# Patient Record
Sex: Female | Born: 1940 | Race: White | Hispanic: No | Marital: Married | Smoking: Former smoker
Health system: Southern US, Community
[De-identification: ages and names within clinical notes are randomized; demographics above are authoritative.]

## PROBLEM LIST (undated history)

## (undated) DIAGNOSIS — Z85828 Personal history of other malignant neoplasm of skin: Secondary | ICD-10-CM

## (undated) DIAGNOSIS — R51 Headache: Principal | ICD-10-CM

## (undated) DIAGNOSIS — K589 Irritable bowel syndrome without diarrhea: Secondary | ICD-10-CM

## (undated) DIAGNOSIS — Z8673 Personal history of transient ischemic attack (TIA), and cerebral infarction without residual deficits: Secondary | ICD-10-CM

## (undated) DIAGNOSIS — M1990 Unspecified osteoarthritis, unspecified site: Secondary | ICD-10-CM

## (undated) DIAGNOSIS — J310 Chronic rhinitis: Secondary | ICD-10-CM

## (undated) DIAGNOSIS — K5730 Diverticulosis of large intestine without perforation or abscess without bleeding: Secondary | ICD-10-CM

## (undated) DIAGNOSIS — H9190 Unspecified hearing loss, unspecified ear: Secondary | ICD-10-CM

## (undated) DIAGNOSIS — M479 Spondylosis, unspecified: Secondary | ICD-10-CM

## (undated) DIAGNOSIS — J45909 Unspecified asthma, uncomplicated: Secondary | ICD-10-CM

## (undated) DIAGNOSIS — J3089 Other allergic rhinitis: Secondary | ICD-10-CM

## (undated) DIAGNOSIS — IMO0002 Reserved for concepts with insufficient information to code with codable children: Secondary | ICD-10-CM

## (undated) DIAGNOSIS — J329 Chronic sinusitis, unspecified: Secondary | ICD-10-CM

## (undated) DIAGNOSIS — I10 Essential (primary) hypertension: Secondary | ICD-10-CM

## (undated) DIAGNOSIS — Z87442 Personal history of urinary calculi: Secondary | ICD-10-CM

## (undated) DIAGNOSIS — J302 Other seasonal allergic rhinitis: Secondary | ICD-10-CM

## (undated) DIAGNOSIS — I680 Cerebral amyloid angiopathy: Secondary | ICD-10-CM

## (undated) DIAGNOSIS — I6529 Occlusion and stenosis of unspecified carotid artery: Secondary | ICD-10-CM

## (undated) DIAGNOSIS — N201 Calculus of ureter: Secondary | ICD-10-CM

## (undated) DIAGNOSIS — R350 Frequency of micturition: Secondary | ICD-10-CM

## (undated) DIAGNOSIS — F329 Major depressive disorder, single episode, unspecified: Secondary | ICD-10-CM

## (undated) DIAGNOSIS — B029 Zoster without complications: Secondary | ICD-10-CM

## (undated) DIAGNOSIS — F32A Depression, unspecified: Secondary | ICD-10-CM

## (undated) DIAGNOSIS — H409 Unspecified glaucoma: Secondary | ICD-10-CM

## (undated) DIAGNOSIS — R413 Other amnesia: Secondary | ICD-10-CM

## (undated) HISTORY — DX: Unspecified osteoarthritis, unspecified site: M19.90

## (undated) HISTORY — DX: Essential (primary) hypertension: I10

## (undated) HISTORY — DX: Other amnesia: R41.3

## (undated) HISTORY — DX: Depression, unspecified: F32.A

## (undated) HISTORY — DX: Chronic rhinitis: J31.0

## (undated) HISTORY — DX: Chronic sinusitis, unspecified: J32.9

## (undated) HISTORY — DX: Unspecified hearing loss, unspecified ear: H91.90

## (undated) HISTORY — DX: Cerebral amyloid angiopathy: I68.0

## (undated) HISTORY — PX: BUNIONECTOMY: SHX129

## (undated) HISTORY — PX: TONSILECTOMY, ADENOIDECTOMY, BILATERAL MYRINGOTOMY AND TUBES: SHX2538

## (undated) HISTORY — PX: EXTRACORPOREAL SHOCK WAVE LITHOTRIPSY: SHX1557

## (undated) HISTORY — PX: CALDWELL LUC: SHX1284

## (undated) HISTORY — DX: Irritable bowel syndrome, unspecified: K58.9

## (undated) HISTORY — DX: Major depressive disorder, single episode, unspecified: F32.9

## (undated) HISTORY — DX: Headache: R51

## (undated) HISTORY — PX: OTHER SURGICAL HISTORY: SHX169

## (undated) HISTORY — PX: HERNIA REPAIR: SHX51

## (undated) HISTORY — PX: EYE SURGERY: SHX253

---

## 1973-09-16 HISTORY — PX: PERCUTANEOUS NEPHROSTOLITHOTOMY: SHX2207

## 1989-09-16 HISTORY — PX: VAGINAL HYSTERECTOMY: SUR661

## 1998-01-23 ENCOUNTER — Ambulatory Visit (HOSPITAL_COMMUNITY): Admission: RE | Admit: 1998-01-23 | Discharge: 1998-01-23 | Payer: Self-pay | Admitting: Urology

## 1998-02-16 ENCOUNTER — Ambulatory Visit (HOSPITAL_COMMUNITY): Admission: RE | Admit: 1998-02-16 | Discharge: 1998-02-16 | Payer: Self-pay | Admitting: Urology

## 2000-12-24 ENCOUNTER — Encounter: Payer: Self-pay | Admitting: *Deleted

## 2000-12-24 ENCOUNTER — Encounter (INDEPENDENT_AMBULATORY_CARE_PROVIDER_SITE_OTHER): Payer: Self-pay | Admitting: Specialist

## 2000-12-24 ENCOUNTER — Ambulatory Visit (HOSPITAL_BASED_OUTPATIENT_CLINIC_OR_DEPARTMENT_OTHER): Admission: RE | Admit: 2000-12-24 | Discharge: 2000-12-24 | Payer: Self-pay | Admitting: Urology

## 2001-06-09 ENCOUNTER — Ambulatory Visit (HOSPITAL_BASED_OUTPATIENT_CLINIC_OR_DEPARTMENT_OTHER): Admission: RE | Admit: 2001-06-09 | Discharge: 2001-06-09 | Payer: Self-pay | Admitting: Urology

## 2001-06-09 ENCOUNTER — Encounter: Payer: Self-pay | Admitting: Urology

## 2001-09-23 ENCOUNTER — Encounter: Admission: RE | Admit: 2001-09-23 | Discharge: 2001-09-23 | Payer: Self-pay | Admitting: Family Medicine

## 2001-09-23 ENCOUNTER — Encounter: Payer: Self-pay | Admitting: Family Medicine

## 2002-01-20 ENCOUNTER — Ambulatory Visit (HOSPITAL_COMMUNITY): Admission: RE | Admit: 2002-01-20 | Discharge: 2002-01-20 | Payer: Self-pay | Admitting: Gastroenterology

## 2002-01-20 ENCOUNTER — Encounter (INDEPENDENT_AMBULATORY_CARE_PROVIDER_SITE_OTHER): Payer: Self-pay | Admitting: Specialist

## 2002-09-30 ENCOUNTER — Encounter: Payer: Self-pay | Admitting: Neurological Surgery

## 2002-09-30 ENCOUNTER — Inpatient Hospital Stay (HOSPITAL_COMMUNITY): Admission: RE | Admit: 2002-09-30 | Discharge: 2002-10-02 | Payer: Self-pay | Admitting: Neurological Surgery

## 2002-09-30 HISTORY — PX: LUMBAR LAMINECTOMY: SHX95

## 2002-10-02 ENCOUNTER — Emergency Department (HOSPITAL_COMMUNITY): Admission: EM | Admit: 2002-10-02 | Discharge: 2002-10-03 | Payer: Self-pay | Admitting: Emergency Medicine

## 2002-10-03 ENCOUNTER — Encounter: Payer: Self-pay | Admitting: Neurosurgery

## 2003-02-09 ENCOUNTER — Observation Stay (HOSPITAL_COMMUNITY): Admission: AD | Admit: 2003-02-09 | Discharge: 2003-02-10 | Payer: Self-pay | Admitting: Urology

## 2003-02-09 HISTORY — PX: OTHER SURGICAL HISTORY: SHX169

## 2003-06-03 ENCOUNTER — Other Ambulatory Visit: Admission: RE | Admit: 2003-06-03 | Discharge: 2003-06-03 | Payer: Self-pay | Admitting: Family Medicine

## 2004-05-29 ENCOUNTER — Ambulatory Visit (HOSPITAL_BASED_OUTPATIENT_CLINIC_OR_DEPARTMENT_OTHER): Admission: RE | Admit: 2004-05-29 | Discharge: 2004-05-29 | Payer: Self-pay | Admitting: Urology

## 2004-09-12 ENCOUNTER — Ambulatory Visit (HOSPITAL_COMMUNITY): Admission: RE | Admit: 2004-09-12 | Discharge: 2004-09-12 | Payer: Self-pay | Admitting: Urology

## 2005-01-10 ENCOUNTER — Encounter: Admission: RE | Admit: 2005-01-10 | Discharge: 2005-01-10 | Payer: Self-pay | Admitting: Internal Medicine

## 2005-01-12 ENCOUNTER — Observation Stay (HOSPITAL_COMMUNITY): Admission: RE | Admit: 2005-01-12 | Discharge: 2005-01-12 | Payer: Self-pay | Admitting: General Surgery

## 2005-01-12 ENCOUNTER — Encounter (INDEPENDENT_AMBULATORY_CARE_PROVIDER_SITE_OTHER): Payer: Self-pay | Admitting: *Deleted

## 2005-01-12 HISTORY — PX: LAPAROSCOPIC CHOLECYSTECTOMY: SUR755

## 2005-02-06 ENCOUNTER — Encounter: Admission: RE | Admit: 2005-02-06 | Discharge: 2005-02-06 | Payer: Self-pay | Admitting: Internal Medicine

## 2005-02-15 ENCOUNTER — Encounter: Admission: RE | Admit: 2005-02-15 | Discharge: 2005-02-15 | Payer: Self-pay | Admitting: Internal Medicine

## 2005-06-25 ENCOUNTER — Ambulatory Visit (HOSPITAL_BASED_OUTPATIENT_CLINIC_OR_DEPARTMENT_OTHER): Admission: RE | Admit: 2005-06-25 | Discharge: 2005-06-25 | Payer: Self-pay | Admitting: Urology

## 2005-06-25 ENCOUNTER — Ambulatory Visit (HOSPITAL_COMMUNITY): Admission: RE | Admit: 2005-06-25 | Discharge: 2005-06-25 | Payer: Self-pay | Admitting: Urology

## 2005-08-30 ENCOUNTER — Ambulatory Visit: Payer: Self-pay | Admitting: Internal Medicine

## 2005-10-21 ENCOUNTER — Ambulatory Visit: Payer: Self-pay | Admitting: Internal Medicine

## 2006-01-11 ENCOUNTER — Emergency Department (HOSPITAL_COMMUNITY): Admission: EM | Admit: 2006-01-11 | Discharge: 2006-01-12 | Payer: Self-pay | Admitting: Emergency Medicine

## 2006-03-04 ENCOUNTER — Encounter: Admission: RE | Admit: 2006-03-04 | Discharge: 2006-03-04 | Payer: Self-pay | Admitting: Family Medicine

## 2006-05-13 ENCOUNTER — Ambulatory Visit (HOSPITAL_BASED_OUTPATIENT_CLINIC_OR_DEPARTMENT_OTHER): Admission: RE | Admit: 2006-05-13 | Discharge: 2006-05-13 | Payer: Self-pay | Admitting: Urology

## 2006-06-17 ENCOUNTER — Ambulatory Visit (HOSPITAL_COMMUNITY): Admission: RE | Admit: 2006-06-17 | Discharge: 2006-06-17 | Payer: Self-pay | Admitting: Family Medicine

## 2006-09-17 ENCOUNTER — Ambulatory Visit: Payer: Self-pay | Admitting: Internal Medicine

## 2006-09-19 ENCOUNTER — Ambulatory Visit: Payer: Self-pay | Admitting: Internal Medicine

## 2007-03-12 ENCOUNTER — Ambulatory Visit (HOSPITAL_BASED_OUTPATIENT_CLINIC_OR_DEPARTMENT_OTHER): Admission: RE | Admit: 2007-03-12 | Discharge: 2007-03-12 | Payer: Self-pay | Admitting: Urology

## 2007-04-27 ENCOUNTER — Encounter: Admission: RE | Admit: 2007-04-27 | Discharge: 2007-04-27 | Payer: Self-pay | Admitting: Family Medicine

## 2007-06-02 ENCOUNTER — Ambulatory Visit (HOSPITAL_BASED_OUTPATIENT_CLINIC_OR_DEPARTMENT_OTHER): Admission: RE | Admit: 2007-06-02 | Discharge: 2007-06-02 | Payer: Self-pay | Admitting: Urology

## 2007-06-02 HISTORY — PX: OTHER SURGICAL HISTORY: SHX169

## 2007-08-04 ENCOUNTER — Ambulatory Visit: Payer: Self-pay | Admitting: Internal Medicine

## 2007-08-12 ENCOUNTER — Telehealth: Payer: Self-pay | Admitting: Internal Medicine

## 2007-09-03 DIAGNOSIS — J209 Acute bronchitis, unspecified: Secondary | ICD-10-CM | POA: Insufficient documentation

## 2007-09-03 DIAGNOSIS — J3089 Other allergic rhinitis: Secondary | ICD-10-CM

## 2007-09-03 DIAGNOSIS — J302 Other seasonal allergic rhinitis: Secondary | ICD-10-CM

## 2007-09-04 ENCOUNTER — Ambulatory Visit: Payer: Self-pay | Admitting: Internal Medicine

## 2007-09-04 DIAGNOSIS — J018 Other acute sinusitis: Secondary | ICD-10-CM | POA: Insufficient documentation

## 2007-10-23 ENCOUNTER — Ambulatory Visit: Payer: Self-pay | Admitting: Internal Medicine

## 2008-05-04 ENCOUNTER — Encounter: Admission: RE | Admit: 2008-05-04 | Discharge: 2008-05-04 | Payer: Self-pay | Admitting: Family Medicine

## 2008-06-29 ENCOUNTER — Ambulatory Visit: Payer: Self-pay | Admitting: Urology

## 2008-07-01 ENCOUNTER — Ambulatory Visit: Payer: Self-pay | Admitting: Urology

## 2008-07-13 ENCOUNTER — Ambulatory Visit: Payer: Self-pay | Admitting: Urology

## 2008-07-19 ENCOUNTER — Ambulatory Visit: Payer: Self-pay | Admitting: Urology

## 2008-08-16 ENCOUNTER — Telehealth: Payer: Self-pay | Admitting: Internal Medicine

## 2008-08-31 ENCOUNTER — Ambulatory Visit: Payer: Self-pay | Admitting: Urology

## 2008-10-07 ENCOUNTER — Ambulatory Visit: Payer: Self-pay

## 2008-10-10 ENCOUNTER — Ambulatory Visit: Payer: Self-pay | Admitting: Urology

## 2008-10-12 ENCOUNTER — Ambulatory Visit: Payer: Self-pay | Admitting: Urology

## 2008-10-14 ENCOUNTER — Ambulatory Visit: Payer: Self-pay | Admitting: Urology

## 2008-11-11 ENCOUNTER — Ambulatory Visit: Payer: Self-pay | Admitting: Internal Medicine

## 2008-12-28 ENCOUNTER — Encounter: Admission: RE | Admit: 2008-12-28 | Discharge: 2008-12-28 | Payer: Self-pay | Admitting: Gastroenterology

## 2009-03-08 ENCOUNTER — Ambulatory Visit: Payer: Self-pay | Admitting: Urology

## 2009-03-15 ENCOUNTER — Ambulatory Visit: Payer: Self-pay | Admitting: Urology

## 2009-03-29 ENCOUNTER — Ambulatory Visit (HOSPITAL_COMMUNITY): Admission: RE | Admit: 2009-03-29 | Discharge: 2009-03-29 | Payer: Self-pay | Admitting: Urology

## 2009-04-28 ENCOUNTER — Ambulatory Visit (HOSPITAL_COMMUNITY): Admission: RE | Admit: 2009-04-28 | Discharge: 2009-04-28 | Payer: Self-pay | Admitting: Urology

## 2009-07-10 ENCOUNTER — Ambulatory Visit: Payer: Self-pay | Admitting: Internal Medicine

## 2009-07-13 ENCOUNTER — Telehealth (INDEPENDENT_AMBULATORY_CARE_PROVIDER_SITE_OTHER): Payer: Self-pay | Admitting: *Deleted

## 2009-07-21 ENCOUNTER — Encounter: Admission: RE | Admit: 2009-07-21 | Discharge: 2009-07-21 | Payer: Self-pay | Admitting: Family Medicine

## 2009-10-23 ENCOUNTER — Telehealth: Payer: Self-pay | Admitting: Internal Medicine

## 2009-10-23 ENCOUNTER — Ambulatory Visit: Payer: Self-pay | Admitting: Internal Medicine

## 2009-11-08 ENCOUNTER — Ambulatory Visit (HOSPITAL_BASED_OUTPATIENT_CLINIC_OR_DEPARTMENT_OTHER): Admission: RE | Admit: 2009-11-08 | Discharge: 2009-11-08 | Payer: Self-pay | Admitting: Urology

## 2010-01-19 ENCOUNTER — Ambulatory Visit (HOSPITAL_BASED_OUTPATIENT_CLINIC_OR_DEPARTMENT_OTHER): Admission: RE | Admit: 2010-01-19 | Discharge: 2010-01-19 | Payer: Self-pay | Admitting: Urology

## 2010-03-26 ENCOUNTER — Ambulatory Visit: Payer: Self-pay | Admitting: Internal Medicine

## 2010-03-26 ENCOUNTER — Telehealth (INDEPENDENT_AMBULATORY_CARE_PROVIDER_SITE_OTHER): Payer: Self-pay | Admitting: *Deleted

## 2010-03-30 ENCOUNTER — Telehealth (INDEPENDENT_AMBULATORY_CARE_PROVIDER_SITE_OTHER): Payer: Self-pay | Admitting: *Deleted

## 2010-07-25 ENCOUNTER — Encounter: Admission: RE | Admit: 2010-07-25 | Discharge: 2010-07-25 | Payer: Self-pay | Admitting: Family Medicine

## 2010-10-06 ENCOUNTER — Encounter: Payer: Self-pay | Admitting: Family Medicine

## 2010-10-07 ENCOUNTER — Encounter: Payer: Self-pay | Admitting: Urology

## 2010-10-08 ENCOUNTER — Encounter: Payer: Self-pay | Admitting: Family Medicine

## 2010-10-16 NOTE — Assessment & Plan Note (Signed)
Summary: cough,wheeze, drainage/alr   Primary Provider/Referring Provider:  Arvilla Market  CC:  c/o cough, drainage lt yellow sputum, and chest tight. .  History of Present Illness: 11/11/08- Asthma, allergic rhinitis Planning trip to altitude in [place] and asks to restock meds, including some Cipro and diamox to have on hand. One bronchits episode treated this winter with prednisone and zith. Notices sneeze and morning cough. Helped to add a humidifier.   30-Jul-2009-  Asthma, Allergic rhinitis Her trip to [place] went very well- didn't need her inhaler. Just got  back from a group tour to [place] and they all got bronchitis. She self-treated with 8 days of Cipro. When she stopped cough came back. She restarted cipro and now is getting cough, as well as yeast. Initially yellow mucus, then clear. She has had most of a week of cipro now in second round after first starting 2 weeks ago. Has taken Airborn. Has frontal hedaches, no fever or pain or wheeze.  October 23, 2009- Asthma, Allergic rhinitis About to lead a tour group. Had bronchitis syndrome with brown sputum. She is getiting much better, clearing on amoxacillin, but now she has started to wheeze.She asks depo to help while out of country. Denies fever, chest pain, blood, nodes, edema.  March 26, 2010- Asthma, Allergic rhinitis She has coughed a little all winter. She is coughing worse in last few days with some daily cough getting worse, scant sputum, nothing purulent. We gave some prednisone to use in February. She drifts off steorid inhalers when doing well. We discussed the maintenance inhalers again.She realizes she needs to sit down for a review of chonic management when she gets back from pending trip to [place].    Preventive Screening-Counseling & Management  Alcohol-Tobacco     Smoking Status: quit  Current Medications (verified): 1)  Estradiol .... Take 1 Tablet By Mouth Once A Day 2)  Desipramine Hcl 25 Mg  Tabs (Desipramine  Hcl) .... Take 1 Tablet By Mouth Once A Day 3)  Proventil Hfa 108 (90 Base) Mcg/act Aers (Albuterol Sulfate) .... 2 Puffs  Every 4 Hours As Needed 4)  Aleve 220 Mg  Tabs (Naproxen Sodium) .... As Needed 5)  Celebrex .... As Needed 6)  Prednisone 5 Mg Tabs (Prednisone) .... 4 X 2 Days, 3 X 2 Days, 2 X 2 Days, 1 X 2 Days. Repeat If Needed.  Allergies (verified): No Known Drug Allergies  Past History:  Past Surgical History: Last updated: 10/23/2007 lumbar laminectomy [place]- luc sinus drainage procedure  Family History: Last updated: 30-Jul-2009 Father- died WWII Mother- died COPD, smoker  Social History: Last updated: 2009/07/30 Patient states former smoker- quit qge 45. 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  The patient denies non-productive cough, coughing up blood, chest pain, irregular heartbeats, acid heartburn, indigestion, loss of appetite, weight change, abdominal pain, difficulty swallowing, sore throat, tooth/dental problems, headaches, nasal congestion/difficulty breathing through nose, and sneezing.    Vital Signs:  Patient profile:   70 year old female Height:      63 inches Weight:      156 pounds BMI:     27.73 O2  Sat:      99 % on Room air Temp:     97.4 degrees F oral Pulse rate:   104 / minute BP sitting:   140 / 80  (right arm) Cuff size:   regular  Vitals Entered By: Kandice Hams CMA (March 26, 2010 4:52 PM)  O2 Flow:  Room air CC: c/o cough,drainage lt yellow sputum,chest tight.    Physical Exam  Additional Exam:  General: A/Ox3; pleasant and cooperative, NAD, SKIN: no rash, lesions NODES: no  lymphadenopathy HEENT: [place]/AT, EOM- WNL, Conjuctivae- clear, PERRLA, TM-WNL, Nose- clear, Throat- clear and wnl, hoarse NECK: Supple w/ fair ROM, JVD- none, normal carotid impulses w/o bruits CHEST: Clear to P&A, very clear, but dry cough after deep breath, only trace wheeze. HEART: RRR, no m/g/r heard ZOX:WRUE, nl pulses, no edema  NEURO: Grossly intact to observation      Impression & Recommendations:  Problem # 1:  ASTHMATIC BRONCHITIS, ACUTE (ICD-466.0)  We will schedule PFT for when se gets back. For now, to get her to [place] and back, we will get neb and depo, then let her carry enough prednisone to stabilize her.  Her updated medication list for this problem includes:    Proventil Hfa 108 (90 Base) Mcg/act Aers (Albuterol sulfate) .Marland Kitchen... 2 puffs  every 4 hours as needed    Promethazine-codeine 6.25-10 Mg/78ml Syrp (Promethazine-codeine) .Marland Kitchen... 1 teaspoon four times a day as needed cough  Problem # 2:  ALLERGIC RHINITIS (ICD-477.9) Little nasal discharge or congestion now.  Medications Added to Medication List This Visit: 1)  [place]  .Marland Kitchen.. 1 by mouth once daily 2)  Promethazine-codeine 6.25-10 Mg/8ml Syrp (Promethazine-codeine) .Marland Kitchen.. 1 teaspoon four times a day as needed cough  Other Orders: Est. Patient Level III (45409) Nebulizer Tx (81191) Depo- Medrol 80mg  (J1040) Admin of Therapeutic Inj  intramuscular or subcutaneous (47829)  Patient Instructions: 1)  Schedule return 6 weeks 2)  Schedule PFT 3)  Scripts for prednisone and rescue inhaler sent to your drug store 4)  depo 80 5)  neb xop 1.25 Prescriptions: PROMETHAZINE-CODEINE 6.25-10 MG/5ML SYRP (PROMETHAZINE-CODEINE) 1 teaspoon four times a day as needed cough  #200 ml x 0   Entered and Authorized by:   Waymon Budge MD   Signed by:   Waymon Budge MD on 03/26/2010   Method used:   Print then Give to Patient   RxID:   [id number] PREDNISONE 5 MG TABS (PREDNISONE) 4 x 2 days, 3 x 2 days, 2 x 2 days, 1 x 2  days. Repeat if needed.  #100 x 0   Entered and Authorized by:   Waymon Budge MD   Signed by:   Waymon Budge MD on 03/26/2010   Method used:   [facility] to        [facility] [address] [phone]* (retail)       [address]       [place], [place]  [address]       Ph: [phone] or [phone]       Fax: [phone]   RxID:   [id number] PROVENTIL HFA 108 (90 BASE) MCG/ACT AERS (ALBUTEROL SULFATE) 2 puffs  every 4 hours as needed  #2 x prn   Entered and Authorized by:   Waymon Budge MD   Signed by:   Waymon Budge MD on 03/26/2010   Method used:   [facility]ron[facility] to  [facility] [address] [phone]* (retail)       [address]       [place], [place]  [address]       Ph: [phone] or [phone]       Fax: [phone]   RxID:   [id number]    Medication Administration  Injection # 1:    Medication: Depo- Medrol 80mg     Diagnosis: ASTHMATIC BRONCHITIS, ACUTE (ICD-466.0)    Route: IM    Site: LUOQ gluteus    Exp Date: 04/23/2010    Lot #: [id number]    Mfr: Pharmacia    Patient tolerated injection without complications    Given by: Kandice Hams CMA (March 26, 2010 5:53 PM)  Medication # 1:    Medication: Xopenex 1.25mg     Diagnosis: ASTHMATIC BRONCHITIS, ACUTE (ICD-466.0)    Dose: 1 vial    Route: inhaled    Exp Date: 04/17/2011    Lot #: [id number]    Mfr: sepracor    Patient tolerated medication without complications    Given by: Kandice Hams CMA (March 26, 2010 5:42 PM)  Orders Added: 1)  Est. Patient Level III [75643] 2)  Nebulizer Tx [32951] 3)  Depo- Medrol 80mg  [J1040] 4)  Admin of Therapeutic Inj  intramuscular or subcutaneous [88416]

## 2010-10-16 NOTE — Progress Notes (Signed)
Summary: rx  Phone Note Call from Patient Call back at Home Phone [phone]   Caller: Patient Call For: Daria Mcmeekin Summary of Call: asthma, wheezing, need something to get rid of this.  Going on long trip Thursday, need to get something or be seen today. Initial call taken by: Eugene Gavia,  October 23, 2009 9:43 AM  Follow-up for Phone Call        Pt c/o wheezing and productive cough with light yellow mucus. Pt states is being treated with Amox 875/125mg  two times a day by Primary Care Provider. Pt states sputum started off brown to deep yellow to light yellow.  Pt requesting to come in to be seen by CY for a "Prednisone shot" before she goes into an "asthma flare". Please advise. Zackery Barefoot CMA  October 23, 2009 11:34 AM   NKDA  Additional Follow-up for Phone Call Additional follow up Details #1::        Please add pt on 430;)will see CDY for injection.Reynaldo Minium CMA  October 23, 2009 1:48 PM   PT NEEDS TO BE HERE AT 4pm.Katie Welchel CMA  October 23, 2009 1:50 PM   pt schedueled for 4:30. Carron Curie CMA  October 23, 2009 2:19 PM

## 2010-10-16 NOTE — Progress Notes (Signed)
Summary: sick- req appt w/ CDY-LMTCB x 2  Phone Note Call from Patient Call back at Home Phone [phone]   Caller: Patient Call For: young Reason for Call: Talk to Nurse Summary of Call: pt going out of country - really needs to be seen.  cough, wheezing, plegm, drainage and pain.  Wants to be seen.  Offered her Friday, but not soon enough.  Please advise Initial call taken by: Eugene Gavia,  March 26, 2010 9:27 AM  Follow-up for Phone Call        called and spoke with pt.  pt would like to see CY this week before friday.  Pt states she leaves next week for [place] x 2 weeks.  Pt states symptoms started yesterday.  Pt c/o coughing up yellow sputum, increased wheezing, and sob.  Pt believes she is having a flare up of her asthma.  Pt denied fever.  Pt would like to come in to be seen before she leaves. No appts currently avail on CY's schedule.   Please advise.  Thanks. Aundra Millet Reynolds LPN  March 26, 2010 10:22 AM   NKDA  Additional Follow-up for Phone Call Additional follow up Details #1::        called pt got VM, left msg offered appt thispm at 4:15.  Call back in can take this appt .Kandice Hams [facility]  March 26, 2010 12:18 PM  Called pt back to offer this appt but NA- LMTCB Vernie Murders  March 26, 2010 1:41 PM  Additional Follow-up by: Kandice Hams CMA,  March 26, 2010 12:18 PM    Additional Follow-up for Phone Call Additional follow up Details #2::    appt scheduled for 4:15 today with cy Follow-up by: Philipp Deputy CMA,  March 26, 2010 4:22 PM

## 2010-10-16 NOTE — Assessment & Plan Note (Signed)
Summary: per katie//td   Primary Provider/Referring Provider:  Arvilla Market  CC:  Accute visit-cough-prod-yellow; wheezing; requests Depo Medrol Injection as she is heading out of state Thursday.Marland Kitchen  History of Present Illness: History of Present Illness: 10/23/07- Better in [place]. Nasal congestion usually starts as heat comes on , esp with virus infections, lasting with ropey clear mucus until May. Coughs more in house. Can relate to indoor heat and occ plug reflecting old sinus surgery. No headaches, sneezing, purulent discharge or wheeze/ cough.  11/11/08- Asthma, allergic rhinitis Planning trip to altitude in [place] and asks to restock meds, including some Cipro and diamox to have on hand. One bronchits episode treated this winter with prednisone and zith. Notices sneeze and morning cough. Helped to add a humidifier.   July 22, 2009-  Asthma, Allergic rhinitis Her trip to [place] went very well- didn't need her inhaler. Just got  back from a group tour to [place] and they all got bronchitis. She self-treated with 8 days of Cipro. When she stopped cough came back. She restarted cipro and now is getting cough, as well as yeast. Initially yellow mucus, then clear. She has had most of a week of cipro now in second round after first starting 2 weeks ago. Has taken Airborn. Has frontal hedaches, no fever or pain or wheeze.  October 23, 2009- Asthma, Allergic rhinitis About to lead a tour group. Had bronchitis syndrome with brown sputum. She is getiting much better, clearing on amoxacillin, but now she has started to wheeze.She asks depo to help while out of country. Denies fever, chest pain, blood, nodes, edema.   Current Medications (verified): 1)  Estradiol .... Take 1 Tablet By Mouth Once A Day 2)  Desipramine Hcl 25 Mg  Tabs (Desipramine Hcl) .... Take 1 Tablet By Mouth Once A Day 3)  Proventil Hfa 108 (90 Base) Mcg/act Aers (Albuterol Sulfate) .... 2 Puffs  Every 4 Hours As Needed 4)  Aleve 220 Mg   Tabs (Naproxen Sodium) .... As Needed 5)  Celebrex .... 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    Vital Signs:  Patient profile:   70 year old female Height:      63 inches Weight:      156.25 pounds BMI:     27.78 O2 Sat:      97 % on Room air Pulse rate:   90 / minute BP sitting:   152 / 88  (left arm) Cuff size:   regular  Vitals Entered By: Reynaldo Minium CMA (October 23, 2009 4:44 PM)  O2 Flow:  Room air  Physical Exam  Additional Exam:  General: A/Ox3; pleasant and cooperative, NAD, SKIN: no rash, lesions NODES: no lymphadenopathy HEENT: /AT, EOM- WNL, Conjuctivae- clear, PERRLA, TM-WNL, Nose- clear, Throat- clear and wnl, hoarse NECK:  Supple w/ fair ROM, JVD- none, normal carotid impulses w/o bruits CHEST: Clear to P&A, very clear, but dry cough after depo breath HEART: RRR, no m/g/r heard VWU:JWJX, nl pulses, no edema  NEURO: Grossly intact to observation      Impression & Recommendations:  Problem # 1:  ASTHMATIC BRONCHITIS, ACUTE (ICD-466.0)  Sequential viral respiratory infections. We need to get her ready for her trip. We will give a  neb and depo today, then prednisone in format she requests to carry with her. She will finish the antibiotic. The following medications were removed from the medication list:    Zithromax Z-pak 250 Mg Tabs (Azithromycin) .Marland Kitchen... 2 today then one daily Her updated medication list for this problem includes:    Proventil Hfa 108 (90 Base) Mcg/act Aers (Albuterol sulfate) .Marland Kitchen... 2 puffs  every 4 hours as needed  Medications Added to Medication List This Visit: 1)  Prednisone 5 Mg Tabs (Prednisone) .... 4 x 2 days, 3 x 2 days, 2 x 2 days, 1 x 2 days. repeat if needed. 2)  Fluconazole 150 Mg Tabs (Fluconazole) .Marland Kitchen.. 1 daily for yeast  Other Orders: Est. Patient Level II (91478)  Patient Instructions: 1)  Return as scheduled- call as needed 2)  neb xop 1.25 3)  depo 80 4)  Scripts to your drug store Prescriptions: FLUCONAZOLE 150 MG TABS (FLUCONAZOLE) 1 daily for yeast  #5 x 0   Entered and Authorized by:   Waymon Budge MD   Signed by:   Waymon Budge MD on 10/23/2009   Method used:   Electronically to        [facility] [address] [phone]* (retail)       [address]       [place], [place]  [address]       Ph: [phone] or [phone]       Fax: [phone]   RxID:   [id number] PREDNISONE 5 MG TABS (PREDNISONE) 4 x 2 days, 3 x 2 days, 2 x 2 days, 1 x 2 days. Repeat if needed.  #40 x 0   Entered and Authorized by:   Waymon Budge MD   Signed by:   Waymon Budge MD on 10/23/2009   Method used:   Electronically to        [facility] [address] [phone]* (retail)       [address]       [place], [place]  [address]       Ph: [phone] or [phone]       Fax: [phone]   RxID:   [id number]

## 2010-10-16 NOTE — Progress Notes (Signed)
Summary: talk to nurse  Phone Note Call from Patient   Caller: Patient Call For: young Reason for Call: Talk to Nurse Summary of Call: Phlegm has turned yellow, needs an antibiotic, pls advise.//[facility] Initial call taken by: Darletta Moll,  March 30, 2010 11:44 AM  Follow-up for Phone Call        called and spoke with pt.  pt was just seen by CY on 03-26-2010.  Pt states at that time her sputum was clear and was therefore given rx for Pred taper and Promethazine w/ Codeine.  Pt also got Depo inj and Neb tx while at visit.  Pt states her sputum she is coughing up has now turned dark yellow.  Pt states she hasn't checked her temp but states she "feels warm."  Pt denied any changes in breathing.  Pt requests rx for abx- prefers Amoxicillin.  Please advise.  Pt leaves for [place] next week.  Thanks.   Aundra Millet Reynolds LPN  March 30, 2010 11:48 AM    NKDA  Additional Follow-up for Phone Call Additional follow up Details #1::        Ok Amoxacillin 500 mg, # 21, 1 three times a day  Additional Follow-up by: Waymon Budge MD,  March 30, 2010 1:26 PM    Additional Follow-up for Phone Call Additional follow up Details #2::    Rx for amoxcicillin was sent.  Spoke with pt and advised this has been done.   Follow-up by: Vernie Murders,  March 30, 2010 1:29 PM  New/Updated Medications: AMOXICILLIN 500 MG CAPS (AMOXICILLIN) 1 by mouth three times a day Prescriptions: AMOXICILLIN 500 MG CAPS (AMOXICILLIN) 1 by mouth three times a day  #21 x 0   Entered by:   Vernie Murders   Authorized by:   Waymon Budge MD   Signed by:   Vernie Murders on 03/30/2010   Method used:   Electronically to        [facility] [address] [phone]* (retail)       [address]       [place], [place]  [address]       Ph: [phone] or [phone]       Fax: [phone]   RxID:   [id number]

## 2010-10-26 ENCOUNTER — Telehealth (INDEPENDENT_AMBULATORY_CARE_PROVIDER_SITE_OTHER): Payer: Self-pay | Admitting: *Deleted

## 2010-11-01 NOTE — Progress Notes (Signed)
Summary: cough > zpak rx  Phone Note Call from Patient Call back at Home Phone [phone]   Caller: Patient Call For: Young Summary of Call: Spoke with pt.  She is requesting abx. C/o sore throat, prod cough with thick, greenish/brown sputum.  She states that her chest feels tight but she denies any CP.  She denies fever/chills/sweats-Please advise thanks! NKDA [facility] Initial call taken by: Vernie Murders,  October 26, 2010 10:08 AM  Follow-up for Phone Call        Per CDY-okay to give Zpak #1 take as directed no refills.Reynaldo Minium CMA  October 26, 2010 12:11 PM   Called, spoke with pt.  She was informed of above recs per CDY and verbalized understanding. Aware [facility]ak [facility].  Will call office back if sxs do not improve or worsen Follow-up by: Gweneth Dimitri RN,  October 26, 2010 12:28 PM    New/Updated Medications: ZITHROMAX Z-PAK 250 MG TABS (AZITHROMYCIN) take as directed Prescriptions: ZITHROMAX Z-PAK 250 MG TABS (AZITHROMYCIN) take as directed  #1 x 0   Entered by:   Gweneth Dimitri RN   Authorized by:   Waymon Budge MD   Signed by:   Gweneth Dimitri RN on 10/26/2010   Method used:   Electronically to        [facility] [address] [phone]* (retail)       [address]       [place], [place]  [address]       Ph: [phone] or [phone]       Fax: [phone]   RxID:   [id number]

## 2010-12-04 LAB — POCT I-STAT 4, (NA,K, GLUC, HGB,HCT)
Glucose, Bld: 100 mg/dL — ABNORMAL HIGH (ref 70–99)
HCT: 44 % (ref 36.0–46.0)
Hemoglobin: 15 g/dL (ref 12.0–15.0)
Potassium: 4 mEq/L (ref 3.5–5.1)
Sodium: 140 mEq/L (ref 135–145)

## 2010-12-05 LAB — POCT I-STAT 4, (NA,K, GLUC, HGB,HCT)
Glucose, Bld: 102 mg/dL — ABNORMAL HIGH (ref 70–99)
HCT: 47 % — ABNORMAL HIGH (ref 36.0–46.0)
Hemoglobin: 16 g/dL — ABNORMAL HIGH (ref 12.0–15.0)
Potassium: 4.3 mEq/L (ref 3.5–5.1)
Sodium: 137 mEq/L (ref 135–145)

## 2010-12-22 LAB — BASIC METABOLIC PANEL
BUN: 12 mg/dL (ref 6–23)
CO2: 28 mEq/L (ref 19–32)
Calcium: 9.3 mg/dL (ref 8.4–10.5)
Chloride: 104 mEq/L (ref 96–112)
Creatinine, Ser: 0.81 mg/dL (ref 0.4–1.2)
GFR calc Af Amer: >60 mL/min (ref >60)
GFR calc non Af Amer: >60 mL/min (ref >60)
Glucose, Bld: 104 mg/dL — ABNORMAL HIGH (ref 70–99)
Potassium: 4.6 mEq/L (ref 3.5–5.1)
Sodium: 137 mEq/L (ref 135–145)

## 2010-12-22 LAB — HEMOGLOBIN AND HEMATOCRIT, BLOOD: HCT: 44.8 % (ref 36.0–46.0)

## 2010-12-23 LAB — HEMOGLOBIN AND HEMATOCRIT, BLOOD
HCT: 44 % (ref 36.0–46.0)
Hemoglobin: 15 g/dL (ref 12.0–15.0)

## 2010-12-23 LAB — BASIC METABOLIC PANEL
Chloride: 103 mEq/L (ref 96–112)
GFR calc Af Amer: >60 mL/min (ref >60)
Potassium: 3.9 mEq/L (ref 3.5–5.1)
Sodium: 137 mEq/L (ref 135–145)

## 2011-01-03 ENCOUNTER — Other Ambulatory Visit: Payer: Self-pay | Admitting: Dermatology

## 2011-01-14 ENCOUNTER — Encounter: Payer: Self-pay | Admitting: Internal Medicine

## 2011-01-14 ENCOUNTER — Ambulatory Visit (INDEPENDENT_AMBULATORY_CARE_PROVIDER_SITE_OTHER): Payer: Medicare Other | Admitting: Internal Medicine

## 2011-01-14 VITALS — BP 124/80 | HR 87 | Ht 63.0 in | Wt 152.0 lb

## 2011-01-14 DIAGNOSIS — J018 Other acute sinusitis: Secondary | ICD-10-CM

## 2011-01-14 DIAGNOSIS — J309 Allergic rhinitis, unspecified: Secondary | ICD-10-CM

## 2011-01-14 MED ORDER — BENZONATATE 100 MG PO CAPS: 100.0000 mg | ORAL_CAPSULE | Freq: Four times a day (QID) | ORAL | Status: DC | PRN

## 2011-01-14 MED ORDER — MOMETASONE FUROATE 50 MCG/ACT NA SUSP: NASAL | Status: DC

## 2011-01-14 NOTE — Assessment & Plan Note (Signed)
Much of her current discomfort seems to be an allergic rhinitis with drainage. We will point her to an antihistamine, nasal steroid spray and cough medicine. We will bring her back for allergy testing.

## 2011-01-14 NOTE — Patient Instructions (Addendum)
Try otc antihistamine : allegra/ fexofenadine 180/ 24 hr   As needed for drainage  Scripts printed for nasonex maintenance steroid nasal spray         And for           Benzonatate for cough    Schedule return for allergy skin testing at your convenience-    Antihistamines block the testing, so you will need to be off all cough and cold meds, cough syrups and otc sleep meds for 3 days before the test.

## 2011-01-14 NOTE — Progress Notes (Signed)
  Subjective:    Patient ID: Ashley [name], female    DOB: 1941-02-19, 70 y.o.   MRN: [id number]  HPI 70 yo F former smoker followed for allergic rhinitis with hx asthmatic bronchitis and acute rhinosinusitis.  We called in a Z pak for a cold with sinus complaints- she cleared completely for a month then over last 6 weeks she has had persistent nasal congestion, rhinorhea and cough with light yellow phlegm. Valtrex helped for canker sores with her cold. She is taking an anticongestant, otc . Main complaints are watery eyes, cough and sneeze.   Review of Systems See HPI Constitutional:   No weight loss, night sweats,  Fevers, chills, fatigue, lassitude. HEENT:   No headaches,  Difficulty swallowing,  Tooth/dental problems,  Sore throat,             CV:  No chest pain,  Orthopnea, PND, swelling in lower extremities, anasarca, dizziness, palpitations  GI  No heartburn, indigestion, abdominal pain, nausea, vomiting, diarrhea, change in bowel habits, loss of appetite  Resp: No shortness of breath with exertion or at rest.  No excess mucus, no productive cough,  No non-productive cough,  No coughing up of blood.  No change in color of mucus.  No wheezing.  Skin: no rash or lesions.  GU: no dysuria, change in color of urine, no urgency or frequency.  No flank pain.  MS:  No joint pain or swelling.  No decreased range of motion.  No back pain.  Psych:  No change in mood or affect. No depression or anxiety.  No memory loss.      Objective:   Physical Exam General- Alert, Oriented, Affect-appropriate, Distress- none acute  Skin- rash-none, lesions- none, excoriation- none  Lymphadenopathy- none  Head- atraumatic  Eyes- Gross vision intact, PERRLA, conjunctivae clear secretions  Ears- Normal - Hearing, canals, Tm L ,R ,  Nose- , No- Septal dev, mucus, polyps, erosion, perforation---- but she is a little nasal and sniffing  Throat- Mallampati II , mucosa clear , drainage- none,  tonsils- atrophic  Neck- flexible , trachea midline, no stridor , thyroid nl, carotid no bruit  Chest - symmetrical excursion , unlabored     Heart/CV- RRR , no murmur , no gallop  , no rub, nl s1 s2                     - JVD- none , edema- none, stasis changes- none, varices- none     Lung- clear to P&A, wheeze- none, cough- none , dullness-none, rub- none     Chest wall-  Abd- tender-no, distended-no, bowel sounds-present, HSM- no  Br/ Gen/ Rectal- Not done, not indicated  Extrem- cyanosis- none, clubbing, none, atrophy- none, strength- nl  Neuro- grossly intact to observation         Assessment & Plan:

## 2011-01-17 ENCOUNTER — Encounter: Payer: Self-pay | Admitting: Internal Medicine

## 2011-01-17 NOTE — Assessment & Plan Note (Signed)
i doubt bacterial infection so far this time around, bout we discussed how this may evolve. She is trying to get clear for more international travel with a group.

## 2011-01-29 NOTE — Op Note (Signed)
NAMEJAYDALYN, Ashley [name]            ACCOUNT NO.:  [id number]   MEDICAL RECORD NO.:  [id number]          PATIENT TYPE:  AMB   LOCATION:  DAY                          FACILITY:  [facility]   PHYSICIAN:  Maretta Bees. Vonita Moss, M.D.DATE OF BIRTH:  Aug 16, 1941   DATE OF PROCEDURE:  04/28/2009  DATE OF DISCHARGE:                               OPERATIVE REPORT   PREOPERATIVE DIAGNOSIS:  Left ureteral stone.   POSTOPERATIVE DIAGNOSIS:  Left ureteral stone.   PROCEDURE:  1. Cystoscopy.  2. Left ureteroscopy.  3. Holmium laser fragmentation.  4. Stone basketing.   SURGEON:  Maretta Bees. Vonita Moss, M.D.   ANESTHESIA:  Spinal.   INDICATIONS:  This lady with a significant history of stone disease  presents with symptomatic 6 mm stone of the distal left ureter.  She has  been unable to pass this despite UroXatral.  She wished intervention at  this point.   PROCEDURE:  The patient was brought to the operating room and placed in  lithotomy position.  External genitalia were prepped and draped in usual  fashion.  She was cystoscoped and the bladder was unremarkable.  There  seemed to be a slight bulge in the intramural ureter.  I then inserted a  Sensor guidewire up the ureter and under fluoroscopy noticed that the  stone was pushed up into the pelvic ureter but the guidewire went up  easily.  I then removed the cystoscope and inserted the 6-French rigid  ureteroscope which easily traversed the intramural ureter.  I  encountered a yellow irregular stone.  I used a nitinol stone basket but  the stone was just slightly too large to remove intact.  Therefore, I  then used the holmium laser to fragment the stone into 5 pieces after  which I used the nitinol basket to remove all the residual fragments.  I  then the reinserted the ureteroscope and saw no residual stones. There  was no evidence of ureteral perforation or injury above and beyond the  edema that was already there at the beginning of the case due to  the  stone impaction.  I then removed the ureteroscope and the guidewire and  she was taken to the recovery room in good condition.  She was given  12.5 mg of Phenergan IV which tends to make her postoperative course  more comfortable.  There were no complications and she tolerated the  procedure well      Maretta Bees. Vonita Moss, M.D.  Electronically Signed     LJP/MEDQ  D:  04/28/2009  T:  04/28/2009  Job:  [id number]

## 2011-01-29 NOTE — Op Note (Signed)
Ashley [name], NAULT NO.:  [id number]   MEDICAL RECORD NO.:  [id number]          PATIENT TYPE:  AMB   LOCATION:  DAY                          FACILITY:  [facility]   PHYSICIAN:  Heloise Purpura, MD      DATE OF BIRTH:  05-29-1941   DATE OF PROCEDURE:  03/29/2009  DATE OF DISCHARGE:                               OPERATIVE REPORT   PREOPERATIVE DIAGNOSIS:  Left ureteral calculus.   POSTOPERATIVE DIAGNOSIS:  Left ureteral calculus.   PROCEDURE:  1. Cystoscopy.  2. Left retrograde pyelography.  3. Left ureteroscopy with laser lithotripsy and stone removal.   SURGEON:  Heloise Purpura, MD   ANESTHESIA:  LMA.   COMPLICATIONS:  None.   ESTIMATED BLOOD LOSS:  None.   SPECIMEN:  Left ureteral stone.   DISPOSITION:  Specimen to [facility] specialist for stone  analysis.   INDICATIONS:  [name] is a 70 year old female with a long  history of urolithiasis.  She recently developed left sided flank pain  and was found to have a left distal ureteral calculus.  After discussing  management options for treatment and after failing an attempt of  conservative medical management, she elected to proceed with the above  procedures.  The potential risks, complications, and alternative  treatment options were discussed in detail and informed consent was  obtained.   DESCRIPTION OF PROCEDURE:  The patient was taken to the operating room  and a general anesthetic was administered.  She was given preoperative  antibiotics, placed in the dorsal lithotomy position, and prepped and  draped in the usual sterile fashion.  Next, a preoperative timeout was  performed.  Cystourethroscopy was then performed which demonstrated a  normal urethra.  Systematic inspection of the bladder revealed no  evidence of any bladder tumors or stones.  There was noted to be edema  of the distal left ureteral orifice consistent with the patient's distal  left ureteral calculus.  A 6-French  ureteral catheter was then used to  intubate the left ureteral orifice and Omnipaque contrast was injected  which confirmed a filling defect within the distal left ureter.  A 0.038  Sensor guidewire was then advanced up the ureter into the renal pelvis.  A 6-French ureteroscope was then advanced next to the wire into the  distal left ureter without the need for ureteral dilation.  The  patient's stone was identified and utilizing the 365 micron fiber, the  stone was fragmented with the holmium laser at settings of 0.6 joules  and 6 hertz.  Once the stone was adequately fragmented, all visible  fragments were removed with a Nitinol basket.  Re-inspection of the  ureter revealed no further fragments within the left ureter.  It was not  felt that the patient would need a ureteral  stent and the wire was removed.  The patient's bladder was emptied and  the procedure was ended.  She tolerated the procedure well and without  complications.  She was able to be awakened and transferred to the  recovery unit in satisfactory condition.      Heloise Purpura, MD  Electronically  Signed     LB/MEDQ  D:  03/29/2009  T:  03/30/2009  Job:  [id number]

## 2011-01-29 NOTE — Op Note (Signed)
Ashley [name], Ashley [name]            ACCOUNT NO.:  [id number]   MEDICAL RECORD NO.:  [id number]          PATIENT TYPE:  AMB   LOCATION:  NESC                         FACILITY:  [facility]   PHYSICIAN:  Boston Service, M.D.DATE OF BIRTH:  01/28/1941   DATE OF PROCEDURE:  03/12/2007  DATE OF DISCHARGE:                               OPERATIVE REPORT   PREOPERATIVE DIAGNOSES:  Seven to eight millimeter right distal ureteral  calculus.   POSTOPERATIVE DIAGNOSES:  Seven to eight millimeter right distal  ureteral calculus.   PROCEDURE:  Cystoscopy, retrograde ureteroscopy, holmium laser  fragmentation of the stone and double-J stent placement.   SURGEON:  Boston Service, M.D.   ASSISTANT:  None.   ANESTHESIA:  General.   SPECIMENS:  Very small stony fragments 1-2 mm.   ESTIMATED BLOOD LOSS:  Minimal.   COMPLICATIONS:  None obvious.   DESCRIPTION OF PROCEDURE:  The patient was prepped and draped in the  dorsal lithotomy position after institution of adequate level of general  anesthesia.  A well lubricated 21-French panendoscope was gently  inserted at the urethral meatus.  Bladder showed mild descensus, clear  reflux left orifice, minimal efflux right orifice, erythema surrounding  the right ureteral orifice.   Blocking catheter was selected, retrograde films were performed.  The  patient has nonobstructive stones within the left kidney, ureter, pelvis  and calyces otherwise showed no filling defect and no obstruction.  Right retrograde showed 7 x 8 mm calcification with obstruction and  proximal hydronephrosis.  There are nonobstructive stones within the  right kidney but no other obstructing ureteral stones.  Guidewire was  negotiated beyond the stone.  Cystoscope was removed.  Ureteroscope was  inserted, advanced to the level of stone.  Fragmentation was initiated  using the 365 holmium fiber over a period of about 20-30 minutes.  Stone  was broken into fragments of 1 mm  or  less.  No attempt was made to extract the fragments.  Once complete  fragmentation had occurred, ureteroscope was withdrawn.  A 6-French 24  cm double-J stent was placed, appeared to be in good position  fluoroscopically.  The bladder was drained.  Cystoscope was removed.  The patient was returned to recovery in satisfactory condition.           ______________________________  Boston Service, M.D.     RH/MEDQ  D:  03/12/2007  T:  03/12/2007  Job:  [id number]   cc:   Donia Guiles, M.D.  Fax: [phone]

## 2011-01-29 NOTE — Assessment & Plan Note (Signed)
[facility]                             PULMONARY OFFICE NOTE   GALI, SPINNEY                   MRN:          [id number]  DATE:08/04/2007                            DOB:          15-Sep-1941    PROBLEM LIST:  1. Allergic rhinitis.  2. Asthma with bronchitis.   HISTORY:  She had seen the nurse practitioner last January with an  exacerbation and had done well through the summer.  She travels  frequently as travel Nurse, adult.  Now for the past 2 weeks, has had  productive cough, yellow sputum and for 5 days has had wheezing.  Earlier this year, she had kidney surgery and then fractured her leg.  Those problems are resolving.  We discussed venous stasis prophylaxis  against clots because of the broken leg and her travel.   MEDICATIONS:  1. Hormones.  2. Desipramine 25 mg.  3. Tecturna.  4. Albuterol p.r.n.   ALLERGIES:  No known drug allergies.   OBJECTIVE:  VITAL SIGNS:  Weight 167 pounds, BP 130/70, pulse 97, room  air saturation 99%.  HEENT:  Throat is clear.  Nasal airway is a little edematous.  She has  bilateral mild cough which is nonproductive, but with a little  expiratory wheezing.  HEART:  Irregular without murmur.   IMPRESSION:  Asthmatic bronchitis exacerbation.   PLAN:  1. Nebulizer treatment with Xopenex 1.25 mg.  2. Depo-Medrol 80 mg IM.  3. Flu vaccine discussed and given.  4. Prednisone 8-day taper from 20 mg.  5. Phenergan with codeine cough syrup 5 mL q.6 h. p.r.n.  6. Supportive care, fluids and rest.  7. Schedule return in 12 months, but earlier p.r.n.    Clinton D. Maple Hudson, MD, Tonny Bollman, FACP  Electronically Signed   CDY/MedQ  DD: 08/05/2007  DT: 08/06/2007  Job #: [id number]   cc:   Donia Guiles, M.D.

## 2011-01-29 NOTE — Op Note (Signed)
Ashley [name], Ashley [name]            ACCOUNT NO.:  [id number]   MEDICAL RECORD NO.:  [id number]          PATIENT TYPE:  AMB   LOCATION:  NESC                         FACILITY:  [facility]   PHYSICIAN:  Boston Service, M.D.DATE OF BIRTH:  [birth year]   DATE OF PROCEDURE:  06/02/2007  DATE OF DISCHARGE:                               OPERATIVE REPORT   PREOPERATIVE DIAGNOSIS:  4 mm right ureterovesical junction stone, 5 mm  left ureterovesical junction stone.   POSTOPERATIVE DIAGNOSIS:  4 mm right ureterovesical junction stone, 5 mm  left ureterovesical junction stone.   PROCEDURE:  Cystoscopy, retrograde, right and left ureteroscopy, right  and left holmium laser fragmentation of stones, left double-J stent.   SURGEON:  Boston Service, M.D.   ASSISTANT:  None.   ANESTHESIA:  General.   FINDINGS:  Right and left ureteral stones.   SPECIMENS:  Fine stony fragments collected from straining the urine in  the PACU.   DISPOSITION OF SPECIMEN:  To the patient.   FINDINGS:  Bilateral distal ureteral calculi.   COMPLICATIONS:  None obvious.   DESCRIPTION OF PROCEDURE:  The patient was prepped and draped in the  dorsal lithotomy position after institution of an adequate level of  general anesthesia. CT scan on May 25, 2007, 4 mm right UVJ stone,  5 mm left UVJ stone, those findings were confirmed cystoscopically.  A  blocking catheter was selected, positioned at the right and left  ureteral orifice.  Gentle injection of contrast showed a 4 mm filling  defect in the right distal ureter and a 5 mm filling defect in the left  distal ureter.  A guidewire was negotiated easily into the upper pole  calyces.  The patient has bilateral nonobstructive upper pole calculi.  Once retrogrades had been obtained and guidewire placed on both sides,  the ureteroscope was inserted alongside the right guidewire, the stone  easily localized within the distal ureter, and a joule setting of 0.5  and  a 365 holmium fiber, the stone was then gently fragmented over a  period of about 15-20 minutes. All fragments appeared to be less than 1  mm.  The ureteroscope was removed and then inserted at the left ureteral  orifice.  A larger 5-6 mm calculus was identified which was then slowly  fragmented over a period of about 15-20 minutes. All fragments appeared  to be less than 1 mm. Due to the bilateral nature of the  treatment, a double-J stent was placed on the left.  The stent passed  easily over the indwelling guidewire with what appeared to be good  pigtail formation on guidewire removal.  The bladder was drained, the  cystoscope was removed.  The patient was given a B&O suppository and  returned to recovery in satisfactory condition.           ______________________________  Boston Service, M.D.     RH/MEDQ  D:  06/02/2007  T:  06/02/2007  Job:  [id number]   cc:   Donia Guiles, M.D.  Fax: [phone]

## 2011-02-01 NOTE — Op Note (Signed)
NAMEAMORE, ACKMAN NO.:  [id number]   MEDICAL RECORD NO.:  [id number]          PATIENT TYPE:  OBV   LOCATION:  0482                         FACILITY:  [facility]   PHYSICIAN:  Anselm Pancoast. Zachery Dakins, M.D.DATE OF BIRTH:  14-Jul-1941   DATE OF PROCEDURE:  01/12/2005  DATE OF DISCHARGE:                                 OPERATIVE REPORT   PREOPERATIVE DIAGNOSIS:  Subacute cholecystitis with stones.   OPERATION:  Laparoscopic cholecystectomy with cholangiogram.   ANESTHESIA:  General.   SURGEON:  Dr. Consuello Bossier   ASSISTANT:  Dr. Leonie Man   HISTORY:  Ashley [name] is a 70 year old female, who was referred to our  office from the [facility] group for symptomatic gallstones.  She stated  that over the past week, she has developed pain, upper abdomen, after  eating.  Will occasionally radiate to her back.  This is associated with  nausea.  She denied chills or fever but has had an excessive amount of  belching and denies any change with her stool.  She was seen this week by  Tillman Sers, one of the PAs with the [facility] group, who obtained an  ultrasound of the gallbladder that showed a large stone.  Laboratory studies  showed normal liver function studies but a slightly elevated amylase of  about 153.  The patient was seen yesterday in the office.  She has had kind  of mild tenderness with deep palpation in the right upper quadrant.  She was  not having fever.  She is getting ready for her daughter to be married in  the next few weekends and wanted to proceed on with an urgent  cholecystectomy because of the symptoms.  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  I discussed  with her that we would have to do a cholangiogram and that clinically  usually if the stones were out of the gallbladder, the liver function  studies  should be abnormal and that most likely this slightly elevated  amylase is probably not related to the gallbladder.  She also occasionally  has some alcohol with meals.   Preop, she was given 3 g of Unasyn, and she had PAS stockings and taken to  the operative suite.  Induction of general anesthesia, the abdomen prepped  with Betadine solution and draped in a sterile manner.  A small incision was  made below the umbilicus after an endotracheal tube, an oral tube into the  stomach.  The fascia was identified, and this was picked up between two  Kochers and a small opening carefully made into the peritoneal cavity.  A  pursestring suture of 0 Vicryl was placed, the Hasson cannula introduced,  and the gallbladder was tense, slightly swollen, but not acutely  erythematous.  The upper 10 mm trocar was placed in the subxiphoid area  after anesthetizing the fascia, and Dr. Lurene Shadow placed the two lateral 5 mm  trocars, and the gallbladder was  grasped and the liver retracted upward and  outward.  The cystic duct was dissected free and encompassed with a right-  angel.  The cystic artery was identified, separated from cystic duct, doubly  clipped proximally, singly distally, and then we placed a clip on the  junction of the cystic duct gallbladder.  A small opening made proximally in  the cystic duct, a Cook catheter introduced, and an x-ray was obtained which  showed good prompt filling of the extrahepatic biliary system and good flow  into the duodenum with no evidence of any common duct stones.  The catheter  was removed.  Three clips were placed on the proximal cystic duct.  The duct  was then divided.  The artery was divided distal to the two proximal clips,  and then the gallbladder was freed from its bed using predominantly the hook  electrocautery.  We did place the gallbladder within an EndoCatch bag.  There was an edematous wall of the gallbladder from kind of a subacute  vision, and the  camera was then switched to the upper trocar site, and the  bag containing the gallbladder was removed from the umbilical fascial  defect.  A second pursestring suture of 0 Vicryl was placed and the Marcaine  and adrenalin placed in the fascia at the umbilicus and both sutures tied.  Reinspection, and there was good hemostasis.  The little irrigating fluid  had been aspirated, and the subcutaneous wounds were closed with 4-0 Vicryl,  Benzoin and Steri-Strips on the skin.  There was a little bit of oozing  right at the subcuticular area, probably because of the aspirin that she  takes, and I did place a little more Xylocaine with adrenalin in the  subcutaneous 5 mm ports for a little hemostasis.  The Steri-Strips were  placed and the surgery completed and the patient sent to the recovery room  in a stable postoperative condition.  We will let her decide whether she  goes home this afternoon or goes home in the morning and, hopefully, she  will have minimal pain postoperatively since she has a busy schedule over  the next few weeks with her social plans.      WJW/MEDQ  D:  01/12/2005  T:  01/12/2005  Job:  [id number]   cc:   Georgann Housekeeper, MD  [address]  [place]  [place] [address]  Fax: [phone]

## 2011-02-01 NOTE — Assessment & Plan Note (Signed)
[facility]                             PULMONARY OFFICE NOTE   Ashley [name], Ashley [name]                   MRN:          [id number]  DATE:09/17/2006                            DOB:          June 06, 1941    HISTORY OF PRESENT ILLNESS:  The patient is a 70 year old white female  patient of Dr. Maple Hudson who has a know history of allergic rhinitis and  asthma, and presents for a 2-week history of progressively worsening  productive cough with thick yellow sputum and intermittent wheezing.  The patient complains over the last 2 months she has had on and off  cough, but has noticed progressively worsening symptoms over the last 2  weeks.  She denies any hemoptysis, chest pain, orthopnea, PND, or weight  loss.   PAST MEDICAL HISTORY:  Reviewed.   CURRENT MEDICATIONS:  Reviewed.   PHYSICAL EXAMINATION:  GENERAL:  The patient is a pleasant female in no  acute distress.  VITAL SIGNS:  She is afebrile with stable vital signs.  O2 saturation is  97% on room air.  HEENT:  Head is unremarkable.  NECK:  Supple without adenopathy.  LUNGS:  Coarse breath sounds bilaterally without wheezes or crackles.  HEART:  Regular rate and rhythm.  ABDOMEN:  Soft and benign.  EXTREMITIES:  Warm without any edema.   IMPRESSION:  Acute tracheobronchitis.  The patient was given Z-Pak.  Mucinex-DM.  May use Phenergan VC with Codeine, Nepro 8 ounces 1-2  teaspoons every 4-6 hours as needed for cough.  The patient is worse  today.  The patient was given Xopenex as treatment in the office.  The  patient is to return back with Dr. Maple Hudson in 1-2 months or sooner if  needed.      Rubye Oaks, NP  Electronically Signed      Clinton D. Maple Hudson, MD, Tonny Bollman, FACP  Electronically Signed   TP/MedQ  DD: 09/17/2006  DT: 09/17/2006  Job #: [id number]

## 2011-02-01 NOTE — Op Note (Signed)
[name], [name] NO.:  [id number]   MEDICAL RECORD NO.:  [id number]          PATIENT TYPE:  AMB   LOCATION:  NESC                         FACILITY:  [facility]   PHYSICIAN:  Boston Service, M.D.DATE OF BIRTH:  1941-04-18   DATE OF PROCEDURE:  05/13/2006  DATE OF DISCHARGE:                                 OPERATIVE REPORT   PROBLEM:  1. A 6.4-mm calculus, right distal ureter.  2. Intractable nausea with right costovertebral angle and right lower      quadrant tenderness.   POSTOPERATIVE DIAGNOSES:  1. A 6.4-mm calculus, right distal ureter.  2. Intractable nausea with right costovertebral angle and right lower      quadrant tenderness.   PROCEDURE:  Cystoscopy, ureteroscopy, stone extraction.   ANESTHESIA:  General.   DRAINS:  None.   COMPLICATIONS:  None obvious.   DESCRIPTION OF PROCEDURE:  The patient was prepped and draped in the dorsal  lithotomy position after institution of an adequate level of general  anesthesia.  Well-lubricated, 21-French panendoscope was gently inserted at  the urethral meatus.  Normal urethra and sphincter.  Trigone showed mild  descensus.  Clear efflux, left orifice; minimal efflux, right orifice.  Bladder showed no obvious intravesical pathology.   Blocking catheter was selected, positioned at the left ureteral orifice with  gentle injection of 3-6 mL of contrast.  Outlined of the left ureter was  identified without filling defect or obstruction.  Calyces were fine and  delicate with prompt drainage of 3-5 minutes.  Similar technique was used on  the right.  A 6-mm filling defect was identified distally with proximal  hydronephrosis.  Blocking catheter was withdrawn.  End-hole catheter was  selected.  Floppy tip guidewire was inserted and negotiated beyond the 6-mm  filling defect, followed the path of the ureter.  End-hole catheter was  withdrawn.  The short 6-French ureteroscope was then inserted alongside the  guidewire.  Stone was easily localized within the distal ureter, negotiated  into the Bard basket and then gently withdrawn.  Ureteroscope  was reinserted.  The area where the stone had been resting showed a patch of  erythema, but the ureter showed no other obvious intra-ureteral pathology.  Ureteroscope was then withdrawn.  Bladder was drained.  The patient was  given a B&O suppository and 30 mg of Toradol and was returned to recovery in  satisfactory condition.           ______________________________  Boston Service, M.D.     RH/MEDQ  D:  05/13/2006  T:  05/14/2006  Job:  [id number]   cc:   Donia Guiles, M.D.  Fax: [phone]

## 2011-02-01 NOTE — Op Note (Signed)
Ashley [name], Ashley [name]            ACCOUNT NO.:  [id number]   MEDICAL RECORD NO.:  [id number]          PATIENT TYPE:  AMB   LOCATION:  NESC                         FACILITY:  [facility]   PHYSICIAN:  Boston Service, M.D.DATE OF BIRTH:  03-14-1941   DATE OF PROCEDURE:  06/25/2005  DATE OF DISCHARGE:                                 OPERATIVE REPORT   PREOPERATIVE DIAGNOSIS:  A 5 x 6 mm calculus right distal ureter.   POSTOPERATIVE DIAGNOSIS:  A 5 x 6 mm calculus right distal ureter.   PROCEDURE:  Ureteroscopy, retrograde cystoscopy, basket extraction.   ANESTHESIA:  General.   DRAINS:  None.   COMPLICATIONS:  None.   DESCRIPTION OF PROCEDURE:  The patient was prepped and draped in the dorsal  lithotomy position after institution of adequate level of general  anesthesia. A well lubricated 21-French panendoscope was gently inserted at  the urethral meatus, normal urethra and sphincter. Clear efflux at the left  orifice, bullous edema at the right orifice with minimal efflux. The  remainder of the bladder appeared unremarkable, mild cystocele noted.   Left retrograde was performed using a cone-tip catheter positioned at the  left ureteral orifice. Gentle injection of 3-6 mL of contrast outlined the  proximal two-thirds of the ureter. Continued injection of contrast outlined  the pelvis and calices. A similar technique was used on the right side. The  patient appeared to have about 5 x 6 mm calcification densely impacted in  the distal ureter with proximal hydroureter. A guidewire was then negotiated  beyond the stone, end-hole catheter passed over the guidewire with immediate  efflux of concentrated urine around the ureteral stent. The guidewire was  advanced into the upper pole calices, end-hole catheter was withdrawn. The  short 6-French ureteroscope was then gently inserted at the right ureteral  orifice, negotiated to the level of the stone. The stone was easily  manipulated due  to the proximal hydroureter. It was negotiated into a Bard  basket and then gently withdrawn intact. The ureteroscope was then  reinserted, no other stony fragments were identified within the ureter and  no evidence of ureteral abnormalities. The ureteroscope was withdrawn,  bladder was drained. The patient was given a B&O suppository and 30 mg of  Toradol and returned to recovery in satisfactory condition.           ______________________________  Boston Service, M.D.     RH/MEDQ  D:  06/25/2005  T:  06/25/2005  Job:  [id number]   cc:   Georgann Housekeeper, MD  Fax: [phone]

## 2011-02-01 NOTE — H&P (Signed)
   NAME:  Ashley [name], Ashley [name]                      ACCOUNT NO.:  [id number]   MEDICAL RECORD NO.:  [id number]                   PATIENT TYPE:  OBV   LOCATION:  0460                                 FACILITY:  [facility]   PHYSICIAN:  Boston Service, M.D.             DATE OF BIRTH:  1941/07/08   DATE OF ADMISSION:  02/09/2003  DATE OF DISCHARGE:                                HISTORY & PHYSICAL   PRIMARY CARE PHYSICIAN:  1. Talmadge Coventry, M.D.  2. Kerrin Champagne, M.D.   UROLOGIST:  Boston Service, M.D.   HISTORY OF PRESENT ILLNESS:  The patient is a 70 year old female with  intractable pain, a 4 x 5 mm right mid ureteral calculus, a 6 x 6 mm left  UVJ stone.  These are notable points for the recent history.  In 1991, left  ureteral injury at the time of hysterectomy.  In February 1996, ureteroscopy  stone manipulation.  In December 1998, right ESWL.  In May 1999, left ESWL.  In June 1999, right ESWL.  In April 2002, ureteroscopy stone manipulation.  In September 2002, Homian laser treatment of ureteral calculus.  In May  2004, CMP office 4 mm right ureteral calculus, 6 mm left UVJ stone.  Risks  and benefits of ureteroscopy, vascular extraction, and Homian laser  fragmentation were outlined with the patient due to bilateral ureteral  calculi felt necessary to proceed with therapy.   MEDICATIONS:  1. Estrogen.  2. Progesterone.  3. Doses of IM Toradol and IV Zofran given in the office today.   ALLERGIES:  No known drug allergies.   HABITS:  Denies tobacco or ETOH.   PAST MEDICAL HISTORY:  1. Pyelolithotomy in the distant past.  2. Ureterolithotomy in the distant past.   PHYSICAL EXAMINATION:  GENERAL:  An exhausted 70 year old female with  intractable right and left CVA tenderness.  VITAL SIGNS:  Temperature 98, pulse 88, blood pressure 148/88.  HEENT/NECK:  Negative adenopathy, negative bruit.  LUNGS:  Clear to auscultation and percussion.  HEART:  Regular rate and  rhythm without murmur or gallop.  ABDOMEN:  Positive bowel sounds, soft.  GENITOURINARY:  Right and left CVA tenderness.  Uterus is surgically absent.  NEUROLOGIC:  Grossly intact.   PLAN:  We will proceed this p.m. with ureteroscopy and either basket  extraction or Homian fragmentation.                                                Boston Service, M.D.    RH/MEDQ  D:  02/09/2003  T:  02/09/2003  Job:  [id number]   cc:   Talmadge Coventry, M.D.  [address]  [place]  [place] [address]  Fax: [phone]

## 2011-02-01 NOTE — Consult Note (Signed)
   NAME:  Ashley [name], Ashley [name] NO.:  [id number]   MEDICAL RECORD NO.:  [id number]                   PATIENT TYPE:  EMS   LOCATION:  MAJO                                 FACILITY:  [facility]   PHYSICIAN:  Coletta Memos, M.D.                  DATE OF BIRTH:  [birth year]   DATE OF CONSULTATION:  10/04/2002  DATE OF DISCHARGE:  10/03/2002                                   CONSULTATION   HISTORY OF PRESENT ILLNESS:  [name] is a patient of my partner,  Dr. Barnett Abu, who performed a lumbar procedure approximately two days  ago.  She was discharged from the hospital today.  She called me at 11:00  p.m. stating that she had significant abdominal pain, she was nauseated and  that she also had not had a bowel movement since Wednesday.  She states that  her regularity is such that she goes once per day.  She also asked me  whether or not she had a bowel obstruction.  As I explained to her, it is  impossible to prove a negative, especially by the phone and that if she  needed to have an answer to the question, did I think she had a bowel  obstruction, she would need to come into the hospital for an abdominal x-ray  series.  I explained to [name] that it was well within the norm to  be constipated after a lumbar procedure and after receiving a significant  number of narcotics.  She also stated that her temperature was 101 and had  been rising.  She is insistent that her normal temperature is always less  than 98.6, so for it to be 101 was concerning.  I instructed Ms.  Mazzoni, therefore, to come into the emergency room for evaluation.   [name] came into the emergency room and had an abdominal series.  She had gas well into the colon.  She had a significant amount of stool, but  no evidence of bowel obstruction.  Historically, she has not had any foul-  smelling vomitus.  She has had no prior history of a bowel obstruction.  She  is taking  significant number of narcotics secondary to her pain.  She,  overall, looks fine.  I instructed her to use magnesium citrate and that she  might receive an enema.  She refused an enema and left the emergency room.   DIAGNOSIS:  Constipation.                                               Coletta Memos, M.D.    KC/MEDQ  D:  10/03/2002  T:  10/03/2002  Job:  [id number]

## 2011-02-01 NOTE — Op Note (Signed)
[place]. [facility]  Patient:    Ashley [name], Ashley [name]                  MRN: [id number] Proc. Date: 12/24/00 Attending:  Janet Berlin. Dan Humphreys, M.D.                           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  OPERATION PERFORMED:  Excisional biopsy of right preauricular basal cell carcinoma, 1.3 cm.  SURGEON:  Janet Berlin. Dan Humphreys, M.D.  ASSISTANT:  None.  ANESTHESIA:  Local.  INDICATIONS FOR PROCEDURE:  The patient is a 70 year old woman who has a biopsy proven basal cell carcinoma involving the right preauricular area inferiorly, just above the jawline.  She is taken to the operating room at this time for excisional biopsy.  DESCRIPTION OF PROCEDURE:  The patient was brought back to the minor surgery operating room at the [facility].  She was placed on the table in the supine position with her head turned to the left.  The right cheek was sterilely prepped and draped.  I designed an elliptically shaped excision to provide for grossly clear tissue margins.  The soft tissues were then infiltrated with 1% Xylocaine containing epinephrine.  The lesion was then excised full thickness and oriented for the pathologist by placing a suture at its superior margin.  The specimen was passed off the operative field for permanent sections.  The resulting defect was closed in layers.  I used interrupted, buried 4-0 Vicryl sutures for wound edge approximation. Both vertical mattress and simple running sutures of 6-0 Monofilament nylon were used for final epidermal leveling.  Bacitracin ointment was applied to the suture wound which was then covered with a sterile band-aid.  The patient was given wound care instructions and will see me early next week for a wound check, suture removal, and review of her  final pathology report. DD:  12/24/00 TD:  12/24/00 Job: [id number]

## 2011-02-01 NOTE — Assessment & Plan Note (Signed)
[facility]                             PULMONARY OFFICE NOTE   JISELL, MAJER                   MRN:          [id number]  DATE:09/19/2006                            DOB:          [birth year]    PROBLEMS:  1. Allergic rhinitis.  2. Asthma.   HISTORY:  She has had a significant head and chest cold.  Never got flu  vaccine.  Has not had high fever or muscle aches.  Saw a nurse  practitioner last week, and was put on a Z-pack.  Cough is now getting  wheezy.  She expresses concern because her mother died of COPD, and we  agreed that once she was over this acute illness we would check PFTs.  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   MEDICATIONS:  1. Hormones.  2. Desipramine 25 mg.  3. Advair 250/50.  4. Albuterol rescue inhaler, which she is out of.   ALLERGIES:  No medication allergy.   OBJECTIVE:  Weight 157 pounds, BP 124/70, pulse regular 101, room air  saturation 97%.  There is moderate nasal congestion with a lot of clear, thick mucus.  Dry cough with end expiratory wheeze.  No neck vein distention.  Heart sounds are regular without murmur.   IMPRESSION:  Acute rhinitis and bronchitis syndrome, probably viral.   PLAN:  1. Albuterol aerosol.  2. Depo 80.  3. Prednisone 8-day taper from 20 mg to hold with steroid talk.  4. Refilled prescription Advair at 100/50 1 b.i.d.  5. Refilled albuterol inhaler 2 puffs q.i.d. p.r.n.  6. Schedule return 6 weeks for PFT.     Clinton D. Maple Hudson, MD, Tonny Bollman, FACP  Electronically Signed   CDY/MedQ  DD: 09/19/2006  DT: 09/19/2006  Job #: [id number]   cc:   Donia Guiles, M.D.

## 2011-02-01 NOTE — Op Note (Signed)
[place]. [facility]  Patient:    Ashley [name], Ashley [name]                   MRN: [id number] Adm. Date:  [id number] Disc. Date: [id number] Attending:  Melody Comas.                           Operative Report  PREOPERATIVE DIAGNOSIS:  A 4 mm right ureterovesical junction stone.  POSTOPERATIVE DIAGNOSIS:  A 4 mm right ureterovesical junction stone.  PROCEDURES:  Cystoscopy, ureteroscopy, retrograde, stone manipulation.  ANESTHESIA:  General.  DRAINS:  None.  COMPLICATIONS:  None.  DESCRIPTION OF PROCEDURE:  The patient was prepped and draped in the dorsal lithotomy position after institution of an adequate level of general anesthesia.  A well-lubricated 21 French panendoscope was gently inserted at the urethral meatus.  Normal urethra and sphincter.  Normal trigone and orifices.  Clear efflux left orifice.  Minimal efflux right orifice.  Left retrograde showed normal course and caliber of the ureter, pelvis, and calyces, with prompt drainage at three to five minutes.  Right retrograde showed 4 mm filling defect in the distal ureter consistent with stone noted on office x-rays.  Short 6 French ureteroscope was then gently inserted up the right ureteral orifice, advanced about 2 cm.  Stone was easily identified, negotiated into a 3 [place] Environmental education officer and then withdrawn.  The ureteroscope was then reinserted.  No additional stony fragments were identified in the distal ureter.  There was clear efflux at the right ureteral orifice.  No double J stent was left indwelling.  Bladder was drained, and the scope was removed.  The patient was returned to recovery in satisfactory condition. DD:  12/30/00 TD:  12/31/00 Job: [id number]

## 2011-02-01 NOTE — Op Note (Signed)
NAMEEMMABELLE, FEAR NO.:  [id number]   MEDICAL RECORD NO.:  [id number]          PATIENT TYPE:  AMB   LOCATION:  DAY                          FACILITY:  [facility]   PHYSICIAN:  Boston Service, M.D.DATE OF BIRTH:  14-Jan-1941   DATE OF PROCEDURE:  09/12/2004  DATE OF DISCHARGE:                                 OPERATIVE REPORT   PREOPERATIVE DIAGNOSES:  Urolithiasis with two stones in the right distal  ureter.   POSTOPERATIVE DIAGNOSIS:  Urolithiasis with two stones in the right distal  ureter.   PROCEDURE:  1.  Cystoscopy.  2.  Retrograde ureteroscopy.  3.  Stone manipulation.   ANESTHESIA:  General.   DRAINS:  None.   COMPLICATIONS:  None.   SPECIMEN:  Stone.   DESCRIPTION OF PROCEDURE:  The patient was prepped and draped in the dorsal  lithotomy position after institution of an adequate level of general  anesthesia.  Well-lubricated 22 French panendoscope was gently inserted at  the urethral meatus.  Normal urethra and sphincter.  Clear efflux left  orifice, bullous edema and no efflux at the right orifice.  Left retrograde,  normal course and caliber of the ureter, pelvis, and calices, nonobstructive  calculi, lower pole calix.  Right retrograde showed filling defects in the  distal ureter with proximal hydronephrosis.  Guidewire was negotiated beyond  the distal ureteral filling defects, coiled nicely in the upper pole  calices.  Cystoscope was withdrawn.  Ureteroscope was inserted alongside the  guidewire.  Stones were negotiated into the basket and then removed with  difficulty.  The largest stone came out first and smaller stone came out  afterwards.  Ureteroscope was reinserted to the limit of the short 6 [place] scope.  No  additional stony fragments were identified in the distal ureter.  Bladder  was drained.  Patient was given a B&O suppository and 30 mg of Toradol and  returned to recovery in satisfactory condition.      RH/MEDQ  D:   09/12/2004  T:  09/12/2004  Job:  [id number]   cc:   Durwin Reges., M.D.  [phone] [address] [place]  [place] [address]  Fax: [phone]

## 2011-02-01 NOTE — Op Note (Signed)
NAME:  Ashley [name], Ashley [name]                      ACCOUNT NO.:  [id number]   MEDICAL RECORD NO.:  [id number]                   PATIENT TYPE:  AMB   LOCATION:  NESC                                 FACILITY[facility]  [facility]   PHYSICIAN:  Boston Service, M.D.             DATE OF BIRTH:  1941/01/04   DATE OF PROCEDURE:  05/29/2004  DATE OF DISCHARGE:                                 OPERATIVE REPORT   PREOPERATIVE DIAGNOSIS:  A 70 year old female, long history of urolithiasis,  status post extracorporeal shockwave lithotripsy and ureteroscopy in the  past, has had by my best estimation four to five procedures for stones over  the last several years.  The patient did have injury to the ureter at the  time of a hysterectomy many years ago.  This was repaired primarily.  Intravenous pyelogram and retrograde since then show no evidence of scarring  or deformity within the ureter.  Recently the patient presented to the  office with intense left costovertebral angle tenderness, radiation to the  left lower quadrant, plain films consistent with a 4 mm calculus in the left  distal ureter.  The patient has nonobstructive stones in both the right and  left kidneys.  She is scheduled for a trip to [place] in several days, is  anxious to have the stone removed so that she is not sick while I'm away  from home.   ANESTHESIA:  General.   DRAINS:  None.   COMPLICATIONS:  None.   DESCRIPTION OF PROCEDURE:  The patient was prepped and draped in the dorsal  lithotomy position after an institution of an adequate level of general  anesthesia.  A well-lubricated 21 French panendoscope was gently inserted at  the urethral meatus, normal urethra and sphincter.  Clear efflux, right  orifice.  Minimal efflux, left orifice.  Right retrograde:  Normal course  and caliber of the ureter, pelvis, and calyces.  Left retrograde:  A 4-5 mm  filling defect, distal ureter.  Guidewire was negotiated at the left  ureteral orifice,  coiled nicely in the upper pole calyces.  A 6 French  ureteroscope was inserted alongside the guidewire.  The stone was easily  localized within the distal ureter, negotiated into the basket, and then  withdrawn.  The ureteroscope was then reinserted to the limit of the short 6  [place] scope.  No additional stony fragments identified within the left  ureter.  The ureteroscope was withdrawn.  The bladder was drained.  The  patient was given a B&O suppository and returned to recovery in satisfactory  condition.                                               Boston Service, M.D.    RH/MEDQ  D:  05/29/2004  T:  05/29/2004  Job:  [id number] 

## 2011-02-01 NOTE — Op Note (Signed)
NAME:  ARIYAN, SINNETT                      ACCOUNT NO.:  [id number]   MEDICAL RECORD NO.:  [id number]                   PATIENT TYPE:  INP   LOCATION:  3172                                 FACILITY:  [facility]   PHYSICIAN:  Stefani Dama, M.D.               DATE OF BIRTH:  07-07-1941   DATE OF PROCEDURE:  09/30/2002  DATE OF DISCHARGE:                                 OPERATIVE REPORT   PREOPERATIVE DIAGNOSES:  1. Spondylolisthesis with stenosis, fourth/fifth lumbar.  2. Lumbar radiculopathy.   POSTOPERATIVE DIAGNOSES:  1. Spondylolisthesis with stenosis, fourth/fifth lumbar.  2. Lumbar radiculopathy.   PROCEDURE:  Bilateral laminotomies and foraminotomies with operating  microscope, microdissection technique, and Metrex endoscopic system.   SURGEON:  Stefani Dama, M.D.   FIRST ASSISTANT:  Clydene Fake, M.D.   ANESTHESIA:  General endotracheal.   INDICATIONS:  The patient is a 70 year old individual who has had  significant back and right lower extremity pain.  She has evidence of a  degenerative spondylolisthesis at the L4-L5 level causing severe stenosis,  worse on the right than on the left.  The patient was advised regarding  surgical decompression of this process via bilateral laminotomies and  foraminotomies.  She is now taken to the operating room.   PROCEDURE IN DETAIL:  The patient was brought to the operating room supine  on the stretcher.  After smooth induction of general endotracheal  anesthesia, she was turned prone.  The back was shaved, prepped with  Duraprep and draped in a sterile fashion.  A midline incision was made over  the L4-L5 area which was localized with a radiograph.  Dissection was  carried down to the lumbodorsal fascia which was first opened on the right  side.  The interlaminar space at L4-L5 was then explored, and a self-  retaining retractor was placed on that side.  After clearing the soft  tissues, laminotomy was created removing  the inferior margin of lamina of L4  out to the mesial wall of the facet.  There was noted to be a remarkable  amount of facet hypertrophy and overgrowth of the inferior margin of the  facet of L4.  This was removed with a high-speed air drill and 2.8-mm  dissecting tool.  Then as the yellow ligament was identified, it was noted  to be thickened and markedly redundant.  This was taken off, and ultimately  a common dural tube was explored.  The takeoff of the L5 nerve root could be  seen being splayed and flattened as it crossed the spondylolisthetic  segment.  This area was decompressed laterally and on the dorsal aspect of  the nerve so as to allow free egress of the nerve into the foramen of L5.  Superiorly then, there was noted to be a significant amount of ligamentous  overgrowth causing mass effect on the common dural tube and the superior-  most aspect of  the takeoff of the L4 nerve root.  This was cleared.  In the  end, the L4 nerve root could be palpated and seen exiting the foramen, and  the foramen could be sounded with ease.  Hemostasis in the epidural soft  tissues was obtained.  This procedure was done in concert with Dr. Colon Branch who provided retraction and exposure while the dissection ensued.  The operating microscope was used for this portion of the decompression.  On  the contralateral side, that is the left side then, a Metrex procedure was  performed through the same open incision so as to allow a minimum of  dissection.  There were no radicular signs and symptoms on this side;  however because of the patient's stenotic features on MRI, a decompression  through a minimally invasive technique was performed here using a 16-mm x 5-  cm-deep endoscopic cannula.  The operating microscope was brought over this  opening, and dissection was carried down to the laminar arch and the mesial  wall of the facet.  This was taken down, and the redundant yellow ligament  in this  area was cleared.  Similarly, the high-speed air drill was used to  remove the inferior margin of the markedly overgrown facet of L4.  The  common dural tube was ultimately explored, and the takeoff of the L5 nerve  root was again noted to be splayed over a marginal ridge.  The dorsal  decompression was then undertaken.  In the end, the common dural tube and  superior aspect of the takeoff of the L4 nerve root were decompressed.  Once  hemostasis was achieved, a total of 40 mcg of Depo-Medrol was infused into  the epidural space.  The lumbodorsal fascia was then closed with #1 Vicryl  in interrupted fashion, 2-0 Vicryl was used subcutaneously, and 3-0 Vicryl  subcuticular.  The patient tolerated the procedure well and was returned to  the recovery room in stable condition.                                                Stefani Dama, M.D.    Merla Riches  D:  09/30/2002  T:  09/30/2002  Job:  [id number]

## 2011-02-01 NOTE — Op Note (Signed)
NAME:  Ashley [name], Ashley [name]                      ACCOUNT NO.:  [id number]   MEDICAL RECORD NO.:  [id number]                   PATIENT TYPE:  AMB   LOCATION:  DAY                                  FACILITY:  [facility]   PHYSICIAN:  [place] Service, M.D.             DATE OF BIRTH:  01-Nov-1940   DATE OF PROCEDURE:  02/09/2003  DATE OF DISCHARGE:                                 OPERATIVE REPORT   PREOPERATIVE DIAGNOSIS:  A 4 x 5 mm calculus right mid ureter, 6 x 6 mm  calculus left distal ureter.   POSTOPERATIVE DIAGNOSIS:  A 4 x 5 mm calculus right mid ureter, 6 x 6 mm  calculus left distal ureter.   OPERATION/PROCEDURE:  1. Cystoscopy.  2. Retrograde.  3. Right and left ureteroscopy with stone manipulation.  4. Placement of 6-French 24 cm right double-J stent.   ANESTHESIA:  General.   SPECIMENS:  Right and left ureteral calculi retrieved intact.   DRAINS:  A 6-French 24 cm double-J stent.   DESCRIPTION OF PROCEDURE:  The patient was prepped and draped in the dorsal  lithotomy position after institution of adequate level of general  anesthesia.  A well-lubricated 22-French panendoscope was gently inserted at  the urethral meatus.  Normal urethra and sphincter.  Bullous edema at the  left orifice.  The remainder of the bladder showed no obvious abnormality.  Left retrograde showed 6 mm filling defect in the left distal ureter with  proximal hydronephrosis.  Guide wire was advanced to the upper pole calyces.  A 6-French end hole catheter was passed over the guide wire, left in place  for five minutes and then withdrawn.  Ureteroscope was inserted.  Stone was  easily identified within the distal ureter, negotiated into the 3-French  basket and then gently withdrawn.  Retrograde on the right side showed stone  just above the vessels.  Guide wire was negotiated beyond the stone, coiled  nicely in the upper pole calyces. End hole catheter was advanced over the  guide wire, left in  place for five minutes and then withdrawn.  Ureteroscope  was then gently inserted alongside the wire, taking care to avoid injury to  the ureter, passed over the vessels without difficulty.  Stone was easily  identified.  Gentle pressure with the scope allowed the stone to fall back  into a dilated portion of the proximal ureter.  Basket was inserted through  the scope.  The stone was negotiated into the basket and then scope was  gently withdrawn, taking care to avoid injury to the ureteral wall.  Stone  was recovered intact.  Basket was given back to the nurse and ureteroscope  was reinserted.  No evidence of perforation or disruption of the ureter was  identified.  Ureteroscope was then withdrawn.  Cystoscope was reinserted  and a 6-French 26 cm double-J stent was inserted over the indwelling guide  wire with excellent pigtail formation  on guide wire removal.  String was  left on the double-J stent. Bladder was drained.  The patient was given a  B&O suppository and 30 mg of Toradol and returned to recovery in  satisfactory condition.                                               Boston Service, M.D.    RH/MEDQ  D:  02/09/2003  T:  02/09/2003  Job:  [id number]   cc:   Talmadge Coventry, M.D.  [address]  [place]  [place] [address]  Fax: [phone]

## 2011-02-01 NOTE — Op Note (Signed)
[facility]  Patient:    Ashley [name], Ashley [name] Visit Number: [id number] MRN: [id number]          Service Type: Attending:  Verl Dicker, M.D. Proc. Date: 06/09/01                             Operative Report  DATE OF BIRTH:  1941/07/28  REFERRING PHYSICIAN:  PREOPERATIVE DIAGNOSIS:  A 7 mm right ureterovesical junction stone.  POSTOPERATIVE DIAGNOSIS:  A 7 mm right ureterovesical junction stone.  PROCEDURE:  Cystoscopy, holmium laser fragmentation of stone with ureteroscopy, cystoscopy, and retrogrades.  ANESTHESIA:  General.  DRAINS:  None.  COMPLICATIONS:  None.  DESCRIPTION OF PROCEDURE:  The patient was prepped and draped in the dorsal lithotomy position after institution of an adequate level of general anesthesia.  The patient is currently undergoing steroid injections at the L5 to L4 level with Dr. Ethelene Hal, and great care was taken in properly positioning the patient on the table.  A 22 French panendoscope was gently inserted at the urethral meatus.  Left retrograde showed normal course and caliber of the ureter, pelvis, and calices.  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  Right retrograde showed 7 mm filling defect in the right distal ureter with mild proximal hydronephrosis. Guidewire was then negotiated around the stone, coiled nicely in the upper pole calices.  A 6 French end-hole catheter was passed over the guidewire and left in place for five minutes and then withdrawn.  A 6 French ureteroscope was then inserted with 365 holmium laser fiber.  A single khaki-brown 7 mm calculus was encountered in the distal ureter.  With guidewire in place, treatment was initiated at a setting of 0.6/6 and stone was carefully fragmented over a period of 10-20 minutes.  Multiple small fragments, 1 cm or less were created in  the right distal ureter.  Laser fiber was withdrawn and 3 [place] flat-wire basket was inserted through the ureteroscope and then gently drawn across the distal ureter, raking fragments into the bladder.  The ureteroscope was reinserted.  No additional stony fragments identified within the ureter.  No injury to the ureter identified.  Stones were recovered from the bladder.  The bladder was drained, cystoscope was removed, the patient given a B&O suppository, and returned to recovery in satisfactory condition. Attending:  Verl Dicker, M.D. DD:  06/09/01 TD:  06/09/01 Job: [id number]

## 2011-02-21 ENCOUNTER — Ambulatory Visit (HOSPITAL_BASED_OUTPATIENT_CLINIC_OR_DEPARTMENT_OTHER)
Admission: RE | Admit: 2011-02-21 | Discharge: 2011-02-21 | Disposition: A | Discharge status: Home / Self Care | Payer: Medicare Other | Source: Ambulatory Visit | Attending: Urology | Admitting: Urology

## 2011-02-21 ENCOUNTER — Ambulatory Visit (HOSPITAL_COMMUNITY)
Admission: RE | Admit: 2011-02-21 | Discharge: 2011-02-21 | Disposition: A | Discharge status: Home / Self Care | Payer: Medicare Other | Source: Ambulatory Visit | Attending: Urology | Admitting: Urology

## 2011-02-21 DIAGNOSIS — N200 Calculus of kidney: Secondary | ICD-10-CM | POA: Insufficient documentation

## 2011-02-21 DIAGNOSIS — Z01810 Encounter for preprocedural cardiovascular examination: Secondary | ICD-10-CM | POA: Insufficient documentation

## 2011-02-21 DIAGNOSIS — N201 Calculus of ureter: Secondary | ICD-10-CM | POA: Insufficient documentation

## 2011-02-21 DIAGNOSIS — Z01818 Encounter for other preprocedural examination: Secondary | ICD-10-CM | POA: Insufficient documentation

## 2011-02-21 DIAGNOSIS — I10 Essential (primary) hypertension: Secondary | ICD-10-CM | POA: Insufficient documentation

## 2011-02-21 LAB — POCT I-STAT, CHEM 8
Calcium, Ion: 1.19 mmol/L (ref 1.12–1.32)
Chloride: 102 mEq/L (ref 96–112)
HCT: 44 % (ref 36.0–46.0)
Potassium: 3.9 mEq/L (ref 3.5–5.1)
Sodium: 141 mEq/L (ref 135–145)

## 2011-02-27 NOTE — Op Note (Addendum)
  Ashley [name], DEARING NO.:  [id number]  MEDICAL RECORD NO.:  [id number]  LOCATION:  XRAY                         FACILITY:  [facility]  PHYSICIAN:  Maretta Bees. Vonita Moss, M.D.DATE OF BIRTH:  Feb 07, 1941  DATE OF PROCEDURE:  02/21/2011 DATE OF DISCHARGE:  02/21/2011                              OPERATIVE REPORT   PREOPERATIVE DIAGNOSIS:  Distal right ureteral stone.  POSTOPERATIVE DIAGNOSIS:  Distal right ureteral stone.  PROCEDURE:  Cystoscopy, right ureteroscopy, and stone basketing.  SURGEON:  Maretta Bees. Vonita Moss, M.D.  ANESTHESIA:  Spinal.  INDICATIONS:  This lady has had a long history of stone disease and has been having recurrent symptoms from a small distal ureteral stone. Unfortunately, she has a difficult time passing stone spontaneously. Because of persistent symptoms, she was brought to the OR today.  PROCEDURE:  The patient brought to the operating room and placed in lithotomy position after the induction of satisfactory spinal anesthesia.  She was prepped and draped in the usual fashion.  She was cystoscoped and the bladder was unremarkable except for a cystocele. The right ureteral orifice was identified and a Sensor guidewire was placed up without difficulty.  I could not insert the rigid scope until I dilated the intramural ureter with the inner core of a ureteral access sheath.  I was then able to negotiate the intramural ureter and identify a small yellow calculus, which I retrieved with a Nitinol basket without problems.  The stone location had some edema, but there was no sign of any tear or perforation.  Therefore, the ureteroscope was removed.  The guidewire was removed.  The bladder was emptied and Xylocaine jelly injected per urethra.  She was also given B and O suppository, 4 mg of Zofran, and 15 mg of Toradol IV for postop discomfort, which seemed to have helped her in the past.  She tolerated the procedure well.  There was essentially no  blood loss.     Maretta Bees. Vonita Moss, M.D.     LJP/MEDQ  D:  02/21/2011  T:  02/22/2011  Job:  [id number]  Electronically Signed by Larey Dresser M.D. on 02/27/2011 08:49:29 AM

## 2011-03-26 ENCOUNTER — Ambulatory Visit (INDEPENDENT_AMBULATORY_CARE_PROVIDER_SITE_OTHER): Payer: Medicare Other | Admitting: Surgery

## 2011-04-17 ENCOUNTER — Encounter (INDEPENDENT_AMBULATORY_CARE_PROVIDER_SITE_OTHER): Payer: Self-pay | Admitting: General Surgery

## 2011-04-17 ENCOUNTER — Ambulatory Visit: Payer: Medicare Other | Admitting: Internal Medicine

## 2011-04-22 ENCOUNTER — Ambulatory Visit (INDEPENDENT_AMBULATORY_CARE_PROVIDER_SITE_OTHER): Payer: Medicare Other | Admitting: Surgery

## 2011-04-22 ENCOUNTER — Encounter (INDEPENDENT_AMBULATORY_CARE_PROVIDER_SITE_OTHER): Payer: Self-pay | Admitting: Surgery

## 2011-04-22 VITALS — BP 152/88 | HR 84 | Temp 97.1°F | Ht 62.0 in | Wt 151.0 lb

## 2011-04-22 DIAGNOSIS — E210 Primary hyperparathyroidism: Secondary | ICD-10-CM | POA: Insufficient documentation

## 2011-04-22 NOTE — Progress Notes (Signed)
Chief Complaint  Patient presents with  . Parathyroid Adenoma    Evaluate for possible hyperparathyroidism    HISTORY: Patient is a 70 year old white female who referred by her urologist for evaluation of possible primary hyperparathyroidism. Patient has a history of nephrolithiasis dating back 35 years. She has passed at least 30 kidney stones. She also has had bone density scanning showing evidence of osteopenia. There is no family history of endocrine disease. Patient has had no prior head and neck surgery except for cosmetic facial surgery. Patient was evaluated by Dr. Vonita Moss. This included laboratory studies show an elevated intact PTH level of 96 and of 106. Patient has a normal calcium of 9.7 and kidney stone testing showed calcium oxalate stones in the past.  Patient is now referred for further evaluation of possible primary hyperparathyroidism.   Past Medical History  Diagnosis Date  . Chronic rhinosinusitis     allergy skin testing positive 10-05-03- did not try allergy vaccine  . Kidney stones   . Asthmatic bronchitis   . FH: cataracts   . Glaucoma   . Hypertension   . Nausea & vomiting   . Arthritis   . Back pain   . Family history of breast cancer     mother     Current outpatient prescriptions:aliskiren (TEKTURNA) 150 MG tablet, Take 150 mg by mouth daily.  , Disp: , Rfl: ;  celecoxib (CELEBREX) 100 MG capsule, Take 100 mg by mouth daily. As needed, Disp: , Rfl: ;  Cholecalciferol (VITAMIN D PO), Take by mouth daily.  , Disp: , Rfl: ;  desipramine (NORPRAMIN) 25 MG tablet, Take 25 mg by mouth daily.  , Disp: , Rfl:  estradiol (VIVELLE-DOT) 0.1 MG/24HR, Place 1 patch onto the skin 2 (two) times a week.  , Disp: , Rfl: ;  fish oil-omega-3 fatty acids 1000 MG capsule, Take 2 g by mouth daily.  , Disp: , Rfl: ;  HYDROcodone-acetaminophen (NORCO) 5-325 MG per tablet, Take 1 tablet by mouth as needed.  , Disp: , Rfl: ;  metroNIDAZOLE (FLAGYL) 500 MG tablet, Take 500 mg by mouth  3 (three) times daily.  , Disp: , Rfl:  Multiple Vitamin (MULTIVITAMIN PO), Take by mouth daily.  , Disp: , Rfl: ;  oxyCODONE-acetaminophen (TYLOX) 5-500 MG per capsule, Take 1 capsule by mouth as needed.  , Disp: , Rfl: ;  predniSONE (DELTASONE) 10 MG tablet, Take 10 mg by mouth as needed.  , Disp: , Rfl: ;  promethazine (PHENERGAN) 25 MG tablet, Take 25 mg by mouth as needed.  , Disp: , Rfl:  albuterol (PROVENTIL HFA) 108 (90 BASE) MCG/ACT inhaler, Inhale 2 puffs into the lungs 4 (four) times daily as needed.  , Disp: , Rfl: ;  benzonatate (TESSALON PERLES) 100 MG capsule, Take 1 capsule (100 mg total) by mouth every 6 (six) hours as needed for cough., Disp: 30 capsule, Rfl: 1;  mometasone (NASONEX) 50 MCG/ACT nasal spray, 2 sprays each nostril once daily at bedtime, Disp: 17 g, Rfl: 2 Naproxen Sodium (ALEVE) 220 MG CAPS, Take as directed per bottle as needed , Disp: , Rfl: ;  terbinafine (LAMISIL) 250 MG tablet, Take 250 mg by mouth daily.  , Disp: , Rfl:    No Known Allergies   Family History  Problem Relation Age of Onset  . COPD Mother      History  Substance Use Topics  . Smoking status: Former Smoker    Types: Cigarettes    Quit date:  09/16/1985  . Smokeless tobacco: Not on file  . Alcohol Use: Yes     social - weekend     PERTINENT POSITIVES FROM REVIEW OF SYSTEMS: Nephrolithiasis, osteopenia, abdominal pain, fatigue, headaches.   EXAM: Filed Vitals:   04/22/11 1349  BP: 152/88  Pulse: 84  Temp: 97.1 F (36.2 C)   HEENT shows her to be normocephalic. Pupils are equal and reactive. Sclerae are clear. Dentition good. Voice is normal. Neck is symmetric on extension. Palpation of the thyroid shows slight nodularity without dominant or discrete mass. No anterior or posterior cervical lymphadenopathy no supraclavicular masses. Chest is clear to auscultation bilaterally without rales rhonchi or wheeze. Cardiac exam shows regular rate and rhythm without murmur. Neurologic  exam shows the patient to have no focal neurologic deficits. There is no sign of tremor.. Extremities are nontender without edema.   LABORATORY RESULTS: See E-Chart for most recent results   RADIOLOGY RESULTS: See E-Chart or I-Site for most recent results   IMPRESSION: Elevated intact parathyroid hormone levels, long-standing history of recurrent nephrolithiasis, rule out primary hyperparathyroidism   PLAN: I had a lengthy discussion with the patient and her husband today. We reviewed the above results. We reviewed her history. I am requesting a 24-hour urine collection for calcium, a 25 hydroxy vitamin D level determination, and a nuclear medicine parathyroid scan. We will try to accomplish these studies over the next few weeks as the patient will be out of the country in September.  I will review these results.  Patient will return for further discussion pending study results.   Velora Heckler, MD, FACS General & Endocrine Surgery [facility]

## 2011-04-25 ENCOUNTER — Telehealth (INDEPENDENT_AMBULATORY_CARE_PROVIDER_SITE_OTHER): Payer: Self-pay

## 2011-04-25 ENCOUNTER — Telehealth (INDEPENDENT_AMBULATORY_CARE_PROVIDER_SITE_OTHER): Payer: Self-pay | Admitting: General Surgery

## 2011-04-25 NOTE — Telephone Encounter (Signed)
Left message to refrigerate- also left lab phone number.

## 2011-04-25 NOTE — Telephone Encounter (Signed)
Patient called to get advise re: 24 hr urine. States lab did not instruct her as to whether to refrigerate or not. I asked around our office and no one was sure. I advised that she should not have to refrigerate it. After we hung up I called to lab and they advised it does need refrigerated. I called the patient to inform her and got a voicemail. I left voicemail for her to call me. I received no call back. I called two more times and received no answer. I left a message that I had advised her wrong and to refrigerate her specimen. I told her to call back to let me know she got the message.

## 2011-04-30 ENCOUNTER — Encounter (HOSPITAL_COMMUNITY): Payer: Medicare Other

## 2011-05-02 ENCOUNTER — Encounter (HOSPITAL_COMMUNITY): Payer: Medicare Other

## 2011-05-15 ENCOUNTER — Ambulatory Visit (INDEPENDENT_AMBULATORY_CARE_PROVIDER_SITE_OTHER): Payer: Medicare Other | Admitting: Internal Medicine

## 2011-05-15 ENCOUNTER — Encounter: Payer: Self-pay | Admitting: Internal Medicine

## 2011-05-15 VITALS — BP 124/80 | HR 76 | Ht 63.0 in | Wt 153.2 lb

## 2011-05-15 DIAGNOSIS — J018 Other acute sinusitis: Secondary | ICD-10-CM

## 2011-05-15 MED ORDER — AZITHROMYCIN 250 MG PO TABS: ORAL_TABLET | ORAL | Status: DC

## 2011-05-15 MED ORDER — METHYLPREDNISOLONE ACETATE 80 MG/ML IJ SUSP
80.0000 mg | Freq: Once | INTRAMUSCULAR | Status: AC
  Administered 2011-05-15: 80 mg via INTRAMUSCULAR

## 2011-05-15 NOTE — Patient Instructions (Signed)
Script for 2 sets of Zpak in one bottle- ok to take the first set now and keep the rest for your trip if needed  Depo 80

## 2011-05-15 NOTE — Assessment & Plan Note (Signed)
URI with bronchitis pattern We discussed options and will give a Z pak now and one to carry, with depo now

## 2011-05-15 NOTE — Progress Notes (Signed)
Subjective:    Patient ID: Ashley [name], female    DOB: 1940-10-01, 70 y.o.   MRN: [id number]  HPI  70 yo F former smoker followed for allergic rhinitis with hx asthmatic bronchitis and acute rhinosinusitis.  We called in a Z pak for a cold with sinus complaints- she cleared completely for a month then over last 6 weeks she has had persistent nasal congestion, rhinorhea and cough with light yellow phlegm. Valtrex helped for canker sores with her cold. She is taking an anticongestant, otc . Main complaints are watery eyes, cough and sneeze.  05/15/11-  5 yo F former smoker followed for allergic rhinitis with hx asthmatic bronchitis and acute rhinosinusitis. We had sent Z pak in February.  She is coughing again. 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 Head congestion, cough, some drainage, no fever, no sore throat, minimal wheeze. GI ok.  Pending parathyroid surgery for hx of multiple kidney stones. Not taking prednisone. Review of Systems  See HPI Constitutional:   No weight loss, night sweats,  Fevers, chills, fatigue, lassitude. HEENT:   No headaches,  Difficulty swallowing,  Tooth/dental problems,  Sore throat,  CV:  No chest pain,  Orthopnea, PND, swelling in lower extremities, anasarca, dizziness, palpitations GI  No heartburn, indigestion, abdominal pain, nausea, vomiting, diarrhea, change in bowel habits, loss of appetite Resp: No shortness of breath with exertion or at rest.  No excess mucus, no productive cough,  No non-productive cough,  No coughing up of blood.  No change in color of mucus.  No wheezing.  Skin: no rash or lesions. GU: no dysuria, change in color of urine, no urgency or frequency.  No flank pain. MS:  No joint pain or swelling.  No decreased range of motion.  No back pain. Psych:  No change in mood or affect. No depression or anxiety.  No memory loss.     Objective:   Physical Exam General-  Alert, Oriented, Affect-appropriate, Distress- none acute Skin- rash-none, lesions- none, excoriation- none Lymphadenopathy- none Head- atraumatic            Eyes- Gross vision intact, PERRLA, conjunctivae clear secretions            Ears- Hearing, canals-normal            Nose- Clear, no-Septal dev, mucus, polyps, erosion, perforation             Throat- Mallampati II , mucosa clear , drainage- none, tonsils- atrophic Neck- flexible , trachea midline, no stridor , thyroid nl, carotid no bruit Chest - symmetrical excursion , unlabored           Heart/CV- RRR , no murmur , no gallop  , no rub, nl s1 s2                           - JVD- none , edema- none, stasis changes- none, varices- none           Lung- mild dry cough, unlabored, coarse bilateral breath sounds without wheeze or rhonchi ,       dullness-none, rub- none           Chest wall-  Abd- tender-no, distended-no, bowel sounds-present, HSM- no Br/ Gen/ Rectal- Not done, not indicated Extrem- cyanosis- none, clubbing, none, atrophy- none, strength- nl Neuro- grossly intact to observation  Assessment & Plan:

## 2011-05-19 ENCOUNTER — Encounter: Payer: Self-pay | Admitting: Internal Medicine

## 2011-06-24 ENCOUNTER — Telehealth: Payer: Self-pay | Admitting: Internal Medicine

## 2011-06-24 ENCOUNTER — Encounter: Payer: Self-pay | Admitting: Internal Medicine

## 2011-06-24 ENCOUNTER — Ambulatory Visit (INDEPENDENT_AMBULATORY_CARE_PROVIDER_SITE_OTHER): Payer: Medicare Other | Admitting: Internal Medicine

## 2011-06-24 VITALS — BP 124/68 | HR 78 | Ht 63.0 in | Wt 151.0 lb

## 2011-06-24 DIAGNOSIS — J209 Acute bronchitis, unspecified: Secondary | ICD-10-CM

## 2011-06-24 MED ORDER — METHYLPREDNISOLONE ACETATE 80 MG/ML IJ SUSP
80.0000 mg | Freq: Once | INTRAMUSCULAR | Status: AC
  Administered 2011-06-24: 80 mg via INTRAMUSCULAR

## 2011-06-24 MED ORDER — LEVALBUTEROL HCL 0.63 MG/3ML IN NEBU
0.6300 mg | INHALATION_SOLUTION | Freq: Once | RESPIRATORY_TRACT | Status: AC
  Administered 2011-06-24: 0.63 mg via RESPIRATORY_TRACT

## 2011-06-24 MED ORDER — MONTELUKAST SODIUM 10 MG PO TABS: 10.0000 mg | ORAL_TABLET | Freq: Every day | ORAL | Status: DC

## 2011-06-24 NOTE — Telephone Encounter (Signed)
Pt requesting appointment to be seen today by [facility]: c/o wheezing, T-99.5, productive cough with yellow/brown mucus, bodyaches, sweats x 1 week worse since Thursday. Denies n/v/d. Has completed both rounds of zpak from [facility] and started "emergency" Cipro on Thursday bid, 1/2 Hydrocodone for rib pain from coughing, Robitussin, decongestant, and does not want to start Prednisone due to ?Osteopenia. Pt advised to come in today at 11am for an 11:15 OV to be worked in the schedule. Pt aware.

## 2011-06-24 NOTE — Progress Notes (Signed)
Patient ID: Ashley [name], female    DOB: 1941-08-30, 70 y.o.   MRN: [id number]  HPI  70 yo F former smoker followed for allergic rhinitis with hx asthmatic bronchitis and acute rhinosinusitis.  We called in a Z pak for a cold with sinus complaints- she cleared completely for a month then over last 6 weeks she has had persistent nasal congestion, rhinorhea and cough with light yellow phlegm. Valtrex helped for canker sores with her cold. She is taking an anticongestant, otc . Main complaints are watery eyes, cough and sneeze.  05/15/11-  70 yo F former smoker followed for allergic rhinitis with hx asthmatic bronchitis and acute rhinosinusitis. We had sent Z pak in February.  She is coughing again. 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 Head congestion, cough, some drainage, no fever, no sore throat, minimal wheeze. GI ok.  Pending parathyroid surgery for hx of multiple kidney stones. Not taking prednisone.  06/24/11-   70 yo F former smoker followed for allergic rhinitis with hx asthmatic bronchitis and acute rhinosinusitis. Husband here today. 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 Several on that trip shared a cold. She had not cleared completely before leaving and used Zithromax at the beginning of that trip. She has been back a week, and now in the past 5 days has hacking cough, low-grade fever, clear to yellow sputum. Blowing her nose and coughing productively especially in the last 2 days. Some wheeze. Sore throat and yesterday. She has now taken 4 days of Cipro. Chest is not as tight as 2 days ago.  Review of Systems See HPI Constitutional:   No weight loss, night sweats,  Fevers, chills, fatigue, lassitude. HEENT:   No headaches,  Difficulty swallowing,  Tooth/dental problems,  +Sore throat,  CV:  No chest pain,  Orthopnea, PND, swelling in lower extremities, anasarca, dizziness, palpitations GI  No  heartburn, indigestion, abdominal pain, nausea, vomiting, diarrhea, change in bowel habits, loss of appetite Resp: No shortness of breath with exertion or at rest.  No excess mucus, + productive cough,  No non-productive cough,  No coughing up of blood.  + change in color of mucus.  + wheezing.  Skin: no rash or lesions. GU: no dysuria, change in color of urine, no urgency or frequency.  No flank pain. MS:  No joint pain or swelling.  No decreased range of motion.  No back pain. Psych:  No change in mood or affect. No depression or anxiety.  No memory loss.     Objective:   Physical Exam General- Alert, Oriented, Affect-appropriate, Distress- none acute Skin- rash-none, lesions- none, excoriation- none Lymphadenopathy- none Head- atraumatic            Eyes- Gross vision intact, PERRLA, conjunctivae clear secretions            Ears- Hearing, canals-normal            Nose- Clear, no-Septal dev, mucus, polyps, erosion, perforation             Throat- Mallampati II , mucosa clear , drainage- none, tonsils- atrophic Neck- flexible , trachea midline, no stridor , thyroid nl, carotid no bruit Chest - symmetrical excursion , unlabored           Heart/CV- RRR , no murmur , no gallop  , no rub, nl s1 s2                           -  JVD- none , edema- none, stasis changes- none, varices- none           Lung- moderate wheezy cough, unlabored, coarse bilateral breath sounds without  rhonchi ,     dullness-none, rub- none           Chest wall-  Abd- tender-no, distended-no, bowel sounds-present, HSM- no Br/ Gen/ Rectal- Not done, not indicated Extrem- cyanosis- none, clubbing, none, atrophy- none, strength- nl Neuro- grossly intact to observation

## 2011-06-24 NOTE — Patient Instructions (Signed)
Try singulair/ montelukast as an airway anti-inflammatory to stabilize your airway. It won't prevent colds, but might blunt them  Neb xop 0.63  Depo 80

## 2011-06-25 ENCOUNTER — Other Ambulatory Visit: Payer: Self-pay | Admitting: Family Medicine

## 2011-06-25 DIAGNOSIS — Z1231 Encounter for screening mammogram for malignant neoplasm of breast: Secondary | ICD-10-CM

## 2011-06-25 NOTE — Assessment & Plan Note (Signed)
Acute upper respiratory infection with tracheobronchitis. Since she started Cipro, she will take the last 2 days to finish that course. Discussed fluids supportive care and Mucinex. Plan nebulizer, Depo-Medrol

## 2011-06-27 ENCOUNTER — Ambulatory Visit: Payer: Medicare Other | Admitting: Internal Medicine

## 2011-06-27 LAB — POCT I-STAT 4, (NA,K, GLUC, HGB,HCT)
Glucose, Bld: 96
Operator id: 268271
Sodium: 137

## 2011-07-01 ENCOUNTER — Telehealth: Payer: Self-pay | Admitting: Internal Medicine

## 2011-07-01 MED ORDER — PREDNISONE 10 MG PO TABS: ORAL_TABLET | ORAL | Status: DC

## 2011-07-01 MED ORDER — PROMETHAZINE-CODEINE 6.25-10 MG/5ML PO SYRP: 5.0000 mL | ORAL_SOLUTION | Freq: Four times a day (QID) | ORAL | Status: AC | PRN

## 2011-07-01 NOTE — Telephone Encounter (Signed)
Pt states she is getting worse from last OV. Pt c/o cough w/ yellow phlem, some wheezing, chest tightness. Pt states at OV Dr. Maple Hudson wanted to wait before he gave her an rx for prednisone to see how she does. Pt states she believes she needs the rx now bc she is getting worse. Please advise Dr. Martin Majestic  No Known Allergies   Carver Fila, CMA

## 2011-07-01 NOTE — Telephone Encounter (Signed)
Pt aware of cdy recs and [facility] was called [facility] for [facility].

## 2011-07-01 NOTE — Telephone Encounter (Signed)
Pt would like to know that status of cough syrup & can be reached at [phone].  Ashley [name]

## 2011-07-01 NOTE — Telephone Encounter (Signed)
Dr. Maple Hudson, pt calling back -- she would also like cough syrup.  Pls advise.  Thanks!

## 2011-07-01 NOTE — Telephone Encounter (Signed)
I spoke with pt and advised her rx for prednisone have been called in for her. Pt states she forgot to mention earlier that she would also like some cough syrup called in for her as well. Please advise Dr. Martin Majestic  No Known Allergies  Carver Fila, CMA

## 2011-07-01 NOTE — Telephone Encounter (Signed)
Per CY-okay to give Prednisone 10mg #20 take 4 x 2 days, 3 x 2 days, 2 x 2 days, 1 x 2 days, then stop no refills.  

## 2011-07-01 NOTE — Telephone Encounter (Signed)
Per CY-okay to give Phenergan with Codeine cough syrup #257ml take 1 tsp every 6 hours prn cough no refills.

## 2011-07-01 NOTE — Telephone Encounter (Signed)
Addended by: Tommie Sams on: 07/01/2011 03:38 PM   Modules accepted: Orders

## 2011-07-03 LAB — POCT I-STAT 4, (NA,K, GLUC, HGB,HCT)
Glucose, Bld: 105 — ABNORMAL HIGH
HCT: 45
Hemoglobin: 15.3 — ABNORMAL HIGH
Operator id: 114531
Potassium: 4
Sodium: 135

## 2011-07-08 ENCOUNTER — Telehealth (INDEPENDENT_AMBULATORY_CARE_PROVIDER_SITE_OTHER): Payer: Self-pay | Admitting: General Surgery

## 2011-07-08 NOTE — Telephone Encounter (Signed)
Patient called, has a kidney stone blocking her ureter and having some bleeding. Wants to delay her sestimibi scan. I called [phone] and cancelled her test. She will call back when ready to reschedule.

## 2011-07-09 ENCOUNTER — Encounter (HOSPITAL_COMMUNITY): Payer: Medicare Other

## 2011-07-19 ENCOUNTER — Encounter (HOSPITAL_COMMUNITY): Payer: Self-pay

## 2011-07-19 NOTE — Pre-Procedure Instructions (Signed)
No aspirin, advil , or toradol until after litho No fish oil or MVI until after litho Take laxative as instructed in blue folder

## 2011-07-20 ENCOUNTER — Other Ambulatory Visit (HOSPITAL_COMMUNITY): Payer: Self-pay | Admitting: *Deleted

## 2011-07-21 NOTE — H&P (Signed)
Ashley [name] is an 70 y.o. female.   Chief Complaint: I have a right kidney stone.  HPI: Ashley [name] is a 70 yo WF with a history of stones who now has  A 6x90mm right renal pelvic stone that is going to require ESWL for management.  She has hyperparathyroidism and is seeing Dr. Gerrit Friends for that problem.  She was symptom free at her last visit.  Past Medical History  Diagnosis Date  . Chronic rhinosinusitis     allergy skin testing positive 10-05-03- did not try allergy vaccine  . FH: cataracts   . Glaucoma   . Nausea & vomiting   . Back pain   . Family history of breast cancer     mother  . Asthmatic bronchitis     treated last 2 weeks ago  . Kidney stones   . Arthritis     back and neck  . Hypertension     denies other cardiac hx    Past Surgical History  Procedure Date  . Lumbar laminectomy   . [place] luc     sinus drainage procedure  . Tonsilectomy, adenoidectomy, bilateral myringotomy and tubes   . Bunionectomy   . Hernia repair     age 24  . Abdominal hysterectomy     in 40s  . Extracorporeal shock wave lithotripsy about 1997/ Dr Wanda Plump    Never completed procedure-could not tolerate pain. Had seizures due to pain    Family History  Problem Relation Age of Onset  . COPD Mother    Social History:  reports that she quit smoking about 25 years ago. Her smoking use included Cigarettes. She does not have any smokeless tobacco history on file. She reports that she drinks alcohol. Her drug history not on file.  Allergies: No Known Allergies  No current facility-administered medications on file as of .   Medications Prior to Admission  Medication Sig Dispense Refill  . celecoxib (CELEBREX) 100 MG capsule Take 100 mg by mouth 2 (two) times daily as needed. As needed      . Naproxen Sodium (ALEVE) 220 MG CAPS 220 capsules 2 (two) times daily as needed. Take as directed per bottle as needed      . albuterol (PROVENTIL HFA) 108 (90 BASE) MCG/ACT inhaler Inhale 2 puffs  into the lungs 4 (four) times daily as needed.        Marland Kitchen aliskiren (TEKTURNA) 150 MG tablet Take 150 mg by mouth daily.       Marland Kitchen desipramine (NORPRAMIN) 25 MG tablet Take 25 mg by mouth daily.       Marland Kitchen estradiol (ESTRACE) 0.5 MG tablet Take 0.5 mg by mouth daily.       . fish oil-omega-3 fatty acids 1000 MG capsule Take 2 g by mouth daily.       Marland Kitchen HYDROcodone-acetaminophen (NORCO) 5-325 MG per tablet Take 0.5 tablets by mouth every 4 (four) hours as needed.       . montelukast (SINGULAIR) 10 MG tablet Take 1 tablet (10 mg total) by mouth daily.  30 tablet  prn  . Multiple Vitamin (MULTIVITAMIN PO) Take by mouth daily.       Marland Kitchen oxyCODONE-acetaminophen (TYLOX) 5-500 MG per capsule Take 1 capsule by mouth as needed.       . promethazine (PHENERGAN) 25 MG tablet Take 25 mg by mouth every 6 (six) hours as needed.       . promethazine-codeine (PHENERGAN WITH CODEINE) 6.25-10 MG/5ML syrup Take 5 mLs  by mouth every 6 (six) hours as needed for cough.  200 mL  0    No results found for this or any previous visit (from the past 48 hour(s)). No results found.  Review of Systems  Constitutional: Positive for fever.  Respiratory: Positive for cough.   Genitourinary: Positive for urgency and hematuria.  All other systems reviewed and are negative.    There were no vitals taken for this visit. Physical Exam  Constitutional: She appears well-developed and well-nourished. No distress.  Cardiovascular: Normal rate, regular rhythm and normal heart sounds.   Respiratory: Effort normal and breath sounds normal.  GI: Soft. Bowel sounds are normal. There is no tenderness.     Assessment/Plan She has a right renal pelvic stone 6x46mm in size and needs ESWL.   The risks including but not limited to bleeding, infection, obstructing fragments, need for second procedures, renal and organ injury that could be life threatening, thrombotic events and sedation risks reviewed.   I have discussed her expected outcome with  a 90-95% fragmentation rate and a 10-15% chance that a secondary procedure might be needed.  Shun Pletz J 07/21/2011, 10:12 AM

## 2011-07-22 ENCOUNTER — Encounter (HOSPITAL_COMMUNITY)
Admission: RE | Disposition: A | Discharge status: Home / Self Care | Payer: Self-pay | Source: Ambulatory Visit | Attending: Urology

## 2011-07-22 ENCOUNTER — Ambulatory Visit (HOSPITAL_COMMUNITY)
Admission: RE | Admit: 2011-07-22 | Discharge: 2011-07-22 | Disposition: A | Discharge status: Home / Self Care | Payer: Medicare Other | Source: Ambulatory Visit | Attending: Urology | Admitting: Urology

## 2011-07-22 ENCOUNTER — Ambulatory Visit: Admit: 2011-07-22 | Payer: Self-pay | Admitting: Urology

## 2011-07-22 DIAGNOSIS — N201 Calculus of ureter: Secondary | ICD-10-CM | POA: Diagnosis present

## 2011-07-22 DIAGNOSIS — I10 Essential (primary) hypertension: Secondary | ICD-10-CM | POA: Insufficient documentation

## 2011-07-22 DIAGNOSIS — J45909 Unspecified asthma, uncomplicated: Secondary | ICD-10-CM | POA: Insufficient documentation

## 2011-07-22 DIAGNOSIS — M129 Arthropathy, unspecified: Secondary | ICD-10-CM | POA: Insufficient documentation

## 2011-07-22 SURGERY — LITHOTRIPSY, ESWL
Anesthesia: LOCAL | Laterality: Right

## 2011-07-22 MED ORDER — LIDOCAINE HCL 1 % IJ SOLN
INTRAMUSCULAR | Status: AC
  Filled 2011-07-22: qty 20

## 2011-07-22 MED ORDER — DIPHENHYDRAMINE HCL 25 MG PO CAPS
ORAL_CAPSULE | ORAL | Status: AC
  Filled 2011-07-22: qty 1

## 2011-07-22 MED ORDER — DIAZEPAM 5 MG PO TABS
10.0000 mg | ORAL_TABLET | ORAL | Status: AC
  Administered 2011-07-22: 10 mg via ORAL

## 2011-07-22 MED ORDER — DEXTROSE-NACL 5-0.45 % IV SOLN
INTRAVENOUS | Status: DC
  Administered 2011-07-22: 08:00:00 via INTRAVENOUS

## 2011-07-22 MED ORDER — DIPHENHYDRAMINE HCL 25 MG PO CAPS
25.0000 mg | ORAL_CAPSULE | ORAL | Status: AC
  Administered 2011-07-22: 25 mg via ORAL

## 2011-07-22 MED ORDER — CIPROFLOXACIN HCL 500 MG PO TABS
ORAL_TABLET | ORAL | Status: AC
  Filled 2011-07-22: qty 1

## 2011-07-22 MED ORDER — CIPROFLOXACIN HCL 500 MG PO TABS
500.0000 mg | ORAL_TABLET | ORAL | Status: AC
  Administered 2011-07-22: 500 mg via ORAL

## 2011-07-22 MED ORDER — DIAZEPAM 5 MG PO TABS
ORAL_TABLET | ORAL | Status: AC
  Filled 2011-07-22: qty 2

## 2011-07-22 NOTE — Pre-Procedure Instructions (Signed)
Patient completed laxative as directed. NPO past MN except tool Oxycodone with sip of water. Denies taking any aspirin products, vitamins and herbal medication

## 2011-07-22 NOTE — Progress Notes (Signed)
Pt Vital signs stable.  Pt dc'd to home with husband.

## 2011-07-22 NOTE — Progress Notes (Signed)
Pt up and ambulated to BR and voided moderate amount of pink/bloody urine.  Pt tolerated well.

## 2011-07-22 NOTE — Procedures (Signed)
Pt sitting supine in recliner tolerating po fluids.  Husband at pt's bedside. Call bell within reach.

## 2011-07-22 NOTE — Brief Op Note (Signed)
07/22/2011  8:48 AM  PATIENT:  Ashley [name]  70 y.o. female  PRE-OPERATIVE DIAGNOSIS:  Right Proximal Stone  POST-OPERATIVE DIAGNOSIS:  Same PROCEDURE:  Procedure(s): EXTRACORPOREAL SHOCK WAVE LITHOTRIPSY (ESWL)  SURGEON:  Surgeon(s): Anner Crete  PHYSICIAN ASSISTANT:   ASSISTANTS: none   ANESTHESIA:   IV sedation  EBL:     BLOOD ADMINISTERED:none  DRAINS: none   LOCAL MEDICATIONS USED:  NONE  SPECIMEN:  No Specimen  DISPOSITION OF SPECIMEN:  N/A  COUNTS:  YES  TOURNIQUET:  * No tourniquets in log *  DICTATION: .Note written in paper chart   See [facility] ESWL op note.  PLAN OF CARE: Discharge to home after PACU  PATIENT DISPOSITION:  Short Stay   Delay start of Pharmacological VTE agent (>24hrs) due to surgical blood loss or risk of bleeding:  not applicable

## 2011-08-02 ENCOUNTER — Ambulatory Visit
Admission: RE | Admit: 2011-08-02 | Discharge: 2011-08-02 | Disposition: A | Discharge status: Home / Self Care | Payer: Medicare Other | Source: Ambulatory Visit | Attending: Family Medicine | Admitting: Family Medicine

## 2011-08-02 DIAGNOSIS — Z1231 Encounter for screening mammogram for malignant neoplasm of breast: Secondary | ICD-10-CM

## 2011-08-26 ENCOUNTER — Other Ambulatory Visit: Payer: Self-pay | Admitting: Dermatology

## 2011-09-18 DIAGNOSIS — M5030 Other cervical disc degeneration, unspecified cervical region: Secondary | ICD-10-CM | POA: Diagnosis not present

## 2011-09-18 DIAGNOSIS — M5137 Other intervertebral disc degeneration, lumbosacral region: Secondary | ICD-10-CM | POA: Diagnosis not present

## 2011-09-18 DIAGNOSIS — M9981 Other biomechanical lesions of cervical region: Secondary | ICD-10-CM | POA: Diagnosis not present

## 2011-09-18 DIAGNOSIS — M999 Biomechanical lesion, unspecified: Secondary | ICD-10-CM | POA: Diagnosis not present

## 2011-09-26 DIAGNOSIS — M5137 Other intervertebral disc degeneration, lumbosacral region: Secondary | ICD-10-CM | POA: Diagnosis not present

## 2011-09-26 DIAGNOSIS — M9981 Other biomechanical lesions of cervical region: Secondary | ICD-10-CM | POA: Diagnosis not present

## 2011-09-26 DIAGNOSIS — M999 Biomechanical lesion, unspecified: Secondary | ICD-10-CM | POA: Diagnosis not present

## 2011-09-26 DIAGNOSIS — M5030 Other cervical disc degeneration, unspecified cervical region: Secondary | ICD-10-CM | POA: Diagnosis not present

## 2011-09-30 DIAGNOSIS — M5030 Other cervical disc degeneration, unspecified cervical region: Secondary | ICD-10-CM | POA: Diagnosis not present

## 2011-09-30 DIAGNOSIS — M9981 Other biomechanical lesions of cervical region: Secondary | ICD-10-CM | POA: Diagnosis not present

## 2011-09-30 DIAGNOSIS — M5137 Other intervertebral disc degeneration, lumbosacral region: Secondary | ICD-10-CM | POA: Diagnosis not present

## 2011-09-30 DIAGNOSIS — M999 Biomechanical lesion, unspecified: Secondary | ICD-10-CM | POA: Diagnosis not present

## 2011-10-24 DIAGNOSIS — E7800 Pure hypercholesterolemia, unspecified: Secondary | ICD-10-CM | POA: Diagnosis not present

## 2011-11-12 DIAGNOSIS — M999 Biomechanical lesion, unspecified: Secondary | ICD-10-CM | POA: Diagnosis not present

## 2011-11-12 DIAGNOSIS — M9981 Other biomechanical lesions of cervical region: Secondary | ICD-10-CM | POA: Diagnosis not present

## 2011-11-12 DIAGNOSIS — M5030 Other cervical disc degeneration, unspecified cervical region: Secondary | ICD-10-CM | POA: Diagnosis not present

## 2011-11-12 DIAGNOSIS — M5137 Other intervertebral disc degeneration, lumbosacral region: Secondary | ICD-10-CM | POA: Diagnosis not present

## 2011-11-20 DIAGNOSIS — R05 Cough: Secondary | ICD-10-CM | POA: Diagnosis not present

## 2011-11-20 DIAGNOSIS — R1013 Epigastric pain: Secondary | ICD-10-CM | POA: Diagnosis not present

## 2011-11-22 ENCOUNTER — Encounter: Payer: Self-pay | Admitting: Gastroenterology

## 2011-11-22 DIAGNOSIS — N200 Calculus of kidney: Secondary | ICD-10-CM | POA: Diagnosis not present

## 2011-11-22 DIAGNOSIS — R1084 Generalized abdominal pain: Secondary | ICD-10-CM | POA: Diagnosis not present

## 2011-11-25 DIAGNOSIS — L821 Other seborrheic keratosis: Secondary | ICD-10-CM | POA: Diagnosis not present

## 2011-11-25 DIAGNOSIS — L570 Actinic keratosis: Secondary | ICD-10-CM | POA: Diagnosis not present

## 2011-11-25 DIAGNOSIS — L819 Disorder of pigmentation, unspecified: Secondary | ICD-10-CM | POA: Diagnosis not present

## 2011-11-25 DIAGNOSIS — L578 Other skin changes due to chronic exposure to nonionizing radiation: Secondary | ICD-10-CM | POA: Diagnosis not present

## 2011-11-25 DIAGNOSIS — Z85828 Personal history of other malignant neoplasm of skin: Secondary | ICD-10-CM | POA: Diagnosis not present

## 2011-12-04 DIAGNOSIS — M5137 Other intervertebral disc degeneration, lumbosacral region: Secondary | ICD-10-CM | POA: Diagnosis not present

## 2011-12-04 DIAGNOSIS — M9981 Other biomechanical lesions of cervical region: Secondary | ICD-10-CM | POA: Diagnosis not present

## 2011-12-04 DIAGNOSIS — M5030 Other cervical disc degeneration, unspecified cervical region: Secondary | ICD-10-CM | POA: Diagnosis not present

## 2011-12-04 DIAGNOSIS — M999 Biomechanical lesion, unspecified: Secondary | ICD-10-CM | POA: Diagnosis not present

## 2011-12-06 ENCOUNTER — Ambulatory Visit (INDEPENDENT_AMBULATORY_CARE_PROVIDER_SITE_OTHER): Payer: Medicare Other | Admitting: Gastroenterology

## 2011-12-06 ENCOUNTER — Encounter: Payer: Self-pay | Admitting: Gastroenterology

## 2011-12-06 VITALS — BP 126/72 | HR 80 | Ht 63.0 in | Wt 153.0 lb

## 2011-12-06 DIAGNOSIS — R1031 Right lower quadrant pain: Secondary | ICD-10-CM

## 2011-12-06 DIAGNOSIS — Z9089 Acquired absence of other organs: Secondary | ICD-10-CM

## 2011-12-06 DIAGNOSIS — R1032 Left lower quadrant pain: Secondary | ICD-10-CM | POA: Insufficient documentation

## 2011-12-06 DIAGNOSIS — R109 Unspecified abdominal pain: Secondary | ICD-10-CM

## 2011-12-06 DIAGNOSIS — Z8719 Personal history of other diseases of the digestive system: Secondary | ICD-10-CM | POA: Insufficient documentation

## 2011-12-06 DIAGNOSIS — K5730 Diverticulosis of large intestine without perforation or abscess without bleeding: Secondary | ICD-10-CM

## 2011-12-06 DIAGNOSIS — Z9049 Acquired absence of other specified parts of digestive tract: Secondary | ICD-10-CM | POA: Insufficient documentation

## 2011-12-06 DIAGNOSIS — N200 Calculus of kidney: Secondary | ICD-10-CM | POA: Diagnosis not present

## 2011-12-06 MED ORDER — MESALAMINE ER 0.375 G PO CP24: 375.0000 mg | ORAL_CAPSULE | Freq: Once | ORAL | Status: DC

## 2011-12-06 NOTE — Patient Instructions (Signed)
We have sent in your prescription to your pharmacy\ You watched a movie on Diverticulosis today Make a follow up appointment in 3 weeks High Fiber Diet A high fiber diet changes your normal diet to include more whole grains, legumes, fruits, and vegetables. Changes in the diet involve replacing refined carbohydrates with unrefined foods. The calorie level of the diet is essentially unchanged. The Dietary Reference Intake (recommended amount) for adult males is 38 g per day. For adult females, it is 25 g per day. Pregnant and lactating women should consume 28 g of fiber per day. Fiber is the intact part of a plant that is not broken down during digestion. Functional fiber is fiber that has been isolated from the plant to provide a beneficial effect in the body. PURPOSE  Increase stool bulk.   Ease and regulate bowel movements.   Lower cholesterol.  INDICATIONS THAT YOU NEED MORE FIBER  Constipation and hemorrhoids.   Uncomplicated diverticulosis (intestine condition) and irritable bowel syndrome.   Weight management.   As a protective measure against hardening of the arteries (atherosclerosis), diabetes, and cancer.  NOTE OF CAUTION If you have a digestive or bowel problem, ask your caregiver for advice before adding high fiber foods to your diet. Some of the following medical problems are such that a high fiber diet should not be used without consulting your caregiver:  Acute diverticulitis (intestine infection).   Partial small bowel obstructions.   Complicated diverticular disease involving bleeding, rupture (perforation), or abscess (boil, furuncle).   Presence of autonomic neuropathy (nerve damage) or gastric paresis (stomach cannot empty itself).  GUIDELINES FOR INCREASING FIBER  Start adding fiber to the diet slowly. A gradual increase of about 5 more grams (2 slices of whole-wheat bread, 2 servings of most fruits or vegetables, or 1 bowl of high fiber cereal) per day is best.  Too rapid an increase in fiber may result in constipation, flatulence, and bloating.   Drink enough water and fluids to keep your urine clear or pale yellow. Water, juice, or caffeine-free drinks are recommended. Not drinking enough fluid may cause constipation.   Eat a variety of high fiber foods rather than one type of fiber.   Try to increase your intake of fiber through using high fiber foods rather than fiber pills or supplements that contain small amounts of fiber.   The goal is to change the types of food eaten. Do not supplement your present diet with high fiber foods, but replace foods in your present diet.  INCLUDE A VARIETY OF FIBER SOURCES  Replace refined and processed grains with whole grains, canned fruits with fresh fruits, and incorporate other fiber sources. White rice, white breads, and most bakery goods contain little or no fiber.   Brown whole-grain rice, buckwheat oats, and many fruits and vegetables are all good sources of fiber. These include: broccoli, Brussels sprouts, cabbage, cauliflower, beets, sweet potatoes, white potatoes (skin on), carrots, tomatoes, eggplant, squash, berries, fresh fruits, and dried fruits.   Cereals appear to be the richest source of fiber. Cereal fiber is found in whole grains and bran. Bran is the fiber-rich outer coat of cereal grain, which is largely removed in refining. In whole-grain cereals, the bran remains. In breakfast cereals, the largest amount of fiber is found in those with "bran" in their names. The fiber content is sometimes indicated on the label.   You may need to include additional fruits and vegetables each day.   In baking, for 1 cup white  flour, you may use the following substitutions:   1 cup whole-wheat flour minus 2 tbs.    cup white flour plus  cup whole-wheat flour.  Document Released: 09/02/2005 Document Revised: 08/22/2011 Document Reviewed: 07/11/2009 [facility] Patient Information 2012 [place],  [place].  Diverticulosis Diverticulosis is a common condition that develops when small pouches (diverticula) form in the wall of the colon. The risk of diverticulosis increases with age. It happens more often in people who eat a low-fiber diet. Most individuals with diverticulosis have no symptoms. Those individuals with symptoms usually experience abdominal pain, constipation, or loose stools (diarrhea). HOME CARE INSTRUCTIONS   Increase the amount of fiber in your diet as directed by your caregiver or dietician. This may reduce symptoms of diverticulosis.   Your caregiver may recommend taking a dietary fiber supplement.   Drink at least 6 to 8 glasses of water each day to prevent constipation.   Try not to strain when you have a bowel movement.   Your caregiver may recommend avoiding nuts and seeds to prevent complications, although this is still an uncertain benefit.   Only take over-the-counter or prescription medicines for pain, discomfort, or fever as directed by your caregiver.  FOODS WITH HIGH FIBER CONTENT INCLUDE:  Fruits. Apple, peach, pear, tangerine, raisins, prunes.   Vegetables. Brussels sprouts, asparagus, broccoli, cabbage, carrot, cauliflower, romaine lettuce, spinach, summer squash, tomato, winter squash, zucchini.   Starchy Vegetables. Baked beans, kidney beans, lima beans, split peas, lentils, potatoes (with skin).   Grains. Whole wheat bread, brown rice, bran flake cereal, plain oatmeal, white rice, shredded wheat, bran muffins.  SEEK IMMEDIATE MEDICAL CARE IF:   You develop increasing pain or severe bloating.   You have an oral temperature above 102 F (38.9 C), not controlled by medicine.   You develop vomiting or bowel movements that are bloody or black.  Document Released: 05/30/2004 Document Revised: 08/22/2011 Document Reviewed: 01/31/2010 [facility] Patient Information 2012 [place], [place].

## 2011-12-06 NOTE — Progress Notes (Signed)
History of Present Illness:  This is a pleasant 71 year old Caucasian female with a history of IBS and diverticulosis with 2 apparent episodes of diverticulitis recently and one that occurred in April 2010, but review of the CT scan at that time did not show diverticulitis but multiple kidney stones. She recently was on a trip to [place], and had several days of lower abdominal pain and diarrhea which have resolved. She had a colonoscopy 3 years ago with Dr. Loreta Ave which was unremarkable except for scattered left colon diverticulosis. Patient does try to eat a high fiber diet, but recently used some peanuts which may have precipitated a mild flare. There is no history of rectal bleeding, fever, chills, or systemic complaints. Her appetite is good her weight is stable. She's had previous pelvic surgery with complications, and apparently has had multiple pelvic adhesions. Patient also status postcholecystectomy.  There is some question as to whether or not she has hyperparathyroidism, per her multiple kidney stones and osteoporosis. Is currently under endocrinology evaluation. Patient has a  constant vague lower abdominal discomfort with gas and bloating t, and has been having rather frequent attacks of ureteral stones, and has had multiple lithotripsy procedures. She denies any specific food intolerances. Family history is noncontributory in terms of colon polyps or colon cancer. She denies the upper gastrointestinal or hepatobiliary complaints. She does use a fair amount of NSAIDs with Celebrex because of chronic back pain, and also use when necessary Aleve and oxycodone. Previous pulmonary evaluations are consistent with asthmatic bronchitis, and she apparently is on a fairly frequent doses of corticosteroids. She denies recent antibiotic use.  I have reviewed this patient's present history, medical and surgical past history, allergies and medications.     ROS: The remainder of the 10 point ROS is  negative.. Complains of polyarticular arthritis, chronic low back pain, dry nonproductive cough, fatigue, periodic severe myalgias, periodic nosebleeds, mild hearing impairment. She denies any cardiovascular symptoms.     Physical Exam: General well developed well nourished patient in no acute distress, appearing her stated age Eyes PERRLA, no icterus, fundoscopic exam per opthamologist Skin no lesions noted Neck supple, no adenopathy, no thyroid enlargement, no tenderness Chest clear to percussion and auscultation Heart no significant murmurs, gallops or rubs noted Abdomen no hepatosplenomegaly masses or tenderness, BS normal.  Extremities no acute joint lesions, edema, phlebitis or evidence of cellulitis. Neurologic patient oriented x 3, cranial nerves intact, no focal neurologic deficits noted. Psychological mental status normal and normal affect.  Assessment and plan: Symptomatic diverticulosis coli with possible segmental colitis. Her symptoms do not seem consistent with ischemic colitis. I have placed her on a high-fiber diet with daily Metamucil, she is to continue liberal by mouth fluids, and I placed her on Apriso 375 mg 4 tablets a day. She saw her patient education video on diverticulosis and its management, and she is to follow a high fiber diet to avoid nuts and seeds. Do not think she needs colonoscopy at this time unless she fails to respond to medical management. Today, she denies any dyspepsia, reflux symptoms, or other upper GI complaints. We'll see her back in followup in 3 weeks' time. I have urged her to continue her endocrine workup for possible hyperparathyroidism.  No diagnosis found.

## 2011-12-10 ENCOUNTER — Telehealth: Payer: Self-pay | Admitting: Gastroenterology

## 2011-12-10 DIAGNOSIS — R1010 Upper abdominal pain, unspecified: Secondary | ICD-10-CM

## 2011-12-10 NOTE — Telephone Encounter (Signed)
Knife like, severe "torso" pain started about 1-2 hours ago.  No fevers, chills.  Mild nausea.  Never had pain like this before.  I recommended ER evaluation. If she doesn't feel better in next 20 minutes she will go to ER,  and if she does feel better she will call Dr. Jarold Motto tomorrow to see if she can be seen for out patient evaluation.

## 2011-12-11 ENCOUNTER — Ambulatory Visit (INDEPENDENT_AMBULATORY_CARE_PROVIDER_SITE_OTHER)
Admission: RE | Admit: 2011-12-11 | Discharge: 2011-12-11 | Disposition: A | Discharge status: Home / Self Care | Payer: Medicare Other | Source: Ambulatory Visit | Attending: Gastroenterology | Admitting: Gastroenterology

## 2011-12-11 DIAGNOSIS — R109 Unspecified abdominal pain: Secondary | ICD-10-CM | POA: Diagnosis not present

## 2011-12-11 DIAGNOSIS — R1010 Upper abdominal pain, unspecified: Secondary | ICD-10-CM

## 2011-12-11 DIAGNOSIS — R110 Nausea: Secondary | ICD-10-CM | POA: Diagnosis not present

## 2011-12-11 DIAGNOSIS — N200 Calculus of kidney: Secondary | ICD-10-CM | POA: Diagnosis not present

## 2011-12-11 MED ORDER — IOHEXOL 300 MG/ML  SOLN
100.0000 mL | Freq: Once | INTRAMUSCULAR | Status: AC | PRN
  Administered 2011-12-11: 100 mL via INTRAVENOUS

## 2011-12-11 NOTE — Telephone Encounter (Signed)
Ct scan if still in pain

## 2011-12-11 NOTE — Telephone Encounter (Signed)
Addended by: Florene Glen on: 12/11/2011 09:29 AM   Modules accepted: Orders

## 2011-12-11 NOTE — Telephone Encounter (Signed)
Pt's husband reports pt is resting; took an Oxycontin? He reports the pain is above the navel and radiates from the r to l side all the way to her shoulders. Informed husband to pick up contrast here; npo 4 hours or 11am, drink contrast at 1 pm and 2 pm for 3pm scan. Husband stated understanding.

## 2011-12-12 ENCOUNTER — Encounter: Payer: Self-pay | Admitting: Gastroenterology

## 2011-12-12 ENCOUNTER — Telehealth: Payer: Self-pay | Admitting: Gastroenterology

## 2011-12-12 ENCOUNTER — Ambulatory Visit (INDEPENDENT_AMBULATORY_CARE_PROVIDER_SITE_OTHER): Payer: Medicare Other | Admitting: Gastroenterology

## 2011-12-12 ENCOUNTER — Other Ambulatory Visit (INDEPENDENT_AMBULATORY_CARE_PROVIDER_SITE_OTHER): Payer: Medicare Other

## 2011-12-12 VITALS — BP 128/68 | HR 96 | Ht 63.0 in | Wt 153.2 lb

## 2011-12-12 DIAGNOSIS — D509 Iron deficiency anemia, unspecified: Secondary | ICD-10-CM

## 2011-12-12 DIAGNOSIS — K5730 Diverticulosis of large intestine without perforation or abscess without bleeding: Secondary | ICD-10-CM

## 2011-12-12 DIAGNOSIS — R109 Unspecified abdominal pain: Secondary | ICD-10-CM

## 2011-12-12 DIAGNOSIS — R6889 Other general symptoms and signs: Secondary | ICD-10-CM | POA: Diagnosis not present

## 2011-12-12 LAB — HEPATIC FUNCTION PANEL
ALT: 40 U/L — ABNORMAL HIGH (ref 0–35)
Total Bilirubin: 1.1 mg/dL (ref 0.3–1.2)

## 2011-12-12 LAB — CBC WITH DIFFERENTIAL/PLATELET
Basophils Relative: 0.4 % (ref 0.0–3.0)
Eosinophils Relative: 2.8 % (ref 0.0–5.0)
Hemoglobin: 15.9 g/dL — ABNORMAL HIGH (ref 12.0–15.0)
Lymphocytes Relative: 20.6 % (ref 12.0–46.0)
MCV: 89.6 fl (ref 78.0–100.0)
Monocytes Absolute: 0.4 10*3/uL (ref 0.1–1.0)
Neutro Abs: 5 10*3/uL (ref 1.4–7.7)
Neutrophils Relative %: 71 % (ref 43.0–77.0)
RBC: 5.13 Mil/uL — ABNORMAL HIGH (ref 3.87–5.11)
WBC: 7 10*3/uL (ref 4.5–10.5)

## 2011-12-12 LAB — VITAMIN B12: Vitamin B-12: 1268 pg/mL — ABNORMAL HIGH (ref 211–911)

## 2011-12-12 LAB — URINALYSIS, ROUTINE W REFLEX MICROSCOPIC
Ketones, ur: 15
Urine Glucose: NEGATIVE
Urobilinogen, UA: 0.2 (ref 0.0–1.0)

## 2011-12-12 LAB — FOLATE: Folate: >24.8 ng/mL (ref >5.9)

## 2011-12-12 LAB — BASIC METABOLIC PANEL
CO2: 28 mEq/L (ref 19–32)
Calcium: 9.7 mg/dL (ref 8.4–10.5)
GFR: 80.23 mL/min (ref >60.00)
Potassium: 4.4 mEq/L (ref 3.5–5.1)
Sodium: 139 mEq/L (ref 135–145)

## 2011-12-12 LAB — AMYLASE: Amylase: 59 U/L (ref 27–131)

## 2011-12-12 LAB — IBC PANEL
Saturation Ratios: 21.5 % (ref 20.0–50.0)
Transferrin: 296 mg/dL (ref 212.0–360.0)

## 2011-12-12 MED ORDER — SUCRALFATE 1 GM/10ML PO SUSP: ORAL | Status: DC

## 2011-12-12 MED ORDER — MOVIPREP 100 G PO SOLR: 1.0000 | Freq: Once | ORAL | Status: DC

## 2011-12-12 NOTE — Patient Instructions (Signed)
Your procedure has been scheduled for 12/18/2011, please follow the seperate instructions.  Please go to the basement today for your labs.  STOP the Cipro, Take Carafate after meals and at bedtime, Your prescription(s) have been sent to you pharmacy.

## 2011-12-12 NOTE — Progress Notes (Signed)
This is a 71 year old Caucasian female bothered by recurrent nephrolithiasis requiring periodic narcotic use. I recently saw her because of some vague lower abdominal discomfort in the face of previously diagnosed diverticulosis. I felt that she may have segmental colitis and placed her on oral aminosalicylate's which she feels have exacerbated her problem. In fact, 24 hours ago she had a severe episode of mid abdominal pain radiating into her chest, shoulders and arms which she described as an extremely sharp pain without nausea and vomiting, hepatobiliary or reflux symptoms, change in bowel habits, melena or hematochezia. She is status post cholecystectomy. Because of the severity of her pain we obtained CT scan of the abdomen and pelvis which is completely unremarkable except for a cecum in the low pelvic area. However, she has had no lower gastrointestinal or specific genitourinary symptomatology. Patient is on Celebrex 200 mg twice a day because of chronic low back pain, also uses when necessary Aleve. Again, she had a negative colonoscopy with Dr. Loreta Ave 3 years ago.  Current Medications, Allergies, Past Medical History, Past Surgical History, Family History and Social History were reviewed in [employer] record.  Pertinent Review of Systems Negative.... increased stress from her job and recent birth of a granddaughter.   Physical Exam: Healthy-appearing patient in no acute distress. Blood pressure 128/68 and pulse 96 and regular. BMI 27.14. The patient is somewhat anxious and apprehensive. I cannot appreciate stigmata of chronic liver disease. Her chest is entirely clear, and she is in a regular rhythm without murmurs gallops or rubs. There is no abdominal distention, organomegaly, masses or tenderness. Bowel sounds are entirely normal. Peripheral extremities unremarkable. Mental status is normal. Her husband was present throughout her interview and exam.    Assessment and  Plan: Very unusual and atypical GI symptoms. Certainly does not sound like she had acute bowel obstruction without any nausea and vomiting. She has multiple explanations for various types of pain that she has related to her kidney stones, her back problems, etc. I am concerned that we may have a problem with pain management issues. Her symptoms do not seem consistent with diverticulitis, but I have ordered CBC and laboratory parameters for review. I have empirically placed her on Carafate suspension PCA each bedtime, and have scheduled outpatient endoscopy and colonoscopy with propofol sedation. She recently tried Prilosec which again she relates worsening her problems. Will send a copy of this note to her primary care physician for review. This is Dr. Cam Hai.. Encounter Diagnoses  Name Primary?  . Diverticulosis of colon (without mention of hemorrhage) Yes  . Other general symptoms    . Iron deficiency anemia, unspecified

## 2011-12-12 NOTE — Telephone Encounter (Signed)
Advised pt to go ahead and do stool sample and her labs are fine done now

## 2011-12-13 LAB — CELIAC PANEL 10
Endomysial Screen: NEGATIVE
Gliadin IgG: 51.6 U/mL — ABNORMAL HIGH (ref <20)
IgA: 226 mg/dL (ref 69–380)

## 2011-12-13 LAB — C-REACTIVE PROTEIN: CRP: 1.84 mg/dL — ABNORMAL HIGH (ref <0.60)

## 2011-12-14 LAB — URINE CULTURE

## 2011-12-16 DIAGNOSIS — M5030 Other cervical disc degeneration, unspecified cervical region: Secondary | ICD-10-CM | POA: Diagnosis not present

## 2011-12-16 DIAGNOSIS — M999 Biomechanical lesion, unspecified: Secondary | ICD-10-CM | POA: Diagnosis not present

## 2011-12-16 DIAGNOSIS — M9981 Other biomechanical lesions of cervical region: Secondary | ICD-10-CM | POA: Diagnosis not present

## 2011-12-16 DIAGNOSIS — M5137 Other intervertebral disc degeneration, lumbosacral region: Secondary | ICD-10-CM | POA: Diagnosis not present

## 2011-12-18 ENCOUNTER — Encounter: Payer: Medicare Other | Admitting: Gastroenterology

## 2011-12-19 DIAGNOSIS — K589 Irritable bowel syndrome without diarrhea: Secondary | ICD-10-CM | POA: Diagnosis not present

## 2011-12-19 DIAGNOSIS — G479 Sleep disorder, unspecified: Secondary | ICD-10-CM | POA: Diagnosis not present

## 2011-12-19 DIAGNOSIS — J309 Allergic rhinitis, unspecified: Secondary | ICD-10-CM | POA: Diagnosis not present

## 2011-12-27 ENCOUNTER — Ambulatory Visit: Payer: Medicare Other | Admitting: Gastroenterology

## 2011-12-31 DIAGNOSIS — M9981 Other biomechanical lesions of cervical region: Secondary | ICD-10-CM | POA: Diagnosis not present

## 2011-12-31 DIAGNOSIS — M999 Biomechanical lesion, unspecified: Secondary | ICD-10-CM | POA: Diagnosis not present

## 2011-12-31 DIAGNOSIS — M5137 Other intervertebral disc degeneration, lumbosacral region: Secondary | ICD-10-CM | POA: Diagnosis not present

## 2011-12-31 DIAGNOSIS — M5030 Other cervical disc degeneration, unspecified cervical region: Secondary | ICD-10-CM | POA: Diagnosis not present

## 2012-01-01 DIAGNOSIS — M5137 Other intervertebral disc degeneration, lumbosacral region: Secondary | ICD-10-CM | POA: Diagnosis not present

## 2012-01-01 DIAGNOSIS — M9981 Other biomechanical lesions of cervical region: Secondary | ICD-10-CM | POA: Diagnosis not present

## 2012-01-01 DIAGNOSIS — M5030 Other cervical disc degeneration, unspecified cervical region: Secondary | ICD-10-CM | POA: Diagnosis not present

## 2012-01-01 DIAGNOSIS — M999 Biomechanical lesion, unspecified: Secondary | ICD-10-CM | POA: Diagnosis not present

## 2012-01-03 ENCOUNTER — Telehealth: Payer: Self-pay | Admitting: Gastroenterology

## 2012-01-03 NOTE — Telephone Encounter (Signed)
No... needs to reschedule.

## 2012-01-06 ENCOUNTER — Other Ambulatory Visit: Payer: Self-pay | Admitting: Family Medicine

## 2012-01-06 DIAGNOSIS — N644 Mastodynia: Secondary | ICD-10-CM

## 2012-01-06 DIAGNOSIS — R05 Cough: Secondary | ICD-10-CM | POA: Diagnosis not present

## 2012-01-07 ENCOUNTER — Encounter: Payer: Medicare Other | Admitting: Gastroenterology

## 2012-01-08 ENCOUNTER — Ambulatory Visit
Admission: RE | Admit: 2012-01-08 | Discharge: 2012-01-08 | Disposition: A | Discharge status: Home / Self Care | Payer: Medicare Other | Source: Ambulatory Visit | Attending: Family Medicine | Admitting: Family Medicine

## 2012-01-08 DIAGNOSIS — N644 Mastodynia: Secondary | ICD-10-CM

## 2012-01-08 DIAGNOSIS — N6489 Other specified disorders of breast: Secondary | ICD-10-CM | POA: Diagnosis not present

## 2012-01-10 ENCOUNTER — Telehealth: Payer: Self-pay | Admitting: Internal Medicine

## 2012-01-10 MED ORDER — PREDNISONE 10 MG PO TABS: ORAL_TABLET | ORAL | Status: DC

## 2012-01-10 NOTE — Telephone Encounter (Signed)
Spoke with pt. She states that she has been having issues with PND for the past 3 months, but this has been worse for the past 2 days. She also has stared to wheeze, have increased SOB, and prod cough with large amounts of yellow to white sputum. She denies having any fever,CP, other co's. Please advise recs thanks! No Known Allergies

## 2012-01-10 NOTE — Telephone Encounter (Signed)
Called, spoke with pt.  I informed her CDY recs she take prednisone 10 mg take 2 daily x 3 days, then 1 daily x 3 days.  She is aware rx sent to [facility].  Advised if symptoms do not improve or worse to call back or seek emergency care if needed.  She verbalized understanding of these instructions and voiced no further questions/concerns at this time.

## 2012-01-10 NOTE — Telephone Encounter (Signed)
Per CY-okay to give Prednisone 10 mg #9 take 2 daily x 3 days, then 1 daily x 3 days; no refills.

## 2012-01-15 DIAGNOSIS — L578 Other skin changes due to chronic exposure to nonionizing radiation: Secondary | ICD-10-CM | POA: Diagnosis not present

## 2012-01-15 DIAGNOSIS — L821 Other seborrheic keratosis: Secondary | ICD-10-CM | POA: Diagnosis not present

## 2012-01-15 DIAGNOSIS — Z85828 Personal history of other malignant neoplasm of skin: Secondary | ICD-10-CM | POA: Diagnosis not present

## 2012-01-15 DIAGNOSIS — D239 Other benign neoplasm of skin, unspecified: Secondary | ICD-10-CM | POA: Diagnosis not present

## 2012-02-05 DIAGNOSIS — J329 Chronic sinusitis, unspecified: Secondary | ICD-10-CM | POA: Diagnosis not present

## 2012-02-05 DIAGNOSIS — J309 Allergic rhinitis, unspecified: Secondary | ICD-10-CM | POA: Diagnosis not present

## 2012-02-05 DIAGNOSIS — G43009 Migraine without aura, not intractable, without status migrainosus: Secondary | ICD-10-CM | POA: Diagnosis not present

## 2012-02-06 DIAGNOSIS — M5137 Other intervertebral disc degeneration, lumbosacral region: Secondary | ICD-10-CM | POA: Diagnosis not present

## 2012-02-06 DIAGNOSIS — M999 Biomechanical lesion, unspecified: Secondary | ICD-10-CM | POA: Diagnosis not present

## 2012-02-06 DIAGNOSIS — M9981 Other biomechanical lesions of cervical region: Secondary | ICD-10-CM | POA: Diagnosis not present

## 2012-02-06 DIAGNOSIS — M5030 Other cervical disc degeneration, unspecified cervical region: Secondary | ICD-10-CM | POA: Diagnosis not present

## 2012-02-13 DIAGNOSIS — M5137 Other intervertebral disc degeneration, lumbosacral region: Secondary | ICD-10-CM | POA: Diagnosis not present

## 2012-02-13 DIAGNOSIS — M5030 Other cervical disc degeneration, unspecified cervical region: Secondary | ICD-10-CM | POA: Diagnosis not present

## 2012-02-13 DIAGNOSIS — M999 Biomechanical lesion, unspecified: Secondary | ICD-10-CM | POA: Diagnosis not present

## 2012-02-13 DIAGNOSIS — M9981 Other biomechanical lesions of cervical region: Secondary | ICD-10-CM | POA: Diagnosis not present

## 2012-02-14 DIAGNOSIS — R51 Headache: Secondary | ICD-10-CM | POA: Diagnosis not present

## 2012-02-14 DIAGNOSIS — M9981 Other biomechanical lesions of cervical region: Secondary | ICD-10-CM | POA: Diagnosis not present

## 2012-02-14 DIAGNOSIS — M5137 Other intervertebral disc degeneration, lumbosacral region: Secondary | ICD-10-CM | POA: Diagnosis not present

## 2012-02-14 DIAGNOSIS — M999 Biomechanical lesion, unspecified: Secondary | ICD-10-CM | POA: Diagnosis not present

## 2012-02-14 DIAGNOSIS — R1084 Generalized abdominal pain: Secondary | ICD-10-CM | POA: Diagnosis not present

## 2012-02-14 DIAGNOSIS — M5030 Other cervical disc degeneration, unspecified cervical region: Secondary | ICD-10-CM | POA: Diagnosis not present

## 2012-02-20 DIAGNOSIS — K5732 Diverticulitis of large intestine without perforation or abscess without bleeding: Secondary | ICD-10-CM | POA: Diagnosis not present

## 2012-02-20 DIAGNOSIS — R109 Unspecified abdominal pain: Secondary | ICD-10-CM | POA: Diagnosis not present

## 2012-02-26 DIAGNOSIS — M5030 Other cervical disc degeneration, unspecified cervical region: Secondary | ICD-10-CM | POA: Diagnosis not present

## 2012-02-26 DIAGNOSIS — M5137 Other intervertebral disc degeneration, lumbosacral region: Secondary | ICD-10-CM | POA: Diagnosis not present

## 2012-02-26 DIAGNOSIS — M9981 Other biomechanical lesions of cervical region: Secondary | ICD-10-CM | POA: Diagnosis not present

## 2012-02-26 DIAGNOSIS — M999 Biomechanical lesion, unspecified: Secondary | ICD-10-CM | POA: Diagnosis not present

## 2012-02-27 DIAGNOSIS — M5030 Other cervical disc degeneration, unspecified cervical region: Secondary | ICD-10-CM | POA: Diagnosis not present

## 2012-02-27 DIAGNOSIS — M9981 Other biomechanical lesions of cervical region: Secondary | ICD-10-CM | POA: Diagnosis not present

## 2012-02-27 DIAGNOSIS — M5137 Other intervertebral disc degeneration, lumbosacral region: Secondary | ICD-10-CM | POA: Diagnosis not present

## 2012-02-27 DIAGNOSIS — M999 Biomechanical lesion, unspecified: Secondary | ICD-10-CM | POA: Diagnosis not present

## 2012-03-11 DIAGNOSIS — H2510 Age-related nuclear cataract, unspecified eye: Secondary | ICD-10-CM | POA: Diagnosis not present

## 2012-03-17 DIAGNOSIS — M5030 Other cervical disc degeneration, unspecified cervical region: Secondary | ICD-10-CM | POA: Diagnosis not present

## 2012-03-17 DIAGNOSIS — M999 Biomechanical lesion, unspecified: Secondary | ICD-10-CM | POA: Diagnosis not present

## 2012-03-17 DIAGNOSIS — M9981 Other biomechanical lesions of cervical region: Secondary | ICD-10-CM | POA: Diagnosis not present

## 2012-03-17 DIAGNOSIS — M5137 Other intervertebral disc degeneration, lumbosacral region: Secondary | ICD-10-CM | POA: Diagnosis not present

## 2012-03-18 DIAGNOSIS — N201 Calculus of ureter: Secondary | ICD-10-CM | POA: Diagnosis not present

## 2012-03-18 DIAGNOSIS — N200 Calculus of kidney: Secondary | ICD-10-CM | POA: Diagnosis not present

## 2012-03-18 DIAGNOSIS — R82998 Other abnormal findings in urine: Secondary | ICD-10-CM | POA: Diagnosis not present

## 2012-03-18 DIAGNOSIS — R350 Frequency of micturition: Secondary | ICD-10-CM | POA: Diagnosis not present

## 2012-03-20 ENCOUNTER — Encounter (HOSPITAL_BASED_OUTPATIENT_CLINIC_OR_DEPARTMENT_OTHER): Payer: Self-pay | Admitting: *Deleted

## 2012-03-20 ENCOUNTER — Other Ambulatory Visit: Payer: Self-pay | Admitting: Urology

## 2012-03-23 DIAGNOSIS — L570 Actinic keratosis: Secondary | ICD-10-CM | POA: Diagnosis not present

## 2012-03-24 ENCOUNTER — Encounter (HOSPITAL_BASED_OUTPATIENT_CLINIC_OR_DEPARTMENT_OTHER): Payer: Self-pay | Admitting: *Deleted

## 2012-03-24 NOTE — Progress Notes (Signed)
NPO AFTER MN. ARRIVES AT 0615. NEEDS ISTAT AND EKG. MAY TAKE RX PAIN MED IF NEEDED W/ SIP OF WATER.

## 2012-03-30 DIAGNOSIS — K219 Gastro-esophageal reflux disease without esophagitis: Secondary | ICD-10-CM | POA: Diagnosis not present

## 2012-03-30 DIAGNOSIS — J309 Allergic rhinitis, unspecified: Secondary | ICD-10-CM | POA: Diagnosis not present

## 2012-03-30 DIAGNOSIS — J45909 Unspecified asthma, uncomplicated: Secondary | ICD-10-CM | POA: Diagnosis not present

## 2012-03-30 DIAGNOSIS — N201 Calculus of ureter: Secondary | ICD-10-CM | POA: Diagnosis not present

## 2012-03-30 NOTE — Anesthesia Preprocedure Evaluation (Addendum)
Anesthesia Evaluation  Patient identified by MRN, date of birth, ID band Patient awake    Reviewed: Allergy & Precautions, H&P , NPO status , Patient's Chart, lab work & pertinent test results  Airway Mallampati: II TM Distance: >3 FB Neck ROM: full    Dental No notable dental hx. (+) Teeth Intact and Dental Advisory Given,    Pulmonary neg pulmonary ROS, asthma ,  Mild cold induced breath sounds clear to auscultation  Pulmonary exam normal       Cardiovascular Exercise Tolerance: Good hypertension, negative cardio ROS  Rhythm:regular Rate:Normal     Neuro/Psych negative neurological ROS  negative psych ROS   GI/Hepatic negative GI ROS, Neg liver ROS,   Endo/Other  negative endocrine ROS  Renal/GU negative Renal ROS  negative genitourinary   Musculoskeletal   Abdominal   Peds  Hematology negative hematology ROS (+)   Anesthesia Other Findings   Reproductive/Obstetrics negative OB ROS                          Anesthesia Physical Anesthesia Plan  ASA: II  Anesthesia Plan: General   Post-op Pain Management:    Induction: Intravenous  Airway Management Planned: LMA  Additional Equipment:   Intra-op Plan:   Post-operative Plan:   Informed Consent: I have reviewed the patients History and Physical, chart, labs and discussed the procedure including the risks, benefits and alternatives for the proposed anesthesia with the patient or authorized representative who has indicated his/her understanding and acceptance.   Dental Advisory Given  Plan Discussed with: CRNA and Surgeon  Anesthesia Plan Comments:         Anesthesia Quick Evaluation

## 2012-03-30 NOTE — H&P (Signed)
Problems  1. Nephrolithiasis Bilateral 592.0 2. Pyuria 791.9 3. Urinary Frequency 788.41  History of Present Illness  Ashley [name] returns today in f/u for her stones.  She had some symptoms about 3 wks ago with lower abdominal pain for about 2 hours that was moderately severe.  This was associated with pressure and a sensation of incomplete emptying.  She has seen no blood in the urine.   She had ESWL for a right stone in 11/12.  Her UA today has pyuria and a KUB today shows stable bilateral renal stones but a probable RUVJ 5mm stone.  She has been on amoxicillin recently for a sinus infection.  She has had problems with diverticulitis and is going to have a colonoscopy in the near future.  She keeps Cipro and pain med on hand.   Past Medical History Problems  1. History of  Constipation 564.00 2. History of  Depression 311 3. History of  Diffuse Abdominal Pain 789.07 4. History of  Distal Ureteral Stone On The Right 592.1 5. History of  Lumbar Disc Degeneration 722.52 6. Nephrolithiasis Bilateral 592.0 7. History of  Proximal Ureteral Stone On The Right 592.1 8. History of  Spinal Stenosis 724.00 9. History of  Urinary Tract Infection V13.02  Surgical History Problems  1. History of  Cholecystectomy 2. History of  Cystoscopy With Ureteroscopy With Lithotripsy 3. History of  Cystoscopy With Ureteroscopy With Lithotripsy 4. History of  Cystoscopy With Ureteroscopy With Lithotripsy 5. History of  Cystoscopy With Ureteroscopy With Removal Of Calculus 6. History of  Cystoscopy With Ureteroscopy With Removal Of Calculus 7. History of  Cystoscopy With Ureteroscopy With Removal Of Calculus 8. History of  Foot Surgery 9. History of  Hysterectomy 10. History of  Inguinal Hernia Repair 11. History of  Kidney Surgery 12. History of  Lithotripsy 13. History of  Lithotripsy 14. History of  Lower Back Surgery 15. History of  Tonsillectomy 16. History of  Ureterolithotomy  Current Meds 1.  CeleBREX CAPS; Therapy: (Recorded:03Oct2011) to 2. Ciprofloxacin HCl 500 MG Oral Tablet; 1 po BID for 10 days; Therapy: 08Mar2013 to  (Evaluate:07Apr2013)  Requested for: 08Mar2013; Last Rx:08Mar2013 3. Desipramine HCl 25 MG Oral Tablet; Therapy: (Recorded:08Sep2008) to 4. Docusate Sodium 100 MG Oral Capsule; TAKE 1 CAPSULE TWICE DAILY AS NEEDED; Therapy:  08May2012 to (Evaluate:23May2012)  Requested for: 08May2012; Last Rx:08May2012 5. Estradiol TABS; Therapy: (Recorded:08Sep2008) to 6. Fish Oil CAPS; Therapy: (Recorded:08Sep2008) to 7. Hydrocodone-Acetaminophen 5-325 MG Oral Tablet; take  1-2 tab po q 4 hours prn pain; Therapy:  13Aug2010 to (Last Rx:21Feb2011) 8. Hydrocodone-Acetaminophen 5-325 MG Oral Tablet; TAKE 1 TO 2 TABLETS EVERY 4 TO 6  HOURS AS NEEDED; Therapy: 04Sep2012 to (Evaluate:24Nov2012); Last Rx:19Nov2012 9. Morphine Sulfate 15 MG Oral Tablet; TAKE 1 TO 2 TABLETS EVERY 4 HOURS AS NEEDED FOR  PAIN; Therapy: 04Sep2012 to (Evaluate:11Sep2012); Last Rx:04Sep2012 10. Multi-Vitamin Oral Tablet; with out iron; Therapy: (Recorded:08Sep2008) to 11. Tekturna TABS; Therapy: (Recorded:05Mar2009) to 12. Zolpidem Tartrate 10 MG Oral Tablet; Therapy: 06Aug2012 to  Allergies Medication  1. No Known Drug Allergies  Family History Problems  1. Maternal history of  Chronic Obstructive Pulmonary Disease 2. Maternal history of  Congestive Heart Failure  Social History Problems  1. Alcohol Use Social 2. Former Smoker Quit around [employer]. 3. Marital History - Currently Married 4. Occupation: IT consultant   Past and social history reviewed and updated.   Review of Systems  Genitourinary: urinary urgency.  Gastrointestinal: no nausea.  Constitutional: feeling  poorly (malaise), but no fever.    Vitals Vital Signs [Data Includes: Last 1 Day]  03Jul2013 11:27AM  Blood Pressure: 140 / 85 Temperature: 97.7 F Heart Rate: 89  Physical Exam Constitutional: Well nourished  and well developed . No acute distress.  Pulmonary: No respiratory distress and normal respiratory rhythm and effort.  Cardiovascular: Heart rate and rhythm are normal . No peripheral edema.    Results/Data Urine [Data Includes: Last 1 Day]   03Jul2013  COLOR YELLOW   APPEARANCE CLEAR   SPECIFIC GRAVITY 1.015   pH 5.5   GLUCOSE NEG mg/dL  BILIRUBIN NEG   KETONE NEG mg/dL  BLOOD NEG   PROTEIN NEG mg/dL  UROBILINOGEN 0.2 mg/dL  NITRITE NEG   LEUKOCYTE ESTERASE SMALL   SQUAMOUS EPITHELIAL/HPF RARE   WBC 21-50 WBC/hpf  RBC 02 RBC/hpf  BACTERIA FEW   CRYSTALS NONE SEEN   CASTS NONE SEEN    The following images/tracing/specimen were independently visualized:  KUB today shows stable large volume bilateral renal stones and there is a 5x89mm right UVJ stone. There are no other abnormalities noted.    Assessment Assessed  1. Nephrolithiasis Bilateral 592.0 2. Pyuria 791.9 3. Ureteral Stone Right 592.1 4. Urinary Frequency 788.41   She has a symptomatic RUVJ stone with bladder symptoms and pyuria. She has stable bilateral renal stones.   Plan  Health Maintenance (V70.0)  1. UA With REFLEX  Done: 03Jul2013 11:10AM Ureteral Stone (592.1)  2. Follow-up Schedule Surgery Office  Follow-up  Requested for: 03Jul2013 Urinary Frequency (788.41)  3. Uribel 118 MG Oral Capsule; take 1 capsule   4  times a day prn; Therapy: 03Jul2013 to (Last  Rx:03Jul2013)   I am going to culture her urine and give her Uribel pending the culture. I discussed medical therapy vs ureteroscopy.  I don't think this is a good candidate stone for ESWL. I will get her set up for ureteroscopy and have reviewed the risks.

## 2012-03-31 ENCOUNTER — Encounter (HOSPITAL_BASED_OUTPATIENT_CLINIC_OR_DEPARTMENT_OTHER)
Admission: RE | Disposition: A | Discharge status: Home / Self Care | Payer: Self-pay | Source: Ambulatory Visit | Attending: Urology

## 2012-03-31 ENCOUNTER — Ambulatory Visit (HOSPITAL_BASED_OUTPATIENT_CLINIC_OR_DEPARTMENT_OTHER)
Admission: RE | Admit: 2012-03-31 | Discharge: 2012-03-31 | Disposition: A | Discharge status: Home / Self Care | Payer: Medicare Other | Source: Ambulatory Visit | Attending: Urology | Admitting: Urology

## 2012-03-31 ENCOUNTER — Encounter (HOSPITAL_BASED_OUTPATIENT_CLINIC_OR_DEPARTMENT_OTHER): Payer: Self-pay | Admitting: *Deleted

## 2012-03-31 ENCOUNTER — Encounter (HOSPITAL_BASED_OUTPATIENT_CLINIC_OR_DEPARTMENT_OTHER): Payer: Self-pay | Admitting: Anesthesiology

## 2012-03-31 ENCOUNTER — Ambulatory Visit (HOSPITAL_BASED_OUTPATIENT_CLINIC_OR_DEPARTMENT_OTHER): Payer: Medicare Other | Admitting: Anesthesiology

## 2012-03-31 DIAGNOSIS — R82998 Other abnormal findings in urine: Secondary | ICD-10-CM | POA: Diagnosis not present

## 2012-03-31 DIAGNOSIS — Z79899 Other long term (current) drug therapy: Secondary | ICD-10-CM | POA: Insufficient documentation

## 2012-03-31 DIAGNOSIS — R350 Frequency of micturition: Secondary | ICD-10-CM | POA: Insufficient documentation

## 2012-03-31 DIAGNOSIS — N200 Calculus of kidney: Secondary | ICD-10-CM | POA: Diagnosis not present

## 2012-03-31 DIAGNOSIS — N201 Calculus of ureter: Secondary | ICD-10-CM | POA: Diagnosis not present

## 2012-03-31 HISTORY — DX: Spondylosis, unspecified: M47.9

## 2012-03-31 HISTORY — DX: Calculus of ureter: N20.1

## 2012-03-31 HISTORY — PX: CYSTOSCOPY/RETROGRADE/URETEROSCOPY/STONE EXTRACTION WITH BASKET: SHX5317

## 2012-03-31 HISTORY — DX: Diverticulosis of large intestine without perforation or abscess without bleeding: K57.30

## 2012-03-31 HISTORY — DX: Reserved for concepts with insufficient information to code with codable children: IMO0002

## 2012-03-31 HISTORY — DX: Unspecified asthma, uncomplicated: J45.909

## 2012-03-31 HISTORY — DX: Personal history of urinary calculi: Z87.442

## 2012-03-31 HISTORY — DX: Frequency of micturition: R35.0

## 2012-03-31 LAB — POCT I-STAT 4, (NA,K, GLUC, HGB,HCT)
HCT: 45 % (ref 36.0–46.0)
Hemoglobin: 15.3 g/dL — ABNORMAL HIGH (ref 12.0–15.0)
Sodium: 141 mEq/L (ref 135–145)

## 2012-03-31 SURGERY — CYSTOSCOPY, WITH CALCULUS REMOVAL USING BASKET
Anesthesia: General | Site: Ureter | Laterality: Right | Wound class: Clean Contaminated

## 2012-03-31 MED ORDER — IOHEXOL 350 MG/ML SOLN
INTRAVENOUS | Status: DC | PRN
  Administered 2012-03-31: 2 mL

## 2012-03-31 MED ORDER — LIDOCAINE HCL (CARDIAC) 20 MG/ML IV SOLN
INTRAVENOUS | Status: DC | PRN
  Administered 2012-03-31: 60 mg via INTRAVENOUS

## 2012-03-31 MED ORDER — ONDANSETRON HCL 4 MG/2ML IJ SOLN
INTRAMUSCULAR | Status: DC | PRN
  Administered 2012-03-31: 4 mg via INTRAVENOUS

## 2012-03-31 MED ORDER — PROMETHAZINE HCL 25 MG/ML IJ SOLN
6.2500 mg | Freq: Once | INTRAMUSCULAR | Status: AC
  Administered 2012-03-31: 6.25 mg via INTRAVENOUS

## 2012-03-31 MED ORDER — OXYCODONE HCL 5 MG PO TABS
5.0000 mg | ORAL_TABLET | ORAL | Status: DC | PRN
  Administered 2012-03-31: 5 mg via ORAL

## 2012-03-31 MED ORDER — SODIUM CHLORIDE 0.9 % IJ SOLN: 3.0000 mL | INTRAMUSCULAR | Status: DC | PRN

## 2012-03-31 MED ORDER — SODIUM CHLORIDE 0.9 % IJ SOLN: 3.0000 mL | Freq: Two times a day (BID) | INTRAMUSCULAR | Status: DC

## 2012-03-31 MED ORDER — FENTANYL CITRATE 0.05 MG/ML IJ SOLN
25.0000 ug | INTRAMUSCULAR | Status: DC | PRN
  Administered 2012-03-31 (×3): 25 ug via INTRAVENOUS

## 2012-03-31 MED ORDER — BELLADONNA ALKALOIDS-OPIUM 16.2-60 MG RE SUPP
RECTAL | Status: DC | PRN
  Administered 2012-03-31: 1 via RECTAL

## 2012-03-31 MED ORDER — ONDANSETRON HCL 4 MG/2ML IJ SOLN: 4.0000 mg | Freq: Four times a day (QID) | INTRAMUSCULAR | Status: DC | PRN

## 2012-03-31 MED ORDER — SODIUM CHLORIDE 0.9 % IR SOLN
Status: DC | PRN
  Administered 2012-03-31: 6000 mL

## 2012-03-31 MED ORDER — DEXAMETHASONE SODIUM PHOSPHATE 4 MG/ML IJ SOLN
INTRAMUSCULAR | Status: DC | PRN
  Administered 2012-03-31 (×2): 5 mg via INTRAVENOUS

## 2012-03-31 MED ORDER — CIPROFLOXACIN IN D5W 400 MG/200ML IV SOLN
400.0000 mg | INTRAVENOUS | Status: AC
  Administered 2012-03-31: 400 mg via INTRAVENOUS

## 2012-03-31 MED ORDER — LACTATED RINGERS IV SOLN: INTRAVENOUS | Status: DC

## 2012-03-31 MED ORDER — MIDAZOLAM HCL 5 MG/5ML IJ SOLN
INTRAMUSCULAR | Status: DC | PRN
  Administered 2012-03-31: 1 mg via INTRAVENOUS

## 2012-03-31 MED ORDER — SODIUM CHLORIDE 0.9 % IV SOLN: 250.0000 mL | INTRAVENOUS | Status: DC | PRN

## 2012-03-31 MED ORDER — PROPOFOL 10 MG/ML IV EMUL
INTRAVENOUS | Status: DC | PRN
  Administered 2012-03-31: 150 mg via INTRAVENOUS

## 2012-03-31 MED ORDER — KETOROLAC TROMETHAMINE 30 MG/ML IJ SOLN
INTRAMUSCULAR | Status: DC | PRN
  Administered 2012-03-31: 15 mg via INTRAVENOUS

## 2012-03-31 MED ORDER — FENTANYL CITRATE 0.05 MG/ML IJ SOLN
INTRAMUSCULAR | Status: DC | PRN
  Administered 2012-03-31: 50 ug via INTRAVENOUS

## 2012-03-31 MED ORDER — FENTANYL CITRATE 0.05 MG/ML IJ SOLN: 25.0000 ug | INTRAMUSCULAR | Status: DC | PRN

## 2012-03-31 MED ORDER — LACTATED RINGERS IV SOLN
INTRAVENOUS | Status: DC
  Administered 2012-03-31: 07:00:00 via INTRAVENOUS
  Administered 2012-03-31: 100 mL/h via INTRAVENOUS

## 2012-03-31 MED ORDER — METOCLOPRAMIDE HCL 5 MG/ML IJ SOLN
INTRAMUSCULAR | Status: DC | PRN
  Administered 2012-03-31: 10 mg via INTRAVENOUS

## 2012-03-31 MED ORDER — ACETAMINOPHEN 650 MG RE SUPP: 650.0000 mg | RECTAL | Status: DC | PRN

## 2012-03-31 MED ORDER — ACETAMINOPHEN 325 MG PO TABS: 650.0000 mg | ORAL_TABLET | ORAL | Status: DC | PRN

## 2012-03-31 SURGICAL SUPPLY — 30 items
BAG DRAIN URO-CYSTO SKYTR STRL (DRAIN) ×3 IMPLANT
BASKET LASER NITINOL 1.9FR (BASKET) IMPLANT
BASKET ZERO TIP NITINOL 2.4FR (BASKET) ×3 IMPLANT
BRUSH URET BIOPSY 3F (UROLOGICAL SUPPLIES) IMPLANT
CANISTER SUCT LVC 12 LTR MEDI- (MISCELLANEOUS) ×3 IMPLANT
CATH URET 5FR 28IN CONE TIP (BALLOONS)
CATH URET 5FR 28IN OPEN ENDED (CATHETERS) ×3 IMPLANT
CATH URET 5FR 70CM CONE TIP (BALLOONS) IMPLANT
CLOTH BEACON ORANGE TIMEOUT ST (SAFETY) ×3 IMPLANT
DRAPE CAMERA CLOSED 9X96 (DRAPES) ×3 IMPLANT
ELECT REM PT RETURN 9FT ADLT (ELECTROSURGICAL)
ELECTRODE REM PT RTRN 9FT ADLT (ELECTROSURGICAL) IMPLANT
GLOVE INDICATOR 6.5 STRL GRN (GLOVE) ×3 IMPLANT
GLOVE SURG SS PI 8.0 STRL IVOR (GLOVE) ×3 IMPLANT
GOWN PREVENTION PLUS LG XLONG (DISPOSABLE) IMPLANT
GOWN STRL REIN XL XLG (GOWN DISPOSABLE) ×3 IMPLANT
GOWN SURGICAL LARGE (GOWNS) ×3 IMPLANT
GUIDEWIRE 0.038 PTFE COATED (WIRE) IMPLANT
GUIDEWIRE ANG ZIPWIRE 038X150 (WIRE) IMPLANT
GUIDEWIRE STR DUAL SENSOR (WIRE) ×3 IMPLANT
IV NS IRRIG 3000ML ARTHROMATIC (IV SOLUTION) ×6 IMPLANT
KIT BALLIN UROMAX 15FX10 (LABEL) IMPLANT
KIT BALLN UROMAX 15FX4 (MISCELLANEOUS) IMPLANT
KIT BALLN UROMAX 26 75X4 (MISCELLANEOUS)
LASER FIBER DISP (UROLOGICAL SUPPLIES) IMPLANT
LASER FIBER DISP 1000U (UROLOGICAL SUPPLIES) IMPLANT
PACK CYSTOSCOPY (CUSTOM PROCEDURE TRAY) ×3 IMPLANT
SET HIGH PRES BAL DIL (LABEL)
SHEATH URET ACCESS 12FR/35CM (UROLOGICAL SUPPLIES) IMPLANT
SHEATH URET ACCESS 12FR/55CM (UROLOGICAL SUPPLIES) IMPLANT

## 2012-03-31 NOTE — Anesthesia Procedure Notes (Signed)
Procedure Name: LMA Insertion Date/Time: 03/31/2012 7:44 AM Performed by: Norva Pavlov Pre-anesthesia Checklist: Patient identified, Emergency Drugs available, Suction available and Patient being monitored Patient Re-evaluated:Patient Re-evaluated prior to inductionOxygen Delivery Method: Circle System Utilized Preoxygenation: Pre-oxygenation with 100% oxygen Intubation Type: IV induction Ventilation: Mask ventilation without difficulty LMA: LMA with gastric port inserted LMA Size: 4.0 Number of attempts: 1 Placement Confirmation: positive ETCO2 Tube secured with: Tape Dental Injury: Teeth and Oropharynx as per pre-operative assessment

## 2012-03-31 NOTE — Anesthesia Postprocedure Evaluation (Signed)
  Anesthesia Post-op Note  Patient: Ashley [name]  Procedure(s) Performed: Procedure(s) (LRB): CYSTOSCOPY/RETROGRADE/URETEROSCOPY/STONE EXTRACTION WITH BASKET (Right)  Patient Location: PACU  Anesthesia Type: General  Level of Consciousness: awake and alert   Airway and Oxygen Therapy: Patient Spontanous Breathing  Post-op Pain: mild  Post-op Assessment: Post-op Vital signs reviewed, Patient's Cardiovascular Status Stable, Respiratory Function Stable, Patent Airway and No signs of Nausea or vomiting  Post-op Vital Signs: stable  Complications: No apparent anesthesia complications

## 2012-03-31 NOTE — Transfer of Care (Signed)
Immediate Anesthesia Transfer of Care Note  Patient: Ashley [name]  Procedure(s) Performed: Procedure(s) (LRB): CYSTOSCOPY/RETROGRADE/URETEROSCOPY/STONE EXTRACTION WITH BASKET (Right)  Patient Location: PACU  Anesthesia Type: General  Level of Consciousness: awake, alert  and oriented  Airway & Oxygen Therapy: Patient Spontanous Breathing and Patient connected to face mask oxygen  Post-op Assessment: Report given to PACU RN and Post -op Vital signs reviewed and stable  Post vital signs: Reviewed and stable  Complications: No apparent anesthesia complications

## 2012-03-31 NOTE — Interval H&P Note (Signed)
History and Physical Interval Note:  03/31/2012 7:01 AM  Ashley [name]  has presented today for surgery, with the diagnosis of right urethral stone  The various methods of treatment have been discussed with the patient and family. After consideration of risks, benefits and other options for treatment, the patient has consented to  Procedure(s) (LRB): CYSTOSCOPY/RETROGRADE/URETEROSCOPY/STONE EXTRACTION WITH BASKET (Right) HOLMIUM LASER APPLICATION (Right) as a surgical intervention .  The patient's history has been reviewed, patient examined, no change in status, stable for surgery.  I have reviewed the patients' chart and labs.  Questions were answered to the patient's satisfaction.     Lovina Zuver J

## 2012-03-31 NOTE — Brief Op Note (Signed)
03/31/2012  8:04 AM  PATIENT:  Carole Binning Creasy  71 y.o. female  PRE-OPERATIVE DIAGNOSIS:  right urethral stone  POST-OPERATIVE DIAGNOSIS:  right urethral stone  PROCEDURE:  Procedure(s) (LRB): CYSTOSCOPY/RETROGRADE/URETEROSCOPY/STONE EXTRACTION WITH BASKET (Right)  SURGEON:  Surgeon(s) and Role:    * Anner Crete, MD - Primary  PHYSICIAN ASSISTANT:   ASSISTANTS: none   ANESTHESIA:   general  EBL:  Total I/O In: 200 [I.V.:200] Out: -   BLOOD ADMINISTERED:none  DRAINS: none   LOCAL MEDICATIONS USED:  NONE  SPECIMEN:  Source of Specimen:  stone right distal ureter  DISPOSITION OF SPECIMEN:  to patient  COUNTS:  YES  TOURNIQUET:  * No tourniquets in log *  DICTATION: .Other Dictation: Dictation Number [id number]  PLAN OF CARE: Discharge to home after PACU  PATIENT DISPOSITION:  PACU - hemodynamically stable.   Delay start of Pharmacological VTE agent (>24hrs) due to surgical blood loss or risk of bleeding: no

## 2012-04-01 ENCOUNTER — Encounter (HOSPITAL_BASED_OUTPATIENT_CLINIC_OR_DEPARTMENT_OTHER): Payer: Self-pay | Admitting: Urology

## 2012-04-01 NOTE — Op Note (Signed)
NAMEKHRYSTINA, BONNES NO.:  [id number]  MEDICAL RECORD NO.:  [id number]  LOCATION:                               FACILITY:  [facility]  PHYSICIAN:  Excell Seltzer. Annabell Howells, M.D.    DATE OF BIRTH:  [birth year]  DATE OF PROCEDURE:  03/31/2012 DATE OF DISCHARGE:                              OPERATIVE REPORT   PREOPERATIVE DIAGNOSIS:  Right distal ureteral stone.  POSTOPERATIVE DIAGNOSIS:  Right distal ureteral stone.  PROCEDURES:  Cystoscopy, right retrograde pyelogram with interpretation of right ureteroscopic stone extraction.  SURGEON:  Excell Seltzer. Annabell Howells, M.D.  ANESTHESIA:  General.  SPECIMEN:  Stone.  DRAINS:  None.  COMPLICATIONS:  None.  INDICATIONS:  This is a 71 year old white female with a history of stones who presented recently with urgency, frequency, and some right- sided pain.  Imaging demonstrated approximately a 3 x 5 mm stone in the area of the right distal ureter.  She elected ureteroscopy for treatment findings.  PROCEDURE IN DETAIL:  She was given Cipro and was taken to operating room, where general anesthetic was induced.  She was placed in lithotomy position and fitted with PAS hose.  B and O suppository was placed.  She was prepped with Betadine solution and draped in usual sterile fashion.  Cystoscopy was performed with a 22-French scope and 12-degree lens. Examination revealed a normal urethra.  The bladder wall is smooth and pale without tumor, stones, or inflammation.  The left ureteral orifice was unremarkable.  The right ureteral orifice had mild erythema.  Right ureteral orifice was cannulated with a 5-French open-end catheter and contrast was instilled.  This revealed a very subtle filling defect in the distal ureter with no significant hydronephrosis.  A guidewire was then advanced to the kidney and a 6.5-French rigid ureteroscope was passed alongside the wire.  There was some edema of the distal ureter.  The stone was seen more  proximally.  It was grasped with a Nitinol basket and removed.  Largely intact a couple small fragments broke off and these were later retrieved with the basket.  After stone retrieval, inspection of the ureter revealed some mild inflammatory changes of the distal ureter consistent with the recent stone present, but it was not felt that stenting was indicated.  The bladder was drained.  The guidewire was removed.  The patient was taken down from the lithotomy position.  Her anesthetic was reversed.  She was moved to recovery in stable condition.  There were no complications.     Excell Seltzer. Annabell Howells, M.D.     JJW/MEDQ  D:  03/31/2012  T:  04/01/2012  Job:  [id number]

## 2012-04-02 DIAGNOSIS — H01029 Squamous blepharitis unspecified eye, unspecified eyelid: Secondary | ICD-10-CM | POA: Diagnosis not present

## 2012-04-06 ENCOUNTER — Encounter (HOSPITAL_BASED_OUTPATIENT_CLINIC_OR_DEPARTMENT_OTHER): Payer: Self-pay

## 2012-04-07 DIAGNOSIS — R82998 Other abnormal findings in urine: Secondary | ICD-10-CM | POA: Diagnosis not present

## 2012-04-07 DIAGNOSIS — N200 Calculus of kidney: Secondary | ICD-10-CM | POA: Diagnosis not present

## 2012-04-14 DIAGNOSIS — M9981 Other biomechanical lesions of cervical region: Secondary | ICD-10-CM | POA: Diagnosis not present

## 2012-04-14 DIAGNOSIS — M5030 Other cervical disc degeneration, unspecified cervical region: Secondary | ICD-10-CM | POA: Diagnosis not present

## 2012-04-14 DIAGNOSIS — M999 Biomechanical lesion, unspecified: Secondary | ICD-10-CM | POA: Diagnosis not present

## 2012-04-14 DIAGNOSIS — M5137 Other intervertebral disc degeneration, lumbosacral region: Secondary | ICD-10-CM | POA: Diagnosis not present

## 2012-04-20 DIAGNOSIS — L821 Other seborrheic keratosis: Secondary | ICD-10-CM | POA: Diagnosis not present

## 2012-04-24 ENCOUNTER — Other Ambulatory Visit: Payer: Self-pay | Admitting: Gastroenterology

## 2012-04-24 DIAGNOSIS — R109 Unspecified abdominal pain: Secondary | ICD-10-CM | POA: Diagnosis not present

## 2012-04-24 DIAGNOSIS — K5730 Diverticulosis of large intestine without perforation or abscess without bleeding: Secondary | ICD-10-CM | POA: Diagnosis not present

## 2012-04-24 DIAGNOSIS — D126 Benign neoplasm of colon, unspecified: Secondary | ICD-10-CM | POA: Diagnosis not present

## 2012-04-27 DIAGNOSIS — M5030 Other cervical disc degeneration, unspecified cervical region: Secondary | ICD-10-CM | POA: Diagnosis not present

## 2012-04-27 DIAGNOSIS — M999 Biomechanical lesion, unspecified: Secondary | ICD-10-CM | POA: Diagnosis not present

## 2012-04-27 DIAGNOSIS — M5137 Other intervertebral disc degeneration, lumbosacral region: Secondary | ICD-10-CM | POA: Diagnosis not present

## 2012-04-27 DIAGNOSIS — M9981 Other biomechanical lesions of cervical region: Secondary | ICD-10-CM | POA: Diagnosis not present

## 2012-05-07 DIAGNOSIS — N200 Calculus of kidney: Secondary | ICD-10-CM | POA: Diagnosis not present

## 2012-05-07 DIAGNOSIS — M47812 Spondylosis without myelopathy or radiculopathy, cervical region: Secondary | ICD-10-CM | POA: Diagnosis not present

## 2012-05-07 DIAGNOSIS — E7800 Pure hypercholesterolemia, unspecified: Secondary | ICD-10-CM | POA: Diagnosis not present

## 2012-05-07 DIAGNOSIS — G479 Sleep disorder, unspecified: Secondary | ICD-10-CM | POA: Diagnosis not present

## 2012-05-07 DIAGNOSIS — Z0000 Encounter for general adult medical examination without abnormal findings: Secondary | ICD-10-CM | POA: Diagnosis not present

## 2012-05-07 DIAGNOSIS — M4310 Spondylolisthesis, site unspecified: Secondary | ICD-10-CM | POA: Diagnosis not present

## 2012-05-07 DIAGNOSIS — N951 Menopausal and female climacteric states: Secondary | ICD-10-CM | POA: Diagnosis not present

## 2012-05-07 DIAGNOSIS — E559 Vitamin D deficiency, unspecified: Secondary | ICD-10-CM | POA: Diagnosis not present

## 2012-05-07 DIAGNOSIS — E213 Hyperparathyroidism, unspecified: Secondary | ICD-10-CM | POA: Diagnosis not present

## 2012-05-07 DIAGNOSIS — I10 Essential (primary) hypertension: Secondary | ICD-10-CM | POA: Diagnosis not present

## 2012-05-11 DIAGNOSIS — M9981 Other biomechanical lesions of cervical region: Secondary | ICD-10-CM | POA: Diagnosis not present

## 2012-05-11 DIAGNOSIS — M999 Biomechanical lesion, unspecified: Secondary | ICD-10-CM | POA: Diagnosis not present

## 2012-05-11 DIAGNOSIS — M5137 Other intervertebral disc degeneration, lumbosacral region: Secondary | ICD-10-CM | POA: Diagnosis not present

## 2012-05-11 DIAGNOSIS — M5030 Other cervical disc degeneration, unspecified cervical region: Secondary | ICD-10-CM | POA: Diagnosis not present

## 2012-05-14 ENCOUNTER — Encounter: Payer: Self-pay | Admitting: Internal Medicine

## 2012-05-14 ENCOUNTER — Other Ambulatory Visit: Payer: Medicare Other

## 2012-05-14 ENCOUNTER — Ambulatory Visit (INDEPENDENT_AMBULATORY_CARE_PROVIDER_SITE_OTHER): Payer: Medicare Other | Admitting: Internal Medicine

## 2012-05-14 ENCOUNTER — Ambulatory Visit (INDEPENDENT_AMBULATORY_CARE_PROVIDER_SITE_OTHER)
Admission: RE | Admit: 2012-05-14 | Discharge: 2012-05-14 | Disposition: A | Discharge status: Home / Self Care | Payer: Medicare Other | Source: Ambulatory Visit | Attending: Internal Medicine | Admitting: Internal Medicine

## 2012-05-14 VITALS — BP 128/80 | HR 93 | Ht 63.0 in | Wt 152.2 lb

## 2012-05-14 DIAGNOSIS — R071 Chest pain on breathing: Secondary | ICD-10-CM

## 2012-05-14 DIAGNOSIS — J209 Acute bronchitis, unspecified: Secondary | ICD-10-CM | POA: Diagnosis not present

## 2012-05-14 DIAGNOSIS — R0789 Other chest pain: Secondary | ICD-10-CM

## 2012-05-14 DIAGNOSIS — J45909 Unspecified asthma, uncomplicated: Secondary | ICD-10-CM

## 2012-05-14 DIAGNOSIS — J018 Other acute sinusitis: Secondary | ICD-10-CM

## 2012-05-14 NOTE — Progress Notes (Signed)
Patient ID: Ashley [name], female    DOB: Apr 27, 1941, 71 y.o.   MRN: [id number]  HPI  71 yo F former smoker followed for allergic rhinitis with hx asthmatic bronchitis and acute rhinosinusitis.  We called in a Z pak for a cold with sinus complaints- she cleared completely for a month then over last 6 weeks she has had persistent nasal congestion, rhinorhea and cough with light yellow phlegm. Valtrex helped for canker sores with her cold. She is taking an anticongestant, otc . Main complaints are watery eyes, cough and sneeze.  05/15/11-  13 yo F former smoker followed for allergic rhinitis with hx asthmatic bronchitis and acute rhinosinusitis. We had sent Z pak in February.  She is coughing again. 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 Head congestion, cough, some drainage, no fever, no sore throat, minimal wheeze. GI ok.  Pending parathyroid surgery for hx of multiple kidney stones. Not taking prednisone.  06/24/11-   45 yo F former smoker followed for allergic rhinitis with hx asthmatic bronchitis and acute rhinosinusitis. Husband here today. 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 Several on that trip shared a cold. She had not cleared completely before leaving and used Zithromax at the beginning of that trip. She has been back a week, and now in the past 5 days has hacking cough, low-grade fever, clear to yellow sputum. Blowing her nose and coughing productively especially in the last 2 days. Some wheeze. Sore throat and yesterday. She has now taken 4 days of Cipro. Chest is not as tight as 2 days ago.  05/14/12- 31 yo F former smoker followed for allergic rhinitis with hx asthmatic bronchitis and acute rhinosinusitis. Unsure if she has true allergies-no complaints at this time Bilateral rib pains. Had GI workup for GERD-can't tell Prilosec helps the rib pain. 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 Rhinitis symptoms are worst in the spring and currently minor. No recent significant cough or wheeze. She is always thinking ahead to her next travel and necessary preparation including antibiotic to carry.  Review of Systems-See HPI Constitutional:   No weight loss, night sweats, fevers, chills, fatigue, lassitude. HEENT:   No headaches,  Difficulty swallowing,  Tooth/dental problems,  +Sore throats,  CV:  + chest pain,  no-Orthopnea, PND, swelling in lower extremities, anasarca, dizziness, palpitations GI  No heartburn, indigestion, abdominal pain, nausea, vomiting,  Resp: No shortness of breath with exertion or at rest.  No excess mucus, no- productive cough,  No non-productive cough,  No coughing up of blood.  No- change in color of mucus.  No- wheezing.  Skin: no rash or lesions. GU:  MS:  No joint pain or swelling.   Psych:  No change in mood or affect. No depression or anxiety.  No memory loss.     Objective:   Physical Exam General- Alert, Oriented, Affect-appropriate, Distress- none acute Skin- rash-none, lesions- none, excoriation- none Lymphadenopathy- none Head- atraumatic            Eyes- Gross vision intact, PERRLA, conjunctivae clear secretions            Ears- Hearing, canals-normal            Nose- Clear, no-Septal dev, mucus, polyps, erosion, perforation             Throat- Mallampati  II , mucosa clear , drainage- none, tonsils- atrophic Neck- flexible , trachea midline, no stridor , thyroid nl, carotid no bruit Chest - symmetrical excursion , unlabored           Heart/CV- RRR , no murmur , no gallop  , no rub, nl s1 s2                           - JVD- none , edema- none, stasis changes- none, varices- none           Lung- clear, unlabored, slightly coarse bilateral breath sounds without  rhonchi ,  dullness-none, rub- none           Chest wall-  Abd-  Br/ Gen/ Rectal- Not done, not indicated Extrem- cyanosis-  none, clubbing, none, atrophy- none, strength- nl Neuro- grossly intact to observation

## 2012-05-14 NOTE — Patient Instructions (Addendum)
Order- CXR   Dx  Asthma with bronchitis              Lab- Allergy Profile   Dx asthma with bronchitis    Suggest you try otc pepcid/ famotidine  20 mg, once daily before a meal  This is another acid blocker with different chemistry from  Prilosec/ omeprazole

## 2012-05-19 NOTE — Progress Notes (Signed)
Quick Note:  LMTCB ______ 

## 2012-05-20 ENCOUNTER — Telehealth: Payer: Self-pay | Admitting: Internal Medicine

## 2012-05-20 LAB — ALLERGY FULL PROFILE
Alternaria Alternata: <0.1 kU/L
Aspergillus fumigatus, IgG: <0.1 kU/L
Bahia Grass: <0.1 kU/L
Bermuda Grass: <0.1 kU/L
Cat Dander: <0.1 kU/L
D. farinae: <0.1 kU/L
Elm IgE: <0.1 kU/L
G009 Red Top: <0.1 kU/L
Lamb's Quarters: <0.1 kU/L
Plantain: <0.1 kU/L
Sycamore Tree: <0.1 kU/L

## 2012-05-20 NOTE — Telephone Encounter (Signed)
Normal chest xray- no problem seen   Pt aware of results.

## 2012-05-20 NOTE — Progress Notes (Signed)
Quick Note:  Pt aware of results. ______ 

## 2012-05-21 NOTE — Progress Notes (Signed)
Quick Note:  Pt aware of results-allergy testing scheduled for 06-17-12 at 3pm. Pt is aware of testing protocol-verbally went over with patient via phone call. ______

## 2012-05-24 DIAGNOSIS — R0789 Other chest pain: Secondary | ICD-10-CM | POA: Insufficient documentation

## 2012-05-24 NOTE — Assessment & Plan Note (Signed)
Currently controlled. Plan-chest x-ray 

## 2012-05-24 NOTE — Assessment & Plan Note (Signed)
This has been fairly long-standing, not progressive and not very specific. 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  Plan-chest x-ray

## 2012-05-24 NOTE — Assessment & Plan Note (Addendum)
Currently control. She questions an allergic rhinitis, pattern is worst in the spring. Plan-allergy profile

## 2012-05-27 DIAGNOSIS — M899 Disorder of bone, unspecified: Secondary | ICD-10-CM | POA: Diagnosis not present

## 2012-05-27 DIAGNOSIS — M949 Disorder of cartilage, unspecified: Secondary | ICD-10-CM | POA: Diagnosis not present

## 2012-05-28 DIAGNOSIS — M26629 Arthralgia of temporomandibular joint, unspecified side: Secondary | ICD-10-CM | POA: Diagnosis not present

## 2012-05-28 DIAGNOSIS — R51 Headache: Secondary | ICD-10-CM | POA: Diagnosis not present

## 2012-05-29 ENCOUNTER — Other Ambulatory Visit: Payer: Self-pay | Admitting: Dermatology

## 2012-05-29 DIAGNOSIS — L570 Actinic keratosis: Secondary | ICD-10-CM | POA: Diagnosis not present

## 2012-05-29 DIAGNOSIS — L578 Other skin changes due to chronic exposure to nonionizing radiation: Secondary | ICD-10-CM | POA: Diagnosis not present

## 2012-05-29 DIAGNOSIS — C44519 Basal cell carcinoma of skin of other part of trunk: Secondary | ICD-10-CM | POA: Diagnosis not present

## 2012-05-29 DIAGNOSIS — L819 Disorder of pigmentation, unspecified: Secondary | ICD-10-CM | POA: Diagnosis not present

## 2012-05-29 DIAGNOSIS — M999 Biomechanical lesion, unspecified: Secondary | ICD-10-CM | POA: Diagnosis not present

## 2012-05-29 DIAGNOSIS — C44611 Basal cell carcinoma of skin of unspecified upper limb, including shoulder: Secondary | ICD-10-CM | POA: Diagnosis not present

## 2012-05-29 DIAGNOSIS — L821 Other seborrheic keratosis: Secondary | ICD-10-CM | POA: Diagnosis not present

## 2012-05-29 DIAGNOSIS — D485 Neoplasm of uncertain behavior of skin: Secondary | ICD-10-CM | POA: Diagnosis not present

## 2012-05-29 DIAGNOSIS — M9981 Other biomechanical lesions of cervical region: Secondary | ICD-10-CM | POA: Diagnosis not present

## 2012-05-29 DIAGNOSIS — M5030 Other cervical disc degeneration, unspecified cervical region: Secondary | ICD-10-CM | POA: Diagnosis not present

## 2012-05-29 DIAGNOSIS — L739 Follicular disorder, unspecified: Secondary | ICD-10-CM | POA: Diagnosis not present

## 2012-05-29 DIAGNOSIS — M5137 Other intervertebral disc degeneration, lumbosacral region: Secondary | ICD-10-CM | POA: Diagnosis not present

## 2012-05-29 DIAGNOSIS — L905 Scar conditions and fibrosis of skin: Secondary | ICD-10-CM | POA: Diagnosis not present

## 2012-06-11 DIAGNOSIS — M5030 Other cervical disc degeneration, unspecified cervical region: Secondary | ICD-10-CM | POA: Diagnosis not present

## 2012-06-11 DIAGNOSIS — M999 Biomechanical lesion, unspecified: Secondary | ICD-10-CM | POA: Diagnosis not present

## 2012-06-11 DIAGNOSIS — M5137 Other intervertebral disc degeneration, lumbosacral region: Secondary | ICD-10-CM | POA: Diagnosis not present

## 2012-06-11 DIAGNOSIS — M9981 Other biomechanical lesions of cervical region: Secondary | ICD-10-CM | POA: Diagnosis not present

## 2012-06-17 ENCOUNTER — Ambulatory Visit (INDEPENDENT_AMBULATORY_CARE_PROVIDER_SITE_OTHER): Payer: Medicare Other | Admitting: Internal Medicine

## 2012-06-17 ENCOUNTER — Encounter: Payer: Self-pay | Admitting: Internal Medicine

## 2012-06-17 VITALS — BP 120/76 | HR 92 | Ht 63.0 in | Wt 150.6 lb

## 2012-06-17 DIAGNOSIS — J018 Other acute sinusitis: Secondary | ICD-10-CM | POA: Diagnosis not present

## 2012-06-17 DIAGNOSIS — J309 Allergic rhinitis, unspecified: Secondary | ICD-10-CM

## 2012-06-17 DIAGNOSIS — J209 Acute bronchitis, unspecified: Secondary | ICD-10-CM | POA: Diagnosis not present

## 2012-06-17 DIAGNOSIS — J3089 Other allergic rhinitis: Secondary | ICD-10-CM

## 2012-06-17 NOTE — Patient Instructions (Addendum)
Consider the dust and pollen control measures  Standard antihistamines like loratadine or fexofenadine are fine if they help  Sample Dymista nasal spray     1-2 puffs each nostril once every day at bedtime  We can discuss allergy vaccine as an option later as needed.   Please call as needed

## 2012-06-17 NOTE — Progress Notes (Signed)
Patient ID: Ashley [name], female    DOB: 09-Oct-1940, 71 y.o.   MRN: [id number]  HPI 71 yo F former smoker followed for allergic rhinitis with hx asthmatic bronchitis and acute rhinosinusitis.  We called in a Z pak for a cold with sinus complaints- she cleared completely for a month then over last 6 weeks she has had persistent nasal congestion, rhinorhea and cough with light yellow phlegm. Valtrex helped for canker sores with her cold. She is taking an anticongestant, otc . Main complaints are watery eyes, cough and sneeze.  05/15/11-  4 yo F former smoker followed for allergic rhinitis with hx asthmatic bronchitis and acute rhinosinusitis. We had sent Z pak in February.  She is coughing again. 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 Head congestion, cough, some drainage, no fever, no sore throat, minimal wheeze. GI ok.  Pending parathyroid surgery for hx of multiple kidney stones. Not taking prednisone.  06/24/11-   3 yo F former smoker followed for allergic rhinitis with hx asthmatic bronchitis and acute rhinosinusitis. Husband here today. 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 Several on that trip shared a cold. She had not cleared completely before leaving and used Zithromax at the beginning of that trip. She has been back a week, and now in the past 5 days has hacking cough, low-grade fever, clear to yellow sputum. Blowing her nose and coughing productively especially in the last 2 days. Some wheeze. Sore throat and yesterday. She has now taken 4 days of Cipro. Chest is not as tight as 2 days ago.  05/14/12- 13 yo F former smoker followed for allergic rhinitis with hx asthmatic bronchitis and acute rhinosinusitis. Unsure if she has true allergies-no complaints at this time Bilateral rib pains. Had GI workup for GERD-can't tell Prilosec helps the rib pain. We discussed nerve root irritation from arthritis in the  spine as one of the causes for this complaint. Rhinitis symptoms are worst in the spring and currently minor. No recent significant cough or wheeze. She is always thinking ahead to her next travel and necessary preparation including antibiotic to carry.  06/17/12- 44 yo F former smoker followed for allergic rhinitis with hx asthmatic bronchitis and acute rhinosinusitis. No antihistamines, OTC cough syrups, or sleep aids in past 3 days, Did have migraine this morning and took RX for that. Describes a burning/hurting eyes at inspiratory pressure. Variable sneeze cough and phlegm with occasional watery nose. Allergy Profile 05/14/2012: Total IgE 280.3 without specific elevation. Allergy Skin Testing: Positive for grass pollens, tree pollens, dust mite, some molds. We educated on environmental controls for dust and pollen, gave sample Dymista nasal spray and she can consider allergy vaccine as an option later if needed.   Review of Systems-See HPI Constitutional:   No weight loss, night sweats, fevers, chills, fatigue, lassitude. HEENT:   No headaches,  Difficulty swallowing,  Tooth/dental problems,  +Sore throats,  CV:  No-  chest pain,  no-Orthopnea, PND, swelling in lower extremities, anasarca, dizziness, palpitations GI  No heartburn, indigestion, abdominal pain, nausea, vomiting,  Resp: No shortness of breath with exertion or at rest.  No excess mucus, no- productive cough,  No non-productive cough,  No coughing up of blood.  No- change in color of mucus.  No- wheezing.  Skin: no rash or lesions. GU:  MS:  No joint pain or swelling.   Psych:  No change  in mood or affect. No depression or anxiety.  No memory loss.     Objective:   Physical Exam General- Alert, Oriented, Affect-appropriate, Distress- none acute Skin- rash-none, lesions- none, excoriation- none Lymphadenopathy- none Head- atraumatic            Eyes- Gross vision intact, PERRLA, conjunctivae clear secretions             Ears- Hearing, canals-normal            Nose- Clear, no-Septal dev, mucus, polyps, erosion, perforation             Throat- Mallampati II , mucosa clear , drainage- none, tonsils- atrophic Neck- flexible , trachea midline, no stridor , thyroid nl, carotid no bruit Chest - symmetrical excursion , unlabored           Heart/CV- RRR , no murmur , no gallop  , no rub, nl s1 s2                           - JVD- none , edema- none, stasis changes- none, varices- none           Lung- clear, unlabored,  dullness-none, rub- none           Chest wall-  Abd-  Br/ Gen/ Rectal- Not done, not indicated Extrem- cyanosis- none, clubbing, none, atrophy- none, strength- nl Neuro- grossly intact to observation

## 2012-06-19 NOTE — Assessment & Plan Note (Signed)
We discussed her allergy testing results and we reviewed guidelines for environmental dust and pollen control. Allergy vaccine would be an option later if needed. Her frequent international travel creates scheduling barriers Plan sample Dymista

## 2012-06-25 ENCOUNTER — Encounter: Payer: Self-pay | Admitting: Internal Medicine

## 2012-07-09 DIAGNOSIS — H18839 Recurrent erosion of cornea, unspecified eye: Secondary | ICD-10-CM | POA: Diagnosis not present

## 2012-07-10 ENCOUNTER — Observation Stay (HOSPITAL_COMMUNITY)
Admission: EM | Admit: 2012-07-10 | Discharge: 2012-07-10 | Disposition: A | Discharge status: Home / Self Care | Payer: Medicare Other | Attending: Emergency Medicine | Admitting: Emergency Medicine

## 2012-07-10 ENCOUNTER — Observation Stay (HOSPITAL_COMMUNITY): Payer: Medicare Other

## 2012-07-10 ENCOUNTER — Emergency Department (HOSPITAL_COMMUNITY): Payer: Medicare Other

## 2012-07-10 DIAGNOSIS — I10 Essential (primary) hypertension: Secondary | ICD-10-CM | POA: Diagnosis not present

## 2012-07-10 DIAGNOSIS — R4789 Other speech disturbances: Secondary | ICD-10-CM | POA: Diagnosis not present

## 2012-07-10 DIAGNOSIS — G459 Transient cerebral ischemic attack, unspecified: Principal | ICD-10-CM | POA: Insufficient documentation

## 2012-07-10 DIAGNOSIS — F29 Unspecified psychosis not due to a substance or known physiological condition: Secondary | ICD-10-CM | POA: Diagnosis not present

## 2012-07-10 DIAGNOSIS — I669 Occlusion and stenosis of unspecified cerebral artery: Secondary | ICD-10-CM | POA: Diagnosis not present

## 2012-07-10 DIAGNOSIS — R4701 Aphasia: Secondary | ICD-10-CM | POA: Insufficient documentation

## 2012-07-10 LAB — URINE MICROSCOPIC-ADD ON

## 2012-07-10 LAB — COMPREHENSIVE METABOLIC PANEL
ALT: 11 U/L (ref 0–35)
ALT: 12 U/L (ref 0–35)
AST: 18 U/L (ref 0–37)
AST: 16 U/L (ref 0–37)
Albumin: 3 g/dL — ABNORMAL LOW (ref 3.5–5.2)
Albumin: 3.3 g/dL — ABNORMAL LOW (ref 3.5–5.2)
CO2: 25 mEq/L (ref 19–32)
CO2: 30 mEq/L (ref 19–32)
Calcium: 9.2 mg/dL (ref 8.4–10.5)
Chloride: 103 mEq/L (ref 96–112)
Creatinine, Ser: 0.72 mg/dL (ref 0.50–1.10)
Creatinine, Ser: 0.76 mg/dL (ref 0.50–1.10)
GFR calc non Af Amer: 86 mL/min — ABNORMAL LOW (ref >90)
GFR calc non Af Amer: 84 mL/min — ABNORMAL LOW (ref >90)
Sodium: 137 mEq/L (ref 135–145)
Sodium: 140 mEq/L (ref 135–145)
Total Bilirubin: 0.6 mg/dL (ref 0.3–1.2)
Total Protein: 6.3 g/dL (ref 6.0–8.3)

## 2012-07-10 LAB — LIPID PANEL
HDL: 72 mg/dL (ref >39)
LDL Cholesterol: 169 mg/dL — ABNORMAL HIGH (ref 0–99)
Total CHOL/HDL Ratio: 3.7 RATIO
Triglycerides: 142 mg/dL (ref <150)

## 2012-07-10 LAB — CBC
Platelets: 187 10*3/uL (ref 150–400)
RBC: 4.95 MIL/uL (ref 3.87–5.11)
RDW: 12.4 % (ref 11.5–15.5)
WBC: 6.9 10*3/uL (ref 4.0–10.5)

## 2012-07-10 LAB — APTT: aPTT: 29 seconds (ref 24–37)

## 2012-07-10 LAB — URINALYSIS, ROUTINE W REFLEX MICROSCOPIC
Glucose, UA: NEGATIVE mg/dL
Ketones, ur: NEGATIVE mg/dL
Nitrite: NEGATIVE
Protein, ur: NEGATIVE mg/dL

## 2012-07-10 LAB — CBC WITH DIFFERENTIAL/PLATELET
Basophils Relative: 0 % (ref 0–1)
Eosinophils Absolute: 0.3 10*3/uL (ref 0.0–0.7)
Eosinophils Relative: 4 % (ref 0–5)
HCT: 43.3 % (ref 36.0–46.0)
Hemoglobin: 15.3 g/dL — ABNORMAL HIGH (ref 12.0–15.0)
Lymphs Abs: 2 10*3/uL (ref 0.7–4.0)
MCH: 31 pg (ref 26.0–34.0)
MCHC: 35.3 g/dL (ref 30.0–36.0)
MCV: 87.7 fL (ref 78.0–100.0)
Monocytes Absolute: 0.4 10*3/uL (ref 0.1–1.0)
Monocytes Relative: 7 % (ref 3–12)
RBC: 4.94 MIL/uL (ref 3.87–5.11)

## 2012-07-10 LAB — PROTIME-INR: INR: 0.97 (ref 0.00–1.49)

## 2012-07-10 LAB — HEMOGLOBIN A1C: Hgb A1c MFr Bld: 5.4 % (ref <5.7)

## 2012-07-10 MED ORDER — ASPIRIN EC 325 MG PO TBEC
325.0000 mg | DELAYED_RELEASE_TABLET | Freq: Once | ORAL | Status: AC
  Administered 2012-07-10: 325 mg via ORAL
  Filled 2012-07-10: qty 1
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
## 2012-07-10 NOTE — Progress Notes (Signed)
VASCULAR LAB PRELIMINARY  PRELIMINARY  PRELIMINARY  PRELIM  Carotid duplex completed.    Preliminary report:  Right :  No evidence of hemodynamically significant internal carotid artery stenosis.  Left :  60-79% internal carotid artery stenosis lowest end of scale in the bulb. Bilateral vertebral arteries are patent and flow is antegrade.  Ashley [name], RVS 07/10/2012, 1:54 PM

## 2012-07-10 NOTE — ED Notes (Signed)
Pt has been have ongoing problems with asphasia x 2 weeks. States she could not find her words yesterday when with a client. Also states that during this week she got lost driving home.

## 2012-07-10 NOTE — ED Notes (Signed)
Family at bedside. 

## 2012-07-10 NOTE — ED Provider Notes (Signed)
11:45:  Patient moved to CDU with presenting complaint of speech difficulty that has resolved. She will be placed on TIA protocol if pending head CT is negative. She has no complaints currently. Speech clear. Patient and husband aware of treatment plan and possible duration of visit.  1:45:  She continues to be comfortable without recurrent symptoms. Waiting on TIA lab/imaging to be completed for disposition.  Rodena Medin, PA-C 07/10/12 1349

## 2012-07-10 NOTE — ED Provider Notes (Signed)
Medical screening examination/treatment/procedure(s) were conducted as a shared visit with non-physician practitioner(s) and myself.  I personally evaluated the patient during the encounter  Doug Sou, MD 07/10/12 2035

## 2012-07-10 NOTE — ED Notes (Signed)
Patient is alert and orientedx4. Patient was explained discharge instructions and she did not have any questions.  Patient is being taken home by her husband, Ashley [name].

## 2012-07-10 NOTE — Progress Notes (Signed)
  Echocardiogram 2D Echocardiogram has been performed.  Ashley [name] 07/10/2012, 2:06 PM

## 2012-07-10 NOTE — ED Provider Notes (Signed)
Patient in CDU under TIA protocol.  Workup completed, lab and radiology results reviewed and discussed with Dr. Ethelda Chick prior to the end of his shift, and shared with patient.   Patient resting comfortably with family at bedside.  Conscious, alert.  MOE x 4, distal pulses and sensation intact.  Lungs CTA bilaterally.  S1/S2, RRR.  Abdomen soft, bowel sounds present.  Patient seen by neurology Anne Hahn).  Patient to be discharged home with follow-up with her PCP.  Jimmye Norman, NP 07/10/12 2240

## 2012-07-10 NOTE — ED Notes (Signed)
Report called to [place] , RN

## 2012-07-10 NOTE — ED Notes (Signed)
Patient is resting comfortably. 

## 2012-07-10 NOTE — ED Provider Notes (Signed)
History     CSN: [id number]  Arrival date & time 07/10/12  6644   First MD Initiated Contact with Patient 07/10/12 1021      Chief Complaint  Patient presents with  . Aphasia    (Consider location/radiation/quality/duration/timing/severity/associated sxs/prior treatment) HPI Patient reports that she has had trouble thinking of what she wants to say for the past 3-4 days she reports that 3 days ago she turned the wrong way down her street. She also reports that yesterday she could not speak for approximately 1 minute symptoms resolved spontaneously patient is presently asymptomatic except from mouth pain that she's had for several months. No treatment prior to coming here. Nothing makes symptoms better or worse Past Medical History  Diagnosis Date  . Chronic rhinosinusitis     allergy skin testing positive 10-05-03- did not try allergy vaccine  . Arthritis     back and neck  . Hypertension     denies other cardiac hx  . Depression   . IBS (irritable bowel syndrome)   . History of kidney stones     MULTIBLE SURGICAL INTERVENTIONS  . Right ureteral stone   . Diverticulosis of colon   . Mild asthma     COLD INDUCED  . DDD (degenerative disc disease)   . Spondylosis   . Headache   . H/O constipation   . Frequency of urination   . Skin cancer of face S/P TOPICAL TX 03-23-2012    NOW HAS RED BURN ON FACE    Past Surgical History  Procedure Date  . [place] luc     sinus drainage procedure  . Tonsilectomy, adenoidectomy, bilateral myringotomy and tubes CHILD  . Bunionectomy     BILATERAL  . Hernia repair     age 53  . Extracorporeal shock wave lithotripsy about 1997/ Dr Wanda Plump    Never completed procedure-could not tolerate pain. Had seizures due to pain  . Right ureteroscopic stone extraction LAST ONE 02-21-2011    MULTIPLE SURGICAL STONE EXTRACTIONS  . Left ureteroscopic stone extraction   LAST ONE 04-28-2009    SEVERAL SURGICAL STONE EXTRACTIONS  . Cysto/  bilateral ureteroscopy/  laser of stones left side 06-02-2007  . Laparoscopic cholecystectomy 01-12-2005  . Cysto/ bilateral ureteroscopic stone extractions 02-09-2003  . Lumbar laminectomy 09-30-2002    L4 - 5;  SPONDYLOLISTHISES W/ STENOSIS AND RADICULOPATHY  . Percutaneous nephrostolithotomy 1975  . Eye surgery AGE 71    INJURY  . Vaginal hysterectomy 1991    W/ BILATERAL SALPINGOOPHECTOMY  . Cystoscopy/retrograde/ureteroscopy/stone extraction with basket 03/31/2012    Procedure: CYSTOSCOPY/RETROGRADE/URETEROSCOPY/STONE EXTRACTION WITH BASKET;  Surgeon: Anner Crete, MD;  Location: [facility];  Service: Urology;  Laterality: Right;  c-arm holmium laser  Intraop  B&O Supp.  Tordol 15 IV  Zofron 4 m IV    Family History  Problem Relation Age of Onset  . COPD Mother   . Colon cancer Neg Hx   . Breast cancer Mother     History  Substance Use Topics  . Smoking status: Former Smoker -- 23 years    Types: Cigarettes    Quit date: 09/16/1985  . Smokeless tobacco: Never Used  . Alcohol Use: Yes     social - weekend    OB History    Grav Para Term Preterm Abortions TAB SAB Ect Mult Living                  Review of Systems  Constitutional: Negative.  HENT: Negative.        Mouth pain  Respiratory: Negative.   Cardiovascular: Negative.   Gastrointestinal: Negative.   Musculoskeletal: Negative.   Skin: Negative.   Neurological: Positive for speech difficulty.  Hematological: Negative.   Psychiatric/Behavioral: Negative.   All other systems reviewed and are negative.    Allergies  Review of patient's allergies indicates no known allergies.  Home Medications   Current Outpatient Rx  Name Route Sig Dispense Refill  . ALISKIREN FUMARATE 150 MG PO TABS Oral Take 150 mg by mouth daily.     . CELECOXIB 200 MG PO CAPS Oral Take 200 mg by mouth daily as needed. For back pain    . DESIPRAMINE HCL 25 MG PO TABS Oral Take 25 mg by mouth daily.     .  STOOL SOFTENER PO Oral Take by mouth as needed.     Marland Kitchen ESTRADIOL 1 MG PO TABS Oral Take 1 mg by mouth daily.    . OMEGA-3 FATTY ACIDS 1000 MG PO CAPS Oral Take 2 g by mouth daily.     Marland Kitchen HYDROCODONE-ACETAMINOPHEN 5-325 MG PO TABS Oral Take 0.5 tablets by mouth every 4 (four) hours as needed. For pain    . MULTIVITAMIN PO Oral Take by mouth daily.     . SODIUM CHLORIDE (HYPERTONIC) 2 % OP SOLN Right Eye Place 1 drop into the right eye 4 (four) times daily.      BP 156/83  Temp 99 F (37.2 C) (Oral)  Resp 20  SpO2 97%  Physical Exam  Nursing note and vitals reviewed. Constitutional: She is oriented to person, place, and time. She appears well-developed and well-nourished.  HENT:  Head: Normocephalic and atraumatic.  Mouth/Throat: Oropharynx is clear and moist.  Eyes: Conjunctivae normal are normal. Pupils are equal, round, and reactive to light.  Neck: Neck supple. No tracheal deviation present. No thyromegaly present.  Cardiovascular: Normal rate and regular rhythm.   No murmur heard. Pulmonary/Chest: Effort normal and breath sounds normal.  Abdominal: Soft. Bowel sounds are normal. She exhibits no distension. There is no tenderness.  Musculoskeletal: Normal range of motion. She exhibits no edema and no tenderness.  Neurological: She is alert and oriented to person, place, and time. She has normal reflexes. Coordination normal.       Gait normal Romberg normal pronator drift normal  Skin: Skin is warm and dry. No rash noted.  Psychiatric: She has a normal mood and affect.    ED Course  Procedures (including critical care time)  Date: 07/10/2012  Rate: 90  Rhythm: normal sinus rhythm  QRS Axis: normal  Intervals: normal  ST/T Wave abnormalities: normal  Conduction Disutrbances: none  Narrative Interpretation: unremarkable No significant change from 03/31/2012 as interpreted by me    Labs Reviewed  URINALYSIS, ROUTINE W REFLEX MICROSCOPIC  CBC WITH DIFFERENTIAL    COMPREHENSIVE METABOLIC PANEL   No results found.   No diagnosis found.    MDM   patient does not meet criteria for code stroke. Exam is normal presently. Last episode of aphasia or than 12 hours ago . Check CT scan of head is negative suggest CDU TIA protocol         Doug Sou, MD 07/10/12 2035

## 2012-07-11 LAB — URINE CULTURE

## 2012-07-13 ENCOUNTER — Other Ambulatory Visit: Payer: Self-pay | Admitting: Family Medicine

## 2012-07-13 DIAGNOSIS — Z1231 Encounter for screening mammogram for malignant neoplasm of breast: Secondary | ICD-10-CM

## 2012-07-16 DIAGNOSIS — Z23 Encounter for immunization: Secondary | ICD-10-CM | POA: Diagnosis not present

## 2012-07-16 DIAGNOSIS — M1990 Unspecified osteoarthritis, unspecified site: Secondary | ICD-10-CM | POA: Diagnosis not present

## 2012-07-16 DIAGNOSIS — F329 Major depressive disorder, single episode, unspecified: Secondary | ICD-10-CM | POA: Diagnosis not present

## 2012-07-16 DIAGNOSIS — N951 Menopausal and female climacteric states: Secondary | ICD-10-CM | POA: Diagnosis not present

## 2012-07-16 DIAGNOSIS — G459 Transient cerebral ischemic attack, unspecified: Secondary | ICD-10-CM | POA: Diagnosis not present

## 2012-07-16 DIAGNOSIS — R51 Headache: Secondary | ICD-10-CM | POA: Diagnosis not present

## 2012-07-16 DIAGNOSIS — E782 Mixed hyperlipidemia: Secondary | ICD-10-CM | POA: Diagnosis not present

## 2012-07-20 ENCOUNTER — Telehealth: Payer: Self-pay | Admitting: Internal Medicine

## 2012-07-20 DIAGNOSIS — H18839 Recurrent erosion of cornea, unspecified eye: Secondary | ICD-10-CM | POA: Diagnosis not present

## 2012-07-20 MED ORDER — AZELASTINE-FLUTICASONE 137-50 MCG/ACT NA SUSP: 1.0000 | Freq: Every day | NASAL | Status: DC

## 2012-07-20 NOTE — Telephone Encounter (Signed)
LMOM that rx for Dymista was sent to pharmacy per pt request.

## 2012-07-21 DIAGNOSIS — M5030 Other cervical disc degeneration, unspecified cervical region: Secondary | ICD-10-CM | POA: Diagnosis not present

## 2012-07-21 DIAGNOSIS — M999 Biomechanical lesion, unspecified: Secondary | ICD-10-CM | POA: Diagnosis not present

## 2012-07-21 DIAGNOSIS — M9981 Other biomechanical lesions of cervical region: Secondary | ICD-10-CM | POA: Diagnosis not present

## 2012-07-21 DIAGNOSIS — M5137 Other intervertebral disc degeneration, lumbosacral region: Secondary | ICD-10-CM | POA: Diagnosis not present

## 2012-08-03 ENCOUNTER — Ambulatory Visit
Admission: RE | Admit: 2012-08-03 | Discharge: 2012-08-03 | Disposition: A | Discharge status: Home / Self Care | Payer: Medicare Other | Source: Ambulatory Visit | Attending: Family Medicine | Admitting: Family Medicine

## 2012-08-03 DIAGNOSIS — N951 Menopausal and female climacteric states: Secondary | ICD-10-CM | POA: Diagnosis not present

## 2012-08-03 DIAGNOSIS — Z1231 Encounter for screening mammogram for malignant neoplasm of breast: Secondary | ICD-10-CM | POA: Diagnosis not present

## 2012-08-03 DIAGNOSIS — E782 Mixed hyperlipidemia: Secondary | ICD-10-CM | POA: Diagnosis not present

## 2012-08-03 DIAGNOSIS — G459 Transient cerebral ischemic attack, unspecified: Secondary | ICD-10-CM | POA: Diagnosis not present

## 2012-08-03 DIAGNOSIS — F329 Major depressive disorder, single episode, unspecified: Secondary | ICD-10-CM | POA: Diagnosis not present

## 2012-08-12 DIAGNOSIS — G459 Transient cerebral ischemic attack, unspecified: Secondary | ICD-10-CM | POA: Diagnosis not present

## 2012-08-12 DIAGNOSIS — E782 Mixed hyperlipidemia: Secondary | ICD-10-CM | POA: Diagnosis not present

## 2012-08-12 DIAGNOSIS — R51 Headache: Secondary | ICD-10-CM | POA: Diagnosis not present

## 2012-08-19 DIAGNOSIS — R82998 Other abnormal findings in urine: Secondary | ICD-10-CM | POA: Diagnosis not present

## 2012-08-19 DIAGNOSIS — R350 Frequency of micturition: Secondary | ICD-10-CM | POA: Diagnosis not present

## 2012-08-19 DIAGNOSIS — N200 Calculus of kidney: Secondary | ICD-10-CM | POA: Diagnosis not present

## 2012-08-20 DIAGNOSIS — H2510 Age-related nuclear cataract, unspecified eye: Secondary | ICD-10-CM | POA: Diagnosis not present

## 2012-08-24 DIAGNOSIS — I10 Essential (primary) hypertension: Secondary | ICD-10-CM | POA: Diagnosis not present

## 2012-08-24 DIAGNOSIS — J069 Acute upper respiratory infection, unspecified: Secondary | ICD-10-CM | POA: Diagnosis not present

## 2012-08-26 DIAGNOSIS — I679 Cerebrovascular disease, unspecified: Secondary | ICD-10-CM | POA: Diagnosis not present

## 2012-08-26 DIAGNOSIS — G43009 Migraine without aura, not intractable, without status migrainosus: Secondary | ICD-10-CM | POA: Diagnosis not present

## 2012-08-26 DIAGNOSIS — R4701 Aphasia: Secondary | ICD-10-CM | POA: Diagnosis not present

## 2012-09-03 DIAGNOSIS — M5137 Other intervertebral disc degeneration, lumbosacral region: Secondary | ICD-10-CM | POA: Diagnosis not present

## 2012-09-03 DIAGNOSIS — M4310 Spondylolisthesis, site unspecified: Secondary | ICD-10-CM | POA: Diagnosis not present

## 2012-09-03 DIAGNOSIS — M47812 Spondylosis without myelopathy or radiculopathy, cervical region: Secondary | ICD-10-CM | POA: Diagnosis not present

## 2012-09-03 DIAGNOSIS — M19079 Primary osteoarthritis, unspecified ankle and foot: Secondary | ICD-10-CM | POA: Diagnosis not present

## 2012-09-08 DIAGNOSIS — J40 Bronchitis, not specified as acute or chronic: Secondary | ICD-10-CM | POA: Diagnosis not present

## 2012-09-08 DIAGNOSIS — I10 Essential (primary) hypertension: Secondary | ICD-10-CM | POA: Diagnosis not present

## 2012-09-24 DIAGNOSIS — G43009 Migraine without aura, not intractable, without status migrainosus: Secondary | ICD-10-CM | POA: Diagnosis not present

## 2012-09-24 DIAGNOSIS — R4701 Aphasia: Secondary | ICD-10-CM | POA: Diagnosis not present

## 2012-09-28 DIAGNOSIS — G459 Transient cerebral ischemic attack, unspecified: Secondary | ICD-10-CM | POA: Diagnosis not present

## 2012-09-28 DIAGNOSIS — I10 Essential (primary) hypertension: Secondary | ICD-10-CM | POA: Diagnosis not present

## 2012-09-28 DIAGNOSIS — E782 Mixed hyperlipidemia: Secondary | ICD-10-CM | POA: Diagnosis not present

## 2012-09-28 DIAGNOSIS — N951 Menopausal and female climacteric states: Secondary | ICD-10-CM | POA: Diagnosis not present

## 2012-09-28 DIAGNOSIS — F329 Major depressive disorder, single episode, unspecified: Secondary | ICD-10-CM | POA: Diagnosis not present

## 2012-10-01 DIAGNOSIS — H18839 Recurrent erosion of cornea, unspecified eye: Secondary | ICD-10-CM | POA: Diagnosis not present

## 2012-11-02 DIAGNOSIS — L708 Other acne: Secondary | ICD-10-CM | POA: Diagnosis not present

## 2012-11-02 DIAGNOSIS — L719 Rosacea, unspecified: Secondary | ICD-10-CM | POA: Diagnosis not present

## 2012-11-09 ENCOUNTER — Other Ambulatory Visit: Payer: Self-pay | Admitting: Family Medicine

## 2012-11-09 DIAGNOSIS — E7800 Pure hypercholesterolemia, unspecified: Secondary | ICD-10-CM | POA: Diagnosis not present

## 2012-11-09 DIAGNOSIS — N644 Mastodynia: Secondary | ICD-10-CM

## 2012-11-09 DIAGNOSIS — I10 Essential (primary) hypertension: Secondary | ICD-10-CM | POA: Diagnosis not present

## 2012-11-09 DIAGNOSIS — G459 Transient cerebral ischemic attack, unspecified: Secondary | ICD-10-CM | POA: Diagnosis not present

## 2012-11-09 DIAGNOSIS — N951 Menopausal and female climacteric states: Secondary | ICD-10-CM | POA: Diagnosis not present

## 2012-11-12 DIAGNOSIS — E875 Hyperkalemia: Secondary | ICD-10-CM | POA: Diagnosis not present

## 2012-11-25 ENCOUNTER — Other Ambulatory Visit: Payer: Self-pay | Admitting: Family Medicine

## 2012-11-25 ENCOUNTER — Ambulatory Visit
Admission: RE | Admit: 2012-11-25 | Discharge: 2012-11-25 | Disposition: A | Discharge status: Home / Self Care | Payer: Medicare Other | Source: Ambulatory Visit | Attending: Family Medicine | Admitting: Family Medicine

## 2012-11-25 DIAGNOSIS — N644 Mastodynia: Secondary | ICD-10-CM

## 2012-11-25 DIAGNOSIS — Z803 Family history of malignant neoplasm of breast: Secondary | ICD-10-CM | POA: Diagnosis not present

## 2012-11-25 DIAGNOSIS — N6459 Other signs and symptoms in breast: Secondary | ICD-10-CM | POA: Diagnosis not present

## 2012-12-09 DIAGNOSIS — L719 Rosacea, unspecified: Secondary | ICD-10-CM | POA: Diagnosis not present

## 2012-12-09 DIAGNOSIS — N200 Calculus of kidney: Secondary | ICD-10-CM | POA: Diagnosis not present

## 2012-12-09 DIAGNOSIS — R350 Frequency of micturition: Secondary | ICD-10-CM | POA: Diagnosis not present

## 2012-12-09 DIAGNOSIS — Z85828 Personal history of other malignant neoplasm of skin: Secondary | ICD-10-CM | POA: Diagnosis not present

## 2012-12-16 ENCOUNTER — Ambulatory Visit: Payer: Medicare Other | Admitting: Internal Medicine

## 2012-12-24 DIAGNOSIS — M62838 Other muscle spasm: Secondary | ICD-10-CM | POA: Diagnosis not present

## 2012-12-24 DIAGNOSIS — M999 Biomechanical lesion, unspecified: Secondary | ICD-10-CM | POA: Diagnosis not present

## 2012-12-24 DIAGNOSIS — M9981 Other biomechanical lesions of cervical region: Secondary | ICD-10-CM | POA: Diagnosis not present

## 2012-12-24 DIAGNOSIS — M5430 Sciatica, unspecified side: Secondary | ICD-10-CM | POA: Diagnosis not present

## 2012-12-25 ENCOUNTER — Ambulatory Visit (INDEPENDENT_AMBULATORY_CARE_PROVIDER_SITE_OTHER): Payer: Medicare Other | Admitting: Internal Medicine

## 2012-12-25 DIAGNOSIS — Z23 Encounter for immunization: Secondary | ICD-10-CM | POA: Diagnosis not present

## 2012-12-25 MED ORDER — CIPROFLOXACIN HCL 500 MG PO TABS: 500.0000 mg | ORAL_TABLET | Freq: Two times a day (BID) | ORAL | Status: DC

## 2012-12-25 MED ORDER — FLUCONAZOLE 150 MG PO TABS: 150.0000 mg | ORAL_TABLET | Freq: Once | ORAL | Status: DC

## 2012-12-25 MED ORDER — DOXYCYCLINE HYCLATE 100 MG PO TABS: 100.0000 mg | ORAL_TABLET | Freq: Every day | ORAL | Status: DC

## 2012-12-27 NOTE — Progress Notes (Signed)
Patient ID: Ashley [name], female   DOB: Nov 06, 1940, 72 y.o.   MRN: [id number]  See smarttext  Subjective:    Ashley [name] is a 72 y.o. female who presents to the Infectious Disease clinic for travel consultation. Planned departure date: May          Planned return date: 3 weeks Countries of travel: [place] and [place] and [place] Areas in country: rural and urban   Accommodations: hotel Purpose of travel: vacation Prior travel out of [place]: yes Currently ill / Fever: no History of liver or kidney disease: no  Data Review:  none The following portions of the patient's history were reviewed and updated as appropriate: allergies, current medications, past family history, past medical history, past social history, past surgical history and problem list.  Review of Systems Pertinent items are noted in HPI.    Objective:    No exam performed today, not needed.    Assessment:    No contraindications to travel. none      Plan:    Issues discussed: environmental concerns, freshwater swimming, future shots, insect-borne illnesses, jet lag, malaria, MVA safety, rabies, safe food/water, traveler's diarrhea, website/handouts for more information, what to do if ill upon return, what to do if ill while there and Yellow Fever. Immunizations recommended: Hepatitis A series, Typhoid (oral) and Yellow Fever.Typhoid IM, Tdap Malaria prophylaxis: malarone, daily dose starting 1-2 days before entering endemic area, ending 7 days after leaving area Traveler's diarrhea prophylaxis: ciprofloxacin. Total duration of visit: 30 Minutes. Total time spent on education, counseling, coordination of care: 15 Minutes.

## 2012-12-29 DIAGNOSIS — I10 Essential (primary) hypertension: Secondary | ICD-10-CM | POA: Diagnosis not present

## 2012-12-29 DIAGNOSIS — T753XXA Motion sickness, initial encounter: Secondary | ICD-10-CM | POA: Diagnosis not present

## 2012-12-29 DIAGNOSIS — R6884 Jaw pain: Secondary | ICD-10-CM | POA: Diagnosis not present

## 2013-01-13 DIAGNOSIS — R82998 Other abnormal findings in urine: Secondary | ICD-10-CM | POA: Diagnosis not present

## 2013-01-13 DIAGNOSIS — N201 Calculus of ureter: Secondary | ICD-10-CM | POA: Diagnosis not present

## 2013-01-13 DIAGNOSIS — N200 Calculus of kidney: Secondary | ICD-10-CM | POA: Diagnosis not present

## 2013-01-14 DIAGNOSIS — N23 Unspecified renal colic: Secondary | ICD-10-CM | POA: Diagnosis not present

## 2013-01-14 DIAGNOSIS — N201 Calculus of ureter: Secondary | ICD-10-CM | POA: Diagnosis not present

## 2013-01-14 DIAGNOSIS — N200 Calculus of kidney: Secondary | ICD-10-CM | POA: Diagnosis not present

## 2013-01-15 DIAGNOSIS — R339 Retention of urine, unspecified: Secondary | ICD-10-CM | POA: Diagnosis not present

## 2013-01-15 DIAGNOSIS — N201 Calculus of ureter: Secondary | ICD-10-CM | POA: Diagnosis not present

## 2013-01-15 DIAGNOSIS — N200 Calculus of kidney: Secondary | ICD-10-CM | POA: Diagnosis not present

## 2013-01-18 DIAGNOSIS — N201 Calculus of ureter: Secondary | ICD-10-CM | POA: Diagnosis not present

## 2013-01-18 DIAGNOSIS — N200 Calculus of kidney: Secondary | ICD-10-CM | POA: Diagnosis not present

## 2013-01-20 DIAGNOSIS — N201 Calculus of ureter: Secondary | ICD-10-CM | POA: Diagnosis not present

## 2013-01-20 DIAGNOSIS — N200 Calculus of kidney: Secondary | ICD-10-CM | POA: Diagnosis not present

## 2013-01-20 DIAGNOSIS — R3129 Other microscopic hematuria: Secondary | ICD-10-CM | POA: Diagnosis not present

## 2013-01-22 ENCOUNTER — Encounter: Payer: Medicare Other | Admitting: Internal Medicine

## 2013-01-22 DIAGNOSIS — H40019 Open angle with borderline findings, low risk, unspecified eye: Secondary | ICD-10-CM | POA: Diagnosis not present

## 2013-01-25 ENCOUNTER — Other Ambulatory Visit: Payer: Self-pay | Admitting: Urology

## 2013-01-25 DIAGNOSIS — N200 Calculus of kidney: Secondary | ICD-10-CM | POA: Diagnosis not present

## 2013-01-25 DIAGNOSIS — N201 Calculus of ureter: Secondary | ICD-10-CM | POA: Diagnosis not present

## 2013-01-27 ENCOUNTER — Encounter (HOSPITAL_BASED_OUTPATIENT_CLINIC_OR_DEPARTMENT_OTHER): Payer: Self-pay | Admitting: *Deleted

## 2013-01-27 DIAGNOSIS — N201 Calculus of ureter: Secondary | ICD-10-CM | POA: Diagnosis not present

## 2013-01-27 DIAGNOSIS — R82998 Other abnormal findings in urine: Secondary | ICD-10-CM | POA: Diagnosis not present

## 2013-01-27 DIAGNOSIS — R350 Frequency of micturition: Secondary | ICD-10-CM | POA: Diagnosis not present

## 2013-01-27 NOTE — H&P (Signed)
ctive Problems Problems  1. Distal Ureteral Stone On The Left 592.1 2. Hydronephrosis On The Left 591 3. Microscopic Hematuria 599.72 4. Nephrolithiasis Bilateral 592.0 5. Pyuria 791.9 6. Renal Colic Left 788.0 7. Ureteral Stone Right 592.1 8. Urinary Frequency 788.41  History of Present Illness  Zanai returns today with persistant discomfort from her left distal stone.  A KUB today shows the stone is unchanged and her UA remains abnormal.  Her pain is not severe but bothersome and she has plans to go to [place] at the end of the month.   Past Medical History Problems  1. History of  Constipation 564.00 2. History of  Depression 311 3. History of  Diffuse Abdominal Pain 789.07 4. History of  Distal Ureteral Stone On The Right 592.1 5. History of  Lumbar Disc Degeneration 722.52 6. Nephrolithiasis Bilateral 592.0 7. History of  Proximal Ureteral Stone On The Right 592.1 8. History of  Spinal Stenosis 724.00 9. History of  Stroke Syndrome 436 10. History of  Urinary Tract Infection V13.02  Surgical History Problems  1. History of  Cholecystectomy 2. History of  Cystoscopy With Ureteroscopy With Lithotripsy 3. History of  Cystoscopy With Ureteroscopy With Lithotripsy 4. History of  Cystoscopy With Ureteroscopy With Lithotripsy 5. History of  Cystoscopy With Ureteroscopy With Removal Of Calculus 6. History of  Cystoscopy With Ureteroscopy With Removal Of Calculus 7. History of  Cystoscopy With Ureteroscopy With Removal Of Calculus 8. History of  Cystoscopy With Ureteroscopy With Removal Of Calculus 9. History of  Foot Surgery 10. History of  Hysterectomy 11. History of  Inguinal Hernia Repair 12. History of  Kidney Surgery 13. History of  Lithotripsy 14. History of  Lithotripsy 15. History of  Lower Back Surgery 16. History of  Tonsillectomy 17. History of  Ureterolithotomy  Current Meds 1. CeleBREX CAPS; Therapy: (Recorded:03Oct2011) to 2. Crestor 5 MG Oral Tablet;  Therapy: 23Jan2014 to 3. Desipramine HCl 25 MG Oral Tablet; Therapy: (Recorded:08Sep2008) to 4. Dymista 137-50 MCG/ACT Nasal Suspension; Therapy: 04Nov2013 to 5. Estradiol TABS; Therapy: (Recorded:08Sep2008) to 6. Fish Oil CAPS; Therapy: (Recorded:08Sep2008) to 7. Fluconazole 150 MG Oral Tablet; Therapy: 18Sep2013 to 8. Hydrocodone-Acetaminophen 5-325 MG Oral Tablet; take  1-2 tab po q 4 hours prn pain; Therapy:  13Aug2010 to (Last Rx:21Feb2011) 9. HYDROmorphone HCl 4 MG Oral Tablet; 1 - 2 tabs q 4 - 6 Hrs. PRN pain; Therapy: 01May2014 to  (Last Rx:01May2014) 10. Irbesartan 150 MG Oral Tablet; Therapy: 24Dec2013 to 11. Mefloquine HCl 250 MG Oral Tablet; Therapy: 15Apr2014 to 12. Multi-Vitamin Oral Tablet; with out iron; Therapy: (Recorded:08Sep2008) to 13. Ondansetron 4 MG Oral Tablet Dispersible; TAKE 1 TABLET Every 4 hours PRN nausea or   vomiting; Therapy: 01May2014 to (Last Rx:01May2014)  Requested for: 01May2014 14. Ondansetron 8 MG Oral Tablet Dispersible; Therapy: 15Apr2014 to 15. Rizatriptan Benzoate 10 MG Oral Tablet Dispersible; Therapy: 22May2013 to 16. Sprix 15.75 MG/SPRAY Nasal Solution; 1spray in 1 nostril q6-8 hours; maximum for sprays/day;   Therapy: 02May2014 to (Last Rx:02May2014)  Requested for: 02May2014 17. Tamsulosin HCl 0.4 MG Oral Capsule; Take one tablet daily at bedtime; Therapy: 01May2014 to   (Complete:31May2014)  Requested for: 01May2014; Last Rx:01May2014 18. Tekturna TABS; Therapy: (Recorded:05Mar2009) to 19. Zolpidem Tartrate 5 MG Oral Tablet; Therapy: 04Apr2013 to  Allergies Medication  1. No Known Drug Allergies  Family History Problems  1. Maternal history of  Chronic Obstructive Pulmonary Disease 2. Maternal history of  Congestive Heart Failure  Social History Problems  1. Alcohol  Use Social 2. Former Smoker Quit around [employer]. 3. Marital History - Currently Married 4. Occupation: IT consultant  Review of Systems  Genitourinary:  urinary urgency and suprapubic pain.  ENT: sinus problems.  Cardiovascular: no chest pain.  Respiratory: no shortness of breath.    Vitals Vital Signs [Data Includes: Last 1 Day]  12May2014 03:32PM  Blood Pressure: 142 / 80 Temperature: 97 F Heart Rate: 88  Physical Exam Constitutional: Well nourished and well developed . No acute distress.  Pulmonary: No respiratory distress and normal respiratory rhythm and effort.  Cardiovascular: Heart rate and rhythm are normal . No peripheral edema.  Abdomen: The abdomen is soft and nontender (with the exception of mild RLQ pain. ).    Results/Data Urine [Data Includes: Last 1 Day]   12May2014  COLOR YELLOW   APPEARANCE CLOUDY   SPECIFIC GRAVITY 1.020   pH 6.0   GLUCOSE NEG mg/dL  BILIRUBIN NEG   KETONE NEG mg/dL  BLOOD SMALL   PROTEIN NEG mg/dL  UROBILINOGEN 0.2 mg/dL  NITRITE NEG   LEUKOCYTE ESTERASE SMALL   SQUAMOUS EPITHELIAL/HPF FEW   WBC 11-20 WBC/hpf  RBC 3-6 RBC/hpf  BACTERIA RARE   CRYSTALS NONE SEEN   CASTS NONE SEEN    The following images/tracing/specimen were independently visualized:  KUB reviewed. She has stable bilateral renal stones and a persistant calcification in the area of the left UVJ. No right ureteral stones are seen.    Assessment Assessed  1. Distal Ureteral Stone On The Left 592.1   She has a symptomatic left distal stone.   Plan Distal Ureteral Stone On The Left (592.1)  1. Follow-up Schedule Surgery Office  Follow-up  Done: 12May2014 Health Maintenance (V70.0)  2. UA With REFLEX  Done: 12May2014 02:51PM Nephrolithiasis (592.0)  3. KUB  Done: 12May2014 12:00AM   I am going to set her up for left ureterscopy this week.  The risks were reviewed including bleeding, infection, ureteral injury and anesthetic complications.

## 2013-01-27 NOTE — Progress Notes (Signed)
PT STATES HAD APPT. W/ DR WRENN TODAY, POSS. PAST STONE. IF PT IS STILL HAVING PROCUDURE , SHE WILL COME BY HERE FOR INSTRUCTIONS.

## 2013-01-28 ENCOUNTER — Ambulatory Visit (HOSPITAL_BASED_OUTPATIENT_CLINIC_OR_DEPARTMENT_OTHER): Admission: RE | Admit: 2013-01-28 | Payer: Medicare Other | Source: Ambulatory Visit | Admitting: Urology

## 2013-01-28 ENCOUNTER — Encounter (HOSPITAL_BASED_OUTPATIENT_CLINIC_OR_DEPARTMENT_OTHER): Admission: RE | Payer: Self-pay | Source: Ambulatory Visit

## 2013-01-28 HISTORY — DX: Personal history of other malignant neoplasm of skin: Z85.828

## 2013-01-28 HISTORY — DX: Other allergic rhinitis: J30.89

## 2013-01-28 HISTORY — DX: Other seasonal allergic rhinitis: J30.2

## 2013-01-28 SURGERY — CYSTOSCOPY, WITH CALCULUS REMOVAL USING BASKET
Anesthesia: Choice | Laterality: Left

## 2013-01-29 ENCOUNTER — Encounter: Payer: Medicare Other | Admitting: Internal Medicine

## 2013-01-29 DIAGNOSIS — N200 Calculus of kidney: Secondary | ICD-10-CM | POA: Diagnosis not present

## 2013-02-01 DIAGNOSIS — T753XXA Motion sickness, initial encounter: Secondary | ICD-10-CM | POA: Diagnosis not present

## 2013-02-01 DIAGNOSIS — I10 Essential (primary) hypertension: Secondary | ICD-10-CM | POA: Diagnosis not present

## 2013-02-03 ENCOUNTER — Telehealth: Payer: Self-pay | Admitting: Internal Medicine

## 2013-02-03 MED ORDER — PREDNISONE 10 MG PO TABS: ORAL_TABLET | ORAL | Status: DC

## 2013-02-03 NOTE — Telephone Encounter (Signed)
i spoke with pt and is aware rx has been sent 

## 2013-02-03 NOTE — Telephone Encounter (Signed)
Per CY-okay to send Prednisone 10 mg #20 take 4 x 2 days, 3 x 2 days, 2 x 2 days, 1 x 2 days, then stop no refills.

## 2013-02-03 NOTE — Telephone Encounter (Signed)
I spoke with pt and she is going to [place] next week. She is requesting to have RX for prednisone to have on hand if she needs it. Please advise Dr. Maple Hudson thanks Last OV 06/17/12 No pending appt No Known Allergies

## 2013-02-04 DIAGNOSIS — N200 Calculus of kidney: Secondary | ICD-10-CM | POA: Diagnosis not present

## 2013-02-09 DIAGNOSIS — M5430 Sciatica, unspecified side: Secondary | ICD-10-CM | POA: Diagnosis not present

## 2013-02-09 DIAGNOSIS — M9981 Other biomechanical lesions of cervical region: Secondary | ICD-10-CM | POA: Diagnosis not present

## 2013-02-09 DIAGNOSIS — M62838 Other muscle spasm: Secondary | ICD-10-CM | POA: Diagnosis not present

## 2013-02-09 DIAGNOSIS — M999 Biomechanical lesion, unspecified: Secondary | ICD-10-CM | POA: Diagnosis not present

## 2013-03-22 DIAGNOSIS — N201 Calculus of ureter: Secondary | ICD-10-CM | POA: Diagnosis not present

## 2013-03-29 ENCOUNTER — Ambulatory Visit (HOSPITAL_COMMUNITY): Payer: Medicare Other

## 2013-03-29 ENCOUNTER — Encounter (HOSPITAL_COMMUNITY)
Admission: RE | Disposition: A | Discharge status: Home / Self Care | Payer: Self-pay | Source: Ambulatory Visit | Attending: Urology

## 2013-03-29 ENCOUNTER — Other Ambulatory Visit: Payer: Self-pay | Admitting: Urology

## 2013-03-29 ENCOUNTER — Observation Stay (HOSPITAL_COMMUNITY)
Admission: RE | Admit: 2013-03-29 | Discharge: 2013-03-30 | Disposition: A | Discharge status: Home / Self Care | Payer: Medicare Other | Source: Ambulatory Visit | Attending: Urology | Admitting: Urology

## 2013-03-29 ENCOUNTER — Encounter (HOSPITAL_COMMUNITY): Payer: Self-pay | Admitting: General Practice

## 2013-03-29 ENCOUNTER — Encounter (HOSPITAL_COMMUNITY): Payer: Self-pay | Admitting: Certified Registered"

## 2013-03-29 ENCOUNTER — Ambulatory Visit (HOSPITAL_COMMUNITY): Payer: Medicare Other | Admitting: Certified Registered"

## 2013-03-29 DIAGNOSIS — K5730 Diverticulosis of large intestine without perforation or abscess without bleeding: Secondary | ICD-10-CM | POA: Diagnosis not present

## 2013-03-29 DIAGNOSIS — Z01818 Encounter for other preprocedural examination: Secondary | ICD-10-CM | POA: Diagnosis not present

## 2013-03-29 DIAGNOSIS — N201 Calculus of ureter: Secondary | ICD-10-CM | POA: Diagnosis not present

## 2013-03-29 DIAGNOSIS — N200 Calculus of kidney: Secondary | ICD-10-CM | POA: Diagnosis not present

## 2013-03-29 DIAGNOSIS — J45909 Unspecified asthma, uncomplicated: Secondary | ICD-10-CM | POA: Diagnosis not present

## 2013-03-29 DIAGNOSIS — K589 Irritable bowel syndrome without diarrhea: Secondary | ICD-10-CM | POA: Diagnosis not present

## 2013-03-29 DIAGNOSIS — E213 Hyperparathyroidism, unspecified: Secondary | ICD-10-CM | POA: Diagnosis not present

## 2013-03-29 DIAGNOSIS — I10 Essential (primary) hypertension: Secondary | ICD-10-CM | POA: Insufficient documentation

## 2013-03-29 DIAGNOSIS — N23 Unspecified renal colic: Secondary | ICD-10-CM | POA: Insufficient documentation

## 2013-03-29 DIAGNOSIS — Z79899 Other long term (current) drug therapy: Secondary | ICD-10-CM | POA: Diagnosis not present

## 2013-03-29 HISTORY — PX: CYSTOSCOPY/RETROGRADE/URETEROSCOPY/STONE EXTRACTION WITH BASKET: SHX5317

## 2013-03-29 LAB — BASIC METABOLIC PANEL WITH GFR
BUN: 14 mg/dL (ref 6–23)
CO2: 28 meq/L (ref 19–32)
Calcium: 10.1 mg/dL (ref 8.4–10.5)
Chloride: 102 meq/L (ref 96–112)
Creatinine, Ser: 0.83 mg/dL (ref 0.50–1.10)
GFR calc Af Amer: 81 mL/min — ABNORMAL LOW
GFR calc non Af Amer: 70 mL/min — ABNORMAL LOW
Glucose, Bld: 87 mg/dL (ref 70–99)
Potassium: 3.8 meq/L (ref 3.5–5.1)
Sodium: 140 meq/L (ref 135–145)

## 2013-03-29 LAB — CBC
HCT: 45.7 % (ref 36.0–46.0)
Hemoglobin: 15.5 g/dL — ABNORMAL HIGH (ref 12.0–15.0)
MCH: 30 pg (ref 26.0–34.0)
MCHC: 33.9 g/dL (ref 30.0–36.0)
MCV: 88.4 fL (ref 78.0–100.0)
Platelets: 196 10*3/uL (ref 150–400)
RBC: 5.17 MIL/uL — ABNORMAL HIGH (ref 3.87–5.11)
RDW: 12.7 % (ref 11.5–15.5)
WBC: 8.6 10*3/uL (ref 4.0–10.5)

## 2013-03-29 SURGERY — CYSTOSCOPY, WITH CALCULUS REMOVAL USING BASKET
Anesthesia: Spinal | Site: Ureter | Laterality: Right | Wound class: Clean Contaminated

## 2013-03-29 MED ORDER — KETOROLAC TROMETHAMINE 15 MG/ML IJ SOLN
INTRAMUSCULAR | Status: AC
  Administered 2013-03-29: 15 mg
  Filled 2013-03-29: qty 1

## 2013-03-29 MED ORDER — SODIUM CHLORIDE 0.9 % IV SOLN: 250.0000 mL | INTRAVENOUS | Status: DC | PRN

## 2013-03-29 MED ORDER — DOXYCYCLINE HYCLATE 100 MG PO TABS: 100.0000 mg | ORAL_TABLET | Freq: Every day | ORAL | Status: DC

## 2013-03-29 MED ORDER — HYDROMORPHONE HCL PF 1 MG/ML IJ SOLN: 0.5000 mg | INTRAMUSCULAR | Status: DC | PRN

## 2013-03-29 MED ORDER — ACETAMINOPHEN 325 MG PO TABS: 650.0000 mg | ORAL_TABLET | ORAL | Status: DC | PRN

## 2013-03-29 MED ORDER — BELLADONNA ALKALOIDS-OPIUM 16.2-60 MG RE SUPP
1.0000 | Freq: Four times a day (QID) | RECTAL | Status: DC | PRN
Start: 2013-03-29 — End: 2013-03-30

## 2013-03-29 MED ORDER — PROMETHAZINE HCL 25 MG/ML IJ SOLN
6.2500 mg | INTRAMUSCULAR | Status: DC | PRN
  Administered 2013-03-29: 6.25 mg via INTRAVENOUS

## 2013-03-29 MED ORDER — AZELASTINE-FLUTICASONE 137-50 MCG/ACT NA SUSP: 1.0000 | Freq: Every day | NASAL | Status: DC

## 2013-03-29 MED ORDER — PROMETHAZINE HCL 25 MG/ML IJ SOLN: 6.2500 mg | Freq: Four times a day (QID) | INTRAMUSCULAR | Status: DC | PRN

## 2013-03-29 MED ORDER — CIPROFLOXACIN IN D5W 400 MG/200ML IV SOLN
INTRAVENOUS | Status: AC
  Filled 2013-03-29: qty 200

## 2013-03-29 MED ORDER — ONDANSETRON HCL 4 MG/2ML IJ SOLN: 4.0000 mg | INTRAMUSCULAR | Status: DC | PRN

## 2013-03-29 MED ORDER — ZOLPIDEM TARTRATE 5 MG PO TABS: 5.0000 mg | ORAL_TABLET | Freq: Every evening | ORAL | Status: DC | PRN

## 2013-03-29 MED ORDER — OXYCODONE HCL 5 MG PO TABS
5.0000 mg | ORAL_TABLET | Freq: Once | ORAL | Status: AC | PRN
  Administered 2013-03-29: 5 mg via ORAL

## 2013-03-29 MED ORDER — ACETAMINOPHEN 650 MG RE SUPP
650.0000 mg | RECTAL | Status: DC | PRN
  Filled 2013-03-29: qty 1

## 2013-03-29 MED ORDER — KETOROLAC TROMETHAMINE 15 MG/ML IJ SOLN
15.0000 mg | Freq: Once | INTRAMUSCULAR | Status: AC
  Administered 2013-03-29: 15 mg via INTRAVENOUS

## 2013-03-29 MED ORDER — CIPROFLOXACIN IN D5W 400 MG/200ML IV SOLN
400.0000 mg | INTRAVENOUS | Status: AC
  Administered 2013-03-29: 400 mg via INTRAVENOUS

## 2013-03-29 MED ORDER — ESTRADIOL 1 MG PO TABS
1.0000 mg | ORAL_TABLET | Freq: Every day | ORAL | Status: DC
  Administered 2013-03-30: 1 mg via ORAL
  Filled 2013-03-29: qty 1

## 2013-03-29 MED ORDER — HYDROMORPHONE HCL PF 1 MG/ML IJ SOLN: 0.2500 mg | INTRAMUSCULAR | Status: DC | PRN

## 2013-03-29 MED ORDER — FLUCONAZOLE 150 MG PO TABS: 150.0000 mg | ORAL_TABLET | Freq: Once | ORAL | Status: DC

## 2013-03-29 MED ORDER — LIDOCAINE HCL 2 % EX GEL
CUTANEOUS | Status: AC
  Filled 2013-03-29: qty 10

## 2013-03-29 MED ORDER — LACTATED RINGERS IV SOLN
INTRAVENOUS | Status: DC
  Administered 2013-03-29: 21:00:00 via INTRAVENOUS
  Administered 2013-03-29: 1000 mL via INTRAVENOUS

## 2013-03-29 MED ORDER — PROMETHAZINE HCL 25 MG/ML IJ SOLN
INTRAMUSCULAR | Status: AC
  Filled 2013-03-29: qty 1

## 2013-03-29 MED ORDER — OXYCODONE HCL 5 MG PO TABS
ORAL_TABLET | ORAL | Status: AC
  Filled 2013-03-29: qty 1

## 2013-03-29 MED ORDER — KCL IN DEXTROSE-NACL 20-5-0.45 MEQ/L-%-% IV SOLN
INTRAVENOUS | Status: AC
  Filled 2013-03-29: qty 1000

## 2013-03-29 MED ORDER — SODIUM CHLORIDE (HYPERTONIC) 2 % OP SOLN
1.0000 [drp] | Freq: Four times a day (QID) | OPHTHALMIC | Status: DC
  Filled 2013-03-29: qty 15

## 2013-03-29 MED ORDER — PROPOFOL INFUSION 10 MG/ML OPTIME
INTRAVENOUS | Status: DC | PRN
  Administered 2013-03-29: 100 ug/kg/min via INTRAVENOUS

## 2013-03-29 MED ORDER — LIDOCAINE HCL (PF) 0.5 % IJ SOLN
INTRAMUSCULAR | Status: DC | PRN
  Administered 2013-03-29: 60 mg via INTRAVENOUS

## 2013-03-29 MED ORDER — KCL IN DEXTROSE-NACL 20-5-0.45 MEQ/L-%-% IV SOLN
INTRAVENOUS | Status: DC
  Administered 2013-03-29: 23:00:00 via INTRAVENOUS
  Filled 2013-03-29 (×2): qty 1000

## 2013-03-29 MED ORDER — FENTANYL CITRATE 0.05 MG/ML IJ SOLN
INTRAMUSCULAR | Status: DC | PRN
  Administered 2013-03-29: 100 ug via INTRAVENOUS

## 2013-03-29 MED ORDER — ONDANSETRON HCL 4 MG/2ML IJ SOLN: 4.0000 mg | Freq: Four times a day (QID) | INTRAMUSCULAR | Status: DC | PRN

## 2013-03-29 MED ORDER — HYDROCODONE-ACETAMINOPHEN 5-325 MG PO TABS: 1.0000 | ORAL_TABLET | ORAL | Status: DC | PRN

## 2013-03-29 MED ORDER — IOHEXOL 300 MG/ML  SOLN
INTRAMUSCULAR | Status: AC
  Filled 2013-03-29: qty 1

## 2013-03-29 MED ORDER — MEPERIDINE HCL 50 MG/ML IJ SOLN: 6.2500 mg | INTRAMUSCULAR | Status: DC | PRN

## 2013-03-29 MED ORDER — OXYCODONE HCL 5 MG PO TABS
5.0000 mg | ORAL_TABLET | ORAL | Status: DC | PRN
  Administered 2013-03-29 – 2013-03-30 (×2): 5 mg via ORAL
  Administered 2013-03-30: 10 mg via ORAL
  Filled 2013-03-29 (×2): qty 2
  Filled 2013-03-29: qty 1

## 2013-03-29 MED ORDER — KETOROLAC TROMETHAMINE 15 MG/ML IJ SOLN
15.0000 mg | Freq: Four times a day (QID) | INTRAMUSCULAR | Status: DC
  Administered 2013-03-29 – 2013-03-30 (×2): 15 mg via INTRAVENOUS
  Filled 2013-03-29 (×2): qty 1

## 2013-03-29 MED ORDER — 0.9 % SODIUM CHLORIDE (POUR BTL) OPTIME
TOPICAL | Status: DC | PRN
  Administered 2013-03-29: 1000 mL

## 2013-03-29 MED ORDER — PROMETHAZINE HCL 25 MG/ML IJ SOLN: 6.2500 mg | INTRAMUSCULAR | Status: DC | PRN

## 2013-03-29 MED ORDER — OXYCODONE HCL 5 MG/5ML PO SOLN
5.0000 mg | Freq: Once | ORAL | Status: AC | PRN
  Filled 2013-03-29: qty 5

## 2013-03-29 MED ORDER — DOCUSATE SODIUM 100 MG PO CAPS
100.0000 mg | ORAL_CAPSULE | Freq: Two times a day (BID) | ORAL | Status: DC
  Administered 2013-03-30: 100 mg via ORAL
  Filled 2013-03-29 (×3): qty 1

## 2013-03-29 MED ORDER — SODIUM CHLORIDE 0.9 % IJ SOLN: 3.0000 mL | INTRAMUSCULAR | Status: DC | PRN

## 2013-03-29 MED ORDER — FENTANYL CITRATE 0.05 MG/ML IJ SOLN: 25.0000 ug | INTRAMUSCULAR | Status: DC | PRN

## 2013-03-29 MED ORDER — ACETAMINOPHEN 10 MG/ML IV SOLN
1000.0000 mg | Freq: Four times a day (QID) | INTRAVENOUS | Status: DC
  Administered 2013-03-29 – 2013-03-30 (×2): 1000 mg via INTRAVENOUS
  Filled 2013-03-29 (×3): qty 100

## 2013-03-29 MED ORDER — SODIUM CHLORIDE 0.9 % IJ SOLN: 3.0000 mL | Freq: Two times a day (BID) | INTRAMUSCULAR | Status: DC

## 2013-03-29 MED ORDER — SODIUM CHLORIDE 0.9 % IR SOLN
Status: DC | PRN
  Administered 2013-03-29: 6000 mL

## 2013-03-29 MED ORDER — HYDROCODONE-ACETAMINOPHEN 5-325 MG PO TABS: 0.5000 | ORAL_TABLET | ORAL | Status: DC | PRN

## 2013-03-29 MED ORDER — ALISKIREN FUMARATE 150 MG PO TABS
150.0000 mg | ORAL_TABLET | Freq: Every day | ORAL | Status: DC
  Administered 2013-03-30: 150 mg via ORAL
  Filled 2013-03-29: qty 1

## 2013-03-29 MED ORDER — MIDAZOLAM HCL 5 MG/5ML IJ SOLN
INTRAMUSCULAR | Status: DC | PRN
  Administered 2013-03-29 (×2): 1 mg via INTRAVENOUS

## 2013-03-29 MED ORDER — ONDANSETRON HCL 4 MG/2ML IJ SOLN
INTRAMUSCULAR | Status: DC | PRN
  Administered 2013-03-29: 4 mg via INTRAVENOUS

## 2013-03-29 MED ORDER — BELLADONNA ALKALOIDS-OPIUM 16.2-60 MG RE SUPP
RECTAL | Status: AC
  Filled 2013-03-29: qty 1

## 2013-03-29 MED ORDER — DESIPRAMINE HCL 25 MG PO TABS
25.0000 mg | ORAL_TABLET | Freq: Every day | ORAL | Status: DC
  Administered 2013-03-30: 25 mg via ORAL
  Filled 2013-03-29: qty 1

## 2013-03-29 MED ORDER — ACETAMINOPHEN 10 MG/ML IV SOLN
1000.0000 mg | Freq: Once | INTRAVENOUS | Status: AC | PRN
  Administered 2013-03-29: 1000 mg via INTRAVENOUS
  Filled 2013-03-29 (×2): qty 100

## 2013-03-29 MED ORDER — BELLADONNA ALKALOIDS-OPIUM 16.2-60 MG RE SUPP
RECTAL | Status: DC | PRN
  Administered 2013-03-29: 1 via RECTAL

## 2013-03-29 SURGICAL SUPPLY — 17 items
BAG URO CATCHER STRL LF (DRAPE) ×3 IMPLANT
BASKET LASER NITINOL 1.9FR (BASKET) ×3 IMPLANT
BASKET ZERO TIP NITINOL 2.4FR (BASKET) ×3 IMPLANT
CATH URET 5FR 28IN OPEN ENDED (CATHETERS) IMPLANT
CLOTH BEACON ORANGE TIMEOUT ST (SAFETY) ×3 IMPLANT
DRAPE CAMERA CLOSED 9X96 (DRAPES) ×3 IMPLANT
GLOVE BIOGEL PI IND STRL 7.0 (GLOVE) ×2 IMPLANT
GLOVE BIOGEL PI IND STRL 7.5 (GLOVE) ×2 IMPLANT
GLOVE BIOGEL PI INDICATOR 7.0 (GLOVE) ×1
GLOVE BIOGEL PI INDICATOR 7.5 (GLOVE) ×1
GLOVE SURG SS PI 8.0 STRL IVOR (GLOVE) ×3 IMPLANT
GOWN PREVENTION PLUS XLARGE (GOWN DISPOSABLE) ×3 IMPLANT
GOWN STRL REIN XL XLG (GOWN DISPOSABLE) ×3 IMPLANT
MANIFOLD NEPTUNE II (INSTRUMENTS) ×3 IMPLANT
MARKER SKIN DUAL TIP RULER LAB (MISCELLANEOUS) ×3 IMPLANT
PACK CYSTO (CUSTOM PROCEDURE TRAY) ×3 IMPLANT
TUBING CONNECTING 10 (TUBING) ×3 IMPLANT

## 2013-03-29 NOTE — Brief Op Note (Signed)
03/29/2013  6:17 PM  PATIENT:  Carole Binning Spraker  72 y.o. female  PRE-OPERATIVE DIAGNOSIS:  right ureteral stone  POST-OPERATIVE DIAGNOSIS:  right ureteral stone  PROCEDURE:  Procedure(s): CYSTOSCOPY//URETEROSCOPY/STONE EXTRACTION WITH BASKET (Right)  SURGEON:  Surgeon(s) and Role:    * Anner Crete, MD - Primary  PHYSICIAN ASSISTANT:   ASSISTANTS: none   ANESTHESIA:   spinal  EBL:     BLOOD ADMINISTERED:none  DRAINS: none   LOCAL MEDICATIONS USED:  NONE  SPECIMEN:  Source of Specimen:  stone from right ureter  DISPOSITION OF SPECIMEN:  to family  COUNTS:  YES  TOURNIQUET:  * No tourniquets in log *  DICTATION: .Other Dictation: Dictation Number [id number]  PLAN OF CARE: Discharge to home after PACU  PATIENT DISPOSITION:  PACU - hemodynamically stable.   Delay start of Pharmacological VTE agent (>24hrs) due to surgical blood loss or risk of bleeding: not applicable

## 2013-03-29 NOTE — Anesthesia Preprocedure Evaluation (Signed)
Anesthesia Evaluation  Patient identified by MRN, date of birth, ID band Patient awake    Reviewed: Allergy & Precautions, H&P , NPO status , Patient's Chart, lab work & pertinent test results  Airway Mallampati: II TM Distance: >3 FB Neck ROM: full    Dental no notable dental hx. (+) Teeth Intact and Dental Advisory Given,    Pulmonary neg pulmonary ROS, asthma ,  Mild cold induced breath sounds clear to auscultation  Pulmonary exam normal       Cardiovascular Exercise Tolerance: Good hypertension, negative cardio ROS  Rhythm:regular Rate:Normal     Neuro/Psych negative neurological ROS  negative psych ROS   GI/Hepatic negative GI ROS, Neg liver ROS,   Endo/Other  negative endocrine ROS  Renal/GU negative Renal ROS  negative genitourinary   Musculoskeletal   Abdominal   Peds  Hematology negative hematology ROS (+)   Anesthesia Other Findings   Reproductive/Obstetrics negative OB ROS                           Anesthesia Physical  Anesthesia Plan  ASA: II  Anesthesia Plan: Spinal   Post-op Pain Management:    Induction: Intravenous  Airway Management Planned:   Additional Equipment:   Intra-op Plan:   Post-operative Plan:   Informed Consent: I have reviewed the patients History and Physical, chart, labs and discussed the procedure including the risks, benefits and alternatives for the proposed anesthesia with the patient or authorized representative who has indicated his/her understanding and acceptance.   Dental Advisory Given  Plan Discussed with: CRNA and Surgeon  Anesthesia Plan Comments:         Anesthesia Quick Evaluation

## 2013-03-29 NOTE — H&P (Signed)
ctive Problems Problems  1. Dysuria 788.1 2. Microscopic Hematuria 599.72 3. Nephrolithiasis Bilateral 592.0 4. Pyuria 791.9 5. Renal Colic Left 788.0 6. Ureteral Stone Right 592.1 7. Urinary Frequency 788.41  History of Present Illness     Ashley [name] returns today with persistent symptoms of her 4-67mm right ureteral stone that she has been dealing with for the last couple of weeks.  She has no pain and has just irritative symptoms.  She is interested in proceeding with therapy.   Past Medical History Problems  1. History of  Constipation 564.00 2. History of  Depression 311 3. History of  Diffuse Abdominal Pain 789.07 4. History of  Distal Ureteral Stone On The Left 592.1 5. History of  Distal Ureteral Stone On The Right 592.1 6. History of  Hydronephrosis On The Left 591 7. History of  Lumbar Disc Degeneration 722.52 8. Nephrolithiasis Bilateral 592.0 9. History of  Proximal Ureteral Stone On The Right 592.1 10. History of  Spinal Stenosis 724.00 11. History of  Stroke Syndrome 436 12. History of  Urinary Tract Infection V13.02  Surgical History Problems  1. History of  Cholecystectomy 2. History of  Cystoscopy With Ureteroscopy With Lithotripsy 3. History of  Cystoscopy With Ureteroscopy With Lithotripsy 4. History of  Cystoscopy With Ureteroscopy With Lithotripsy 5. History of  Cystoscopy With Ureteroscopy With Removal Of Calculus 6. History of  Cystoscopy With Ureteroscopy With Removal Of Calculus 7. History of  Cystoscopy With Ureteroscopy With Removal Of Calculus 8. History of  Cystoscopy With Ureteroscopy With Removal Of Calculus 9. History of  Foot Surgery 10. History of  Hysterectomy 11. History of  Inguinal Hernia Repair 12. History of  Kidney Surgery 13. History of  Lithotripsy 14. History of  Lithotripsy 15. History of  Lower Back Surgery 16. History of  Tonsillectomy 17. History of  Ureterolithotomy  Current Meds 1. CeleBREX CAPS; Therapy:  (Recorded:03Oct2011) to 2. Crestor 5 MG Oral Tablet; Therapy: 23Jan2014 to 3. Desipramine HCl 25 MG Oral Tablet; Therapy: (Recorded:08Sep2008) to 4. Doxycycline Hyclate TABS; Therapy: (Recorded:07Jul2014) to 5. Dymista 137-50 MCG/ACT Nasal Suspension; Therapy: 04Nov2013 to 6. Estradiol TABS; Therapy: (Recorded:08Sep2008) to 7. Fish Oil CAPS; Therapy: (Recorded:08Sep2008) to 8. Fluconazole 150 MG Oral Tablet; Therapy: 18Sep2013 to 9. Hydrocodone-Acetaminophen 5-325 MG Oral Tablet; take  1-2 tab po q 4 hours prn pain; Therapy:  13Aug2010 to (Last Rx:21Feb2011) 10. HYDROmorphone HCl 4 MG Oral Tablet; 1 - 2 tabs q 4 - 6 Hrs. PRN pain; Therapy: 01May2014 to   (Last Rx:01May2014) 11. Irbesartan 150 MG Oral Tablet; Therapy: 24Dec2013 to 12. Mefloquine HCl 250 MG Oral Tablet; Therapy: 15Apr2014 to 13. Multi-Vitamin Oral Tablet; with out iron; Therapy: (Recorded:08Sep2008) to 14. Ondansetron 4 MG Oral Tablet Dispersible; TAKE 1 TABLET Every 4 hours PRN nausea or   vomiting; Therapy: 01May2014 to (Last Rx:01May2014)  Requested for: 01May2014 15. Ondansetron 8 MG Oral Tablet Dispersible; Therapy: 15Apr2014 to 16. Rizatriptan Benzoate 10 MG Oral Tablet Dispersible; Therapy: 22May2013 to 17. Sprix 15.75 MG/SPRAY Nasal Solution; 1spray in 1 nostril q6-8 hours; maximum for sprays/day;   Therapy: 02May2014 to (Last Rx:02May2014)  Requested for: 02May2014 18. Tekturna TABS; Therapy: (Recorded:05Mar2009) to 19. Uribel 118 MG Oral Capsule; take 1 capsule   4  times a day prn; Therapy: 14May2014 to (Last   Rx:14May2014) 20. Uribel 118 MG Oral Capsule; TAKE 1 CAPSULE 4 times daily; Therapy: 07Jul2014 to (Last   Rx:07Jul2014) 21. Zolpidem Tartrate 5 MG Oral Tablet; Therapy: 04Apr2013 to  Allergies Medication  1.  No Known Drug Allergies  Family History Problems  1. Maternal history of  Chronic Obstructive Pulmonary Disease 2. Maternal history of  Congestive Heart Failure  Social History Problems  1.  Alcohol Use Social 2. Former Smoker Quit around [employer]. 3. Marital History - Currently Married 4. Occupation: IT consultant  Review of Systems  Genitourinary: urinary urgency.  Gastrointestinal: nausea, but no flank pain.  Constitutional: no fever.    Vitals Vital Signs [Data Includes: Last 1 Day]  14Jul2014 08:22AM  Blood Pressure: 156 / 89 Temperature: 97.9 F Heart Rate: 86  Physical Exam Constitutional: Well nourished and well developed . No acute distress.  Pulmonary: No respiratory distress and normal respiratory rhythm and effort.  Cardiovascular: Heart rate and rhythm are normal . No peripheral edema.    Results/Data  Urine [Data Includes: Last 1 Day]   14Jul2014  COLOR YELLOW   APPEARANCE CLOUDY   SPECIFIC GRAVITY 1.015   pH 6.0   GLUCOSE NEG mg/dL  BILIRUBIN NEG   KETONE NEG mg/dL  BLOOD MOD   PROTEIN TRACE mg/dL  UROBILINOGEN 0.2 mg/dL  NITRITE NEG   LEUKOCYTE ESTERASE SMALL   SQUAMOUS EPITHELIAL/HPF FEW   WBC 11-20 WBC/hpf  RBC 7-10 RBC/hpf  BACTERIA FEW   CRYSTALS Calcium Oxalate crystals noted   CASTS NONE SEEN    The following images/tracing/specimen were independently visualized:  KUB today shows no change in the RUVJ stone and stable renal stones. No other changes are noted.  29 Mar 2013 8:12 AM   UA With REFLEX       COLOR YELLOW       APPEARANCE CLOUDY       SPECIFIC GRAVITY 1.015       pH 6.0       GLUCOSE NEG       BILIRUBIN NEG       KETONE NEG       BLOOD MOD       PROTEIN TRACE       UROBILINOGEN 0.2       NITRITE NEG       LEUKOCYTE ESTERASE SMALL       SQUAMOUS EPITHELIAL/HPF FEW       WBC 11-20       CRYSTALS Calcium Oxalate crystals noted       CASTS NONE SEEN       RBC 7-10       BACTERIA FEW      Assessment Assessed  1. Ureteral Stone Right 592.1   She has non-progression of the right distal stone and would like ureteroscopy. She also has a history of hyperparathyroidism that I wasn't aware of until  recently.   Plan  I will set her up for ureteroscopy for later today.  The risks were reviewed again.  I discussed dietary management of the stones but informed her that the most important thing she needs to do is to follow through with management of the hyperparathyroidism.

## 2013-03-29 NOTE — Transfer of Care (Signed)
Immediate Anesthesia Transfer of Care Note  Patient: Ashley [name]  Procedure(s) Performed: Procedure(s): CYSTOSCOPY//URETEROSCOPY/STONE EXTRACTION WITH BASKET (Right)  Patient Location: PACU  Anesthesia Type:General  Level of Consciousness: awake, alert  and oriented  Airway & Oxygen Therapy: Patient Spontanous Breathing and Patient connected to face mask oxygen  Post-op Assessment: Report given to PACU RN and Post -op Vital signs reviewed and stable  Post vital signs: Reviewed and stable  Complications: No apparent anesthesia complications

## 2013-03-29 NOTE — Progress Notes (Signed)
PACU Nursing Note: pt still unable to void, cont to c/o Rt side/ flank pain, pt refuses any further medication, MD spoke to patient of plans to admit for overnight, pt wanting to ambulate to BR, but gait very unsteady, demanding to go to bathroom, explained safety concerns, offered bedside commode, awaiting room assignment

## 2013-03-29 NOTE — Anesthesia Postprocedure Evaluation (Signed)
Anesthesia Post Note  Patient: Ashley [name]  Procedure(s) Performed: Procedure(s) (LRB): CYSTOSCOPY//URETEROSCOPY/STONE EXTRACTION WITH BASKET (Right)  Anesthesia type: Spinal  Patient location: PACU  Post pain: Pain level controlled  Post assessment: Post-op Vital signs reviewed  Last Vitals: BP 134/73  Pulse 83  Temp(Src) 36.6 C (Oral)  Resp 20  Ht 5' 1.5" (1.562 m)  Wt 152 lb 2 oz (69.003 kg)  BMI 28.28 kg/m2  SpO2 100%  Post vital signs: Reviewed  Level of consciousness: sedated  Complications: No apparent anesthesia complications

## 2013-03-29 NOTE — Anesthesia Procedure Notes (Signed)
Spinal  Patient location during procedure: OR Staffing CRNA/Resident: Lerline Valdivia Performed by: anesthesiologist and resident/CRNA  Preanesthetic Checklist Completed: patient identified, site marked, surgical consent, pre-op evaluation, timeout performed, IV checked, risks and benefits discussed and monitors and equipment checked Spinal Block Patient position: sitting Prep: Betadine Patient monitoring: heart rate, continuous pulse ox and blood pressure Location: L3-4 Injection technique: single-shot Needle Needle type: Sprotte  Needle gauge: 24 G Needle length: 9 cm Assessment Sensory level: T6 Additional Notes Expiration date of kit checked and confirmed. Patient tolerated procedure well, without complications.

## 2013-03-30 ENCOUNTER — Observation Stay (HOSPITAL_COMMUNITY): Payer: Medicare Other

## 2013-03-30 ENCOUNTER — Encounter (HOSPITAL_COMMUNITY): Payer: Self-pay

## 2013-03-30 DIAGNOSIS — N200 Calculus of kidney: Secondary | ICD-10-CM | POA: Diagnosis not present

## 2013-03-30 DIAGNOSIS — N201 Calculus of ureter: Secondary | ICD-10-CM | POA: Diagnosis not present

## 2013-03-30 DIAGNOSIS — N1330 Unspecified hydronephrosis: Secondary | ICD-10-CM | POA: Diagnosis not present

## 2013-03-30 NOTE — Discharge Summary (Signed)
Physician Discharge Summary  Patient ID: Ashley [name] MRN: [id number] DOB/AGE: January 07, 1941 72 y.o.  Admit date: 03/29/2013 Discharge date: 03/30/2013  Admission Diagnoses: Right UVJ stone  Discharge Diagnoses: Same Active Problems:  Right UVJ stone. Post op right ureteral edema with colic.    Discharged Condition: good  Hospital Course: Ashley [name] had an uneventful right ureterscopic stone extraction without stent but had post op colic requiring admission.  KUB this AM showed stable renal stones with no new ureteral stones and the US showed mild hydro with meatal edema and stable renal stones.  Her pain has resolved.   Consults: None  Significant Diagnostic Studies: KUB and Renal US.   Treatments: surgery: right ureteroscopic stone extraction.   Discharge Exam: Blood pressure 132/61, pulse 67, temperature 97.7 F (36.5 C), temperature source Oral, resp. rate 16, height 5' 1.5" (1.562 m), weight 71.442 kg (157 lb 8 oz), SpO2 99.00%. General appearance: alert and no distress  Disposition: 01-Home or Self Care     Medication List         aliskiren 150 MG tablet  Commonly known as:  TEKTURNA  Take 150 mg by mouth daily.     Azelastine-Fluticasone 137-50 MCG/ACT Susp  Place 1-2 puffs into the nose at bedtime.     celecoxib 200 MG capsule  Commonly known as:  CELEBREX  Take 200 mg by mouth daily as needed. For back pain     desipramine 25 MG tablet  Commonly known as:  NORPRAMIN  Take 25 mg by mouth daily.     doxycycline 100 MG tablet  Commonly known as:  VIBRA-TABS  Take 1 tablet (100 mg total) by mouth daily. For malaria prophylaxis     estradiol 1 MG tablet  Commonly known as:  ESTRACE  Take 1 mg by mouth daily.     fish oil-omega-3 fatty acids 1000 MG capsule  Take 2 g by mouth daily.     fluconazole 150 MG tablet  Commonly known as:  DIFLUCAN  Take 1 tablet (150 mg total) by mouth once.     HYDROcodone-acetaminophen 5-325 MG per tablet  Commonly known  as:  NORCO/VICODIN  Take 1 tablet by mouth every 4 (four) hours as needed for pain. For pain     MULTIVITAMIN PO  Take by mouth daily.     predniSONE 10 MG tablet  Commonly known as:  DELTASONE  Take 4 tabs x 2 days, 3 tabs x 2 days, 2 tabs x 2 days, then 1 tab x 2 days then stop     sodium chloride 2 % ophthalmic solution  Commonly known as:  MURO 128  Place 1 drop into the right eye 4 (four) times daily.     STOOL SOFTENER PO  Take by mouth as needed (constipation).           Follow-up Information   Call Anner Crete, MD. (for a f/u in 1-2 weeks. )    Contact information:   [address] [place] [address] [phone]       Signed: Anner Crete 03/30/2013, 9:53 AM

## 2013-03-30 NOTE — Care Management Note (Signed)
    Page 1 of 1   03/30/2013     11:05:26 AM   CARE MANAGEMENT NOTE 03/30/2013  Patient:  Ashley [name], Ashley [name]   Account Number:  [id number]  Date Initiated:  03/30/2013  Documentation initiated by:  Lanier Clam  Subjective/Objective Assessment:   ADMITTED W/R URETERAL STONE.     Action/Plan:   FROM HOME.   Anticipated DC Date:  03/30/2013   Anticipated DC Plan:  HOME/SELF CARE      DC Planning Services  CM consult      Choice offered to / List presented to:             Status of service:  Completed, signed off Medicare Important Message given?   (If response is "NO", the following Medicare IM given date fields will be blank) Date Medicare IM given:   Date Additional Medicare IM given:    Discharge Disposition:  HOME/SELF CARE  Per UR Regulation:  Reviewed for med. necessity/level of care/duration of stay  If discussed at Long Length of Stay Meetings, dates discussed:    Comments:  03/30/13 Ebunoluwa Gernert RN,BSN NCM [phone] S/P CYSTOSCOPY.D/C NO NEEDS OR ORDERS.

## 2013-03-30 NOTE — Op Note (Signed)
NAMEYAZMYN, Ashley [name] NO.:  [id number]  MEDICAL RECORD NO.:  [id number]  LOCATION:  1410                         FACILITY:  [facility]  PHYSICIAN:  Excell Seltzer. Annabell Howells, M.D.    DATE OF BIRTH:  February 24, 1941  DATE OF PROCEDURE:  03/29/2013 DATE OF DISCHARGE:                              OPERATIVE REPORT   PROCEDURE:  Cystoscopy with right ureteroscopic stone extraction.  PREOPERATIVE DIAGNOSIS:  Right Ureterovesical junction stone.  POSTOPERATIVE DIAGNOSIS:  Right Ureterovesical junction stone.  SURGEON:  Excell Seltzer. Annabell Howells, M.D.  ANESTHESIA:  General.  SPECIMEN:  Stone.  DRAINS:  None.  COMPLICATIONS:  None.  INDICATIONS:  Ashley [name] is a 73 year old white female with a history of stones, who has a symptomatic 4-5 mm right UVJ stone and has elected ureteroscopic stone extraction.  FINDINGS OF PROCEDURE:  She was given Cipro.  She was taken to the operating room where spinal anesthetic was induced.  She was placed in lithotomy position and fitted with PAS hose.  Her perineum and genitalia were prepped with Betadine solution.  She was draped in usual sterile fashion.  A 6.4-French short ureteroscope was prepared and passed per urethra. The right ureteral orifice was identified and it was easily cannulated with the scope.  A 4-5 mm stone was noted just above the UVJ.  The ureteroscope was passed above the stone and dilate the ureter.  A Nitinol basket was then used to grasp the stone and removed it. Initially, the stone disengage, the second attempt was made at this time successfully.  Reinspection of the ureter after stone removal revealed minimal mucosal irritation with some oozing, but nothing that would require stent.  At this point, the cystoscope was inserted, the bladder was inspected. The bladder wall was smooth and pale without tumor, stones, or inflammation.  The left ureteral orifice was unremarkable and evidence of recent instrumentation.  The bladder was then  drained.  The cystoscope was removed.  A B and O suppository was placed.  She was given 15 mg of Toradol and 4 mg of Zofran.  She was taken down from lithotomy position and moved to the recovery room in stable condition.  There were no complications.     Excell Seltzer. Annabell Howells, M.D.     JJW/MEDQ  D:  03/29/2013  T:  03/30/2013  Job:  [id number]

## 2013-04-12 DIAGNOSIS — N200 Calculus of kidney: Secondary | ICD-10-CM | POA: Diagnosis not present

## 2013-04-14 ENCOUNTER — Telehealth: Payer: Self-pay | Admitting: Internal Medicine

## 2013-04-14 NOTE — Telephone Encounter (Signed)
Called patient x3 and lm for return appointment. No return call back. Sent letter 04/14/13. °

## 2013-05-06 DIAGNOSIS — M25579 Pain in unspecified ankle and joints of unspecified foot: Secondary | ICD-10-CM | POA: Diagnosis not present

## 2013-05-06 DIAGNOSIS — S93419A Sprain of calcaneofibular ligament of unspecified ankle, initial encounter: Secondary | ICD-10-CM | POA: Diagnosis not present

## 2013-05-06 DIAGNOSIS — H40019 Open angle with borderline findings, low risk, unspecified eye: Secondary | ICD-10-CM | POA: Diagnosis not present

## 2013-05-12 DIAGNOSIS — H409 Unspecified glaucoma: Secondary | ICD-10-CM | POA: Diagnosis not present

## 2013-05-12 DIAGNOSIS — H4011X0 Primary open-angle glaucoma, stage unspecified: Secondary | ICD-10-CM | POA: Diagnosis not present

## 2013-05-31 ENCOUNTER — Other Ambulatory Visit: Payer: Self-pay | Admitting: Dermatology

## 2013-05-31 DIAGNOSIS — Z8673 Personal history of transient ischemic attack (TIA), and cerebral infarction without residual deficits: Secondary | ICD-10-CM | POA: Diagnosis not present

## 2013-05-31 DIAGNOSIS — E559 Vitamin D deficiency, unspecified: Secondary | ICD-10-CM | POA: Diagnosis not present

## 2013-05-31 DIAGNOSIS — R809 Proteinuria, unspecified: Secondary | ICD-10-CM | POA: Diagnosis not present

## 2013-05-31 DIAGNOSIS — L719 Rosacea, unspecified: Secondary | ICD-10-CM | POA: Diagnosis not present

## 2013-05-31 DIAGNOSIS — C44711 Basal cell carcinoma of skin of unspecified lower limb, including hip: Secondary | ICD-10-CM | POA: Diagnosis not present

## 2013-05-31 DIAGNOSIS — E213 Hyperparathyroidism, unspecified: Secondary | ICD-10-CM | POA: Diagnosis not present

## 2013-05-31 DIAGNOSIS — D235 Other benign neoplasm of skin of trunk: Secondary | ICD-10-CM | POA: Diagnosis not present

## 2013-05-31 DIAGNOSIS — Z23 Encounter for immunization: Secondary | ICD-10-CM | POA: Diagnosis not present

## 2013-05-31 DIAGNOSIS — Z0000 Encounter for general adult medical examination without abnormal findings: Secondary | ICD-10-CM | POA: Diagnosis not present

## 2013-05-31 DIAGNOSIS — D239 Other benign neoplasm of skin, unspecified: Secondary | ICD-10-CM | POA: Diagnosis not present

## 2013-05-31 DIAGNOSIS — M4310 Spondylolisthesis, site unspecified: Secondary | ICD-10-CM | POA: Diagnosis not present

## 2013-05-31 DIAGNOSIS — F329 Major depressive disorder, single episode, unspecified: Secondary | ICD-10-CM | POA: Diagnosis not present

## 2013-05-31 DIAGNOSIS — Z85828 Personal history of other malignant neoplasm of skin: Secondary | ICD-10-CM | POA: Diagnosis not present

## 2013-05-31 DIAGNOSIS — D2370 Other benign neoplasm of skin of unspecified lower limb, including hip: Secondary | ICD-10-CM | POA: Diagnosis not present

## 2013-05-31 DIAGNOSIS — E7800 Pure hypercholesterolemia, unspecified: Secondary | ICD-10-CM | POA: Diagnosis not present

## 2013-05-31 DIAGNOSIS — L570 Actinic keratosis: Secondary | ICD-10-CM | POA: Diagnosis not present

## 2013-05-31 DIAGNOSIS — Z1331 Encounter for screening for depression: Secondary | ICD-10-CM | POA: Diagnosis not present

## 2013-05-31 DIAGNOSIS — C4359 Malignant melanoma of other part of trunk: Secondary | ICD-10-CM | POA: Diagnosis not present

## 2013-05-31 DIAGNOSIS — I10 Essential (primary) hypertension: Secondary | ICD-10-CM | POA: Diagnosis not present

## 2013-05-31 DIAGNOSIS — D485 Neoplasm of uncertain behavior of skin: Secondary | ICD-10-CM | POA: Diagnosis not present

## 2013-06-03 DIAGNOSIS — H2510 Age-related nuclear cataract, unspecified eye: Secondary | ICD-10-CM | POA: Diagnosis not present

## 2013-06-03 DIAGNOSIS — H4011X0 Primary open-angle glaucoma, stage unspecified: Secondary | ICD-10-CM | POA: Diagnosis not present

## 2013-06-03 DIAGNOSIS — H409 Unspecified glaucoma: Secondary | ICD-10-CM | POA: Diagnosis not present

## 2013-06-15 ENCOUNTER — Other Ambulatory Visit: Payer: Self-pay | Admitting: Dermatology

## 2013-06-15 DIAGNOSIS — I129 Hypertensive chronic kidney disease with stage 1 through stage 4 chronic kidney disease, or unspecified chronic kidney disease: Secondary | ICD-10-CM | POA: Diagnosis not present

## 2013-06-15 DIAGNOSIS — Z85828 Personal history of other malignant neoplasm of skin: Secondary | ICD-10-CM | POA: Diagnosis not present

## 2013-06-15 DIAGNOSIS — M1990 Unspecified osteoarthritis, unspecified site: Secondary | ICD-10-CM | POA: Diagnosis not present

## 2013-06-15 DIAGNOSIS — G501 Atypical facial pain: Secondary | ICD-10-CM | POA: Diagnosis not present

## 2013-06-15 DIAGNOSIS — Z8673 Personal history of transient ischemic attack (TIA), and cerebral infarction without residual deficits: Secondary | ICD-10-CM | POA: Diagnosis not present

## 2013-06-15 DIAGNOSIS — C4359 Malignant melanoma of other part of trunk: Secondary | ICD-10-CM | POA: Diagnosis not present

## 2013-06-30 DIAGNOSIS — R350 Frequency of micturition: Secondary | ICD-10-CM | POA: Diagnosis not present

## 2013-06-30 DIAGNOSIS — R3129 Other microscopic hematuria: Secondary | ICD-10-CM | POA: Diagnosis not present

## 2013-07-13 ENCOUNTER — Other Ambulatory Visit: Payer: Self-pay | Admitting: Family Medicine

## 2013-07-13 DIAGNOSIS — G501 Atypical facial pain: Secondary | ICD-10-CM | POA: Diagnosis not present

## 2013-07-13 DIAGNOSIS — R6884 Jaw pain: Secondary | ICD-10-CM | POA: Diagnosis not present

## 2013-07-13 DIAGNOSIS — N644 Mastodynia: Secondary | ICD-10-CM

## 2013-07-13 DIAGNOSIS — G43909 Migraine, unspecified, not intractable, without status migrainosus: Secondary | ICD-10-CM | POA: Diagnosis not present

## 2013-07-19 DIAGNOSIS — N200 Calculus of kidney: Secondary | ICD-10-CM | POA: Diagnosis not present

## 2013-07-29 ENCOUNTER — Ambulatory Visit
Admission: RE | Admit: 2013-07-29 | Discharge: 2013-07-29 | Disposition: A | Discharge status: Home / Self Care | Payer: Medicare Other | Source: Ambulatory Visit | Attending: Family Medicine | Admitting: Family Medicine

## 2013-07-29 ENCOUNTER — Other Ambulatory Visit: Payer: Self-pay | Admitting: Family Medicine

## 2013-07-29 DIAGNOSIS — N6459 Other signs and symptoms in breast: Secondary | ICD-10-CM | POA: Diagnosis not present

## 2013-07-29 DIAGNOSIS — N644 Mastodynia: Secondary | ICD-10-CM

## 2013-08-06 DIAGNOSIS — M546 Pain in thoracic spine: Secondary | ICD-10-CM | POA: Diagnosis not present

## 2013-08-06 DIAGNOSIS — M542 Cervicalgia: Secondary | ICD-10-CM | POA: Diagnosis not present

## 2013-08-11 DIAGNOSIS — N200 Calculus of kidney: Secondary | ICD-10-CM | POA: Diagnosis not present

## 2013-08-19 DIAGNOSIS — N201 Calculus of ureter: Secondary | ICD-10-CM | POA: Diagnosis not present

## 2013-08-19 DIAGNOSIS — N200 Calculus of kidney: Secondary | ICD-10-CM | POA: Diagnosis not present

## 2013-09-24 DIAGNOSIS — K5900 Constipation, unspecified: Secondary | ICD-10-CM | POA: Diagnosis not present

## 2013-09-24 DIAGNOSIS — S058X9A Other injuries of unspecified eye and orbit, initial encounter: Secondary | ICD-10-CM | POA: Diagnosis not present

## 2013-09-24 DIAGNOSIS — R109 Unspecified abdominal pain: Secondary | ICD-10-CM | POA: Diagnosis not present

## 2013-09-24 DIAGNOSIS — H5710 Ocular pain, unspecified eye: Secondary | ICD-10-CM | POA: Diagnosis not present

## 2013-09-24 DIAGNOSIS — B0052 Herpesviral keratitis: Secondary | ICD-10-CM | POA: Diagnosis not present

## 2013-09-27 ENCOUNTER — Other Ambulatory Visit: Payer: Self-pay | Admitting: Dermatology

## 2013-09-27 DIAGNOSIS — L739 Follicular disorder, unspecified: Secondary | ICD-10-CM | POA: Diagnosis not present

## 2013-09-27 DIAGNOSIS — Z85820 Personal history of malignant melanoma of skin: Secondary | ICD-10-CM | POA: Diagnosis not present

## 2013-09-27 DIAGNOSIS — L570 Actinic keratosis: Secondary | ICD-10-CM | POA: Diagnosis not present

## 2013-09-27 DIAGNOSIS — D239 Other benign neoplasm of skin, unspecified: Secondary | ICD-10-CM | POA: Diagnosis not present

## 2013-09-27 DIAGNOSIS — L738 Other specified follicular disorders: Secondary | ICD-10-CM | POA: Diagnosis not present

## 2013-09-27 DIAGNOSIS — L821 Other seborrheic keratosis: Secondary | ICD-10-CM | POA: Diagnosis not present

## 2013-09-27 DIAGNOSIS — L708 Other acne: Secondary | ICD-10-CM | POA: Diagnosis not present

## 2013-09-27 DIAGNOSIS — D235 Other benign neoplasm of skin of trunk: Secondary | ICD-10-CM | POA: Diagnosis not present

## 2013-09-27 DIAGNOSIS — Z85828 Personal history of other malignant neoplasm of skin: Secondary | ICD-10-CM | POA: Diagnosis not present

## 2013-09-27 DIAGNOSIS — D485 Neoplasm of uncertain behavior of skin: Secondary | ICD-10-CM | POA: Diagnosis not present

## 2013-09-28 DIAGNOSIS — H18519 Endothelial corneal dystrophy, unspecified eye: Secondary | ICD-10-CM | POA: Diagnosis not present

## 2013-09-28 DIAGNOSIS — B0052 Herpesviral keratitis: Secondary | ICD-10-CM | POA: Diagnosis not present

## 2013-10-08 DIAGNOSIS — H2510 Age-related nuclear cataract, unspecified eye: Secondary | ICD-10-CM | POA: Diagnosis not present

## 2013-10-08 DIAGNOSIS — H40019 Open angle with borderline findings, low risk, unspecified eye: Secondary | ICD-10-CM | POA: Diagnosis not present

## 2013-10-11 DIAGNOSIS — N200 Calculus of kidney: Secondary | ICD-10-CM | POA: Diagnosis not present

## 2013-10-11 DIAGNOSIS — R300 Dysuria: Secondary | ICD-10-CM | POA: Diagnosis not present

## 2013-10-11 DIAGNOSIS — R350 Frequency of micturition: Secondary | ICD-10-CM | POA: Diagnosis not present

## 2013-10-12 ENCOUNTER — Other Ambulatory Visit: Payer: Self-pay | Admitting: Urology

## 2013-10-12 ENCOUNTER — Encounter (HOSPITAL_BASED_OUTPATIENT_CLINIC_OR_DEPARTMENT_OTHER): Payer: Self-pay | Admitting: *Deleted

## 2013-10-12 NOTE — Progress Notes (Signed)
NPO AFTER MN. ARRIVE AT 0930. NEEDS ISTAT, KUB, AND EKG. MAY TAKE HYDROCODONE IF NEEDED W/ SIPS OF WATER AM DOS.

## 2013-10-13 DIAGNOSIS — M19019 Primary osteoarthritis, unspecified shoulder: Secondary | ICD-10-CM | POA: Diagnosis not present

## 2013-10-13 DIAGNOSIS — M719 Bursopathy, unspecified: Secondary | ICD-10-CM | POA: Diagnosis not present

## 2013-10-13 DIAGNOSIS — M67919 Unspecified disorder of synovium and tendon, unspecified shoulder: Secondary | ICD-10-CM | POA: Diagnosis not present

## 2013-10-13 NOTE — H&P (Signed)
eason For Visit      Ms. Tozer presents today with ongoing bladder pressure   History of Present Illness   Ms. Pardee has a long history of nephrolithiasis.  She has passed many stones and has had many stone procedures.  She had a ureteroscopic stone extraction on 04/12/2013.  She has passed several stones since this time.  Interval history:  Ms. Sliva presents today with ongoing bladder pressure.  It has been present intermittently since before Christmas.  She has long periods of time where she has no symptoms and thus thinks she has passed the stone she believes is causing this pressure.  Then the pressure will return.  Nothing makes the pressure better.  It can begin at any time.  Intercourse makes it worse. She really denies any pain associated with the pressure, she is just uncomfortable.  She denies other LUTS, dysuria, hematuria, n/v, or f/c.   Past Medical History Problems   1. History of Calculus of distal right ureter (592.1)  2. History of Calculus of left ureter (592.1)  3. History of Calculus of right ureter (592.1)  4. 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 History of Generalized abdominal pain (789.07)  6. History of constipation (V12.79)  7. History of depression (V11.8)  8. History of Hydronephrosis, left (591)  9. Nephrolithiasis (592.0)  10. History of Spinal stenosis (724.00)  11. 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 History of Urinary Tract Infection (V13.02)  Surgical History Problems   1. History of Cholecystectomy  2. History of Cystoscopy With Ureteroscopy With Lithotripsy  3. History of Cystoscopy With Ureteroscopy With Lithotripsy  4. History of Cystoscopy With Ureteroscopy With Lithotripsy  5. History of Cystoscopy With Ureteroscopy With Removal Of Calculus  6. History of Cystoscopy With Ureteroscopy With Removal Of Calculus  7. History of Cystoscopy With Ureteroscopy With Removal Of Calculus  8. History of Cystoscopy With  Ureteroscopy With Removal Of Calculus  9. History of Cystoscopy With Ureteroscopy With Removal Of Calculus  10. History of Foot Surgery  11. History of Hysterectomy  12. History of Inguinal Hernia Repair  13. History of Kidney Surgery  14. History of Lithotripsy  15. History of Lithotripsy  16. History of Lower Back Surgery  17. History of Tonsillectomy  18. History of Ureterolithotomy  Current Meds  1. Alphagan P 0.1 % Ophthalmic Solution;  Therapy: 22Aug2014 to Recorded  2. Combigan 0.2-0.5 % Ophthalmic Solution;  Therapy: 21Oct2014 to Recorded  3. Crestor 5 MG Oral Tablet;  Therapy: 23Jan2014 to Recorded  4. Desipramine HCl - 25 MG Oral Tablet;  Therapy: (Recorded:08Sep2008) to Recorded  5. Doxycycline Hyclate TABS;  Therapy: (Recorded:07Jul2014) to Recorded  6. Dymista 137-50 MCG/ACT Nasal Suspension;  Therapy: [id number] to Recorded  7. Erythromycin 5 MG/GM Ophthalmic Ointment;  Therapy: 28UXL2440 to Recorded  8. Estradiol TABS;  Therapy: (Recorded:08Sep2008) to Recorded  9. Fish Oil CAPS;  Therapy: (Recorded:08Sep2008) to Recorded  10. Fluconazole 150 MG Oral Tablet;   Therapy: 18Sep2013 to Recorded  11. Gabapentin 100 MG Oral Capsule;   Therapy: 30Sep2014 to Recorded  12. Hydrocodone-Acetaminophen 5-325 MG Oral Tablet; take  1-2 tab po q 4 hours prn pain;   Therapy: 13Aug2010 to (Last Rx:21Feb2011) Ordered  13. HYDROmorphone HCl - 4 MG Oral Tablet; 1 - 2 tabs q 4 - 6 Hrs. PRN pain;   Therapy: 10UVO5366 to (Last [id number]) Ordered  14. Mefloquine HCl - 250 MG Oral Tablet;   Therapy: 15Apr2014 to Recorded  15. Meloxicam  15 MG Oral Tablet;   Therapy: [id number] to Recorded  16. Multi-Vitamin Oral Tablet; with out iron;   Therapy: (Recorded:08Sep2008) to Recorded  17. Ondansetron 4 MG Oral Tablet Dispersible; TAKE 1 TABLET Every 4 hours PRN nausea   or vomiting;   Therapy: [id number] to (Last [id number])  Requested for: [id number] Ordered  18. Rizatriptan Benzoate  10 MG Oral Tablet Dispersible;   Therapy: [id number] to Recorded  19. Tekturna TABS;   Therapy: (Recorded:05Mar2009) to Recorded  20. Uribel 118 MG Oral Capsule; TAKE 1 CAPSULE 4 times daily;   Therapy: [id number] to (Last Rx:07Jul2014) Ordered  21. ValACYclovir HCl - 500 MG Oral Tablet;   Therapy: [id number] to Recorded  22. Zolpidem Tartrate 5 MG Oral Tablet;   Therapy: 04Apr2013 to Recorded  Allergies Medication   1. No Known Drug Allergies  Family History Problems   1. Family history of Chronic Obstructive Pulmonary Disease : Mother  2. Family history of Congestive Heart Failure : Mother  Social History Problems   1. Alcohol Use   Social  2. Former Smoker   Quit around [employer].  3. Marital History - Currently Married  4. Occupation:   Theatre manager  Review of Systems Genitourinary, constitutional and gastrointestinal system(s) were reviewed and pertinent findings if present are noted.  Genitourinary: urinary urgency and suprapubic pain.    Vitals Vital Signs [Data Includes: Last 1 Day]  Recorded: 26Jan2015 10:20AM  Height: 5 ft 1.5 in Weight: 148 lb  BMI Calculated: 27.51 BSA Calculated: 1.67 Blood Pressure: 131 / 79 Temperature: 97.5 F Heart Rate: 69  Physical Exam Constitutional: Well nourished and well developed . No acute distress.   Pulmonary: No respiratory distress and normal respiratory rhythm and effort.   Abdomen: The abdomen is soft and nontender. No CVA tenderness.   Neuro/Psych:. Mood and affect are appropriate.    Results/Data Urine [Data Includes: Last 1 Day]   [id number]  COLOR YELLOW   APPEARANCE CLEAR   SPECIFIC GRAVITY 1.010   pH 6.0   GLUCOSE NEG mg/dL  BILIRUBIN NEG   KETONE NEG mg/dL  BLOOD TRACE   PROTEIN NEG mg/dL  UROBILINOGEN 0.2 mg/dL  NITRITE NEG   LEUKOCYTE ESTERASE SMALL   SQUAMOUS EPITHELIAL/HPF RARE   WBC 21-50 WBC/hpf  RBC 3-6 RBC/hpf  BACTERIA RARE   CRYSTALS NONE SEEN   CASTS NONE SEEN    The  following images/tracing/specimen were independently visualized: Marland Kitchen        KUB: stable bilateral renal stone burden, no obvious stones seen along the course of either ureter, stable pelvic phleboliths CT: many bilateral non obstructing stones, no obvious stones seen along the course of either ureter or in the bladder, stable pelvic phleboliths- official radiology report still pending    Assessment Assessed   1. Increased urinary frequency (788.41)  2. Nephrolithiasis (592.0)         Per KUB, I did not see an obvious ureteral stone that would be causing Ms. Bayersdofer's symptoms.  Dr. Jeffie Pollock thought he saw a right UVJ stone on a KUB in December, but I do not see this calcification in question today.  Since she is about to go on a sailing trip in the [place], I felt it was best to order a CT today.  If she did have an ongoing stone causing her intermittent symptoms, she would want treatment prior to this trip.  The official CT report is still pending, but I do not see obvious ureteral stones that would be causing  her pressure.  I will culture her urine today to rule out UTI as a cause of the pressure although she has no bacteria in her urine today.  Pending the results of both the official CT report and culture we will determine follow up to hopefully ensure she is symptom free prior to her trip.   Plan Health Maintenance   1. UA With REFLEX; [Do Not Release]; Status:Complete;   Done: [id number] 10:09AM Nephrolithiasis   2. AU CT-STONE PROTOCOL; Status:In Progress - Specimen/Data Collected;   Done:  35DHR4163 12:00AM  3. KUB; Status:Complete;   Done: 84TXM4680 12:00AM        1. Culture urine- call with results 2. Call with official radiology results  3. Follow up plan to be determined

## 2013-10-13 NOTE — Anesthesia Preprocedure Evaluation (Addendum)
Anesthesia Evaluation  Patient identified by MRN, date of birth, ID band Patient awake    Reviewed: Allergy & Precautions, H&P , NPO status , Patient's Chart, lab work & pertinent test results  Airway Mallampati: II TM Distance: >3 FB Neck ROM: full    Dental no notable dental hx. (+) Teeth Intact and Dental Advisory Given,    Pulmonary asthma , former smoker,  Mild cold induced breath sounds clear to auscultation  Pulmonary exam normal       Cardiovascular Exercise Tolerance: Good hypertension, Pt. on medications + Peripheral Vascular Disease negative cardio ROS  Rhythm:regular Rate:Normal     Neuro/Psych PSYCHIATRIC DISORDERS Depression negative neurological ROS     GI/Hepatic negative GI ROS, Neg liver ROS,   Endo/Other  negative endocrine ROS  Renal/GU Renal disease     Musculoskeletal   Abdominal   Peds  Hematology  (+) Blood dyscrasia, anemia ,   Anesthesia Other Findings   Reproductive/Obstetrics negative OB ROS                       Anesthesia Physical  Anesthesia Plan  ASA: III  Anesthesia Plan: General   Post-op Pain Management:    Induction: Intravenous  Airway Management Planned:   Additional Equipment:   Intra-op Plan:   Post-operative Plan: Extubation in OR  Informed Consent: I have reviewed the patients History and Physical, chart, labs and discussed the procedure including the risks, benefits and alternatives for the proposed anesthesia with the patient or authorized representative who has indicated his/her understanding and acceptance.   Dental advisory given  Plan Discussed with: CRNA  Anesthesia Plan Comments:      Anesthesia Quick Evaluation

## 2013-10-14 ENCOUNTER — Ambulatory Visit (HOSPITAL_BASED_OUTPATIENT_CLINIC_OR_DEPARTMENT_OTHER): Payer: Medicare Other | Admitting: Anesthesiology

## 2013-10-14 ENCOUNTER — Encounter (HOSPITAL_BASED_OUTPATIENT_CLINIC_OR_DEPARTMENT_OTHER): Payer: Medicare Other | Admitting: Anesthesiology

## 2013-10-14 ENCOUNTER — Other Ambulatory Visit: Payer: Self-pay

## 2013-10-14 ENCOUNTER — Encounter (HOSPITAL_BASED_OUTPATIENT_CLINIC_OR_DEPARTMENT_OTHER)
Admission: RE | Disposition: A | Discharge status: Home / Self Care | Payer: Self-pay | Source: Ambulatory Visit | Attending: Urology

## 2013-10-14 ENCOUNTER — Encounter (HOSPITAL_BASED_OUTPATIENT_CLINIC_OR_DEPARTMENT_OTHER): Payer: Self-pay | Admitting: *Deleted

## 2013-10-14 ENCOUNTER — Ambulatory Visit (HOSPITAL_BASED_OUTPATIENT_CLINIC_OR_DEPARTMENT_OTHER)
Admission: RE | Admit: 2013-10-14 | Discharge: 2013-10-14 | Disposition: A | Discharge status: Home / Self Care | Payer: Medicare Other | Source: Ambulatory Visit | Attending: Urology | Admitting: Urology

## 2013-10-14 ENCOUNTER — Ambulatory Visit (HOSPITAL_COMMUNITY): Payer: Medicare Other

## 2013-10-14 DIAGNOSIS — N200 Calculus of kidney: Secondary | ICD-10-CM | POA: Diagnosis not present

## 2013-10-14 DIAGNOSIS — I739 Peripheral vascular disease, unspecified: Secondary | ICD-10-CM | POA: Diagnosis not present

## 2013-10-14 DIAGNOSIS — F3289 Other specified depressive episodes: Secondary | ICD-10-CM | POA: Insufficient documentation

## 2013-10-14 DIAGNOSIS — J45909 Unspecified asthma, uncomplicated: Secondary | ICD-10-CM | POA: Diagnosis not present

## 2013-10-14 DIAGNOSIS — Q6212 Congenital occlusion of ureterovesical orifice: Secondary | ICD-10-CM | POA: Diagnosis not present

## 2013-10-14 DIAGNOSIS — N201 Calculus of ureter: Secondary | ICD-10-CM | POA: Diagnosis not present

## 2013-10-14 DIAGNOSIS — Z90710 Acquired absence of both cervix and uterus: Secondary | ICD-10-CM | POA: Insufficient documentation

## 2013-10-14 DIAGNOSIS — F329 Major depressive disorder, single episode, unspecified: Secondary | ICD-10-CM | POA: Insufficient documentation

## 2013-10-14 DIAGNOSIS — Z9089 Acquired absence of other organs: Secondary | ICD-10-CM | POA: Diagnosis not present

## 2013-10-14 DIAGNOSIS — Z87442 Personal history of urinary calculi: Secondary | ICD-10-CM | POA: Diagnosis not present

## 2013-10-14 DIAGNOSIS — Z79899 Other long term (current) drug therapy: Secondary | ICD-10-CM | POA: Diagnosis not present

## 2013-10-14 DIAGNOSIS — I10 Essential (primary) hypertension: Secondary | ICD-10-CM | POA: Diagnosis not present

## 2013-10-14 HISTORY — PX: CYSTOSCOPY/RETROGRADE/URETEROSCOPY/STONE EXTRACTION WITH BASKET: SHX5317

## 2013-10-14 HISTORY — PX: HOLMIUM LASER APPLICATION: SHX5852

## 2013-10-14 HISTORY — DX: Occlusion and stenosis of unspecified carotid artery: I65.29

## 2013-10-14 HISTORY — DX: Unspecified glaucoma: H40.9

## 2013-10-14 HISTORY — DX: Personal history of transient ischemic attack (TIA), and cerebral infarction without residual deficits: Z86.73

## 2013-10-14 HISTORY — DX: Zoster without complications: B02.9

## 2013-10-14 SURGERY — CYSTOSCOPY, WITH CALCULUS REMOVAL USING BASKET
Anesthesia: General | Site: Ureter | Laterality: Right

## 2013-10-14 MED ORDER — PHENAZOPYRIDINE HCL 200 MG PO TABS
200.0000 mg | ORAL_TABLET | Freq: Three times a day (TID) | ORAL | Status: DC
  Administered 2013-10-14: 200 mg via ORAL
  Filled 2013-10-14: qty 1

## 2013-10-14 MED ORDER — HYDROMORPHONE HCL PF 1 MG/ML IJ SOLN
INTRAMUSCULAR | Status: AC
  Filled 2013-10-14: qty 1

## 2013-10-14 MED ORDER — PROMETHAZINE HCL 25 MG/ML IJ SOLN
6.2500 mg | INTRAMUSCULAR | Status: DC | PRN
  Filled 2013-10-14: qty 1

## 2013-10-14 MED ORDER — MEPERIDINE HCL 25 MG/ML IJ SOLN
6.2500 mg | INTRAMUSCULAR | Status: DC | PRN
  Filled 2013-10-14: qty 1

## 2013-10-14 MED ORDER — PROMETHAZINE HCL 25 MG PO TABS: 25.0000 mg | ORAL_TABLET | Freq: Four times a day (QID) | ORAL | Status: DC | PRN

## 2013-10-14 MED ORDER — MIDAZOLAM HCL 5 MG/5ML IJ SOLN
INTRAMUSCULAR | Status: DC | PRN
  Administered 2013-10-14: 1 mg via INTRAVENOUS

## 2013-10-14 MED ORDER — BELLADONNA ALKALOIDS-OPIUM 16.2-60 MG RE SUPP
RECTAL | Status: AC
  Filled 2013-10-14: qty 1

## 2013-10-14 MED ORDER — LACTATED RINGERS IV SOLN
INTRAVENOUS | Status: DC
  Administered 2013-10-14 (×2): via INTRAVENOUS
  Filled 2013-10-14: qty 1000

## 2013-10-14 MED ORDER — PHENAZOPYRIDINE HCL 200 MG PO TABS: 200.0000 mg | ORAL_TABLET | Freq: Three times a day (TID) | ORAL | Status: DC | PRN

## 2013-10-14 MED ORDER — OXYCODONE HCL 5 MG/5ML PO SOLN
5.0000 mg | Freq: Once | ORAL | Status: DC | PRN
  Filled 2013-10-14: qty 5

## 2013-10-14 MED ORDER — ONDANSETRON HCL 4 MG/2ML IJ SOLN
INTRAMUSCULAR | Status: DC | PRN
  Administered 2013-10-14: 4 mg via INTRAVENOUS

## 2013-10-14 MED ORDER — MIDAZOLAM HCL 2 MG/2ML IJ SOLN
INTRAMUSCULAR | Status: AC
  Filled 2013-10-14: qty 2

## 2013-10-14 MED ORDER — HYDROMORPHONE HCL PF 1 MG/ML IJ SOLN
0.2500 mg | INTRAMUSCULAR | Status: DC | PRN
  Administered 2013-10-14: 0.25 mg via INTRAVENOUS
  Filled 2013-10-14: qty 1

## 2013-10-14 MED ORDER — PROPOFOL 10 MG/ML IV BOLUS
INTRAVENOUS | Status: DC | PRN
  Administered 2013-10-14: 180 mg via INTRAVENOUS

## 2013-10-14 MED ORDER — HYDROCODONE-ACETAMINOPHEN 5-325 MG PO TABS
ORAL_TABLET | ORAL | Status: AC
  Filled 2013-10-14: qty 1

## 2013-10-14 MED ORDER — FENTANYL CITRATE 0.05 MG/ML IJ SOLN
INTRAMUSCULAR | Status: DC | PRN
  Administered 2013-10-14: 50 ug via INTRAVENOUS

## 2013-10-14 MED ORDER — LIDOCAINE HCL (CARDIAC) 20 MG/ML IV SOLN
INTRAVENOUS | Status: DC | PRN
  Administered 2013-10-14: 60 mg via INTRAVENOUS

## 2013-10-14 MED ORDER — SODIUM CHLORIDE 0.9 % IR SOLN
Status: DC | PRN
  Administered 2013-10-14: 4000 mL

## 2013-10-14 MED ORDER — EPHEDRINE SULFATE 50 MG/ML IJ SOLN
INTRAMUSCULAR | Status: DC | PRN
Start: 2013-10-14 — End: 2013-10-14
  Administered 2013-10-14: 10 mg via INTRAVENOUS

## 2013-10-14 MED ORDER — CIPROFLOXACIN IN D5W 400 MG/200ML IV SOLN
INTRAVENOUS | Status: AC
  Filled 2013-10-14: qty 200

## 2013-10-14 MED ORDER — FENTANYL CITRATE 0.05 MG/ML IJ SOLN
INTRAMUSCULAR | Status: AC
  Filled 2013-10-14: qty 6

## 2013-10-14 MED ORDER — HYDROCODONE-ACETAMINOPHEN 5-325 MG PO TABS
1.0000 | ORAL_TABLET | ORAL | Status: DC | PRN
  Administered 2013-10-14: 1 via ORAL
  Filled 2013-10-14: qty 1

## 2013-10-14 MED ORDER — DEXAMETHASONE SODIUM PHOSPHATE 4 MG/ML IJ SOLN
INTRAMUSCULAR | Status: DC | PRN
  Administered 2013-10-14: 10 mg via INTRAVENOUS

## 2013-10-14 MED ORDER — OXYCODONE HCL 5 MG PO TABS
5.0000 mg | ORAL_TABLET | Freq: Once | ORAL | Status: DC | PRN
Start: 2013-10-14 — End: 2013-10-14
  Filled 2013-10-14: qty 1

## 2013-10-14 MED ORDER — PHENAZOPYRIDINE HCL 100 MG PO TABS
ORAL_TABLET | ORAL | Status: AC
  Filled 2013-10-14: qty 2

## 2013-10-14 MED ORDER — CIPROFLOXACIN IN D5W 400 MG/200ML IV SOLN
400.0000 mg | INTRAVENOUS | Status: AC
  Administered 2013-10-14: 400 mg via INTRAVENOUS
  Filled 2013-10-14: qty 200

## 2013-10-14 MED ORDER — IOHEXOL 350 MG/ML SOLN
INTRAVENOUS | Status: DC | PRN
  Administered 2013-10-14: 29 mL

## 2013-10-14 MED ORDER — KETOROLAC TROMETHAMINE 30 MG/ML IJ SOLN
INTRAMUSCULAR | Status: DC | PRN
  Administered 2013-10-14: 30 mg via INTRAVENOUS

## 2013-10-14 MED ORDER — BELLADONNA ALKALOIDS-OPIUM 16.2-60 MG RE SUPP
RECTAL | Status: DC | PRN
  Administered 2013-10-14: 1 via RECTAL

## 2013-10-14 SURGICAL SUPPLY — 33 items
BAG DRAIN URO-CYSTO SKYTR STRL (DRAIN) ×4 IMPLANT
BASKET LASER NITINOL 1.9FR (BASKET) IMPLANT
BASKET ZERO TIP NITINOL 2.4FR (BASKET) ×4 IMPLANT
BRUSH URET BIOPSY 3F (UROLOGICAL SUPPLIES) IMPLANT
CANISTER SUCT LVC 12 LTR MEDI- (MISCELLANEOUS) ×4 IMPLANT
CATH URET 5FR 28IN CONE TIP (BALLOONS)
CATH URET 5FR 28IN OPEN ENDED (CATHETERS) IMPLANT
CATH URET 5FR 70CM CONE TIP (BALLOONS) IMPLANT
CLOTH BEACON ORANGE TIMEOUT ST (SAFETY) ×4 IMPLANT
DRAPE CAMERA CLOSED 9X96 (DRAPES) ×4 IMPLANT
ELECT REM PT RETURN 9FT ADLT (ELECTROSURGICAL)
ELECTRODE REM PT RTRN 9FT ADLT (ELECTROSURGICAL) IMPLANT
FIBER LASER FLEXIVA 1000 (UROLOGICAL SUPPLIES) IMPLANT
FIBER LASER FLEXIVA 200 (UROLOGICAL SUPPLIES) IMPLANT
FIBER LASER FLEXIVA 365 (UROLOGICAL SUPPLIES) IMPLANT
FIBER LASER FLEXIVA 550 (UROLOGICAL SUPPLIES) IMPLANT
GLOVE SURG SS PI 8.0 STRL IVOR (GLOVE) ×4 IMPLANT
GOWN PREVENTION PLUS LG XLONG (DISPOSABLE) IMPLANT
GOWN STRL REIN XL XLG (GOWN DISPOSABLE) IMPLANT
GOWN STRL REUS W/TWL LRG LVL3 (GOWN DISPOSABLE) ×4 IMPLANT
GOWN STRL REUS W/TWL XL LVL3 (GOWN DISPOSABLE) ×4 IMPLANT
GUIDEWIRE 0.038 PTFE COATED (WIRE) IMPLANT
GUIDEWIRE ANG ZIPWIRE 038X150 (WIRE) IMPLANT
GUIDEWIRE STR DUAL SENSOR (WIRE) ×8 IMPLANT
IV NS IRRIG 3000ML ARTHROMATIC (IV SOLUTION) ×4 IMPLANT
KIT BALLIN UROMAX 15FX10 (LABEL) IMPLANT
KIT BALLN UROMAX 15FX4 (MISCELLANEOUS) IMPLANT
KIT BALLN UROMAX 26 75X4 (MISCELLANEOUS)
PACK CYSTOSCOPY (CUSTOM PROCEDURE TRAY) ×4 IMPLANT
SET HIGH PRES BAL DIL (LABEL)
SHEATH ACCESS URETERAL 38CM (SHEATH) IMPLANT
SHEATH ACCESS URETERAL 54CM (SHEATH) IMPLANT
STENT URET 6FRX24 CONTOUR (STENTS) ×4 IMPLANT

## 2013-10-14 NOTE — Anesthesia Procedure Notes (Signed)
Procedure Name: LMA Insertion Date/Time: 10/14/2013 11:12 AM Performed by: Denna Haggard D Pre-anesthesia Checklist: Patient identified, Emergency Drugs available, Suction available and Patient being monitored Patient Re-evaluated:Patient Re-evaluated prior to inductionOxygen Delivery Method: Circle System Utilized Preoxygenation: Pre-oxygenation with 100% oxygen Intubation Type: IV induction Ventilation: Mask ventilation without difficulty LMA: LMA inserted LMA Size: 4.0 Number of attempts: 1 Airway Equipment and Method: bite block Placement Confirmation: positive ETCO2 Tube secured with: Tape Dental Injury: Teeth and Oropharynx as per pre-operative assessment

## 2013-10-14 NOTE — Interval H&P Note (Signed)
History and Physical Interval Note:  10/14/2013 10:56 AM  Ashley [name]  has presented today for surgery, with the diagnosis of Right Ureteral Vesicle Junction Stone  The various methods of treatment have been discussed with the patient and family. After consideration of risks, benefits and other options for treatment, the patient has consented to  Procedure(s) with comments: CYSTOSCOPY/RETROGRADE/URETEROSCOPY/STONE EXTRACTION WITH BASKET (Right) - POSSIBLE JJ STENT   HOLMIUM LASER APPLICATION (Right) as a surgical intervention .  The patient's history has been reviewed, patient examined, no change in status, stable for surgery.  I have reviewed the patient's chart and labs.  Questions were answered to the patient's satisfaction.     Lessie Manigo J

## 2013-10-14 NOTE — Brief Op Note (Signed)
10/14/2013  11:46 AM  PATIENT:  Ashley [name]  73 y.o. female  PRE-OPERATIVE DIAGNOSIS:  Right Ureteral Vesicle Junction Stone  POST-OPERATIVE DIAGNOSIS:  Right Ureteral Vesicle Junction Stone  PROCEDURE:  Procedure(s): CYSTOSCOPY/RETROGRADE/URETEROSCOPY/STONE EXTRACTION WITH BASKET (Right) STENT INSERTION (Right)  SURGEON:  Surgeon(s) and Role:    * Irine Seal, MD - Primary  PHYSICIAN ASSISTANT:   ASSISTANTS: none   ANESTHESIA:   general  EBL:  Total I/O In: 200 [I.V.:200] Out: -   BLOOD ADMINISTERED:none  DRAINS: 6x24 right JJ stent with string   LOCAL MEDICATIONS USED:  NONE  SPECIMEN:  Source of Specimen:  right ureteral stone  DISPOSITION OF SPECIMEN:  to family to bring to office  COUNTS:  YES  TOURNIQUET:  * No tourniquets in log *  DICTATION: .Other Dictation: Dictation Number [id number]  PLAN OF CARE: Discharge to home after PACU  PATIENT DISPOSITION:  PACU - hemodynamically stable.   Delay start of Pharmacological VTE agent (>24hrs) due to surgical blood loss or risk of bleeding: not applicable

## 2013-10-14 NOTE — Progress Notes (Signed)
Patient had some question about proceeding with surgery if she did not receive the medication mixture that she received when Dr. Terance Hart was her surgeon.  Dr. Lissa Hoard spoke patient and her husband two time and it was decided that she would proceed with the surgery. Dr. Lissa Hoard asked that we not start IV until he had time to speak her.  Therefore, the IV was started very close to surgery time

## 2013-10-14 NOTE — Transfer of Care (Signed)
Immediate Anesthesia Transfer of Care Note  Patient: Ashley [name]  Procedure(s) Performed: Procedure(s) (LRB): CYSTOSCOPY/RETROGRADE/URETEROSCOPY/STONE EXTRACTION WITH BASKET (Right) HOLMIUM LASER APPLICATION (Right)  Patient Location: PACU  Anesthesia Type: General  Level of Consciousness: awake, oriented, sedated and patient cooperative  Airway & Oxygen Therapy: Patient Spontanous Breathing and Patient connected to face mask oxygen  Post-op Assessment: Report given to PACU RN and Post -op Vital signs reviewed and stable  Post vital signs: Reviewed and stable  Complications: No apparent anesthesia complications

## 2013-10-14 NOTE — Discharge Instructions (Addendum)
Ureteral Stent Implantation, Care After Refer to this sheet in the next few weeks. These instructions provide you with information on caring for yourself after your procedure. Your health care provider may also give you more specific instructions. Your treatment has been planned according to current medical practices, but problems sometimes occur. Call your health care provider if you have any problems or questions after your procedure. WHAT TO EXPECT AFTER THE PROCEDURE You should be back to normal activity within 48 hours after the procedure. Nausea and vomiting may occur and are commonly the result of anesthesia. It is common to experience sharp pain in the back or lower abdomen and penis with voiding. This is caused by movement of the ends of the stent with the act of urinating.It usually goes away within minutes after you have stopped urinating. HOME CARE INSTRUCTIONS Make sure to drink plenty of fluids. You may have small amounts of bleeding, causing your urine to be red. This is normal. Certain movements may trigger pain or a feeling that you need to urinate. You may be given medications to prevent infection or bladder spasms. Be sure to take all medications as directed. Only take over-the-counter or prescription medicines for pain, discomfort, or fever as directed by your health care provider. Do not take aspirin, as this can make bleeding worse. Your stent will be left in until the blockage is resolved. This may take 2 weeks or longer, depending on the reason for stent implantation. You may have an X-ray exam to make sure your ureter is open and that the stent has not moved out of position (migrated). The stent can be removed by your health care provider in the office. Medications may be given for comfort while the stent is being removed. Be sure to keep all follow-up appointments so your health care provider can check that you are healing properly. SEEK MEDICAL CARE IF:  You experience  increasing pain.  Your pain medication is not working. SEEK IMMEDIATE MEDICAL CARE IF:  Your urine is dark red or has blood clots.  You are leaking urine (incontinent).  You have a fever, chills, feeling sick to your stomach (nausea), or vomiting.  Your pain is not relieved by pain medication.  The end of the stent comes out of the urethra.  You are unable to urinate. Document Released: 05/05/2013 Document Reviewed: 05/05/2013 [facility] Patient Information 2014 [place].  You may remove the stent in the morning.    Post Anesthesia Home Care Instructions  Activity: Get plenty of rest for the remainder of the day. A responsible adult should stay with you for 24 hours following the procedure.  For the next 24 hours, DO NOT: -Drive a car -Paediatric nurse -Drink alcoholic beverages -Take any medication unless instructed by your physician -Make any legal decisions or sign important papers.  Meals: Start with liquid foods such as gelatin or soup. Progress to regular foods as tolerated. Avoid greasy, spicy, heavy foods. If nausea and/or vomiting occur, drink only clear liquids until the nausea and/or vomiting subsides. Call your physician if vomiting continues.  Special Instructions/Symptoms: Your throat may feel dry or sore from the anesthesia or the breathing tube placed in your throat during surgery. If this causes discomfort, gargle with warm salt water. The discomfort should disappear within 24 hours.

## 2013-10-14 NOTE — Anesthesia Postprocedure Evaluation (Signed)
Anesthesia Post Note  Patient: Ashley [name]  Procedure(s) Performed: Procedure(s) (LRB): CYSTOSCOPY/RETROGRADE/URETEROSCOPY/STONE EXTRACTION WITH BASKET (Right) HOLMIUM LASER APPLICATION (Right)  Anesthesia type: General  Patient location: PACU  Post pain: Pain level controlled  Post assessment: Post-op Vital signs reviewed  Last Vitals: BP 109/63  Pulse 76  Temp(Src) 36.2 C (Oral)  Resp 18  Ht 5' 1.5" (1.562 m)  Wt 151 lb (68.493 kg)  BMI 28.07 kg/m2  SpO2 100%  Post vital signs: Reviewed  Level of consciousness: sedated  Complications: No apparent anesthesia complications

## 2013-10-15 ENCOUNTER — Encounter (HOSPITAL_BASED_OUTPATIENT_CLINIC_OR_DEPARTMENT_OTHER): Payer: Self-pay | Admitting: Urology

## 2013-10-15 DIAGNOSIS — N200 Calculus of kidney: Secondary | ICD-10-CM | POA: Diagnosis not present

## 2013-10-15 NOTE — Op Note (Signed)
NAMEMIYOKO, HASHIMI NO.:  [id number]  MEDICAL RECORD NO.:  [id number]  LOCATION:                               FACILITY:  [facility]  PHYSICIAN:  Marshall Cork. Jeffie Pollock, M.D.    DATE OF BIRTH:  October 20, 1940  DATE OF PROCEDURE:  10/14/2013 DATE OF DISCHARGE:  10/14/2013                              OPERATIVE REPORT   PROCEDURES:  Cystoscopy, right retrograde pyelogram with interpretation, right ureteroscopic stone extraction, insertion of right double-J stent.  PREOPERATIVE DIAGNOSIS:  Right distal ureteral stone.  POSTOPERATIVE DIAGNOSIS:  Right distal ureteral stone.  SURGEON:  Marshall Cork. Jeffie Pollock, M.D.  ANESTHESIA:  General.  SPECIMEN:  Stone.  DRAINS:  A 6-French 24-cm double-J stent with string.  COMPLICATIONS:  None.  INDICATIONS:  Ashley [name] is a 73 year old white female with recurrent urolithiasis.  She has been dealing with the right distal stone for the last several weeks and has elected to undergo ureteroscopy.  FINDINGS AND PROCEDURE:  She was given Toradol, Decadron, Zofran, and a B and O suppository in the holding area as this has been found to reduce her postoperative pain and spasms.  She was taken to the operating room where general anesthetic was induced.  She was given 400 mg of Cipro IV. She was placed in lithotomy position and fitted with PAS hose.  Her perineum and genitalia were prepped with Betadine solution.  She was draped in usual sterile fashion.  Cystoscopy was performed using the 22-French scope and 12-degree lens. Examination revealed the normal urethra.  The bladder wall had mild trabeculation.  No tumors, stones, or inflammation were noted.  The ureteral orifices were in the normal anatomic position, but she did have a cystocele.  So, scope angulation down was required to visualize the ureters.  The right ureter was cannulated with a 5-French open-end catheter and contrast was instilled.  Initially, no obvious filling defect was identified in  the distal ureter; however, there was some narrowing right at the meatus.  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  No significant filling defects were noted in the visualized portion of the intrarenal collecting system, which surprising considering her multiple stones.  Once the retrograde pyelography was indicated, I felt that the stone which had seen on preoperative KUB had probably migrated back up in the ureter.  So, a guidewire was passed to the kidney and a 6.4-French short ureteroscope was inserted alongside the wire.  There was no difficulty transitioning the distal ureter.  The stone was found actually in the midureter.  It was grasped with a 0 tip Nitinol basket and removed.  I then inspected more proximally just to make sure there were no other stones because of her history of large stone burden, none were seen.  On fluoroscopic image after ureteroscopy, there appeared to be fairly extensive extravasation of contrast in the peripelvic area.  I originally not intended to place a stent, but because of this extravasation, I felt that was necessary.  Repeat retrograde pyelography was performed, which revealed no active extravasation under low pressure, which was confining, but still felt a stent was indicated. So, guidewire was  passed to the kidney and the cystoscope was reinserted.  A 6-French 24-cm double-J stent with string was inserted over the wire to the kidney under fluoroscopic guidance.  The wire was removed leaving good coil in the kidney and good coil in the bladder. The bladder was drained.  The cystoscope was removed leaving the stent string exiting the urethra.  The patient was taken down from lithotomy position and her anesthetic was reversed.  She was moved to the recovery room in stable condition.  There were no complications.     Marshall Cork. Jeffie Pollock, M.D.     JJW/MEDQ  D:   10/14/2013  T:  10/15/2013  Job:  [id number]

## 2013-10-18 DIAGNOSIS — R928 Other abnormal and inconclusive findings on diagnostic imaging of breast: Secondary | ICD-10-CM | POA: Diagnosis not present

## 2013-10-18 DIAGNOSIS — N644 Mastodynia: Secondary | ICD-10-CM | POA: Diagnosis not present

## 2013-10-18 LAB — POCT I-STAT 4, (NA,K, GLUC, HGB,HCT)
Glucose, Bld: 105 mg/dL — ABNORMAL HIGH (ref 70–99)
HCT: 43 % (ref 36.0–46.0)
Hemoglobin: 14.6 g/dL (ref 12.0–15.0)
Potassium: 4.3 mEq/L (ref 3.7–5.3)
Sodium: 141 mEq/L (ref 137–147)

## 2013-10-19 ENCOUNTER — Other Ambulatory Visit: Payer: Self-pay | Admitting: Family Medicine

## 2013-10-19 DIAGNOSIS — R928 Other abnormal and inconclusive findings on diagnostic imaging of breast: Secondary | ICD-10-CM

## 2013-10-19 DIAGNOSIS — N644 Mastodynia: Secondary | ICD-10-CM

## 2013-10-20 DIAGNOSIS — N201 Calculus of ureter: Secondary | ICD-10-CM | POA: Diagnosis not present

## 2013-10-20 DIAGNOSIS — R3129 Other microscopic hematuria: Secondary | ICD-10-CM | POA: Diagnosis not present

## 2013-10-21 ENCOUNTER — Ambulatory Visit
Admission: RE | Admit: 2013-10-21 | Discharge: 2013-10-21 | Disposition: A | Discharge status: Home / Self Care | Payer: Medicare Other | Source: Ambulatory Visit | Attending: Family Medicine | Admitting: Family Medicine

## 2013-10-21 DIAGNOSIS — N644 Mastodynia: Secondary | ICD-10-CM

## 2013-10-21 DIAGNOSIS — N6489 Other specified disorders of breast: Secondary | ICD-10-CM | POA: Diagnosis not present

## 2013-10-21 DIAGNOSIS — R928 Other abnormal and inconclusive findings on diagnostic imaging of breast: Secondary | ICD-10-CM

## 2013-10-21 MED ORDER — GADOBENATE DIMEGLUMINE 529 MG/ML IV SOLN
13.0000 mL | Freq: Once | INTRAVENOUS | Status: AC | PRN
  Administered 2013-10-21: 13 mL via INTRAVENOUS

## 2013-10-22 ENCOUNTER — Other Ambulatory Visit: Payer: Self-pay | Admitting: Family Medicine

## 2013-10-22 DIAGNOSIS — R928 Other abnormal and inconclusive findings on diagnostic imaging of breast: Secondary | ICD-10-CM

## 2013-10-22 DIAGNOSIS — N6489 Other specified disorders of breast: Secondary | ICD-10-CM

## 2013-10-26 ENCOUNTER — Ambulatory Visit
Admission: RE | Admit: 2013-10-26 | Discharge: 2013-10-26 | Disposition: A | Discharge status: Home / Self Care | Payer: Medicare Other | Source: Ambulatory Visit | Attending: Family Medicine | Admitting: Family Medicine

## 2013-10-26 DIAGNOSIS — R922 Inconclusive mammogram: Secondary | ICD-10-CM | POA: Diagnosis not present

## 2013-10-26 DIAGNOSIS — N6489 Other specified disorders of breast: Secondary | ICD-10-CM

## 2013-10-26 DIAGNOSIS — R82998 Other abnormal findings in urine: Secondary | ICD-10-CM | POA: Diagnosis not present

## 2013-10-26 DIAGNOSIS — N200 Calculus of kidney: Secondary | ICD-10-CM | POA: Diagnosis not present

## 2013-11-08 DIAGNOSIS — N390 Urinary tract infection, site not specified: Secondary | ICD-10-CM | POA: Diagnosis not present

## 2013-11-08 DIAGNOSIS — M5137 Other intervertebral disc degeneration, lumbosacral region: Secondary | ICD-10-CM | POA: Diagnosis not present

## 2013-11-08 DIAGNOSIS — R82998 Other abnormal findings in urine: Secondary | ICD-10-CM | POA: Diagnosis not present

## 2013-11-08 DIAGNOSIS — N200 Calculus of kidney: Secondary | ICD-10-CM | POA: Diagnosis not present

## 2013-11-08 DIAGNOSIS — S40019A Contusion of unspecified shoulder, initial encounter: Secondary | ICD-10-CM | POA: Diagnosis not present

## 2013-11-08 DIAGNOSIS — S2239XA Fracture of one rib, unspecified side, initial encounter for closed fracture: Secondary | ICD-10-CM | POA: Diagnosis not present

## 2013-11-09 ENCOUNTER — Other Ambulatory Visit: Payer: Medicare Other

## 2013-12-01 ENCOUNTER — Other Ambulatory Visit: Payer: Medicare Other

## 2013-12-03 DIAGNOSIS — S2239XA Fracture of one rib, unspecified side, initial encounter for closed fracture: Secondary | ICD-10-CM | POA: Diagnosis not present

## 2013-12-03 DIAGNOSIS — G501 Atypical facial pain: Secondary | ICD-10-CM | POA: Diagnosis not present

## 2013-12-03 DIAGNOSIS — R42 Dizziness and giddiness: Secondary | ICD-10-CM | POA: Diagnosis not present

## 2013-12-04 DIAGNOSIS — J019 Acute sinusitis, unspecified: Secondary | ICD-10-CM | POA: Diagnosis not present

## 2013-12-16 ENCOUNTER — Ambulatory Visit
Admission: RE | Admit: 2013-12-16 | Discharge: 2013-12-16 | Disposition: A | Discharge status: Home / Self Care | Payer: Medicare Other | Source: Ambulatory Visit | Attending: Family Medicine | Admitting: Family Medicine

## 2013-12-16 DIAGNOSIS — R928 Other abnormal and inconclusive findings on diagnostic imaging of breast: Secondary | ICD-10-CM

## 2013-12-16 DIAGNOSIS — N6019 Diffuse cystic mastopathy of unspecified breast: Secondary | ICD-10-CM | POA: Diagnosis not present

## 2013-12-16 DIAGNOSIS — N6089 Other benign mammary dysplasias of unspecified breast: Secondary | ICD-10-CM | POA: Diagnosis not present

## 2013-12-16 MED ORDER — GADOBENATE DIMEGLUMINE 529 MG/ML IV SOLN
13.0000 mL | Freq: Once | INTRAVENOUS | Status: AC | PRN
  Administered 2013-12-16: 13 mL via INTRAVENOUS

## 2013-12-27 DIAGNOSIS — H04129 Dry eye syndrome of unspecified lacrimal gland: Secondary | ICD-10-CM | POA: Diagnosis not present

## 2013-12-27 DIAGNOSIS — H2510 Age-related nuclear cataract, unspecified eye: Secondary | ICD-10-CM | POA: Diagnosis not present

## 2013-12-27 DIAGNOSIS — H4010X0 Unspecified open-angle glaucoma, stage unspecified: Secondary | ICD-10-CM | POA: Diagnosis not present

## 2013-12-27 DIAGNOSIS — H5210 Myopia, unspecified eye: Secondary | ICD-10-CM | POA: Diagnosis not present

## 2013-12-28 DIAGNOSIS — Z85828 Personal history of other malignant neoplasm of skin: Secondary | ICD-10-CM | POA: Diagnosis not present

## 2013-12-28 DIAGNOSIS — D1801 Hemangioma of skin and subcutaneous tissue: Secondary | ICD-10-CM | POA: Diagnosis not present

## 2013-12-28 DIAGNOSIS — D239 Other benign neoplasm of skin, unspecified: Secondary | ICD-10-CM | POA: Diagnosis not present

## 2013-12-28 DIAGNOSIS — L578 Other skin changes due to chronic exposure to nonionizing radiation: Secondary | ICD-10-CM | POA: Diagnosis not present

## 2013-12-28 DIAGNOSIS — D235 Other benign neoplasm of skin of trunk: Secondary | ICD-10-CM | POA: Diagnosis not present

## 2013-12-28 DIAGNOSIS — L739 Follicular disorder, unspecified: Secondary | ICD-10-CM | POA: Diagnosis not present

## 2013-12-28 DIAGNOSIS — Z85820 Personal history of malignant melanoma of skin: Secondary | ICD-10-CM | POA: Diagnosis not present

## 2014-01-12 DIAGNOSIS — H2510 Age-related nuclear cataract, unspecified eye: Secondary | ICD-10-CM | POA: Diagnosis not present

## 2014-01-12 DIAGNOSIS — H04129 Dry eye syndrome of unspecified lacrimal gland: Secondary | ICD-10-CM | POA: Diagnosis not present

## 2014-01-21 DIAGNOSIS — M2550 Pain in unspecified joint: Secondary | ICD-10-CM | POA: Diagnosis not present

## 2014-01-21 DIAGNOSIS — R079 Chest pain, unspecified: Secondary | ICD-10-CM | POA: Diagnosis not present

## 2014-01-21 DIAGNOSIS — M546 Pain in thoracic spine: Secondary | ICD-10-CM | POA: Diagnosis not present

## 2014-01-28 ENCOUNTER — Other Ambulatory Visit (HOSPITAL_COMMUNITY): Payer: Self-pay | Admitting: Specialist

## 2014-01-28 DIAGNOSIS — R0781 Pleurodynia: Secondary | ICD-10-CM

## 2014-01-31 DIAGNOSIS — N61 Mastitis without abscess: Secondary | ICD-10-CM | POA: Diagnosis not present

## 2014-02-08 DIAGNOSIS — N61 Mastitis without abscess: Secondary | ICD-10-CM | POA: Diagnosis not present

## 2014-02-14 ENCOUNTER — Encounter (HOSPITAL_COMMUNITY)
Admission: RE | Admit: 2014-02-14 | Discharge: 2014-02-14 | Disposition: A | Discharge status: Home / Self Care | Payer: Medicare Other | Source: Ambulatory Visit | Attending: Specialist | Admitting: Specialist

## 2014-02-14 ENCOUNTER — Other Ambulatory Visit (HOSPITAL_COMMUNITY): Payer: Self-pay | Admitting: Specialist

## 2014-02-14 DIAGNOSIS — M948X9 Other specified disorders of cartilage, unspecified sites: Secondary | ICD-10-CM | POA: Diagnosis not present

## 2014-02-14 DIAGNOSIS — R079 Chest pain, unspecified: Secondary | ICD-10-CM | POA: Diagnosis not present

## 2014-02-14 DIAGNOSIS — R0781 Pleurodynia: Secondary | ICD-10-CM

## 2014-02-14 MED ORDER — TECHNETIUM TC 99M MEDRONATE IV KIT
26.7000 | PACK | Freq: Once | INTRAVENOUS | Status: AC | PRN
  Administered 2014-02-14: 26.7 via INTRAVENOUS

## 2014-02-21 DIAGNOSIS — H10019 Acute follicular conjunctivitis, unspecified eye: Secondary | ICD-10-CM | POA: Diagnosis not present

## 2014-02-28 DIAGNOSIS — H2510 Age-related nuclear cataract, unspecified eye: Secondary | ICD-10-CM | POA: Diagnosis not present

## 2014-02-28 DIAGNOSIS — H4011X0 Primary open-angle glaucoma, stage unspecified: Secondary | ICD-10-CM | POA: Diagnosis not present

## 2014-03-01 DIAGNOSIS — N644 Mastodynia: Secondary | ICD-10-CM | POA: Diagnosis not present

## 2014-03-01 DIAGNOSIS — G501 Atypical facial pain: Secondary | ICD-10-CM | POA: Diagnosis not present

## 2014-03-01 DIAGNOSIS — H409 Unspecified glaucoma: Secondary | ICD-10-CM | POA: Diagnosis not present

## 2014-03-02 DIAGNOSIS — H2510 Age-related nuclear cataract, unspecified eye: Secondary | ICD-10-CM | POA: Diagnosis not present

## 2014-03-07 DIAGNOSIS — H269 Unspecified cataract: Secondary | ICD-10-CM | POA: Diagnosis not present

## 2014-03-07 DIAGNOSIS — H2510 Age-related nuclear cataract, unspecified eye: Secondary | ICD-10-CM | POA: Diagnosis not present

## 2014-03-07 DIAGNOSIS — H2589 Other age-related cataract: Secondary | ICD-10-CM | POA: Diagnosis not present

## 2014-03-28 ENCOUNTER — Other Ambulatory Visit: Payer: Self-pay | Admitting: Dermatology

## 2014-03-28 DIAGNOSIS — Z85820 Personal history of malignant melanoma of skin: Secondary | ICD-10-CM | POA: Diagnosis not present

## 2014-03-28 DIAGNOSIS — Z85828 Personal history of other malignant neoplasm of skin: Secondary | ICD-10-CM | POA: Diagnosis not present

## 2014-03-28 DIAGNOSIS — D485 Neoplasm of uncertain behavior of skin: Secondary | ICD-10-CM | POA: Diagnosis not present

## 2014-03-28 DIAGNOSIS — D239 Other benign neoplasm of skin, unspecified: Secondary | ICD-10-CM | POA: Diagnosis not present

## 2014-03-28 DIAGNOSIS — T148 Other injury of unspecified body region: Secondary | ICD-10-CM | POA: Diagnosis not present

## 2014-03-31 DIAGNOSIS — H2510 Age-related nuclear cataract, unspecified eye: Secondary | ICD-10-CM | POA: Diagnosis not present

## 2014-04-04 DIAGNOSIS — H2589 Other age-related cataract: Secondary | ICD-10-CM | POA: Diagnosis not present

## 2014-04-04 DIAGNOSIS — H2510 Age-related nuclear cataract, unspecified eye: Secondary | ICD-10-CM | POA: Diagnosis not present

## 2014-05-25 ENCOUNTER — Telehealth: Payer: Self-pay | Admitting: Internal Medicine

## 2014-05-25 NOTE — Telephone Encounter (Signed)
Called spoke with patient who reports prod cough with light yellow mucus, occasional wheezing and fatigue/lack of energy x4-6 weeks.  She has been taking Benadryl prn.  Pt denies any any f/c/s, n/v/d, hemoptysis, dyspnea, PND.  Advised pt CY is out of the office this afternoon but will send note to CY to see if rx can be sent to pharmacy.  Pt advised she would rather "see him sometime in the next week" rather than have rx called into her pharmacy.  Advised pt there are no openings until 9.17.15 and then after that there are no openings until late October.  Pt okay with the 9.17.15 appt and reported that she would like to wait for her appt to receive treatment rather than asking him now.  Encouraged pt to call the office should her symptoms worsen prior to ov.  Pt voiced her understanding.  Last ov 10.2.13. Will sign off.

## 2014-06-02 ENCOUNTER — Encounter: Payer: Self-pay | Admitting: Internal Medicine

## 2014-06-02 ENCOUNTER — Ambulatory Visit: Payer: Medicare Other | Admitting: Internal Medicine

## 2014-06-02 VITALS — BP 144/80 | HR 94 | Ht 61.0 in | Wt 148.0 lb

## 2014-06-02 DIAGNOSIS — J302 Other seasonal allergic rhinitis: Secondary | ICD-10-CM

## 2014-06-02 NOTE — Progress Notes (Signed)
Patient ID: Ashley [name], female    DOB: November 12, 1940, 73 y.o.   MRN: [id number]  HPI 73 yo F former smoker followed for allergic rhinitis with hx asthmatic bronchitis and acute rhinosinusitis.  We called in a Z pak for a cold with sinus complaints- she cleared completely for a month then over last 6 weeks she has had persistent nasal congestion, rhinorhea and cough with light yellow phlegm. Valtrex helped for canker sores with her cold. She is taking an anticongestant, otc . Main complaints are watery eyes, cough and sneeze.  05/15/11-  54 yo F former smoker followed for allergic rhinitis with hx asthmatic bronchitis and acute rhinosinusitis. We had sent Z pak in February.  She is coughing again. 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 Head congestion, cough, some drainage, no fever, no sore throat, minimal wheeze. GI ok.  Pending parathyroid surgery for hx of multiple kidney stones. Not taking prednisone.  06/24/11-   89 yo F former smoker followed for allergic rhinitis with hx asthmatic bronchitis and acute rhinosinusitis. Husband here today. 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 Several on that trip shared a cold. She had not cleared completely before leaving and used Zithromax at the beginning of that trip. She has been back a week, and now in the past 5 days has hacking cough, low-grade fever, clear to yellow sputum. Blowing her nose and coughing productively especially in the last 2 days. Some wheeze. Sore throat and yesterday. She has now taken 4 days of Cipro. Chest is not as tight as 2 days ago.  05/14/12- 58 yo F former smoker followed for allergic rhinitis with hx asthmatic bronchitis and acute rhinosinusitis. Unsure if she has true allergies-no complaints at this time Bilateral rib pains. Had GI workup for GERD-can't tell Prilosec helps the rib pain. We discussed nerve root irritation from arthritis in the  spine as one of the causes for this complaint. Rhinitis symptoms are worst in the spring and currently minor. No recent significant cough or wheeze. She is always thinking ahead to her next travel and necessary preparation including antibiotic to carry.  06/17/12- 73 yo F former smoker followed for allergic rhinitis with hx asthmatic bronchitis and acute rhinosinusitis. No antihistamines, OTC cough syrups, or sleep aids in past 3 days, Did have migraine this morning and took RX for that. Describes a burning/hurting eyes at inspiratory pressure. Variable sneeze cough and phlegm with occasional watery nose. Allergy Profile 05/14/2012: Total IgE 280.3 without specific elevation. Allergy Skin Testing: Positive for grass pollens, tree pollens, dust mite, some molds. We educated on environmental controls for dust and pollen, gave sample Dymista nasal spray and she can consider allergy vaccine as an option later if needed.  06/02/14- 74 yo F former smoker followed for allergic rhinitis with hx asthmatic bronchitis and acute rhinosinusitis. FOLLOWS FOR: pt last seen by CY on 06/2012. Pt c/o cough with little mucus production, light yellow in color. Pt states taking benadryl  daily when coughing helps relieve cough. Pt denies SOB and CP/tightness.    Review of Systems-See HPI Constitutional:   No weight loss, night sweats, fevers, chills, fatigue, lassitude. HEENT:   No headaches,  Difficulty swallowing,  Tooth/dental problems,  +Sore throats,  CV:  No-  chest pain,  no-Orthopnea, PND, swelling in lower extremities, anasarca, dizziness, palpitations GI  No heartburn, indigestion, abdominal pain, nausea, vomiting,  Resp: No shortness  of breath with exertion or at rest.  No excess mucus, no- productive cough,  No non-productive cough,  No coughing up of blood.  No- change in color of mucus.  No- wheezing.  Skin: no rash or lesions. GU:  MS:  No joint pain or swelling.   Psych:  No change in mood or affect.  No depression or anxiety.  No memory loss.     Objective:   Physical Exam General- Alert, Oriented, Affect-appropriate, Distress- none acute Skin- rash-none, lesions- none, excoriation- none Lymphadenopathy- none Head- atraumatic            Eyes- Gross vision intact, PERRLA, conjunctivae clear secretions            Ears- Hearing, canals-normal            Nose- Clear, no-Septal dev, mucus, polyps, erosion, perforation             Throat- Mallampati II , mucosa clear , drainage- none, tonsils- atrophic Neck- flexible , trachea midline, no stridor , thyroid nl, carotid no bruit Chest - symmetrical excursion , unlabored           Heart/CV- RRR , no murmur , no gallop  , no rub, nl s1 s2                           - JVD- none , edema- none, stasis changes- none, varices- none           Lung- clear, unlabored,  dullness-none, rub- none           Chest wall-  Abd-  Br/ Gen/ Rectal- Not done, not indicated Extrem- cyanosis- none, clubbing, none, atrophy- none, strength- nl Neuro- grossly intact to observation

## 2014-06-02 NOTE — Patient Instructions (Signed)
I'm really glad you are doing better. Come back if I can help.

## 2014-06-09 DIAGNOSIS — R3989 Other symptoms and signs involving the genitourinary system: Secondary | ICD-10-CM | POA: Diagnosis not present

## 2014-06-09 DIAGNOSIS — N390 Urinary tract infection, site not specified: Secondary | ICD-10-CM | POA: Diagnosis not present

## 2014-06-13 DIAGNOSIS — N201 Calculus of ureter: Secondary | ICD-10-CM | POA: Diagnosis not present

## 2014-06-13 DIAGNOSIS — N200 Calculus of kidney: Secondary | ICD-10-CM | POA: Diagnosis not present

## 2014-06-13 DIAGNOSIS — R300 Dysuria: Secondary | ICD-10-CM | POA: Diagnosis not present

## 2014-06-13 DIAGNOSIS — R82998 Other abnormal findings in urine: Secondary | ICD-10-CM | POA: Diagnosis not present

## 2014-06-15 DIAGNOSIS — R51 Headache: Secondary | ICD-10-CM | POA: Diagnosis not present

## 2014-06-17 DIAGNOSIS — H20021 Recurrent acute iridocyclitis, right eye: Secondary | ICD-10-CM | POA: Diagnosis not present

## 2014-06-17 DIAGNOSIS — H1089 Other conjunctivitis: Secondary | ICD-10-CM | POA: Diagnosis not present

## 2014-06-24 ENCOUNTER — Other Ambulatory Visit: Payer: Self-pay | Admitting: Family Medicine

## 2014-06-24 DIAGNOSIS — N200 Calculus of kidney: Secondary | ICD-10-CM | POA: Diagnosis not present

## 2014-06-24 DIAGNOSIS — R312 Other microscopic hematuria: Secondary | ICD-10-CM | POA: Diagnosis not present

## 2014-06-24 DIAGNOSIS — N6012 Diffuse cystic mastopathy of left breast: Secondary | ICD-10-CM

## 2014-06-24 DIAGNOSIS — N201 Calculus of ureter: Secondary | ICD-10-CM | POA: Diagnosis not present

## 2014-06-24 DIAGNOSIS — N390 Urinary tract infection, site not specified: Secondary | ICD-10-CM | POA: Diagnosis not present

## 2014-06-27 DIAGNOSIS — N200 Calculus of kidney: Secondary | ICD-10-CM | POA: Diagnosis not present

## 2014-06-27 DIAGNOSIS — E213 Hyperparathyroidism, unspecified: Secondary | ICD-10-CM | POA: Diagnosis not present

## 2014-06-27 DIAGNOSIS — N390 Urinary tract infection, site not specified: Secondary | ICD-10-CM | POA: Diagnosis not present

## 2014-06-27 DIAGNOSIS — N952 Postmenopausal atrophic vaginitis: Secondary | ICD-10-CM | POA: Diagnosis not present

## 2014-06-27 DIAGNOSIS — R300 Dysuria: Secondary | ICD-10-CM | POA: Diagnosis not present

## 2014-07-04 ENCOUNTER — Ambulatory Visit
Admission: RE | Admit: 2014-07-04 | Discharge: 2014-07-04 | Disposition: A | Discharge status: Home / Self Care | Payer: Medicare Other | Source: Ambulatory Visit | Attending: Family Medicine | Admitting: Family Medicine

## 2014-07-04 DIAGNOSIS — N6012 Diffuse cystic mastopathy of left breast: Secondary | ICD-10-CM | POA: Diagnosis not present

## 2014-07-04 DIAGNOSIS — Z23 Encounter for immunization: Secondary | ICD-10-CM | POA: Diagnosis not present

## 2014-07-04 DIAGNOSIS — R921 Mammographic calcification found on diagnostic imaging of breast: Secondary | ICD-10-CM | POA: Diagnosis not present

## 2014-07-12 DIAGNOSIS — M222X1 Patellofemoral disorders, right knee: Secondary | ICD-10-CM | POA: Diagnosis not present

## 2014-07-12 DIAGNOSIS — M25561 Pain in right knee: Secondary | ICD-10-CM | POA: Diagnosis not present

## 2014-07-12 DIAGNOSIS — M1990 Unspecified osteoarthritis, unspecified site: Secondary | ICD-10-CM | POA: Diagnosis not present

## 2014-07-12 DIAGNOSIS — M79642 Pain in left hand: Secondary | ICD-10-CM | POA: Diagnosis not present

## 2014-07-12 DIAGNOSIS — M79641 Pain in right hand: Secondary | ICD-10-CM | POA: Diagnosis not present

## 2014-07-12 DIAGNOSIS — M25562 Pain in left knee: Secondary | ICD-10-CM | POA: Diagnosis not present

## 2014-07-18 DIAGNOSIS — H9209 Otalgia, unspecified ear: Secondary | ICD-10-CM | POA: Diagnosis not present

## 2014-07-18 DIAGNOSIS — H6121 Impacted cerumen, right ear: Secondary | ICD-10-CM | POA: Diagnosis not present

## 2014-08-09 DIAGNOSIS — Z23 Encounter for immunization: Secondary | ICD-10-CM | POA: Diagnosis not present

## 2014-08-09 DIAGNOSIS — L821 Other seborrheic keratosis: Secondary | ICD-10-CM | POA: Diagnosis not present

## 2014-08-09 DIAGNOSIS — M791 Myalgia: Secondary | ICD-10-CM | POA: Diagnosis not present

## 2014-08-09 DIAGNOSIS — F322 Major depressive disorder, single episode, severe without psychotic features: Secondary | ICD-10-CM | POA: Diagnosis not present

## 2014-08-09 DIAGNOSIS — I129 Hypertensive chronic kidney disease with stage 1 through stage 4 chronic kidney disease, or unspecified chronic kidney disease: Secondary | ICD-10-CM | POA: Diagnosis not present

## 2014-08-09 DIAGNOSIS — F411 Generalized anxiety disorder: Secondary | ICD-10-CM | POA: Diagnosis not present

## 2014-08-09 DIAGNOSIS — N390 Urinary tract infection, site not specified: Secondary | ICD-10-CM | POA: Diagnosis not present

## 2014-08-26 DIAGNOSIS — J029 Acute pharyngitis, unspecified: Secondary | ICD-10-CM | POA: Diagnosis not present

## 2014-08-26 DIAGNOSIS — J329 Chronic sinusitis, unspecified: Secondary | ICD-10-CM | POA: Diagnosis not present

## 2014-09-07 DIAGNOSIS — M542 Cervicalgia: Secondary | ICD-10-CM | POA: Diagnosis not present

## 2014-09-19 DIAGNOSIS — J209 Acute bronchitis, unspecified: Secondary | ICD-10-CM | POA: Diagnosis not present

## 2014-09-20 DIAGNOSIS — H26493 Other secondary cataract, bilateral: Secondary | ICD-10-CM | POA: Diagnosis not present

## 2014-09-20 DIAGNOSIS — H04123 Dry eye syndrome of bilateral lacrimal glands: Secondary | ICD-10-CM | POA: Diagnosis not present

## 2014-09-28 ENCOUNTER — Other Ambulatory Visit: Payer: Self-pay | Admitting: Dermatology

## 2014-09-28 DIAGNOSIS — Z85828 Personal history of other malignant neoplasm of skin: Secondary | ICD-10-CM | POA: Diagnosis not present

## 2014-09-28 DIAGNOSIS — L570 Actinic keratosis: Secondary | ICD-10-CM | POA: Diagnosis not present

## 2014-09-28 DIAGNOSIS — C44712 Basal cell carcinoma of skin of right lower limb, including hip: Secondary | ICD-10-CM | POA: Diagnosis not present

## 2014-09-28 DIAGNOSIS — L821 Other seborrheic keratosis: Secondary | ICD-10-CM | POA: Diagnosis not present

## 2014-09-28 DIAGNOSIS — D225 Melanocytic nevi of trunk: Secondary | ICD-10-CM | POA: Diagnosis not present

## 2014-09-28 DIAGNOSIS — L814 Other melanin hyperpigmentation: Secondary | ICD-10-CM | POA: Diagnosis not present

## 2014-10-21 DIAGNOSIS — M1990 Unspecified osteoarthritis, unspecified site: Secondary | ICD-10-CM | POA: Diagnosis not present

## 2014-10-21 DIAGNOSIS — E780 Pure hypercholesterolemia: Secondary | ICD-10-CM | POA: Diagnosis not present

## 2014-10-21 DIAGNOSIS — M791 Myalgia: Secondary | ICD-10-CM | POA: Diagnosis not present

## 2014-10-21 DIAGNOSIS — Z8673 Personal history of transient ischemic attack (TIA), and cerebral infarction without residual deficits: Secondary | ICD-10-CM | POA: Diagnosis not present

## 2014-10-21 DIAGNOSIS — R1013 Epigastric pain: Secondary | ICD-10-CM | POA: Diagnosis not present

## 2014-10-21 DIAGNOSIS — Z0000 Encounter for general adult medical examination without abnormal findings: Secondary | ICD-10-CM | POA: Diagnosis not present

## 2014-10-21 DIAGNOSIS — F411 Generalized anxiety disorder: Secondary | ICD-10-CM | POA: Diagnosis not present

## 2014-10-21 DIAGNOSIS — F322 Major depressive disorder, single episode, severe without psychotic features: Secondary | ICD-10-CM | POA: Diagnosis not present

## 2014-10-21 DIAGNOSIS — N182 Chronic kidney disease, stage 2 (mild): Secondary | ICD-10-CM | POA: Diagnosis not present

## 2014-10-21 DIAGNOSIS — M8580 Other specified disorders of bone density and structure, unspecified site: Secondary | ICD-10-CM | POA: Diagnosis not present

## 2014-10-21 DIAGNOSIS — I129 Hypertensive chronic kidney disease with stage 1 through stage 4 chronic kidney disease, or unspecified chronic kidney disease: Secondary | ICD-10-CM | POA: Diagnosis not present

## 2014-10-21 DIAGNOSIS — G4700 Insomnia, unspecified: Secondary | ICD-10-CM | POA: Diagnosis not present

## 2014-10-25 ENCOUNTER — Other Ambulatory Visit: Payer: Self-pay | Admitting: Family Medicine

## 2014-10-25 DIAGNOSIS — R1013 Epigastric pain: Secondary | ICD-10-CM

## 2014-10-26 ENCOUNTER — Ambulatory Visit
Admission: RE | Admit: 2014-10-26 | Discharge: 2014-10-26 | Disposition: A | Discharge status: Home / Self Care | Payer: Medicare Other | Source: Ambulatory Visit | Attending: Family Medicine | Admitting: Family Medicine

## 2014-10-26 DIAGNOSIS — R1013 Epigastric pain: Secondary | ICD-10-CM

## 2014-10-26 DIAGNOSIS — N281 Cyst of kidney, acquired: Secondary | ICD-10-CM | POA: Diagnosis not present

## 2014-10-26 DIAGNOSIS — Z9049 Acquired absence of other specified parts of digestive tract: Secondary | ICD-10-CM | POA: Diagnosis not present

## 2014-10-26 DIAGNOSIS — N200 Calculus of kidney: Secondary | ICD-10-CM | POA: Diagnosis not present

## 2014-10-27 DIAGNOSIS — J440 Chronic obstructive pulmonary disease with acute lower respiratory infection: Secondary | ICD-10-CM | POA: Diagnosis not present

## 2014-11-11 DIAGNOSIS — H1851 Endothelial corneal dystrophy: Secondary | ICD-10-CM | POA: Diagnosis not present

## 2014-11-11 DIAGNOSIS — H5111 Convergence insufficiency: Secondary | ICD-10-CM | POA: Diagnosis not present

## 2014-11-30 DIAGNOSIS — R51 Headache: Secondary | ICD-10-CM | POA: Diagnosis not present

## 2014-11-30 DIAGNOSIS — H40023 Open angle with borderline findings, high risk, bilateral: Secondary | ICD-10-CM | POA: Diagnosis not present

## 2014-11-30 DIAGNOSIS — H40011 Open angle with borderline findings, low risk, right eye: Secondary | ICD-10-CM | POA: Diagnosis not present

## 2014-12-01 DIAGNOSIS — M859 Disorder of bone density and structure, unspecified: Secondary | ICD-10-CM | POA: Diagnosis not present

## 2014-12-01 DIAGNOSIS — M8580 Other specified disorders of bone density and structure, unspecified site: Secondary | ICD-10-CM | POA: Diagnosis not present

## 2014-12-13 ENCOUNTER — Ambulatory Visit (INDEPENDENT_AMBULATORY_CARE_PROVIDER_SITE_OTHER): Payer: Medicare Other | Admitting: Neurology

## 2014-12-13 ENCOUNTER — Encounter: Payer: Self-pay | Admitting: Neurology

## 2014-12-13 VITALS — BP 174/83 | HR 89 | Ht 61.0 in | Wt 149.4 lb

## 2014-12-13 DIAGNOSIS — R519 Headache, unspecified: Secondary | ICD-10-CM

## 2014-12-13 DIAGNOSIS — R51 Headache: Secondary | ICD-10-CM

## 2014-12-13 HISTORY — DX: Headache, unspecified: R51.9

## 2014-12-13 MED ORDER — TOPIRAMATE 25 MG PO TABS: ORAL_TABLET | ORAL | Status: DC

## 2014-12-13 NOTE — Progress Notes (Signed)
Reason for visit: Headache  Referring physician: Dr. Rolm Baptise is a 74 y.o. female  History of present illness:  Ms. Rynders is a 74 year old right-handed white female with a history of headaches that dates back about 3 or 4 years. The patient is sent through her ophthalmologist to this office for evaluation of her headaches. She indicates that she had MRI evaluation of the orbits and sinuses in 2013, the scan was brought for my review. This does not show any significant sinus disease, but the evaluation of the brain was incomplete. The patient indicates that the headaches are generally around the right temporal area, and going to the right jaw and ear. The patient may have a throbbing sensation with the headache, associated with photophobia, no phonophobia. The patient states that the headaches may last anywhere from 2 hours to half of a day. She may have some nausea and vomiting with severe headaches. She has some blurring of vision, no loss of vision. She has diffuse neuromuscular pain in the neck, back, hips, thighs, and shoulders. The patient reports some tinnitus in the right ear. She has been to her dentist, and she was told she had TMJ problems, but this was never confirmed. She gives a history of frequent herpetic infections, and she had shingles around the right eye at one point. She describes a headache pain as a sharp pain at times, again with a throbbing quality. She will have 4 or 5 such headaches a month. She indicates that wearing her glasses, and stress may bring on headaches. She denies any history of headaches when she was younger. She is sent to this office for further evaluation. She indicates that she was placed on a migraine medication by her primary care physician, she cannot remember the name of this medication. She indicates that this does help the headache when she takes it. She has a history of a TIA event 2 years ago associated with transient aphasia.  The patient appears to have significant problems with word finding currently.  Past Medical History  Diagnosis Date  . Chronic rhinosinusitis   . Arthritis     back and neck  . Depression   . IBS (irritable bowel syndrome)   . History of kidney stones     MULTIBLE SURGICAL INTERVENTIONS  . Diverticulosis of colon   . Mild asthma     COLD INDUCED  . DDD (degenerative disc disease)   . Spondylosis   . Frequency of urination   . History of skin cancer HX TOPICAL FACIAL [place] 2013  . Seasonal and perennial allergic rhinitis   . History of TIA (transient ischemic attack)     NOV 2013-- NO RESIDUAL  . Right ureteral stone   . Carotid artery stenosis   . Hypertension   . Shingles     EYEBALL 09-17-2013  . Glaucoma   . Headache disorder 12/13/2014    Right temple    Past Surgical History  Procedure Laterality Date  . [place] luc      sinus drainage procedure  . Tonsilectomy, adenoidectomy, bilateral myringotomy and tubes  CHILD  . Bunionectomy      BILATERAL  . Extracorporeal shock wave lithotripsy  about 1997/ Dr Reece Agar    Never completed procedure-could not tolerate pain. Had seizures due to pain  . Right ureteroscopic stone extraction  LAST ONE 02-21-2011    MULTIPLE SURGICAL STONE EXTRACTIONS  . Left ureteroscopic stone extraction    LAST ONE 04-28-2009  SEVERAL SURGICAL STONE EXTRACTIONS  . Cysto/ bilateral ureteroscopy/  laser of stones left side  06-02-2007  . Laparoscopic cholecystectomy  01-12-2005  . Cysto/ bilateral ureteroscopic stone extractions  02-09-2003  . Lumbar laminectomy  09-30-2002    L4 - 5;  SPONDYLOLISTHISES W/ STENOSIS AND RADICULOPATHY  . Percutaneous nephrostolithotomy  1975  . Eye surgery  AGE 79    INJURY  . Cystoscopy/retrograde/ureteroscopy/stone extraction with basket  03/31/2012    Procedure: CYSTOSCOPY/RETROGRADE/URETEROSCOPY/STONE EXTRACTION WITH BASKET;  Surgeon: Malka So, MD;  Location: [facility];  Service:  Urology;  Laterality: Right;   . Vaginal hysterectomy  [place]  . Cystoscopy/retrograde/ureteroscopy/stone extraction with basket Right 03/29/2013    Procedure: CYSTOSCOPY//URETEROSCOPY/STONE EXTRACTION WITH BASKET;  Surgeon: Malka So, MD;  Location: [facility];  Service: Urology;  Laterality: Right;  . Hernia repair  AGE 61  . Cystoscopy/retrograde/ureteroscopy/stone extraction with basket Right 10/14/2013    Procedure: CYSTOSCOPY/RETROGRADE/URETEROSCOPY/STONE EXTRACTION WITH BASKET;  Surgeon: Irine Seal, MD;  Location: [facility];  Service: Urology;  Laterality: Right;  . Holmium laser application Right 2/99/2426    Procedure: HOLMIUM LASER APPLICATION;  Surgeon: Irine Seal, MD;  Location: [facility];  Service: Urology;  Laterality: Right;    Family History  Problem Relation Age of Onset  . COPD Mother   . Breast cancer Mother   . Colon cancer Neg Hx   . Migraines Neg Hx     Social history:  reports that she quit smoking about 29 years ago. Her smoking use included Cigarettes. She quit after 23 years of use. She has never used smokeless tobacco. She reports that she drinks alcohol. She reports that she does not use illicit drugs.  Medications:  Prior to Admission medications   Medication Sig Start Date End Date Taking? Authorizing Provider  aliskiren (TEKTURNA) 150 MG tablet Take 150 mg by mouth every evening.    Yes Historical Provider, MD  aspirin EC 81 MG tablet Take 162 mg by mouth 3 (three) times a week.   Yes Historical Provider, MD  celecoxib (CELEBREX) 200 MG capsule Take 200 mg by mouth daily. 09/07/14  Yes Historical Provider, MD  desipramine (NORPRAMIN) 25 MG tablet Take 25 mg by mouth daily.    Yes Historical Provider, MD  Docusate Calcium (STOOL SOFTENER PO) Take by mouth as needed (constipation).    Yes Historical Provider, MD  doxycycline (VIBRA-TABS) 100 MG tablet Take 100 mg by mouth daily. 12/25/12  Yes  Thayer Headings, MD  estradiol (ESTRACE) 1 MG tablet Take 1 mg by mouth daily.   Yes Historical Provider, MD  fish oil-omega-3 fatty acids 1000 MG capsule Take 2 g by mouth daily.    Yes Historical Provider, MD  HYDROcodone-acetaminophen (NORCO/VICODIN) 5-325 MG per tablet Take 1 tablet by mouth every 4 (four) hours as needed for pain. For pain 03/29/13  Yes Irine Seal, MD  Multiple Vitamin (MULTIVITAMIN PO) Take by mouth daily.    Yes Historical Provider, MD  phenazopyridine (PYRIDIUM) 200 MG tablet Take 1 tablet (200 mg total) by mouth 3 (three) times daily as needed for pain. 10/14/13  Yes Irine Seal, MD  promethazine (PHENERGAN) 25 MG tablet Take 1 tablet (25 mg total) by mouth every 6 (six) hours as needed for nausea or vomiting. 10/14/13  Yes Irine Seal, MD  rosuvastatin (CRESTOR) 5 MG tablet Take 5 mg by mouth every evening.   Yes Historical Provider, MD  valACYclovir (VALTREX)  1000 MG tablet Take 1,000 mg by mouth 2 (two) times daily.   Yes Historical Provider, MD     No Known Allergies  ROS:  Out of a complete 14 system review of symptoms, the patient complains only of the following symptoms, and all other reviewed systems are negative.  Eye pain Joint pain, achy muscles Headache Anxiety  Blood pressure 174/83, pulse 89, height 5\' 1"  (1.549 m), weight 149 lb 6.4 oz (67.767 kg).  Physical Exam  General: The patient is alert and cooperative at the time of the examination.  Eyes: Pupils are equal, round, and reactive to light. Discs are flat bilaterally.  Neck: The neck is supple, no carotid bruits are noted.  Respiratory: The respiratory examination is clear.  Cardiovascular: The cardiovascular examination reveals a regular rate and rhythm, no obvious murmurs or rubs are noted.  Neuromuscular: The patient lacks about 15 of lateral rotation of the cervical spine bilaterally. No crepitus is noted in the temporomandibular joints.  Skin: Extremities are without significant  edema.  Neurologic Exam  Mental status: The patient is alert and oriented x 3 at the time of the examination. The patient has apparent normal recent and remote memory, with an apparently normal attention span and concentration ability.  Cranial nerves: Facial symmetry is present. There is good sensation of the face to pinprick and soft touch bilaterally. The strength of the facial muscles and the muscles to head turning and shoulder shrug are normal bilaterally. Speech is well enunciated, no aphasia or dysarthria is noted. The patient does appear to have some word finding difficulties. Extraocular movements are full. Visual fields are full. The tongue is midline, and the patient has symmetric elevation of the soft palate. No obvious hearing deficits are noted.  Motor: The motor testing reveals 5 over 5 strength of all 4 extremities. Good symmetric motor tone is noted throughout.  Sensory: Sensory testing is intact to pinprick, soft touch, vibration sensation, and position sense on all 4 extremities. No evidence of extinction is noted.  Coordination: Cerebellar testing reveals good finger-nose-finger and heel-to-shin bilaterally.  Gait and station: Gait is normal. Tandem gait is normal. Romberg is negative. No drift is seen.  Reflexes: Deep tendon reflexes are symmetric and normal bilaterally. Toes are downgoing bilaterally.   Assessment/Plan:  1. Right temporal headache  The patient appears to have frequent headaches involving the right temporal area, going down in the jaw. The patient reports a throbbing sensation, some blurring of vision, occasional nausea and vomiting, and photophobia with the headache. The patient indicates that a recent medication she was given for migraine helps the headache. The patient could in fact have migraine headache, and she will be placed on Topamax as a prophylactic medication. She will have MRI evaluation of the brain. She will follow-up through this office in  about 3-4 months.  Jill Alexanders MD 12/13/2014 8:19 PM  [facility] [address] [place], [place] [address]  Phone [phone] Fax [phone]

## 2014-12-13 NOTE — Patient Instructions (Signed)

## 2014-12-21 DIAGNOSIS — Z961 Presence of intraocular lens: Secondary | ICD-10-CM | POA: Diagnosis not present

## 2014-12-21 DIAGNOSIS — H40013 Open angle with borderline findings, low risk, bilateral: Secondary | ICD-10-CM | POA: Diagnosis not present

## 2014-12-21 DIAGNOSIS — H16223 Keratoconjunctivitis sicca, not specified as Sjogren's, bilateral: Secondary | ICD-10-CM | POA: Diagnosis not present

## 2014-12-21 DIAGNOSIS — H26491 Other secondary cataract, right eye: Secondary | ICD-10-CM | POA: Diagnosis not present

## 2014-12-22 DIAGNOSIS — N390 Urinary tract infection, site not specified: Secondary | ICD-10-CM | POA: Diagnosis not present

## 2014-12-22 DIAGNOSIS — R312 Other microscopic hematuria: Secondary | ICD-10-CM | POA: Diagnosis not present

## 2014-12-27 ENCOUNTER — Other Ambulatory Visit: Payer: Medicare Other

## 2014-12-28 ENCOUNTER — Ambulatory Visit
Admission: RE | Admit: 2014-12-28 | Discharge: 2014-12-28 | Disposition: A | Discharge status: Home / Self Care | Payer: Medicare Other | Source: Ambulatory Visit | Attending: Neurology | Admitting: Neurology

## 2014-12-28 DIAGNOSIS — R51 Headache: Secondary | ICD-10-CM | POA: Diagnosis not present

## 2014-12-28 DIAGNOSIS — R519 Headache, unspecified: Secondary | ICD-10-CM

## 2014-12-30 ENCOUNTER — Telehealth: Payer: Self-pay | Admitting: Neurology

## 2014-12-30 NOTE — Telephone Encounter (Signed)
I called the patient. MRI of the brain shows chronic small vessel disease. Compared to study done in 2013, there is clear progression of the small vessel changes. The patient also has microhemorrhages that may be related to hypertension or amyloid angiopathy. The patient is on low-dose aspirin given the history of a TIA previously. When seen in our office, her blood pressures were running in the 500B systolic, and around 93 diastolic. The patient indicates that she has had years of very high blood pressures, and she has not been able to tolerate many blood pressure medications. I have indicated that adequate control of the blood pressure is of a primary concern to slow down progression of white matter disease. The patient will be seeing her primary care physician within the next several weeks. More aggressive blood pressure control is needed.   MRI brain 12/29/2014:  IMPRESSION:  Abnormal MRI brain (without) demonstrating: 1. Mild scattered periventricular and subcortical foci of chronic small vessel ischemic disease. 2. Numerous chronic cerebral microhemorrhages in the bilateral hemispheres. May be related to chronic small vessel ischemic disease or amyloid angiopathy.  3. Mild pannus formation posterior to the odontoid process with mild kinking of the cervicomedullary junction. 4. Compared to MRI on 07/10/12, there has been progression of white matter disease.

## 2015-01-04 DIAGNOSIS — H26491 Other secondary cataract, right eye: Secondary | ICD-10-CM | POA: Diagnosis not present

## 2015-01-04 DIAGNOSIS — H16223 Keratoconjunctivitis sicca, not specified as Sjogren's, bilateral: Secondary | ICD-10-CM | POA: Diagnosis not present

## 2015-01-04 DIAGNOSIS — H40013 Open angle with borderline findings, low risk, bilateral: Secondary | ICD-10-CM | POA: Diagnosis not present

## 2015-01-04 DIAGNOSIS — Z961 Presence of intraocular lens: Secondary | ICD-10-CM | POA: Diagnosis not present

## 2015-01-10 DIAGNOSIS — K5900 Constipation, unspecified: Secondary | ICD-10-CM | POA: Diagnosis not present

## 2015-01-10 DIAGNOSIS — R1084 Generalized abdominal pain: Secondary | ICD-10-CM | POA: Diagnosis not present

## 2015-01-16 DIAGNOSIS — Z86010 Personal history of colonic polyps: Secondary | ICD-10-CM | POA: Diagnosis not present

## 2015-01-16 DIAGNOSIS — K5790 Diverticulosis of intestine, part unspecified, without perforation or abscess without bleeding: Secondary | ICD-10-CM | POA: Diagnosis not present

## 2015-01-16 DIAGNOSIS — R194 Change in bowel habit: Secondary | ICD-10-CM | POA: Diagnosis not present

## 2015-01-27 ENCOUNTER — Other Ambulatory Visit: Payer: Self-pay | Admitting: Gastroenterology

## 2015-01-27 DIAGNOSIS — D126 Benign neoplasm of colon, unspecified: Secondary | ICD-10-CM | POA: Diagnosis not present

## 2015-01-27 DIAGNOSIS — D122 Benign neoplasm of ascending colon: Secondary | ICD-10-CM | POA: Diagnosis not present

## 2015-01-27 DIAGNOSIS — Z09 Encounter for follow-up examination after completed treatment for conditions other than malignant neoplasm: Secondary | ICD-10-CM | POA: Diagnosis not present

## 2015-01-27 DIAGNOSIS — Z86010 Personal history of colonic polyps: Secondary | ICD-10-CM | POA: Diagnosis not present

## 2015-01-27 DIAGNOSIS — D123 Benign neoplasm of transverse colon: Secondary | ICD-10-CM | POA: Diagnosis not present

## 2015-01-27 DIAGNOSIS — K5730 Diverticulosis of large intestine without perforation or abscess without bleeding: Secondary | ICD-10-CM | POA: Diagnosis not present

## 2015-02-09 ENCOUNTER — Other Ambulatory Visit: Payer: Self-pay | Admitting: Family Medicine

## 2015-02-09 DIAGNOSIS — I129 Hypertensive chronic kidney disease with stage 1 through stage 4 chronic kidney disease, or unspecified chronic kidney disease: Secondary | ICD-10-CM | POA: Diagnosis not present

## 2015-02-09 DIAGNOSIS — G43909 Migraine, unspecified, not intractable, without status migrainosus: Secondary | ICD-10-CM | POA: Diagnosis not present

## 2015-02-09 DIAGNOSIS — R1013 Epigastric pain: Secondary | ICD-10-CM

## 2015-02-09 DIAGNOSIS — R413 Other amnesia: Secondary | ICD-10-CM | POA: Diagnosis not present

## 2015-02-09 DIAGNOSIS — B001 Herpesviral vesicular dermatitis: Secondary | ICD-10-CM | POA: Diagnosis not present

## 2015-02-20 ENCOUNTER — Ambulatory Visit
Admission: RE | Admit: 2015-02-20 | Discharge: 2015-02-20 | Disposition: A | Discharge status: Home / Self Care | Payer: Medicare Other | Source: Ambulatory Visit | Attending: Family Medicine | Admitting: Family Medicine

## 2015-02-20 DIAGNOSIS — R1013 Epigastric pain: Secondary | ICD-10-CM

## 2015-02-20 DIAGNOSIS — K769 Liver disease, unspecified: Secondary | ICD-10-CM | POA: Diagnosis not present

## 2015-02-20 DIAGNOSIS — N200 Calculus of kidney: Secondary | ICD-10-CM | POA: Diagnosis not present

## 2015-02-20 DIAGNOSIS — Z9049 Acquired absence of other specified parts of digestive tract: Secondary | ICD-10-CM | POA: Diagnosis not present

## 2015-03-02 ENCOUNTER — Other Ambulatory Visit: Payer: Self-pay | Admitting: Family Medicine

## 2015-03-02 DIAGNOSIS — R1013 Epigastric pain: Secondary | ICD-10-CM

## 2015-03-03 ENCOUNTER — Ambulatory Visit
Admission: RE | Admit: 2015-03-03 | Discharge: 2015-03-03 | Disposition: A | Discharge status: Home / Self Care | Payer: Medicare Other | Source: Ambulatory Visit | Attending: Family Medicine | Admitting: Family Medicine

## 2015-03-03 DIAGNOSIS — R1013 Epigastric pain: Secondary | ICD-10-CM | POA: Diagnosis not present

## 2015-03-03 DIAGNOSIS — N200 Calculus of kidney: Secondary | ICD-10-CM | POA: Diagnosis not present

## 2015-03-03 DIAGNOSIS — N281 Cyst of kidney, acquired: Secondary | ICD-10-CM | POA: Diagnosis not present

## 2015-03-03 DIAGNOSIS — Z90710 Acquired absence of both cervix and uterus: Secondary | ICD-10-CM | POA: Diagnosis not present

## 2015-03-03 MED ORDER — IOPAMIDOL (ISOVUE-300) INJECTION 61%
100.0000 mL | Freq: Once | INTRAVENOUS | Status: AC | PRN
  Administered 2015-03-03: 100 mL via INTRAVENOUS

## 2015-03-29 DIAGNOSIS — L570 Actinic keratosis: Secondary | ICD-10-CM | POA: Diagnosis not present

## 2015-03-29 DIAGNOSIS — L821 Other seborrheic keratosis: Secondary | ICD-10-CM | POA: Diagnosis not present

## 2015-03-29 DIAGNOSIS — Z85828 Personal history of other malignant neoplasm of skin: Secondary | ICD-10-CM | POA: Diagnosis not present

## 2015-03-31 ENCOUNTER — Other Ambulatory Visit: Payer: Self-pay | Admitting: Family Medicine

## 2015-03-31 ENCOUNTER — Ambulatory Visit
Admission: RE | Admit: 2015-03-31 | Discharge: 2015-03-31 | Disposition: A | Discharge status: Home / Self Care | Payer: Medicare Other | Source: Ambulatory Visit | Attending: Family Medicine | Admitting: Family Medicine

## 2015-03-31 DIAGNOSIS — J449 Chronic obstructive pulmonary disease, unspecified: Secondary | ICD-10-CM | POA: Diagnosis not present

## 2015-03-31 DIAGNOSIS — E780 Pure hypercholesterolemia: Secondary | ICD-10-CM | POA: Diagnosis not present

## 2015-03-31 DIAGNOSIS — K589 Irritable bowel syndrome without diarrhea: Secondary | ICD-10-CM | POA: Diagnosis not present

## 2015-03-31 DIAGNOSIS — R1013 Epigastric pain: Secondary | ICD-10-CM | POA: Diagnosis not present

## 2015-03-31 DIAGNOSIS — R634 Abnormal weight loss: Secondary | ICD-10-CM

## 2015-03-31 DIAGNOSIS — I129 Hypertensive chronic kidney disease with stage 1 through stage 4 chronic kidney disease, or unspecified chronic kidney disease: Secondary | ICD-10-CM | POA: Diagnosis not present

## 2015-03-31 DIAGNOSIS — R079 Chest pain, unspecified: Secondary | ICD-10-CM | POA: Diagnosis not present

## 2015-03-31 DIAGNOSIS — Z0000 Encounter for general adult medical examination without abnormal findings: Secondary | ICD-10-CM | POA: Diagnosis not present

## 2015-03-31 DIAGNOSIS — Z87891 Personal history of nicotine dependence: Secondary | ICD-10-CM | POA: Diagnosis not present

## 2015-04-06 ENCOUNTER — Encounter (HOSPITAL_COMMUNITY): Payer: Self-pay | Admitting: *Deleted

## 2015-04-06 ENCOUNTER — Other Ambulatory Visit: Payer: Self-pay

## 2015-04-06 ENCOUNTER — Other Ambulatory Visit: Payer: Self-pay | Admitting: Gastroenterology

## 2015-04-06 ENCOUNTER — Other Ambulatory Visit: Payer: Self-pay | Admitting: Family Medicine

## 2015-04-06 DIAGNOSIS — N644 Mastodynia: Secondary | ICD-10-CM

## 2015-04-06 NOTE — Addendum Note (Signed)
Addended by: Arta Silence on: 04/06/2015 09:05 AM   Modules accepted: Orders

## 2015-04-12 ENCOUNTER — Ambulatory Visit
Admission: RE | Admit: 2015-04-12 | Discharge: 2015-04-12 | Disposition: A | Discharge status: Home / Self Care | Payer: Medicare Other | Source: Ambulatory Visit | Attending: Family Medicine | Admitting: Family Medicine

## 2015-04-12 ENCOUNTER — Encounter (HOSPITAL_COMMUNITY)
Admission: RE | Disposition: A | Discharge status: Home / Self Care | Payer: Self-pay | Source: Ambulatory Visit | Attending: Gastroenterology

## 2015-04-12 ENCOUNTER — Ambulatory Visit (HOSPITAL_COMMUNITY)
Admission: RE | Admit: 2015-04-12 | Discharge: 2015-04-12 | Disposition: A | Discharge status: Home / Self Care | Payer: Medicare Other | Source: Ambulatory Visit | Attending: Gastroenterology | Admitting: Gastroenterology

## 2015-04-12 ENCOUNTER — Ambulatory Visit (HOSPITAL_COMMUNITY): Payer: Medicare Other | Admitting: Anesthesiology

## 2015-04-12 ENCOUNTER — Encounter (HOSPITAL_COMMUNITY): Payer: Self-pay | Admitting: *Deleted

## 2015-04-12 DIAGNOSIS — I10 Essential (primary) hypertension: Secondary | ICD-10-CM | POA: Insufficient documentation

## 2015-04-12 DIAGNOSIS — K449 Diaphragmatic hernia without obstruction or gangrene: Secondary | ICD-10-CM | POA: Insufficient documentation

## 2015-04-12 DIAGNOSIS — M1990 Unspecified osteoarthritis, unspecified site: Secondary | ICD-10-CM | POA: Insufficient documentation

## 2015-04-12 DIAGNOSIS — J45909 Unspecified asthma, uncomplicated: Secondary | ICD-10-CM | POA: Insufficient documentation

## 2015-04-12 DIAGNOSIS — Z9049 Acquired absence of other specified parts of digestive tract: Secondary | ICD-10-CM | POA: Diagnosis not present

## 2015-04-12 DIAGNOSIS — N644 Mastodynia: Secondary | ICD-10-CM

## 2015-04-12 DIAGNOSIS — I739 Peripheral vascular disease, unspecified: Secondary | ICD-10-CM | POA: Insufficient documentation

## 2015-04-12 DIAGNOSIS — Z8673 Personal history of transient ischemic attack (TIA), and cerebral infarction without residual deficits: Secondary | ICD-10-CM | POA: Diagnosis not present

## 2015-04-12 DIAGNOSIS — Z79899 Other long term (current) drug therapy: Secondary | ICD-10-CM | POA: Diagnosis not present

## 2015-04-12 DIAGNOSIS — Z87891 Personal history of nicotine dependence: Secondary | ICD-10-CM | POA: Insufficient documentation

## 2015-04-12 DIAGNOSIS — R1013 Epigastric pain: Secondary | ICD-10-CM | POA: Diagnosis present

## 2015-04-12 HISTORY — PX: ESOPHAGOGASTRODUODENOSCOPY (EGD) WITH PROPOFOL: SHX5813

## 2015-04-12 HISTORY — PX: EUS: SHX5427

## 2015-04-12 SURGERY — ESOPHAGOGASTRODUODENOSCOPY (EGD) WITH PROPOFOL
Anesthesia: Monitor Anesthesia Care

## 2015-04-12 MED ORDER — SODIUM CHLORIDE 0.9 % IV SOLN: INTRAVENOUS | Status: DC

## 2015-04-12 MED ORDER — PROPOFOL 10 MG/ML IV BOLUS
INTRAVENOUS | Status: AC
  Filled 2015-04-12: qty 20

## 2015-04-12 MED ORDER — OMEPRAZOLE MAGNESIUM 20 MG PO TBEC: 20.0000 mg | DELAYED_RELEASE_TABLET | Freq: Every day | ORAL | Status: DC

## 2015-04-12 MED ORDER — PROPOFOL INFUSION 10 MG/ML OPTIME
INTRAVENOUS | Status: DC | PRN
  Administered 2015-04-12: 100 ug/kg/min via INTRAVENOUS

## 2015-04-12 MED ORDER — SODIUM CHLORIDE 0.9 % IV SOLN
INTRAVENOUS | Status: DC
Start: 2015-04-12 — End: 2015-04-12

## 2015-04-12 MED ORDER — LACTATED RINGERS IV SOLN
INTRAVENOUS | Status: DC
  Administered 2015-04-12: 10:00:00 via INTRAVENOUS

## 2015-04-12 MED ORDER — PROPOFOL 10 MG/ML IV BOLUS
INTRAVENOUS | Status: DC | PRN
  Administered 2015-04-12: 50 mg via INTRAVENOUS
  Administered 2015-04-12 (×3): 20 mg via INTRAVENOUS

## 2015-04-12 SURGICAL SUPPLY — 14 items

## 2015-04-12 NOTE — Transfer of Care (Signed)
Immediate Anesthesia Transfer of Care Note  Patient: Ashley [name]  Procedure(s) Performed: Procedure(s): ESOPHAGOGASTRODUODENOSCOPY (EGD) WITH PROPOFOL (N/A) ESOPHAGEAL ENDOSCOPIC ULTRASOUND (EUS) RADIAL (N/A)  Patient Location: PACU  Anesthesia Type:MAC  Level of Consciousness:  sedated, patient cooperative and responds to stimulation  Airway & Oxygen Therapy:Patient Spontanous Breathing and Patient connected to face mask oxgen  Post-op Assessment:  Report given to PACU RN and Post -op Vital signs reviewed and stable  Post vital signs:  Reviewed and stable  Last Vitals:  Filed Vitals:   04/12/15 0940  BP: 152/80  Pulse: 89  Temp: 36.4 C  Resp: 18    Complications: No apparent anesthesia complications

## 2015-04-12 NOTE — Op Note (Signed)
[facility] [place], [address]   ENDOSCOPIC ULTRASOUND PROCEDURE REPORT  PATIENT: Ashley [name], Ashley [name]  MR#: [id number] BIRTHDATE: 1941/05/17  GENDER: female ENDOSCOPIST: Arta Silence, MD REFERRED BY:  Mayra Neer, M.D. PROCEDURE DATE:  04/12/2015 PROCEDURE:   Upper EUS ASA CLASS:      Class III INDICATIONS:   1.  intermittent epigastric pain. MEDICATIONS: Monitored anesthesia care  DESCRIPTION OF PROCEDURE:   After the risks benefits and alternatives of the procedure were  explained, informed consent was obtained. The patient was then placed in the left, lateral, decubitus postion and IV sedation was administered. Throughout the procedure, the patients blood pressure, pulse and oxygen saturations were monitored continuously.  Under direct visualization, the radial forward-viewing  echoendoscope was introduced through the mouth  and advanced to the second portion of the duodenum .  Water was used as necessary to provide an acoustic interface. Estimated blood loss is zero unless otherwise noted in this procedure report. Upon completion of the imaging, water was removed and the patient was sent to the recovery room in satisfactory condition.    FINDINGS:      EGD:  Small hiatal hernia; otherwise normal endoscopy to the second portion of the duodenum. EUS:  Normal pancreas; specifically, no pancreatic mass, cyst or features of chronic pancreatitis were identified.  No peripancreatic adenopathy was seen.  Normal non-dilated bile duct; no bile duct wall thickening, mass, or choledocholithiasis was seen.  Post-cholecystectomy.  Ampulla normal via EUS.  IMPRESSION:     Small hiatal hernia, otherwise normal EGD/EUS.  No choledocholithiasis, which had been my principal concern.  Perhaps symptoms are related to significant intermittent nocturnal GERD.   RECOMMENDATIONS:     1.  Watch for potential complications of procedure. 2.  Trial of  omeprazole OTC 20 mg po qd (to take in late afternoon). 3.  Follow-up with [facility] in 6-8 weeks, sooner if symptoms worsen in the interval.   _______________________________ eSigned:  Arta Silence, MD 04/12/2015 11:23 AM   CC:

## 2015-04-12 NOTE — Anesthesia Preprocedure Evaluation (Addendum)
Anesthesia Evaluation  Patient identified by MRN, date of birth, ID band Patient awake    Reviewed: Allergy & Precautions, NPO status , Patient's Chart, lab work & pertinent test results  Airway Mallampati: II  TM Distance: >3 FB Neck ROM: Full    Dental   Pulmonary asthma , former smoker,  breath sounds clear to auscultation        Cardiovascular hypertension, Pt. on medications + Peripheral Vascular Disease Rhythm:Regular Rate:Normal     Neuro/Psych Depression TIAnegative neurological ROS     GI/Hepatic negative GI ROS, Neg liver ROS,   Endo/Other  negative endocrine ROS  Renal/GU Renal disease (hx renal stones)     Musculoskeletal  (+) Arthritis -,   Abdominal   Peds  Hematology negative hematology ROS (+)   Anesthesia Other Findings   Reproductive/Obstetrics                            Anesthesia Physical Anesthesia Plan  ASA: III  Anesthesia Plan: MAC   Post-op Pain Management:    Induction: Intravenous  Airway Management Planned: Natural Airway and Nasal Cannula  Additional Equipment:   Intra-op Plan:   Post-operative Plan:   Informed Consent: I have reviewed the patients History and Physical, chart, labs and discussed the procedure including the risks, benefits and alternatives for the proposed anesthesia with the patient or authorized representative who has indicated his/her understanding and acceptance.     Plan Discussed with: CRNA  Anesthesia Plan Comments:         Anesthesia Quick Evaluation

## 2015-04-12 NOTE — H&P (Signed)
Patient interval history reviewed.  Patient examined again.  There has been no change from documented H/P dated 03/31/15 (scanned into chart from our office) except as documented above.  Assessment:  1.  Intermittent epigastric abdominal pain.  Plan:  1.  Endoscopy + Endoscopic ultrasound. 2.  Risks (bleeding, infection, bowel perforation that could require surgery, sedation-related changes in cardiopulmonary systems), benefits (identification and possible treatment of source of symptoms, exclusion of certain causes of symptoms), and alternatives (watchful waiting, radiographic imaging studies, empiric medical treatment) of upper endoscopy and upper endoscopy with ultrasound (EGD/EUS) were explained to patient/family in detail and patient wishes to proceed.

## 2015-04-12 NOTE — Discharge Instructions (Signed)
Endoscopy °Care After °Please read the instructions outlined below and refer to this sheet in the next few weeks. These discharge instructions provide you with general information on caring for yourself after you leave the hospital. Your doctor may also give you specific instructions. While your treatment has been planned according to the most current medical practices available, unavoidable complications occasionally occur. If you have any problems or questions after discharge, please call Dr. Pamla Pangle ([facility]) at [phone]. ° °HOME CARE INSTRUCTIONS °Activity °· You may resume your regular activity but move at a slower pace for the next 24 hours.  °· Take frequent rest periods for the next 24 hours.  °· Walking will help expel (get rid of) the air and reduce the bloated feeling in your abdomen.  °· No driving for 24 hours (because of the anesthesia (medicine) used during the test).  °· You may shower.  °· Do not sign any important legal documents or operate any machinery for 24 hours (because of the anesthesia used during the test).  °Nutrition °· Drink plenty of fluids.  °· You may resume your normal diet.  °· Begin with a light meal and progress to your normal diet.  °· Avoid alcoholic beverages for 24 hours or as instructed by your caregiver.  °Medications °You may resume your normal medications unless your caregiver tells you otherwise. °What you can expect today °· You may experience abdominal discomfort such as a feeling of fullness or "gas" pains.  °· You may experience a sore throat for 2 to 3 days. This is normal. Gargling with salt water may help this.  °·  °SEEK IMMEDIATE MEDICAL CARE IF: °· You have excessive nausea (feeling sick to your stomach) and/or vomiting.  °· You have severe abdominal pain and distention (swelling).  °· You have trouble swallowing.  °· You have a temperature over 100° F (37.8° C).  °· You have rectal bleeding or vomiting of blood.  °Document Released:  04/16/2004 Document Revised: 05/15/2011 Document Reviewed: 10/28/2007 °ExitCare® Patient Information ©2012 [employer]. °

## 2015-04-12 NOTE — Anesthesia Postprocedure Evaluation (Signed)
  Anesthesia Post-op Note  Patient: Ashley [name]  Procedure(s) Performed: Procedure(s): ESOPHAGOGASTRODUODENOSCOPY (EGD) WITH PROPOFOL (N/A) ESOPHAGEAL ENDOSCOPIC ULTRASOUND (EUS) RADIAL (N/A)  Patient Location: PACU  Anesthesia Type:MAC  Level of Consciousness: awake, alert  and oriented  Airway and Oxygen Therapy: Patient Spontanous Breathing  Post-op Pain: none  Post-op Assessment: Post-op Vital signs reviewed              Post-op Vital Signs: Reviewed  Last Vitals:  Filed Vitals:   04/12/15 1138  BP: 133/56  Pulse: 82  Temp:   Resp: 15    Complications: No apparent anesthesia complications

## 2015-04-14 ENCOUNTER — Encounter (HOSPITAL_COMMUNITY): Payer: Self-pay | Admitting: Gastroenterology

## 2015-04-20 ENCOUNTER — Ambulatory Visit: Payer: Medicare Other | Admitting: Neurology

## 2015-04-20 ENCOUNTER — Telehealth: Payer: Self-pay

## 2015-04-20 NOTE — Telephone Encounter (Signed)
Patient did not come to a follow up appointment today.

## 2015-04-25 ENCOUNTER — Encounter: Payer: Self-pay | Admitting: Neurology

## 2015-04-28 DIAGNOSIS — E780 Pure hypercholesterolemia: Secondary | ICD-10-CM | POA: Diagnosis not present

## 2015-04-28 DIAGNOSIS — I129 Hypertensive chronic kidney disease with stage 1 through stage 4 chronic kidney disease, or unspecified chronic kidney disease: Secondary | ICD-10-CM | POA: Diagnosis not present

## 2015-04-28 DIAGNOSIS — R413 Other amnesia: Secondary | ICD-10-CM | POA: Diagnosis not present

## 2015-04-28 DIAGNOSIS — R1013 Epigastric pain: Secondary | ICD-10-CM | POA: Diagnosis not present

## 2015-04-28 DIAGNOSIS — N182 Chronic kidney disease, stage 2 (mild): Secondary | ICD-10-CM | POA: Diagnosis not present

## 2015-04-28 DIAGNOSIS — K589 Irritable bowel syndrome without diarrhea: Secondary | ICD-10-CM | POA: Diagnosis not present

## 2015-04-28 DIAGNOSIS — N390 Urinary tract infection, site not specified: Secondary | ICD-10-CM | POA: Diagnosis not present

## 2015-04-28 DIAGNOSIS — F322 Major depressive disorder, single episode, severe without psychotic features: Secondary | ICD-10-CM | POA: Diagnosis not present

## 2015-04-28 DIAGNOSIS — M791 Myalgia: Secondary | ICD-10-CM | POA: Diagnosis not present

## 2015-04-28 DIAGNOSIS — R300 Dysuria: Secondary | ICD-10-CM | POA: Diagnosis not present

## 2015-05-02 DIAGNOSIS — N200 Calculus of kidney: Secondary | ICD-10-CM | POA: Diagnosis not present

## 2015-05-02 DIAGNOSIS — R1031 Right lower quadrant pain: Secondary | ICD-10-CM | POA: Diagnosis not present

## 2015-05-02 DIAGNOSIS — N390 Urinary tract infection, site not specified: Secondary | ICD-10-CM | POA: Diagnosis not present

## 2015-06-12 DIAGNOSIS — R1010 Upper abdominal pain, unspecified: Secondary | ICD-10-CM | POA: Diagnosis not present

## 2015-06-13 ENCOUNTER — Other Ambulatory Visit: Payer: Self-pay

## 2015-06-13 DIAGNOSIS — L298 Other pruritus: Secondary | ICD-10-CM | POA: Diagnosis not present

## 2015-06-13 DIAGNOSIS — S20362A Insect bite (nonvenomous) of left front wall of thorax, initial encounter: Secondary | ICD-10-CM | POA: Diagnosis not present

## 2015-06-13 DIAGNOSIS — Z1231 Encounter for screening mammogram for malignant neoplasm of breast: Secondary | ICD-10-CM

## 2015-06-13 DIAGNOSIS — S20361A Insect bite (nonvenomous) of right front wall of thorax, initial encounter: Secondary | ICD-10-CM | POA: Diagnosis not present

## 2015-06-13 DIAGNOSIS — Z85828 Personal history of other malignant neoplasm of skin: Secondary | ICD-10-CM | POA: Diagnosis not present

## 2015-06-23 DIAGNOSIS — H40023 Open angle with borderline findings, high risk, bilateral: Secondary | ICD-10-CM | POA: Diagnosis not present

## 2015-06-23 DIAGNOSIS — H524 Presbyopia: Secondary | ICD-10-CM | POA: Diagnosis not present

## 2015-06-23 DIAGNOSIS — H04123 Dry eye syndrome of bilateral lacrimal glands: Secondary | ICD-10-CM | POA: Diagnosis not present

## 2015-06-23 DIAGNOSIS — H26493 Other secondary cataract, bilateral: Secondary | ICD-10-CM | POA: Diagnosis not present

## 2015-07-18 DIAGNOSIS — H18832 Recurrent erosion of cornea, left eye: Secondary | ICD-10-CM | POA: Diagnosis not present

## 2015-07-18 DIAGNOSIS — H5712 Ocular pain, left eye: Secondary | ICD-10-CM | POA: Diagnosis not present

## 2015-07-19 DIAGNOSIS — H5713 Ocular pain, bilateral: Secondary | ICD-10-CM | POA: Diagnosis not present

## 2015-07-19 DIAGNOSIS — H18831 Recurrent erosion of cornea, right eye: Secondary | ICD-10-CM | POA: Diagnosis not present

## 2015-07-21 ENCOUNTER — Ambulatory Visit
Admission: RE | Admit: 2015-07-21 | Discharge: 2015-07-21 | Disposition: A | Discharge status: Home / Self Care | Payer: Medicare Other | Source: Ambulatory Visit

## 2015-07-21 DIAGNOSIS — H18832 Recurrent erosion of cornea, left eye: Secondary | ICD-10-CM | POA: Diagnosis not present

## 2015-07-21 DIAGNOSIS — Z1231 Encounter for screening mammogram for malignant neoplasm of breast: Secondary | ICD-10-CM | POA: Diagnosis not present

## 2015-07-21 DIAGNOSIS — H5713 Ocular pain, bilateral: Secondary | ICD-10-CM | POA: Diagnosis not present

## 2015-07-25 DIAGNOSIS — Z23 Encounter for immunization: Secondary | ICD-10-CM | POA: Diagnosis not present

## 2015-07-26 DIAGNOSIS — N390 Urinary tract infection, site not specified: Secondary | ICD-10-CM | POA: Diagnosis not present

## 2015-07-26 DIAGNOSIS — R3989 Other symptoms and signs involving the genitourinary system: Secondary | ICD-10-CM | POA: Diagnosis not present

## 2015-07-26 DIAGNOSIS — R300 Dysuria: Secondary | ICD-10-CM | POA: Diagnosis not present

## 2015-07-26 DIAGNOSIS — R350 Frequency of micturition: Secondary | ICD-10-CM | POA: Diagnosis not present

## 2015-07-31 DIAGNOSIS — M79661 Pain in right lower leg: Secondary | ICD-10-CM | POA: Diagnosis not present

## 2015-07-31 DIAGNOSIS — M4726 Other spondylosis with radiculopathy, lumbar region: Secondary | ICD-10-CM | POA: Diagnosis not present

## 2015-08-02 ENCOUNTER — Other Ambulatory Visit: Payer: Self-pay | Admitting: Sports Medicine

## 2015-08-02 DIAGNOSIS — M545 Low back pain: Secondary | ICD-10-CM

## 2015-08-03 ENCOUNTER — Ambulatory Visit
Admission: RE | Admit: 2015-08-03 | Discharge: 2015-08-03 | Disposition: A | Discharge status: Home / Self Care | Payer: Medicare Other | Source: Ambulatory Visit | Attending: Sports Medicine | Admitting: Sports Medicine

## 2015-08-03 DIAGNOSIS — M545 Low back pain: Secondary | ICD-10-CM

## 2015-08-03 DIAGNOSIS — M4806 Spinal stenosis, lumbar region: Secondary | ICD-10-CM | POA: Diagnosis not present

## 2015-08-07 DIAGNOSIS — M79661 Pain in right lower leg: Secondary | ICD-10-CM | POA: Diagnosis not present

## 2015-08-07 DIAGNOSIS — M4726 Other spondylosis with radiculopathy, lumbar region: Secondary | ICD-10-CM | POA: Diagnosis not present

## 2015-08-18 DIAGNOSIS — H04123 Dry eye syndrome of bilateral lacrimal glands: Secondary | ICD-10-CM | POA: Diagnosis not present

## 2015-08-18 DIAGNOSIS — H40023 Open angle with borderline findings, high risk, bilateral: Secondary | ICD-10-CM | POA: Diagnosis not present

## 2015-08-18 DIAGNOSIS — R51 Headache: Secondary | ICD-10-CM | POA: Diagnosis not present

## 2015-08-18 DIAGNOSIS — H5713 Ocular pain, bilateral: Secondary | ICD-10-CM | POA: Diagnosis not present

## 2015-08-18 DIAGNOSIS — H18831 Recurrent erosion of cornea, right eye: Secondary | ICD-10-CM | POA: Diagnosis not present

## 2015-08-28 ENCOUNTER — Other Ambulatory Visit: Payer: Self-pay | Admitting: Family Medicine

## 2015-08-28 DIAGNOSIS — R1013 Epigastric pain: Secondary | ICD-10-CM

## 2015-09-05 ENCOUNTER — Other Ambulatory Visit: Payer: Medicare Other

## 2015-09-19 DIAGNOSIS — M4726 Other spondylosis with radiculopathy, lumbar region: Secondary | ICD-10-CM | POA: Diagnosis not present

## 2015-09-19 DIAGNOSIS — M19071 Primary osteoarthritis, right ankle and foot: Secondary | ICD-10-CM | POA: Diagnosis not present

## 2015-09-19 DIAGNOSIS — M19072 Primary osteoarthritis, left ankle and foot: Secondary | ICD-10-CM | POA: Diagnosis not present

## 2015-09-19 DIAGNOSIS — M7741 Metatarsalgia, right foot: Secondary | ICD-10-CM | POA: Diagnosis not present

## 2015-09-25 ENCOUNTER — Other Ambulatory Visit: Payer: Medicare Other

## 2015-09-27 ENCOUNTER — Ambulatory Visit
Admission: RE | Admit: 2015-09-27 | Discharge: 2015-09-27 | Disposition: A | Discharge status: Home / Self Care | Payer: Medicare Other | Source: Ambulatory Visit | Attending: Family Medicine | Admitting: Family Medicine

## 2015-09-27 DIAGNOSIS — R1013 Epigastric pain: Secondary | ICD-10-CM

## 2015-10-03 DIAGNOSIS — J00 Acute nasopharyngitis [common cold]: Secondary | ICD-10-CM | POA: Diagnosis not present

## 2015-10-04 DIAGNOSIS — J329 Chronic sinusitis, unspecified: Secondary | ICD-10-CM | POA: Diagnosis not present

## 2015-10-17 DIAGNOSIS — G43909 Migraine, unspecified, not intractable, without status migrainosus: Secondary | ICD-10-CM | POA: Diagnosis not present

## 2015-10-17 DIAGNOSIS — K589 Irritable bowel syndrome without diarrhea: Secondary | ICD-10-CM | POA: Diagnosis not present

## 2015-10-19 DIAGNOSIS — M7542 Impingement syndrome of left shoulder: Secondary | ICD-10-CM | POA: Diagnosis not present

## 2015-10-24 DIAGNOSIS — J069 Acute upper respiratory infection, unspecified: Secondary | ICD-10-CM | POA: Diagnosis not present

## 2015-11-20 ENCOUNTER — Other Ambulatory Visit: Payer: Self-pay | Admitting: Family Medicine

## 2015-11-20 DIAGNOSIS — R413 Other amnesia: Secondary | ICD-10-CM | POA: Diagnosis not present

## 2015-11-20 DIAGNOSIS — R1031 Right lower quadrant pain: Secondary | ICD-10-CM

## 2015-11-20 DIAGNOSIS — M8580 Other specified disorders of bone density and structure, unspecified site: Secondary | ICD-10-CM | POA: Diagnosis not present

## 2015-11-20 DIAGNOSIS — I700 Atherosclerosis of aorta: Secondary | ICD-10-CM | POA: Diagnosis not present

## 2015-11-20 DIAGNOSIS — R1909 Other intra-abdominal and pelvic swelling, mass and lump: Secondary | ICD-10-CM

## 2015-11-20 DIAGNOSIS — E7800 Pure hypercholesterolemia, unspecified: Secondary | ICD-10-CM | POA: Diagnosis not present

## 2015-11-20 DIAGNOSIS — R350 Frequency of micturition: Secondary | ICD-10-CM | POA: Diagnosis not present

## 2015-11-20 DIAGNOSIS — E213 Hyperparathyroidism, unspecified: Secondary | ICD-10-CM | POA: Diagnosis not present

## 2015-11-20 DIAGNOSIS — N200 Calculus of kidney: Secondary | ICD-10-CM | POA: Diagnosis not present

## 2015-11-20 DIAGNOSIS — F322 Major depressive disorder, single episode, severe without psychotic features: Secondary | ICD-10-CM | POA: Diagnosis not present

## 2015-11-20 DIAGNOSIS — N182 Chronic kidney disease, stage 2 (mild): Secondary | ICD-10-CM | POA: Diagnosis not present

## 2015-11-20 DIAGNOSIS — E559 Vitamin D deficiency, unspecified: Secondary | ICD-10-CM | POA: Diagnosis not present

## 2015-11-20 DIAGNOSIS — I129 Hypertensive chronic kidney disease with stage 1 through stage 4 chronic kidney disease, or unspecified chronic kidney disease: Secondary | ICD-10-CM | POA: Diagnosis not present

## 2015-11-20 DIAGNOSIS — Z0000 Encounter for general adult medical examination without abnormal findings: Secondary | ICD-10-CM | POA: Diagnosis not present

## 2015-11-20 DIAGNOSIS — N390 Urinary tract infection, site not specified: Secondary | ICD-10-CM | POA: Diagnosis not present

## 2015-11-27 ENCOUNTER — Ambulatory Visit
Admission: RE | Admit: 2015-11-27 | Discharge: 2015-11-27 | Disposition: A | Discharge status: Home / Self Care | Payer: Medicare Other | Source: Ambulatory Visit | Attending: Family Medicine | Admitting: Family Medicine

## 2015-11-27 DIAGNOSIS — R1031 Right lower quadrant pain: Secondary | ICD-10-CM

## 2015-11-27 DIAGNOSIS — N200 Calculus of kidney: Secondary | ICD-10-CM | POA: Diagnosis not present

## 2015-11-27 DIAGNOSIS — R1909 Other intra-abdominal and pelvic swelling, mass and lump: Secondary | ICD-10-CM

## 2015-11-27 DIAGNOSIS — R102 Pelvic and perineal pain: Secondary | ICD-10-CM | POA: Diagnosis not present

## 2015-12-28 DIAGNOSIS — N815 Vaginal enterocele: Secondary | ICD-10-CM | POA: Diagnosis not present

## 2015-12-28 DIAGNOSIS — N941 Unspecified dyspareunia: Secondary | ICD-10-CM | POA: Diagnosis not present

## 2015-12-28 DIAGNOSIS — N816 Rectocele: Secondary | ICD-10-CM | POA: Diagnosis not present

## 2015-12-28 DIAGNOSIS — N8111 Cystocele, midline: Secondary | ICD-10-CM | POA: Diagnosis not present

## 2016-04-25 DIAGNOSIS — R198 Other specified symptoms and signs involving the digestive system and abdomen: Secondary | ICD-10-CM | POA: Diagnosis not present

## 2016-04-30 DIAGNOSIS — B029 Zoster without complications: Secondary | ICD-10-CM | POA: Diagnosis not present

## 2016-05-14 ENCOUNTER — Ambulatory Visit (INDEPENDENT_AMBULATORY_CARE_PROVIDER_SITE_OTHER): Payer: Medicare Other | Admitting: Neurology

## 2016-05-14 ENCOUNTER — Encounter: Payer: Self-pay | Admitting: Neurology

## 2016-05-14 VITALS — BP 165/95 | HR 86 | Ht 61.0 in | Wt 143.5 lb

## 2016-05-14 DIAGNOSIS — R413 Other amnesia: Secondary | ICD-10-CM | POA: Diagnosis not present

## 2016-05-14 DIAGNOSIS — E538 Deficiency of other specified B group vitamins: Secondary | ICD-10-CM | POA: Diagnosis not present

## 2016-05-14 HISTORY — DX: Other amnesia: R41.3

## 2016-05-14 NOTE — Patient Instructions (Signed)
   We will check MRI of the brain and consider a medication for memory.

## 2016-05-14 NOTE — Progress Notes (Signed)
Reason for visit: Memory disturbance  Ashley [name] is an 75 y.o. 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 The patient has developed some issues with word finding and some memory issues over the last 6-8 months. The patient has had gradual progression of these problems. The patient is still running a travel business, she is doing well with this. She may repeat herself with questions at times. She denies any significant issues with getting lost while driving, she is able to manage finances well and keep up with medications and appointments. The patient reports no numbness or weakness on the face, arms, or legs. She does misplace things about the house frequently. She has had some increase in fatigue. The patient was seen in March 2016 with headache problems, MRI of the brain done at that time showed some small vessel ischemic changes, but the patient also appeared to have multiple microhemorrhages in the brain that could be related to a history of hypertension or to amyloid angiopathy. The patient returns to the office today for an evaluation.  Past Medical History:  Diagnosis Date  . Arthritis    back and neck  . Carotid artery stenosis   . Chronic rhinosinusitis   . DDD (degenerative disc disease)   . Depression   . Diverticulosis of colon   . Frequency of urination   . Glaucoma   . Headache disorder 12/13/2014   Right temple  . Hearing loss   . History of kidney stones    MULTIBLE SURGICAL INTERVENTIONS  . History of skin cancer HX TOPICAL FACIAL [place] 2013  . History of TIA (transient ischemic attack)    NOV 2013-- NO RESIDUAL  . Hypertension   . IBS (irritable bowel syndrome)   . Memory loss   . Mild asthma    COLD INDUCED  . Right ureteral stone   . Seasonal and perennial allergic rhinitis   . Shingles    EYEBALL 09-17-2013  . Spondylosis     Past  Surgical History:  Procedure Laterality Date  . BUNIONECTOMY     BILATERAL  . CALDWELL LUC     sinus drainage procedure  . CYSTO/ BILATERAL URETEROSCOPIC STONE EXTRACTIONS  02-09-2003  . CYSTO/ BILATERAL URETEROSCOPY/  LASER OF STONES LEFT SIDE  06-02-2007  . CYSTOSCOPY/RETROGRADE/URETEROSCOPY/STONE EXTRACTION WITH BASKET  03/31/2012   Procedure: CYSTOSCOPY/RETROGRADE/URETEROSCOPY/STONE EXTRACTION WITH BASKET;  Surgeon: Malka So, MD;  Location: [facility];  Service: Urology;  Laterality: Right;   . CYSTOSCOPY/RETROGRADE/URETEROSCOPY/STONE EXTRACTION WITH BASKET Right 03/29/2013   Procedure: CYSTOSCOPY//URETEROSCOPY/STONE EXTRACTION WITH BASKET;  Surgeon: Malka So, MD;  Location: [facility];  Service: Urology;  Laterality: Right;  . CYSTOSCOPY/RETROGRADE/URETEROSCOPY/STONE EXTRACTION WITH BASKET Right 10/14/2013   Procedure: CYSTOSCOPY/RETROGRADE/URETEROSCOPY/STONE EXTRACTION WITH BASKET;  Surgeon: Irine Seal, MD;  Location: [facility];  Service: Urology;  Laterality: Right;  . ESOPHAGOGASTRODUODENOSCOPY (EGD) WITH PROPOFOL N/A 04/12/2015   Procedure: ESOPHAGOGASTRODUODENOSCOPY (EGD) WITH PROPOFOL;  Surgeon: Arta Silence, MD;  Location: [facility];  Service: Endoscopy;  Laterality: N/A;  . EUS N/A 04/12/2015   Procedure: ESOPHAGEAL ENDOSCOPIC ULTRASOUND (EUS) RADIAL;  Surgeon: Arta Silence, MD;  Location: [facility];  Service: Endoscopy;  Laterality: N/A;  . EXTRACORPOREAL SHOCK WAVE LITHOTRIPSY  about [id number] Dr Reece Agar   Never completed procedure-could not tolerate pain. Had seizures due to pain  . EYE SURGERY  AGE 19   INJURY  .  HERNIA REPAIR  AGE 72  . HOLMIUM LASER APPLICATION Right [id number]   Procedure: HOLMIUM LASER APPLICATION;  Surgeon: Irine Seal, MD;  Location: [facility];  Service: Urology;  Laterality: Right;  . LAPAROSCOPIC CHOLECYSTECTOMY  01-12-2005  . LEFT URETEROSCOPIC STONE EXTRACTION    LAST ONE 04-28-2009   SEVERAL  SURGICAL STONE EXTRACTIONS  . LUMBAR LAMINECTOMY  09-30-2002   L4 - 5;  SPONDYLOLISTHISES W/ STENOSIS AND RADICULOPATHY  . PERCUTANEOUS NEPHROSTOLITHOTOMY  1975  . RIGHT URETEROSCOPIC STONE EXTRACTION  LAST ONE 02-21-2011   MULTIPLE SURGICAL STONE EXTRACTIONS  . TONSILECTOMY, ADENOIDECTOMY, BILATERAL MYRINGOTOMY AND TUBES  CHILD  . VAGINAL HYSTERECTOMY  1991   W/ BILATERAL SALPINGOOPHECTOMY    Family History  Problem Relation Age of Onset  . COPD Mother   . Breast cancer Mother   . Colon cancer Neg Hx   . Migraines Neg Hx     Social history:  reports that she quit smoking about 30 years ago. Her smoking use included Cigarettes. She quit after 23.00 years of use. She has never used smokeless tobacco. She reports that she drinks alcohol. She reports that she does not use drugs.   No Known Allergies  Medications:  Prior to Admission medications   Medication Sig Start Date End Date Taking? Authorizing Provider  aliskiren (TEKTURNA) 150 MG tablet Take 150 mg by mouth every evening.    Yes Historical Provider, MD  amoxicillin (AMOXIL) 500 MG capsule Take 500 mg by mouth as needed.   Yes Historical Provider, MD  aspirin EC 81 MG tablet Take 162 mg by mouth 3 (three) times a week.   Yes Historical Provider, MD  BENZONATATE PO Take by mouth as needed.   Yes Historical Provider, MD  celecoxib (CELEBREX) 200 MG capsule Take 200 mg by mouth daily. 09/07/14  Yes Historical Provider, MD  desipramine (NORPRAMIN) 25 MG tablet Take 25 mg by mouth at bedtime.    Yes Historical Provider, MD  Docusate Calcium (STOOL SOFTENER PO) Take by mouth as needed (constipation).    Yes Historical Provider, MD  doxycycline (VIBRA-TABS) 100 MG tablet Take 100 mg by mouth daily as needed (outbreak).  12/25/12  Yes Thayer Headings, MD  estradiol (ESTRACE) 1 MG tablet Take 1 mg by mouth daily.   Yes Historical Provider, MD  fish oil-omega-3 fatty acids 1000 MG capsule Take 2 g by mouth daily.    Yes Historical  Provider, MD  HYDROcodone-acetaminophen (NORCO/VICODIN) 5-325 MG per tablet Take 1 tablet by mouth every 4 (four) hours as needed for pain. For pain 03/29/13  Yes Irine Seal, MD  metroNIDAZOLE (FLAGYL) 500 MG tablet as needed. 02/29/16  Yes Historical Provider, MD  Multiple Vitamin (MULTIVITAMIN PO) Take 1 tablet by mouth daily.    Yes Historical Provider, MD  omeprazole (PRILOSEC OTC) 20 MG tablet Take 1 tablet (20 mg total) by mouth daily. 04/12/15  Yes Arta Silence, MD  phenazopyridine (PYRIDIUM) 200 MG tablet Take 1 tablet (200 mg total) by mouth 3 (three) times daily as needed for pain. 10/14/13  Yes Irine Seal, MD  pravastatin (PRAVACHOL) 40 MG tablet  04/11/16  Yes Historical Provider, MD  promethazine (PHENERGAN) 25 MG tablet Take 1 tablet (25 mg total) by mouth every 6 (six) hours as needed for nausea or vomiting. 10/14/13  Yes Irine Seal, MD  Sulfamethoxazole-Trimethoprim (SULFAMETHOXAZOLE-TMP DS PO) Take by mouth as needed.   Yes Historical Provider, MD  valACYclovir (VALTREX) 1000 MG tablet Take 1,000 mg by mouth  2 (two) times daily as needed (herpes).    Yes Historical Provider, MD    ROS:  Out of a complete 14 system review of symptoms, the patient complains only of the following symptoms, and all other reviewed systems are negative.  Fatigue Ear pain Eye pain Abdominal pain, constipation, diarrhea Joint pain, back pain Memory loss, headache, speech difficulty  Blood pressure (!) 165/95, pulse 86, height 5\' 1"  (1.549 m), weight 143 lb 8 oz (65.1 kg).  Physical Exam  General: The patient is alert and cooperative at the time of the examination.  Eyes: Pupils are equal, round and reactive to light, discs are flat bilaterally.  Neck: Neck is supple and no carotid bruits are noted.  Respiratory: Lung fields are clear.  Cardiovascular: Regular rate and rhythm. No murmurs noted.  Skin: No significant peripheral edema is noted.   Neurologic Exam  Mental status: The patient  is alert and oriented x 3 at the time of the examination. The patient has apparent normal recent and remote memory, with an apparently normal attention span and concentration ability. Mini-Mental Status Examination done today shows total score of 25/30. The patient is able to name 8 animals in 30 seconds.   Cranial nerves: Facial symmetry is present. Speech is normal, no aphasia or dysarthria is noted. Extraocular movements are full. Visual fields are full.   Motor: The patient has good strength in all 4 extremities.  Sensory examination: Soft touch sensation is symmetric on the face, arms, and legs. Vibration and position sense of all 4 extremities is normal bilaterally.  Coordination: The patient has good finger-nose-finger and heel-to-shin bilaterally.  Gait and station: The patient has a normal gait. Tandem gait is normal. Romberg is negative. No drift is seen.  Reflexes: Deep tendon reflexes are symmetric.   MRI brain 12/29/2014:  IMPRESSION:  Abnormal MRI brain (without) demonstrating: 1. Mild scattered periventricular and subcortical foci of chronic small vessel ischemic disease. 2. Numerous chronic cerebral microhemorrhages in the bilateral hemispheres. May be related to chronic small vessel ischemic disease or amyloid angiopathy.  3. Mild pannus formation posterior to the odontoid process with mild kinking of the cervicomedullary junction. 4. Compared to MRI on 07/10/12, there has been progression of white matter disease.   * MRI scan images were reviewed online. I agree with the written report.    Assessment/Plan:  1. Progressive memory disturbance  2. Abnormal MRI brain  The patient has developed a progressive memory disturbance over the last 6-8 months. Prior MRI of the brain has shown evidence of microhemorrhages that could represent amyloid angiopathy. This could potentially be the etiology of her memory issues. We will repeat MRI of the brain at this time, we have  discussed potentially going on Aricept. The patient will let me know if she wishes to go on the medication. We will check blood work today. She will follow-up in 6 months.  Jill Alexanders MD 05/14/2016 10:52 AM  [facility] [address] [place], [place] [address]  Phone [phone] Fax [phone]

## 2016-05-15 LAB — SEDIMENTATION RATE: SED RATE: 6 mm/h (ref 0–40)

## 2016-05-15 LAB — COPPER, SERUM: Copper: 129 ug/dL (ref 72–166)

## 2016-05-15 LAB — RPR: RPR Ser Ql: NONREACTIVE

## 2016-05-15 LAB — VITAMIN B12: Vitamin B-12: 1769 pg/mL — ABNORMAL HIGH (ref 211–946)

## 2016-05-21 ENCOUNTER — Other Ambulatory Visit: Payer: Self-pay | Admitting: Neurology

## 2016-05-25 ENCOUNTER — Ambulatory Visit
Admission: RE | Admit: 2016-05-25 | Discharge: 2016-05-25 | Disposition: A | Discharge status: Home / Self Care | Payer: Medicare Other | Source: Ambulatory Visit | Attending: Neurology | Admitting: Neurology

## 2016-05-25 DIAGNOSIS — R413 Other amnesia: Secondary | ICD-10-CM | POA: Diagnosis not present

## 2016-05-27 ENCOUNTER — Telehealth: Payer: Self-pay | Admitting: Neurology

## 2016-05-27 DIAGNOSIS — N390 Urinary tract infection, site not specified: Secondary | ICD-10-CM | POA: Diagnosis not present

## 2016-05-27 DIAGNOSIS — R319 Hematuria, unspecified: Secondary | ICD-10-CM | POA: Diagnosis not present

## 2016-05-27 DIAGNOSIS — F322 Major depressive disorder, single episode, severe without psychotic features: Secondary | ICD-10-CM | POA: Diagnosis not present

## 2016-05-27 DIAGNOSIS — R413 Other amnesia: Secondary | ICD-10-CM | POA: Diagnosis not present

## 2016-05-27 DIAGNOSIS — I700 Atherosclerosis of aorta: Secondary | ICD-10-CM | POA: Diagnosis not present

## 2016-05-27 DIAGNOSIS — N182 Chronic kidney disease, stage 2 (mild): Secondary | ICD-10-CM | POA: Diagnosis not present

## 2016-05-27 DIAGNOSIS — E559 Vitamin D deficiency, unspecified: Secondary | ICD-10-CM | POA: Diagnosis not present

## 2016-05-27 DIAGNOSIS — I129 Hypertensive chronic kidney disease with stage 1 through stage 4 chronic kidney disease, or unspecified chronic kidney disease: Secondary | ICD-10-CM | POA: Diagnosis not present

## 2016-05-27 DIAGNOSIS — E7800 Pure hypercholesterolemia, unspecified: Secondary | ICD-10-CM | POA: Diagnosis not present

## 2016-05-27 DIAGNOSIS — Z23 Encounter for immunization: Secondary | ICD-10-CM | POA: Diagnosis not present

## 2016-05-27 MED ORDER — DONEPEZIL HCL 5 MG PO TABS: 5.0000 mg | ORAL_TABLET | Freq: Every day | ORAL | 1 refills | Status: DC

## 2016-05-27 NOTE — Telephone Encounter (Signed)
I called the patient. I discussed the results of the MRI the brain. There may be a suggestion of amyloid angiopathy. This could be a potential source of the memory issue. I discussed the potential for going on Aricept. I will get the patient started on the 5 mg dose, they are to call me in one month if she is doing well with this.   MRI brain 05/27/16:  IMPRESSION:  This MRI of the brain without contrast shows the following: 1.    Multiple cortical and subcortical microhemorrhages consistent with amyloid angiopathy. No definite change when compared to the MRI from 12/28/2014. 2.    T2/FLAIR hyperintense foci, predominantly in the subcortical and deep white matter of both hemispheres consistent with chronic microvascular ischemic change, unchanged when compared to the 12/28/2014 MRI. 3.     Degenerative pannus at C1-C2. This is mild and slightly kinks the cervicomedullary junction. 4.    There are no acute findings.

## 2016-05-29 ENCOUNTER — Other Ambulatory Visit: Payer: Medicare Other

## 2016-06-07 DIAGNOSIS — L570 Actinic keratosis: Secondary | ICD-10-CM | POA: Diagnosis not present

## 2016-06-07 DIAGNOSIS — Z85828 Personal history of other malignant neoplasm of skin: Secondary | ICD-10-CM | POA: Diagnosis not present

## 2016-06-07 DIAGNOSIS — D1801 Hemangioma of skin and subcutaneous tissue: Secondary | ICD-10-CM | POA: Diagnosis not present

## 2016-06-07 DIAGNOSIS — D225 Melanocytic nevi of trunk: Secondary | ICD-10-CM | POA: Diagnosis not present

## 2016-06-07 DIAGNOSIS — L298 Other pruritus: Secondary | ICD-10-CM | POA: Diagnosis not present

## 2016-06-07 DIAGNOSIS — D692 Other nonthrombocytopenic purpura: Secondary | ICD-10-CM | POA: Diagnosis not present

## 2016-06-19 ENCOUNTER — Other Ambulatory Visit: Payer: Self-pay | Admitting: Family Medicine

## 2016-06-19 DIAGNOSIS — Z1231 Encounter for screening mammogram for malignant neoplasm of breast: Secondary | ICD-10-CM

## 2016-06-20 ENCOUNTER — Ambulatory Visit (INDEPENDENT_AMBULATORY_CARE_PROVIDER_SITE_OTHER): Payer: Medicare Other | Admitting: Neurology

## 2016-06-20 ENCOUNTER — Encounter: Payer: Self-pay | Admitting: Neurology

## 2016-06-20 VITALS — BP 142/81 | HR 100 | Ht 61.0 in | Wt 140.0 lb

## 2016-06-20 DIAGNOSIS — I680 Cerebral amyloid angiopathy: Secondary | ICD-10-CM | POA: Diagnosis not present

## 2016-06-20 DIAGNOSIS — R413 Other amnesia: Secondary | ICD-10-CM | POA: Diagnosis not present

## 2016-06-20 HISTORY — DX: Cerebral amyloid angiopathy: I68.0

## 2016-06-20 MED ORDER — DESIPRAMINE HCL 10 MG PO TABS: 20.0000 mg | ORAL_TABLET | Freq: Every day | ORAL | 2 refills | Status: DC

## 2016-06-20 NOTE — Progress Notes (Signed)
Reason for visit: Memory disturbance  Ashley [name] is an 75 y.o. female  History of present illness:  Ashley [name] is a 75 year old right-handed white female with a history of a progressive memory disturbance. MRI of the brain has shown changes that could be consistent with amyloid angiopathy. The patient is also on desipramine taking 25 mg at night. She has been on this for a number of years. The patient was given a prescription for Aricept, she has not yet started the medication. He comes in today for an evaluation. She claims that she is sleeping fairly well, she is still trying to work, but she is not well organized which has become a problem for her. She denies any other new medical issues since last seen. She comes in with multiple questions regarding her medications or any dietary supplements that may be useful for her.  Past Medical History:  Diagnosis Date  . Arthritis    back and neck  . Carotid artery stenosis   . Chronic rhinosinusitis   . DDD (degenerative disc disease)   . Depression   . Diverticulosis of colon   . Frequency of urination   . Glaucoma   . Headache disorder 12/13/2014   Right temple  . Hearing loss   . History of kidney stones    MULTIBLE SURGICAL INTERVENTIONS  . History of skin cancer HX TOPICAL FACIAL [place] 2013  . History of TIA (transient ischemic attack)    NOV 2013-- NO RESIDUAL  . Hypertension   . IBS (irritable bowel syndrome)   . Memory difficulty 05/14/2016  . Memory loss   . Mild asthma    COLD INDUCED  . Right ureteral stone   . Seasonal and perennial allergic rhinitis   . Shingles    EYEBALL 09-17-2013  . Spondylosis     Past Surgical History:  Procedure Laterality Date  . BUNIONECTOMY     BILATERAL  . CALDWELL LUC     sinus drainage procedure  . CYSTO/ BILATERAL URETEROSCOPIC STONE EXTRACTIONS  02-09-2003  . CYSTO/ BILATERAL URETEROSCOPY/  LASER OF STONES LEFT SIDE  06-02-2007  .  CYSTOSCOPY/RETROGRADE/URETEROSCOPY/STONE EXTRACTION WITH BASKET  03/31/2012   Procedure: CYSTOSCOPY/RETROGRADE/URETEROSCOPY/STONE EXTRACTION WITH BASKET;  Surgeon: Malka So, MD;  Location: [facility];  Service: Urology;  Laterality: Right;   . CYSTOSCOPY/RETROGRADE/URETEROSCOPY/STONE EXTRACTION WITH BASKET Right 03/29/2013   Procedure: CYSTOSCOPY//URETEROSCOPY/STONE EXTRACTION WITH BASKET;  Surgeon: Malka So, MD;  Location: [facility];  Service: Urology;  Laterality: Right;  . CYSTOSCOPY/RETROGRADE/URETEROSCOPY/STONE EXTRACTION WITH BASKET Right 10/14/2013   Procedure: CYSTOSCOPY/RETROGRADE/URETEROSCOPY/STONE EXTRACTION WITH BASKET;  Surgeon: Irine Seal, MD;  Location: [facility];  Service: Urology;  Laterality: Right;  . ESOPHAGOGASTRODUODENOSCOPY (EGD) WITH PROPOFOL N/A 04/12/2015   Procedure: ESOPHAGOGASTRODUODENOSCOPY (EGD) WITH PROPOFOL;  Surgeon: Arta Silence, MD;  Location: [facility];  Service: Endoscopy;  Laterality: N/A;  . EUS N/A 04/12/2015   Procedure: ESOPHAGEAL ENDOSCOPIC ULTRASOUND (EUS) RADIAL;  Surgeon: Arta Silence, MD;  Location: [facility];  Service: Endoscopy;  Laterality: N/A;  . EXTRACORPOREAL SHOCK WAVE LITHOTRIPSY  about [id number] Dr Reece Agar   Never completed procedure-could not tolerate pain. Had seizures due to pain  . EYE SURGERY  AGE 36   INJURY  . HERNIA REPAIR  AGE 3  . HOLMIUM LASER APPLICATION Right [id number]   Procedure: HOLMIUM LASER APPLICATION;  Surgeon: Irine Seal, MD;  Location: [facility];  Service: Urology;  Laterality: Right;  . LAPAROSCOPIC CHOLECYSTECTOMY  01-12-2005  .  LEFT URETEROSCOPIC STONE EXTRACTION    LAST ONE 04-28-2009   SEVERAL SURGICAL STONE EXTRACTIONS  . LUMBAR LAMINECTOMY  09-30-2002   L4 - 5;  SPONDYLOLISTHISES W/ STENOSIS AND RADICULOPATHY  . PERCUTANEOUS NEPHROSTOLITHOTOMY  1975  . RIGHT URETEROSCOPIC STONE EXTRACTION  LAST ONE 02-21-2011   MULTIPLE SURGICAL STONE EXTRACTIONS  .  TONSILECTOMY, ADENOIDECTOMY, BILATERAL MYRINGOTOMY AND TUBES  CHILD  . VAGINAL HYSTERECTOMY  1991   W/ BILATERAL SALPINGOOPHECTOMY    Family History  Problem Relation Age of Onset  . COPD Mother   . Breast cancer Mother   . Colon cancer Neg Hx   . Migraines Neg Hx     Social history:  reports that she quit smoking about 30 years ago. Her smoking use included Cigarettes. She quit after 23.00 years of use. She has never used smokeless tobacco. She reports that she drinks alcohol. She reports that she does not use drugs.   No Known Allergies  Medications:  Prior to Admission medications   Medication Sig Start Date End Date Taking? Authorizing Provider  aliskiren (TEKTURNA) 150 MG tablet Take 150 mg by mouth every evening.    Yes Historical Provider, MD  amoxicillin (AMOXIL) 500 MG capsule Take 500 mg by mouth as needed.   Yes Historical Provider, MD  aspirin EC 81 MG tablet Take 162 mg by mouth 3 (three) times a week.   Yes Historical Provider, MD  BENZONATATE PO Take by mouth as needed.   Yes Historical Provider, MD  celecoxib (CELEBREX) 200 MG capsule Take 200 mg by mouth daily. 09/07/14  Yes Historical Provider, MD  desipramine (NORPRAMIN) 25 MG tablet Take 25 mg by mouth at bedtime.    Yes Historical Provider, MD  Docusate Calcium (STOOL SOFTENER PO) Take by mouth as needed (constipation).    Yes Historical Provider, MD  fish oil-omega-3 fatty acids 1000 MG capsule Take 2 g by mouth daily.    Yes Historical Provider, MD  HYDROcodone-acetaminophen (NORCO/VICODIN) 5-325 MG per tablet Take 1 tablet by mouth every 4 (four) hours as needed for pain. For pain 03/29/13  Yes Irine Seal, MD  metroNIDAZOLE (FLAGYL) 500 MG tablet as needed. 02/29/16  Yes Historical Provider, MD  Multiple Vitamin (MULTIVITAMIN PO) Take 1 tablet by mouth daily.    Yes Historical Provider, MD  phenazopyridine (PYRIDIUM) 200 MG tablet Take 1 tablet (200 mg total) by mouth 3 (three) times daily as needed for pain.  10/14/13  Yes Irine Seal, MD  pravastatin (PRAVACHOL) 40 MG tablet  04/11/16  Yes Historical Provider, MD  promethazine (PHENERGAN) 25 MG tablet Take 1 tablet (25 mg total) by mouth every 6 (six) hours as needed for nausea or vomiting. 10/14/13  Yes Irine Seal, MD  Sulfamethoxazole-Trimethoprim (SULFAMETHOXAZOLE-TMP DS PO) Take by mouth as needed.   Yes Historical Provider, MD  valACYclovir (VALTREX) 1000 MG tablet Take 1,000 mg by mouth 2 (two) times daily as needed (herpes).    Yes Historical Provider, MD  donepezil (ARICEPT) 5 MG tablet Take 1 tablet (5 mg total) by mouth at bedtime. Patient not taking: Reported on 06/20/2016 05/27/16   Kathrynn Ducking, MD    ROS:  Out of a complete 14 system review of symptoms, the patient complains only of the following symptoms, and all other reviewed systems are negative.  Hearing loss Cough Joint pain, back pain Memory loss, speech difficulty  Blood pressure (!) 142/81, pulse 100, height 5\' 1"  (1.549 m), weight 140 lb (63.5 kg).  Physical Exam  General:  The patient is alert and cooperative at the time of the examination.  Skin: No significant peripheral edema is noted.   Neurologic Exam  Mental status: The patient is alert and oriented x 3 at the time of the examination. The patient has apparent normal recent and remote memory, with an apparently normal attention span and concentration ability.   Cranial nerves: Facial symmetry is present. Speech is normal, no aphasia or dysarthria is noted. Extraocular movements are full. Visual fields are full.  Motor: The patient has good strength in all 4 extremities.  Sensory examination: Soft touch sensation is symmetric on the face, arms, and legs.  Coordination: The patient has good finger-nose-finger and heel-to-shin bilaterally. Some apraxia with the use of the extremities is noted.  Gait and station: The patient has a normal gait. Tandem gait is normal. Romberg is negative. No drift is  seen.  Reflexes: Deep tendon reflexes are symmetric.   MRI brain 05/27/16:  IMPRESSION: This MRI of the brain without contrast shows the following: 1. Multiple cortical and subcortical microhemorrhages consistent with amyloid angiopathy. No definite change when compared to the MRI from 12/28/2014. 2. T2/FLAIR hyperintense foci, predominantly in the subcortical and deep white matter of both hemispheres consistent with chronic microvascular ischemic change, unchanged when compared to the 12/28/2014 MRI. 3. Degenerative pannus at C1-C2. This is mild and slightly kinks the cervicomedullary junction. 4. There are no acute findings.  * MRI scan images were reviewed online. I agree with the written report.    Assessment/Plan:  1. Progressive memory disturbance  2. Abnormal MRI brain, possible amyloid angiopathy  The patient will be reduced on the desipramine been taking 20 mg at night for a month, then try to go to 10 mg at night for 3 months. If possible, she may be able to stop the medication. She will follow-up for her next scheduled appointment in March 2018. She will contact me if she is not able to tolerate the Aricept at 5 mg at night.  Jill Alexanders MD 06/20/2016 2:40 PM  [facility] [address] [place], [place] [address]  Phone [phone] Fax [phone]

## 2016-06-20 NOTE — Patient Instructions (Signed)
   Start the Aricept 5 mg at night. We will reduce the desipramine to 10 mg taking 2 at night for one month, then try to go to 10 mg at night. Do this for at least 3 months before trying to stop the medication.  Begin Aricept (donepezil) at 5 mg at night for one month. If this medication is well-tolerated, please call our office and we will call in a prescription for the 10 mg tablets. Look out for side effects that may include nausea, diarrhea, weight loss, or stomach cramps. This medication will also cause a runny nose, therefore there is no need for allergy medications for this purpose.

## 2016-06-22 ENCOUNTER — Emergency Department (HOSPITAL_COMMUNITY): Payer: Medicare Other

## 2016-06-22 ENCOUNTER — Emergency Department (HOSPITAL_COMMUNITY)
Admission: EM | Admit: 2016-06-22 | Discharge: 2016-06-23 | Disposition: A | Discharge status: Home / Self Care | Payer: Medicare Other | Attending: Emergency Medicine | Admitting: Emergency Medicine

## 2016-06-22 ENCOUNTER — Encounter (HOSPITAL_COMMUNITY): Payer: Self-pay | Admitting: Emergency Medicine

## 2016-06-22 DIAGNOSIS — S79912A Unspecified injury of left hip, initial encounter: Secondary | ICD-10-CM | POA: Diagnosis not present

## 2016-06-22 DIAGNOSIS — Z7982 Long term (current) use of aspirin: Secondary | ICD-10-CM | POA: Insufficient documentation

## 2016-06-22 DIAGNOSIS — R51 Headache: Secondary | ICD-10-CM | POA: Insufficient documentation

## 2016-06-22 DIAGNOSIS — S01112A Laceration without foreign body of left eyelid and periocular area, initial encounter: Secondary | ICD-10-CM | POA: Insufficient documentation

## 2016-06-22 DIAGNOSIS — T148XXA Other injury of unspecified body region, initial encounter: Secondary | ICD-10-CM | POA: Diagnosis not present

## 2016-06-22 DIAGNOSIS — W19XXXA Unspecified fall, initial encounter: Secondary | ICD-10-CM

## 2016-06-22 DIAGNOSIS — J45909 Unspecified asthma, uncomplicated: Secondary | ICD-10-CM | POA: Insufficient documentation

## 2016-06-22 DIAGNOSIS — W182XXA Fall in (into) shower or empty bathtub, initial encounter: Secondary | ICD-10-CM | POA: Diagnosis not present

## 2016-06-22 DIAGNOSIS — Y939 Activity, unspecified: Secondary | ICD-10-CM | POA: Insufficient documentation

## 2016-06-22 DIAGNOSIS — Y92002 Bathroom of unspecified non-institutional (private) residence single-family (private) house as the place of occurrence of the external cause: Secondary | ICD-10-CM | POA: Insufficient documentation

## 2016-06-22 DIAGNOSIS — Z87891 Personal history of nicotine dependence: Secondary | ICD-10-CM | POA: Insufficient documentation

## 2016-06-22 DIAGNOSIS — S299XXA Unspecified injury of thorax, initial encounter: Secondary | ICD-10-CM | POA: Diagnosis not present

## 2016-06-22 DIAGNOSIS — S79911A Unspecified injury of right hip, initial encounter: Secondary | ICD-10-CM | POA: Diagnosis not present

## 2016-06-22 DIAGNOSIS — Z8673 Personal history of transient ischemic attack (TIA), and cerebral infarction without residual deficits: Secondary | ICD-10-CM | POA: Insufficient documentation

## 2016-06-22 DIAGNOSIS — S199XXA Unspecified injury of neck, initial encounter: Secondary | ICD-10-CM | POA: Diagnosis not present

## 2016-06-22 DIAGNOSIS — S0993XA Unspecified injury of face, initial encounter: Secondary | ICD-10-CM | POA: Diagnosis not present

## 2016-06-22 DIAGNOSIS — Z85828 Personal history of other malignant neoplasm of skin: Secondary | ICD-10-CM | POA: Diagnosis not present

## 2016-06-22 DIAGNOSIS — S0990XA Unspecified injury of head, initial encounter: Secondary | ICD-10-CM | POA: Diagnosis not present

## 2016-06-22 DIAGNOSIS — Y999 Unspecified external cause status: Secondary | ICD-10-CM | POA: Diagnosis not present

## 2016-06-22 DIAGNOSIS — S0181XA Laceration without foreign body of other part of head, initial encounter: Secondary | ICD-10-CM

## 2016-06-22 DIAGNOSIS — S0010XA Contusion of unspecified eyelid and periocular area, initial encounter: Secondary | ICD-10-CM

## 2016-06-22 DIAGNOSIS — I10 Essential (primary) hypertension: Secondary | ICD-10-CM | POA: Diagnosis not present

## 2016-06-22 NOTE — ED Provider Notes (Signed)
[facility] DEPT Provider Note   CSN: [id number] Arrival date & time: 06/22/16  2212  By signing my name below, I, Hansel Feinstein, attest that this documentation has been prepared under the direction and in the presence of Kimorah Ridolfi, MD. Electronically Signed: Hansel Feinstein, ED Scribe. 06/22/16. 11:38 PM.   History   Chief Complaint Chief Complaint  Patient presents with  . Fall   LEVEL 5 CAVEAT: HPI and ROS limited due to intoxication and memory disorder   HPI Ashley [name] is a 75 y.o. female brought in by ambulance who presents to the Emergency Department in a C-collar for evaluation of injuries s/p fall that occurred earlier this evening. Per husband, pt fell in her master bathroom while she was at home by herself. Per husband, he arrived home and found the pt conscious on the floor in the bedroom, noting that she likely crawled from the bathroom. He states he saw blood near the edge of the bathtub and believes she struck her face on the edge as she fell. LOC is unknown. Pt presents with a laceration to the left eyebrow. Pt states she had a glass of scotch this evening, but did not have any of her regular daily medications or pain medication. Tdap UTD. Pt denies additional injuries.   The history is provided by a relative and the spouse. History limited by: memory disorder. No language interpreter was used.  Fall  This is a new problem. The current episode started 1 to 2 hours ago. The problem occurs constantly. The problem has not changed since onset.Pertinent negatives include no chest pain, no abdominal pain and no shortness of breath. Nothing aggravates the symptoms. Nothing relieves the symptoms. She has tried nothing for the symptoms. The treatment provided no relief.    Past Medical History:  Diagnosis Date  . Arthritis    back and neck  . Carotid artery stenosis   . Cerebral amyloid angiopathy (CODE) 06/20/2016  . Chronic rhinosinusitis   . DDD (degenerative disc  disease)   . Depression   . Diverticulosis of colon   . Frequency of urination   . Glaucoma   . Headache disorder 12/13/2014   Right temple  . Hearing loss   . History of kidney stones    MULTIBLE SURGICAL INTERVENTIONS  . History of skin cancer HX TOPICAL FACIAL [place] 2013  . History of TIA (transient ischemic attack)    NOV 2013-- NO RESIDUAL  . Hypertension   . IBS (irritable bowel syndrome)   . Memory difficulty 05/14/2016  . Memory loss   . Mild asthma    COLD INDUCED  . Right ureteral stone   . Seasonal and perennial allergic rhinitis   . Shingles    EYEBALL 09-17-2013  . Spondylosis     Patient Active Problem List   Diagnosis Date Noted  . Cerebral amyloid angiopathy (CODE) 06/20/2016  . Memory difficulty 05/14/2016  . Headache disorder 12/13/2014  . Chest wall pain 05/24/2012  . Other general symptoms  12/12/2011  . Iron deficiency anemia, unspecified  12/12/2011  . Diverticulosis of colon (without mention of hemorrhage) 12/06/2011  . History of IBS 12/06/2011  . Calcium oxalate kidney stones 12/06/2011  . S/P cholecystectomy 12/06/2011  . Bilateral lower abdominal pain 12/06/2011  . Right ureteral stone 07/22/2011  . Hyperparathyroidism, primary ([facility] [place]) 04/22/2011  . RHINOSINUSITIS, ACUTE 09/04/2007  . ASTHMATIC BRONCHITIS, ACUTE 09/03/2007  . Seasonal and perennial allergic rhinitis 09/03/2007    Past Surgical History:  Procedure  Laterality Date  . BUNIONECTOMY     BILATERAL  . CALDWELL LUC     sinus drainage procedure  . CYSTO/ BILATERAL URETEROSCOPIC STONE EXTRACTIONS  02-09-2003  . CYSTO/ BILATERAL URETEROSCOPY/  LASER OF STONES LEFT SIDE  06-02-2007  . CYSTOSCOPY/RETROGRADE/URETEROSCOPY/STONE EXTRACTION WITH BASKET  03/31/2012   Procedure: CYSTOSCOPY/RETROGRADE/URETEROSCOPY/STONE EXTRACTION WITH BASKET;  Surgeon: Malka So, MD;  Location: [facility];  Service: Urology;  Laterality: Right;   .  CYSTOSCOPY/RETROGRADE/URETEROSCOPY/STONE EXTRACTION WITH BASKET Right 03/29/2013   Procedure: CYSTOSCOPY//URETEROSCOPY/STONE EXTRACTION WITH BASKET;  Surgeon: Malka So, MD;  Location: [facility];  Service: Urology;  Laterality: Right;  . CYSTOSCOPY/RETROGRADE/URETEROSCOPY/STONE EXTRACTION WITH BASKET Right 10/14/2013   Procedure: CYSTOSCOPY/RETROGRADE/URETEROSCOPY/STONE EXTRACTION WITH BASKET;  Surgeon: Irine Seal, MD;  Location: [facility];  Service: Urology;  Laterality: Right;  . ESOPHAGOGASTRODUODENOSCOPY (EGD) WITH PROPOFOL N/A 04/12/2015   Procedure: ESOPHAGOGASTRODUODENOSCOPY (EGD) WITH PROPOFOL;  Surgeon: Arta Silence, MD;  Location: [facility];  Service: Endoscopy;  Laterality: N/A;  . EUS N/A 04/12/2015   Procedure: ESOPHAGEAL ENDOSCOPIC ULTRASOUND (EUS) RADIAL;  Surgeon: Arta Silence, MD;  Location: [facility];  Service: Endoscopy;  Laterality: N/A;  . EXTRACORPOREAL SHOCK WAVE LITHOTRIPSY  about [id number] Dr Reece Agar   Never completed procedure-could not tolerate pain. Had seizures due to pain  . EYE SURGERY  AGE 35   INJURY  . HERNIA REPAIR  AGE 3  . HOLMIUM LASER APPLICATION Right [id number]   Procedure: HOLMIUM LASER APPLICATION;  Surgeon: Irine Seal, MD;  Location: [facility];  Service: Urology;  Laterality: Right;  . LAPAROSCOPIC CHOLECYSTECTOMY  01-12-2005  . LEFT URETEROSCOPIC STONE EXTRACTION    LAST ONE 04-28-2009   SEVERAL SURGICAL STONE EXTRACTIONS  . LUMBAR LAMINECTOMY  09-30-2002   L4 - 5;  SPONDYLOLISTHISES W/ STENOSIS AND RADICULOPATHY  . PERCUTANEOUS NEPHROSTOLITHOTOMY  1975  . RIGHT URETEROSCOPIC STONE EXTRACTION  LAST ONE 02-21-2011   MULTIPLE SURGICAL STONE EXTRACTIONS  . TONSILECTOMY, ADENOIDECTOMY, BILATERAL MYRINGOTOMY AND TUBES  CHILD  . VAGINAL HYSTERECTOMY  1991   W/ BILATERAL SALPINGOOPHECTOMY    OB History    No data available       Home Medications    Prior to Admission medications   Medication Sig Start Date  End Date Taking? Authorizing Provider  aliskiren (TEKTURNA) 150 MG tablet Take 150 mg by mouth every evening.    Yes Historical Provider, MD  amoxicillin (AMOXIL) 500 MG capsule Take 500 mg by mouth as needed (for infections when traveling).     Historical Provider, MD  aspirin EC 81 MG tablet Take 162 mg by mouth 3 (three) times a week.    Historical Provider, MD  BENZONATATE PO Take by mouth as needed.    Historical Provider, MD  celecoxib (CELEBREX) 200 MG capsule Take 200 mg by mouth daily. 09/07/14   Historical Provider, MD  desipramine (NORPRAMIN) 10 MG tablet Take 2 tablets (20 mg total) by mouth at bedtime. 06/20/16   Kathrynn Ducking, MD  Docusate Calcium (STOOL SOFTENER PO) Take by mouth as needed (constipation).     Historical Provider, MD  donepezil (ARICEPT) 5 MG tablet Take 1 tablet (5 mg total) by mouth at bedtime. Patient not taking: Reported on 06/20/2016 05/27/16   Kathrynn Ducking, MD  fish oil-omega-3 fatty acids 1000 MG capsule Take 2 g by mouth daily.     Historical Provider, MD  HYDROcodone-acetaminophen (NORCO/VICODIN) 5-325 MG per tablet Take 1 tablet by mouth every 4 (four) hours as needed  for pain. For pain 03/29/13   Irine Seal, MD  metroNIDAZOLE (FLAGYL) 500 MG tablet as needed. 02/29/16   Historical Provider, MD  Multiple Vitamin (MULTIVITAMIN PO) Take 1 tablet by mouth daily.     Historical Provider, MD  phenazopyridine (PYRIDIUM) 200 MG tablet Take 1 tablet (200 mg total) by mouth 3 (three) times daily as needed for pain. 10/14/13   Irine Seal, MD  pravastatin (PRAVACHOL) 40 MG tablet Take 40 mg by mouth daily.  04/11/16   Historical Provider, MD  promethazine (PHENERGAN) 25 MG tablet Take 1 tablet (25 mg total) by mouth every 6 (six) hours as needed for nausea or vomiting. 10/14/13   Irine Seal, MD  Sulfamethoxazole-Trimethoprim (SULFAMETHOXAZOLE-TMP DS PO) Take by mouth as needed.    Historical Provider, MD  valACYclovir (VALTREX) 1000 MG tablet Take 1,000 mg by mouth 2  (two) times daily as needed (herpes).     Historical Provider, MD    Family History Family History  Problem Relation Age of Onset  . COPD Mother   . Breast cancer Mother   . Colon cancer Neg Hx   . Migraines Neg Hx     Social History Social History  Substance Use Topics  . Smoking status: Former Smoker    Years: 23.00    Types: Cigarettes    Quit date: 09/16/1985  . Smokeless tobacco: Never Used  . Alcohol use Yes     Comment: social - weekend    Allergies   Review of patient's allergies indicates no known allergies.   Review of Systems Review of Systems  Unable to perform ROS: Mental status change  Constitutional: Negative for fever.  Respiratory: Negative for shortness of breath.   Cardiovascular: Negative for chest pain.  Gastrointestinal: Negative for abdominal pain.  Neurological: Negative for facial asymmetry, weakness and numbness.     Physical Exam Updated Vital Signs BP 147/71   Pulse 89   Temp 97.6 F (36.4 C) (Oral)   Resp 20   SpO2 99%   Physical Exam  Constitutional: She appears well-developed and well-nourished.  Highly intoxicated.   HENT:  Head: Normocephalic.  Periorbital hematoma on the left side, but no battle's sign or racoon eyes. No hemotympanum on the left. Laceration of the lateral eyebrow on the left, horizontal in configuration, approximately 1 cm, hemostatic. No hemotympanum on the right. Facial bones stable.   Eyes: Conjunctivae and EOM are normal. Pupils are equal, round, and reactive to light.  Neck: Normal range of motion. Neck supple. No JVD present. No tracheal deviation present.  No carotid bruits. Trachea midline.   Cardiovascular: Normal rate, regular rhythm and normal heart sounds.  Exam reveals no gallop and no friction rub.   No murmur heard. RRR.   Pulmonary/Chest: Effort normal and breath sounds normal. No stridor. No respiratory distress. She has no wheezes. She has no rales.  Lungs CTA bilaterally.   Abdominal:  Soft. Bowel sounds are normal. She exhibits no distension. There is no rebound and no guarding.  Musculoskeletal: Normal range of motion. She exhibits no tenderness or deformity.  Pelvis stable. No foreshortening or external rotation. Intact DPs. All compartments soft. Achilles tendon intact bilaterally. No laxity of either knee to varus or valgus stress. Negative drawer test. Quadriceps and patellar tendons intact bilaterally. No snuff box tenderness. No deformity of BUE. No step-offs or crepitus of the C-spine.   Lymphadenopathy:    She has no cervical adenopathy.  Neurological: She is alert. She has normal reflexes.  Skin: Skin is warm and dry. Capillary refill takes less than 2 seconds.  Psychiatric: She has a normal mood and affect.  Nursing note and vitals reviewed.    ED Treatments / Results   Vitals:   06/22/16 2300 06/22/16 2315  BP: 147/71 157/80  Pulse: 89 92  Resp:    Temp:     Results for orders placed or performed in visit on 05/14/16  Copper, serum  Result Value Ref Range   Copper 129 72 - 166 ug/dL  RPR  Result Value Ref Range   RPR Ser Ql Non Reactive Non Reactive  Vitamin B12  Result Value Ref Range   Vitamin B-12 1,769 (H) 211 - 946 pg/mL  Sedimentation rate  Result Value Ref Range   Sed Rate 6 0 - 40 mm/hr   Dg Chest 1 View  Result Date: 06/22/2016 CLINICAL DATA:  [place] at home.  Probably struck face on the bathtub. EXAM: CHEST 1 VIEW COMPARISON:  03/31/2015 FINDINGS: Emphysematous changes in the lungs. Normal heart size and pulmonary vascularity. No focal airspace disease or consolidation in the lungs. No blunting of costophrenic angles. No pneumothorax. Mediastinal contours appear intact. Calcification of the aorta. IMPRESSION: Emphysematous changes in the lungs. No evidence of active pulmonary disease. Electronically Signed   By: Lucienne Capers M.D.   On: 06/22/2016 23:50   Mr Brain Wo Contrast  Result Date: 05/27/2016  [facility]  [address] [place], [place] [address] [phone] NEUROIMAGING REPORT STUDY DATE: 05/25/2016 PATIENT NAME: ANGALENA SANOR DOB: [birth year] MRN: [id number] EXAM: MRI Brain without contrast ORDERING CLINICIAN: Kathrynn Ducking M.D. CLINICAL HISTORY: 75 year old woman with memory loss COMPARISON FILMS: MRI 12/28/2014 TECHNIQUE: MRI of the brain without contrast was obtained utilizing 5 mm axial slices with T1, T2, T2 flair, SWI and diffusion weighted views.  T1 sagittal and T2 coronal views were obtained. CONTRAST: none IMAGING SITE: [facility], [place], [place] FINDINGS: On sagittal images, the spinal cord is imaged caudally to C4 and is normal in caliber.  Pannus formation is noted at C1-C2 with mild kinking of the cervical medullary junction per The contents of the posterior fossa are of normal size and position.   The pituitary gland and optic chiasm appear normal.    Brain volume appears normal.   The ventricles are normal in size and without distortion.  There are no abnormal extra-axial collections of fluid.  The cerebellum and brainstem appears normal.   The deep gray matter appears normal.  In the hemispheres, there are multiple T2/FLAIR hyperintense foci in the subcortical deep and periventricular white matter with confluencies noted in the periatrial white matter bilaterally. None of the foci appears to be acute. The pattern is stable when compared to the MRI from 12/28/2014.   Diffusion weighted images are normal.   Susceptibility weighted images show multiple wall microhemorrhages, predominantly in the cortex and subcortical white matter of both hemispheres. The orbits appear normal.   The VIIth/VIIIth nerve complex appears normal.  The mastoid air cells appear normal.  There has been prior right maxillary sinus surgery. No inflammatory changes are noted in the paranasal sinuses.   Flow voids are identified within the major intracerebral arteries.   Compared to the MRI of  the brain dated 12/28/2014, there is no definite interval change.    This MRI of the brain without contrast shows the following: 1.    Multiple cortical and subcortical microhemorrhages consistent with amyloid angiopathy. No definite change  when compared to the MRI from 12/28/2014. 2.    T2/FLAIR hyperintense foci, predominantly in the subcortical and deep white matter of both hemispheres consistent with chronic microvascular ischemic change, unchanged when compared to the 12/28/2014 MRI. 3.     Degenerative pannus at C1-C2. This is mild and slightly kinks the cervicomedullary junction. 4.    There are no acute findings. INTERPRETING PHYSICIAN: Richard A. Felecia Shelling, MD, PhD Certified in  Neuroimaging by [place] [employer]   Dg Hips Shaune Pascal Or Wo Pelvis 5 Views  Result Date: 06/22/2016 CLINICAL DATA:  [place] at home. EXAM: DG HIP (WITH OR WITHOUT PELVIS) 5+V BILAT COMPARISON:  None. FINDINGS: Pelvis appears intact. No acute fracture or dislocation. SI joints and symphysis pubis are not displaced. Right hip appears intact. No evidence of acute fracture or dislocation. No focal bone lesion or bone destruction. Left hip appears intact. No evidence of acute fracture or dislocation. No focal bone lesion or bone destruction. IMPRESSION: Negative. Electronically Signed   By: Lucienne Capers M.D.   On: 06/22/2016 23:53    DIAGNOSTIC STUDIES: Oxygen Saturation is 100% on RA, normal by my interpretation.    COORDINATION OF CARE: 11:17 PM Discussed treatment plan with pt at bedside which includes wound care, CT head, CT c-spine, CT maxillofacial, CXR, XR pelvis and pt agreed to plan.    Procedures Procedures (including critical care time)   Initial Impression / Assessment and Plan / ED Course  I have reviewed the triage vital signs and the nursing notes.  Pertinent labs & imaging results that were available during my care of the patient were reviewed by me and considered in my medical decision  making (see chart for details).   Final Clinical Impressions(s) / ED Diagnoses  laceration repaired by Delos Haring New Prescriptions New Prescriptions   No medications on file   All questions answered to patient's satisfaction. Based on history and exam patient has been appropriately medically screened and emergency conditions excluded. Patient is stable for discharge at this time. Follow up with your PMD for recheck in 2 days and strict return precautions given.   Suture removal at urgent care in 7 days.  Ice the affected areas, alleve in the morning and evening tylenol in the middle for break through pain.  No alcohol.  Lots of fluids.    I personally performed the services described in this documentation, which was scribed in my presence. The recorded information has been reviewed and is accurate.      Veatrice Kells, MD 06/23/16 [phone]

## 2016-06-22 NOTE — ED Notes (Signed)
Patient transported to X-ray 

## 2016-06-22 NOTE — ED Triage Notes (Signed)
Pt was at home and had a mechanical fall. Tripped over rug. Pt is anticoagulated. Pt is a+o. Unsure of LOC. Left sided hematoma

## 2016-06-23 ENCOUNTER — Encounter (HOSPITAL_COMMUNITY): Payer: Self-pay | Admitting: Emergency Medicine

## 2016-06-23 ENCOUNTER — Emergency Department (HOSPITAL_COMMUNITY): Payer: Medicare Other

## 2016-06-23 DIAGNOSIS — S0990XA Unspecified injury of head, initial encounter: Secondary | ICD-10-CM | POA: Diagnosis not present

## 2016-06-23 DIAGNOSIS — S199XXA Unspecified injury of neck, initial encounter: Secondary | ICD-10-CM | POA: Diagnosis not present

## 2016-06-23 DIAGNOSIS — S0993XA Unspecified injury of face, initial encounter: Secondary | ICD-10-CM | POA: Diagnosis not present

## 2016-06-23 DIAGNOSIS — S01112A Laceration without foreign body of left eyelid and periocular area, initial encounter: Secondary | ICD-10-CM | POA: Diagnosis not present

## 2016-06-23 MED ORDER — ACETAMINOPHEN 500 MG PO TABS
1000.0000 mg | ORAL_TABLET | Freq: Once | ORAL | Status: AC
  Administered 2016-06-23: 1000 mg via ORAL
  Filled 2016-06-23: qty 2

## 2016-06-23 MED ORDER — LIDOCAINE-EPINEPHRINE (PF) 2 %-1:200000 IJ SOLN: 10.0000 mL | Freq: Once | INTRAMUSCULAR | Status: DC

## 2016-06-23 MED ORDER — NAPROXEN 250 MG PO TABS: 500.0000 mg | ORAL_TABLET | Freq: Once | ORAL | Status: DC

## 2016-06-23 MED ORDER — KETOROLAC TROMETHAMINE 60 MG/2ML IM SOLN
60.0000 mg | Freq: Once | INTRAMUSCULAR | Status: AC
  Administered 2016-06-23: 60 mg via INTRAMUSCULAR
  Filled 2016-06-23: qty 2

## 2016-06-23 MED ORDER — LIDOCAINE-EPINEPHRINE 2 %-1:100000 IJ SOLN
20.0000 mL | Freq: Once | INTRAMUSCULAR | Status: DC
  Filled 2016-06-23: qty 20

## 2016-06-23 NOTE — ED Provider Notes (Signed)
LACERATION REPAIR Performed by: Linus Mako Authorized by: Linus Mako Consent: Verbal consent obtained. Risks and benefits: risks, benefits and alternatives were discussed Consent given by: patient Patient identity confirmed: provided demographic data Prepped and Draped in normal sterile fashion Wound explored  Laceration Location: left eyebrow  Laceration Length: 1 cm  No Foreign Bodies seen or palpated  Anesthesia: local infiltration  Local anesthetic: lidocaine 1% 1 epinephrine  Anesthetic total: 2 ml  Irrigation method: syringe Amount of cleaning: standard  Skin closure: sutures  Number of sutures: 3  Technique: simple interrupted  Patient tolerance: Patient tolerated the procedure well with no immediate complications.   For HPI, ROS, PE, PLAN please refer to Dr. Lynnae January note.   Delos Haring, PA-C 06/23/16 0216    April Palumbo, MD 06/23/16 [phone]

## 2016-06-23 NOTE — ED Notes (Signed)
Pt departed in NAD.  

## 2016-07-01 DIAGNOSIS — R413 Other amnesia: Secondary | ICD-10-CM | POA: Diagnosis not present

## 2016-07-01 DIAGNOSIS — S01119A Laceration without foreign body of unspecified eyelid and periocular area, initial encounter: Secondary | ICD-10-CM | POA: Diagnosis not present

## 2016-07-01 DIAGNOSIS — W19XXXA Unspecified fall, initial encounter: Secondary | ICD-10-CM | POA: Diagnosis not present

## 2016-07-01 DIAGNOSIS — Z4802 Encounter for removal of sutures: Secondary | ICD-10-CM | POA: Diagnosis not present

## 2016-07-05 DIAGNOSIS — N200 Calculus of kidney: Secondary | ICD-10-CM | POA: Diagnosis not present

## 2016-07-05 DIAGNOSIS — E119 Type 2 diabetes mellitus without complications: Secondary | ICD-10-CM | POA: Diagnosis not present

## 2016-07-05 DIAGNOSIS — Z9889 Other specified postprocedural states: Secondary | ICD-10-CM | POA: Diagnosis not present

## 2016-07-05 DIAGNOSIS — Z87442 Personal history of urinary calculi: Secondary | ICD-10-CM | POA: Diagnosis not present

## 2016-07-05 DIAGNOSIS — Z79899 Other long term (current) drug therapy: Secondary | ICD-10-CM | POA: Diagnosis not present

## 2016-07-05 DIAGNOSIS — I10 Essential (primary) hypertension: Secondary | ICD-10-CM | POA: Diagnosis not present

## 2016-07-05 DIAGNOSIS — R1031 Right lower quadrant pain: Secondary | ICD-10-CM | POA: Diagnosis not present

## 2016-07-14 ENCOUNTER — Emergency Department (HOSPITAL_COMMUNITY): Payer: Medicare Other

## 2016-07-14 ENCOUNTER — Observation Stay (HOSPITAL_COMMUNITY): Payer: Medicare Other

## 2016-07-14 ENCOUNTER — Encounter (HOSPITAL_COMMUNITY): Payer: Self-pay | Admitting: Emergency Medicine

## 2016-07-14 ENCOUNTER — Observation Stay (HOSPITAL_COMMUNITY)
Admission: EM | Admit: 2016-07-14 | Discharge: 2016-07-15 | Disposition: A | Discharge status: Home / Self Care | Payer: Medicare Other | Attending: Internal Medicine | Admitting: Internal Medicine

## 2016-07-14 DIAGNOSIS — R413 Other amnesia: Secondary | ICD-10-CM | POA: Insufficient documentation

## 2016-07-14 DIAGNOSIS — Z8673 Personal history of transient ischemic attack (TIA), and cerebral infarction without residual deficits: Secondary | ICD-10-CM | POA: Insufficient documentation

## 2016-07-14 DIAGNOSIS — I680 Cerebral amyloid angiopathy: Secondary | ICD-10-CM | POA: Diagnosis present

## 2016-07-14 DIAGNOSIS — M47892 Other spondylosis, cervical region: Secondary | ICD-10-CM | POA: Insufficient documentation

## 2016-07-14 DIAGNOSIS — Z87891 Personal history of nicotine dependence: Secondary | ICD-10-CM | POA: Diagnosis not present

## 2016-07-14 DIAGNOSIS — R4701 Aphasia: Principal | ICD-10-CM | POA: Diagnosis present

## 2016-07-14 DIAGNOSIS — I6782 Cerebral ischemia: Secondary | ICD-10-CM | POA: Insufficient documentation

## 2016-07-14 DIAGNOSIS — I6529 Occlusion and stenosis of unspecified carotid artery: Secondary | ICD-10-CM | POA: Diagnosis not present

## 2016-07-14 DIAGNOSIS — I10 Essential (primary) hypertension: Secondary | ICD-10-CM | POA: Diagnosis not present

## 2016-07-14 DIAGNOSIS — Z791 Long term (current) use of non-steroidal anti-inflammatories (NSAID): Secondary | ICD-10-CM | POA: Diagnosis not present

## 2016-07-14 DIAGNOSIS — F329 Major depressive disorder, single episode, unspecified: Secondary | ICD-10-CM | POA: Insufficient documentation

## 2016-07-14 DIAGNOSIS — N390 Urinary tract infection, site not specified: Secondary | ICD-10-CM | POA: Diagnosis not present

## 2016-07-14 DIAGNOSIS — R479 Unspecified speech disturbances: Secondary | ICD-10-CM | POA: Diagnosis not present

## 2016-07-14 DIAGNOSIS — E854 Organ-limited amyloidosis: Secondary | ICD-10-CM | POA: Diagnosis not present

## 2016-07-14 DIAGNOSIS — Z7982 Long term (current) use of aspirin: Secondary | ICD-10-CM | POA: Diagnosis not present

## 2016-07-14 DIAGNOSIS — Z79899 Other long term (current) drug therapy: Secondary | ICD-10-CM | POA: Diagnosis not present

## 2016-07-14 DIAGNOSIS — G459 Transient cerebral ischemic attack, unspecified: Secondary | ICD-10-CM

## 2016-07-14 LAB — I-STAT CHEM 8, ED
BUN: 15 mg/dL (ref 6–20)
CREATININE: 1 mg/dL (ref 0.44–1.00)
Calcium, Ion: 1.18 mmol/L (ref 1.15–1.40)
Chloride: 101 mmol/L (ref 101–111)
Glucose, Bld: 99 mg/dL (ref 65–99)
HEMATOCRIT: 41 % (ref 36.0–46.0)
Hemoglobin: 13.9 g/dL (ref 12.0–15.0)
POTASSIUM: 4 mmol/L (ref 3.5–5.1)
Sodium: 134 mmol/L — ABNORMAL LOW (ref 135–145)
TCO2: 29 mmol/L (ref 0–100)

## 2016-07-14 LAB — COMPREHENSIVE METABOLIC PANEL
ALBUMIN: 3.8 g/dL (ref 3.5–5.0)
ALK PHOS: 53 U/L (ref 38–126)
ALT: 18 U/L (ref 14–54)
AST: 26 U/L (ref 15–41)
Anion gap: 8 (ref 5–15)
BILIRUBIN TOTAL: 0.7 mg/dL (ref 0.3–1.2)
BUN: 13 mg/dL (ref 6–20)
CO2: 27 mmol/L (ref 22–32)
Calcium: 10.1 mg/dL (ref 8.9–10.3)
Chloride: 102 mmol/L (ref 101–111)
Creatinine, Ser: 1.07 mg/dL — ABNORMAL HIGH (ref 0.44–1.00)
GFR calc Af Amer: 58 mL/min — ABNORMAL LOW (ref >60)
GFR calc non Af Amer: 50 mL/min — ABNORMAL LOW (ref >60)
GLUCOSE: 106 mg/dL — AB (ref 65–99)
POTASSIUM: 4.1 mmol/L (ref 3.5–5.1)
Sodium: 137 mmol/L (ref 135–145)
TOTAL PROTEIN: 6.4 g/dL — AB (ref 6.5–8.1)

## 2016-07-14 LAB — URINALYSIS, ROUTINE W REFLEX MICROSCOPIC
BILIRUBIN URINE: NEGATIVE
GLUCOSE, UA: NEGATIVE mg/dL
KETONES UR: NEGATIVE mg/dL
Nitrite: NEGATIVE
PROTEIN: NEGATIVE mg/dL
Specific Gravity, Urine: 1.016 (ref 1.005–1.030)
pH: 6 (ref 5.0–8.0)

## 2016-07-14 LAB — CBC
HCT: 42.4 % (ref 36.0–46.0)
HEMOGLOBIN: 14.5 g/dL (ref 12.0–15.0)
MCH: 32.7 pg (ref 26.0–34.0)
MCHC: 34.2 g/dL (ref 30.0–36.0)
MCV: 95.7 fL (ref 78.0–100.0)
Platelets: 241 10*3/uL (ref 150–400)
RBC: 4.43 MIL/uL (ref 3.87–5.11)
RDW: 14 % (ref 11.5–15.5)
WBC: 7.7 10*3/uL (ref 4.0–10.5)

## 2016-07-14 LAB — DIFFERENTIAL
BASOS ABS: 0 10*3/uL (ref 0.0–0.1)
Basophils Relative: 0 %
EOS ABS: 0.2 10*3/uL (ref 0.0–0.7)
Eosinophils Relative: 3 %
LYMPHS ABS: 2.5 10*3/uL (ref 0.7–4.0)
LYMPHS PCT: 33 %
Monocytes Absolute: 0.6 10*3/uL (ref 0.1–1.0)
Monocytes Relative: 8 %
NEUTROS PCT: 56 %
Neutro Abs: 4.3 10*3/uL (ref 1.7–7.7)

## 2016-07-14 LAB — URINE MICROSCOPIC-ADD ON: Bacteria, UA: NONE SEEN

## 2016-07-14 LAB — PROTIME-INR
INR: 0.99
Prothrombin Time: 13.1 seconds (ref 11.4–15.2)

## 2016-07-14 LAB — APTT: APTT: 28 s (ref 24–36)

## 2016-07-14 LAB — RAPID URINE DRUG SCREEN, HOSP PERFORMED
AMPHETAMINES: NOT DETECTED
BARBITURATES: NOT DETECTED
Benzodiazepines: NOT DETECTED
COCAINE: NOT DETECTED
Opiates: NOT DETECTED
TETRAHYDROCANNABINOL: NOT DETECTED

## 2016-07-14 LAB — I-STAT TROPONIN, ED: Troponin i, poc: 0.01 ng/mL (ref 0.00–0.08)

## 2016-07-14 LAB — ETHANOL: Alcohol, Ethyl (B): <5 mg/dL (ref <5)

## 2016-07-14 NOTE — ED Notes (Signed)
Pt back from MRI.  Pt currently refusing to be transported up stairs.  Pt sts she does not want to be admitted.  MD made aware and spoke to pt.  Pt agreed to allow MD to review MRI results.  This RN spoke to patient at length about encouraging her to stay.  Pt waiting to speak again to EDP.

## 2016-07-14 NOTE — Consult Note (Signed)
Neurology Consultation Reason for Consult: Transient episodes of aphasia Referring Physician: Vanita Panda, R  CC: Transient episodes of aphasia  History is obtained from: Patient, husband  HPI: Ashley [name] is a 75 y.o. female with a history of cerebral amyloid angiopathy who presents with transient episodes of aphasia over the course of the day. She has had a total of 4 episodes, the longest lasting approximately 40 seconds. She has intact memory over the course of the episode, but knows that she cannot speak properly. Due to the recurrent nature of these episodes, she sought care in the emergency room today. She denies paresthesia, vision change, numbness or weakness.   ROS: A 14 point ROS was performed and is negative except as noted in the HPI.   Past Medical History:  Diagnosis Date  . Arthritis    back and neck  . Carotid artery stenosis   . Cerebral amyloid angiopathy (CODE) 06/20/2016  . Chronic rhinosinusitis   . DDD (degenerative disc disease)   . Depression   . Diverticulosis of colon   . Frequency of urination   . Glaucoma   . Headache disorder 12/13/2014   Right temple  . Hearing loss   . History of kidney stones    MULTIBLE SURGICAL INTERVENTIONS  . History of skin cancer HX TOPICAL FACIAL [place] 2013  . History of TIA (transient ischemic attack)    NOV 2013-- NO RESIDUAL  . Hypertension   . IBS (irritable bowel syndrome)   . Memory difficulty 05/14/2016  . Memory loss   . Mild asthma    COLD INDUCED  . Right ureteral stone   . Seasonal and perennial allergic rhinitis   . Shingles    EYEBALL 09-17-2013  . Spondylosis      Family History  Problem Relation Age of Onset  . COPD Mother   . Breast cancer Mother   . Colon cancer Neg Hx   . Migraines Neg Hx      Social History:  reports that she quit smoking about 30 years ago. Her smoking use included Cigarettes. She quit after 23.00 years of use. She has never used smokeless tobacco. She reports that  she drinks alcohol. She reports that she does not use drugs.   Exam: Current vital signs: BP 150/99   Pulse 83   Temp 98.2 F (36.8 C)   Resp 16   Ht 5\' 1"  (1.549 m)   Wt 64 kg (141 lb)   SpO2 100%   BMI 26.64 kg/m  Vital signs in last 24 hours: Temp:  [98.2 F (36.8 C)] 98.2 F (36.8 C) (10/29 1934) Pulse Rate:  [79-98] 83 (10/29 2100) Resp:  [11-24] 16 (10/29 2100) BP: (143-168)/(75-99) 150/99 (10/29 2100) SpO2:  [98 %-100 %] 100 % (10/29 2100) Weight:  [64 kg (141 lb)] 64 kg (141 lb) (10/29 1946)   Physical Exam  Constitutional: Appears well-developed and well-nourished.  Psych: Affect appropriate to situation Eyes: No scleral injection HENT: No OP obstrucion Head: Normocephalic.  Cardiovascular: Normal rate and regular rhythm.  Respiratory: Effort normal and breath sounds normal to anterior ascultation GI: Soft.  No distension. There is no tenderness.  Skin: WDI  Neuro: Mental Status: Patient is awake, alert, oriented to person, place, month, year, and situation. Patient is able to give a clear and coherent history. No signs of aphasia or neglect Cranial Nerves: II: Visual Fields are full. Pupils are equal, round, and reactive to light.   III,IV, VI: EOMI without ptosis or  diploplia.  V: Facial sensation is symmetric to temperature VII: Facial movement is symmetric.  VIII: hearing is intact to voice X: Uvula elevates symmetrically XI: Shoulder shrug is symmetric. XII: tongue is midline without atrophy or fasciculations.  Motor: Tone is normal. Bulk is normal. 5/5 strength was present in all four extremities.  Sensory: Sensation is symmetric to light touch and temperature in the arms and legs. Cerebellar: No clear ataxia   I have reviewed labs in epic and the results pertinent to this consultation are: Borderline creatinine  I have reviewed the images obtained: MRI brain from September- multiple microhemorrhages consistent with amyloid  Impression:  75 year old female with cerebral amyloidosis with transient focal neurological event(TNFE). Possibilities include focal seizure, TIA, or "amyloid spell." Even though TIA is in the differential with her history of cerebral amyloidosis, I would not recommend antiplatelet therapies at this time. If she were to have a tight stenosis in the ICA or MCA, however, this risk/benefit ratio may change.  Recommendations: 1) MRI brain, MRA head 2) EEG 3) avoid antiplatelets 4) LDL, A1c 5) carotid Dopplers 6) neurology will follow.   Roland Rack, MD [facility] [phone]  If 7pm- 7am, please page neurology on call as listed in [place].

## 2016-07-14 NOTE — Progress Notes (Signed)
Patient arrived from  E.D alert and oriented X4 Speech clear at this time oriented to unit and room Telemetry attached and verified. I.V team in to start I.V

## 2016-07-14 NOTE — ED Notes (Signed)
IV x1 attempted however unsuccessful.

## 2016-07-14 NOTE — ED Triage Notes (Signed)
Pt arrives with three episodes of aphasia today, states no weakness. States hx of strokes. No neuro deficits in triage at this time. Last known well noon today.

## 2016-07-14 NOTE — ED Provider Notes (Signed)
[facility] DEPT Provider Note   CSN: [id number] Arrival date & time: 07/14/16  [id number]     History   Chief Complaint Chief Complaint  Patient presents with  . Transient Ischemic Attack    possible, three episodes of aphasia    HPI Ashley [name] is a 75 y.o. female.  HPI  Patient presents after 4 episodes of expressive aphasia. Patient has multiple medical issues including prior TIA, but has been doing generally well, after a fall that occurred about 3 weeks ago. Today, without clear precipitant the patient had 4 brief episodes of nonsustained expressive aphasia. Patient's husband witnessed one of these events. He is here currently. They both agreed that the patient is back to her baseline state of cognition, with no ongoing weakness, speech difficulty, confusion or disorientation. No recent medication changes, patient takes aspirin for stroke prophylaxis.   Past Medical History:  Diagnosis Date  . Arthritis    back and neck  . Carotid artery stenosis   . Cerebral amyloid angiopathy (CODE) 06/20/2016  . Chronic rhinosinusitis   . DDD (degenerative disc disease)   . Depression   . Diverticulosis of colon   . Frequency of urination   . Glaucoma   . Headache disorder 12/13/2014   Right temple  . Hearing loss   . History of kidney stones    MULTIBLE SURGICAL INTERVENTIONS  . History of skin cancer HX TOPICAL FACIAL [place] 2013  . History of TIA (transient ischemic attack)    NOV 2013-- NO RESIDUAL  . Hypertension   . IBS (irritable bowel syndrome)   . Memory difficulty 05/14/2016  . Memory loss   . Mild asthma    COLD INDUCED  . Right ureteral stone   . Seasonal and perennial allergic rhinitis   . Shingles    EYEBALL 09-17-2013  . Spondylosis     Patient Active Problem List   Diagnosis Date Noted  . Cerebral amyloid angiopathy (CODE) 06/20/2016  . Memory difficulty 05/14/2016  . Headache disorder 12/13/2014  . Chest wall pain 05/24/2012  . Other  general symptoms  12/12/2011  . Iron deficiency anemia, unspecified  12/12/2011  . Diverticulosis of colon (without mention of hemorrhage) 12/06/2011  . History of IBS 12/06/2011  . Calcium oxalate kidney stones 12/06/2011  . S/P cholecystectomy 12/06/2011  . Bilateral lower abdominal pain 12/06/2011  . Right ureteral stone 07/22/2011  . Hyperparathyroidism, primary ([place]) 04/22/2011  . RHINOSINUSITIS, ACUTE 09/04/2007  . ASTHMATIC BRONCHITIS, ACUTE 09/03/2007  . Seasonal and perennial allergic rhinitis 09/03/2007    Past Surgical History:  Procedure Laterality Date  . BUNIONECTOMY     BILATERAL  . CALDWELL LUC     sinus drainage procedure  . CYSTO/ BILATERAL URETEROSCOPIC STONE EXTRACTIONS  02-09-2003  . CYSTO/ BILATERAL URETEROSCOPY/  LASER OF STONES LEFT SIDE  06-02-2007  . CYSTOSCOPY/RETROGRADE/URETEROSCOPY/STONE EXTRACTION WITH BASKET  03/31/2012   Procedure: CYSTOSCOPY/RETROGRADE/URETEROSCOPY/STONE EXTRACTION WITH BASKET;  Surgeon: Malka So, MD;  Location: [facility];  Service: Urology;  Laterality: Right;   . CYSTOSCOPY/RETROGRADE/URETEROSCOPY/STONE EXTRACTION WITH BASKET Right 03/29/2013   Procedure: CYSTOSCOPY//URETEROSCOPY/STONE EXTRACTION WITH BASKET;  Surgeon: Malka So, MD;  Location: [facility];  Service: Urology;  Laterality: Right;  . CYSTOSCOPY/RETROGRADE/URETEROSCOPY/STONE EXTRACTION WITH BASKET Right 10/14/2013   Procedure: CYSTOSCOPY/RETROGRADE/URETEROSCOPY/STONE EXTRACTION WITH BASKET;  Surgeon: Irine Seal, MD;  Location: [facility];  Service: Urology;  Laterality: Right;  . ESOPHAGOGASTRODUODENOSCOPY (EGD) WITH PROPOFOL N/A 04/12/2015   Procedure: ESOPHAGOGASTRODUODENOSCOPY (EGD) WITH PROPOFOL;  Surgeon:  Arta Silence, MD;  Location: [facility];  Service: Endoscopy;  Laterality: N/A;  . EUS N/A 04/12/2015   Procedure: ESOPHAGEAL ENDOSCOPIC ULTRASOUND (EUS) RADIAL;  Surgeon: Arta Silence, MD;  Location: [facility];  Service:  Endoscopy;  Laterality: N/A;  . EXTRACORPOREAL SHOCK WAVE LITHOTRIPSY  about [id number] Dr Reece Agar   Never completed procedure-could not tolerate pain. Had seizures due to pain  . EYE SURGERY  AGE 89   INJURY  . HERNIA REPAIR  AGE 45  . HOLMIUM LASER APPLICATION Right [id number]   Procedure: HOLMIUM LASER APPLICATION;  Surgeon: Irine Seal, MD;  Location: [facility];  Service: Urology;  Laterality: Right;  . LAPAROSCOPIC CHOLECYSTECTOMY  01-12-2005  . LEFT URETEROSCOPIC STONE EXTRACTION    LAST ONE 04-28-2009   SEVERAL SURGICAL STONE EXTRACTIONS  . LUMBAR LAMINECTOMY  09-30-2002   L4 - 5;  SPONDYLOLISTHISES W/ STENOSIS AND RADICULOPATHY  . PERCUTANEOUS NEPHROSTOLITHOTOMY  1975  . RIGHT URETEROSCOPIC STONE EXTRACTION  LAST ONE 02-21-2011   MULTIPLE SURGICAL STONE EXTRACTIONS  . TONSILECTOMY, ADENOIDECTOMY, BILATERAL MYRINGOTOMY AND TUBES  CHILD  . VAGINAL HYSTERECTOMY  1991   W/ BILATERAL SALPINGOOPHECTOMY    OB History    No data available       Home Medications    Prior to Admission medications   Medication Sig Start Date End Date Taking? Authorizing Provider  aliskiren (TEKTURNA) 150 MG tablet Take 150 mg by mouth every evening.     Historical Provider, MD  amoxicillin (AMOXIL) 500 MG capsule Take 500 mg by mouth as needed (for infections when traveling).     Historical Provider, MD  aspirin EC 81 MG tablet Take 162 mg by mouth 3 (three) times a week.    Historical Provider, MD  BENZONATATE PO Take by mouth as needed.    Historical Provider, MD  celecoxib (CELEBREX) 200 MG capsule Take 200 mg by mouth daily. 09/07/14   Historical Provider, MD  desipramine (NORPRAMIN) 10 MG tablet Take 2 tablets (20 mg total) by mouth at bedtime. 06/20/16   Kathrynn Ducking, MD  Docusate Calcium (STOOL SOFTENER PO) Take by mouth as needed (constipation).     Historical Provider, MD  donepezil (ARICEPT) 5 MG tablet Take 1 tablet (5 mg total) by mouth at bedtime. Patient not taking:  Reported on 06/20/2016 05/27/16   Kathrynn Ducking, MD  fish oil-omega-3 fatty acids 1000 MG capsule Take 2 g by mouth daily.     Historical Provider, MD  HYDROcodone-acetaminophen (NORCO/VICODIN) 5-325 MG per tablet Take 1 tablet by mouth every 4 (four) hours as needed for pain. For pain 03/29/13   Irine Seal, MD  metroNIDAZOLE (FLAGYL) 500 MG tablet as needed. 02/29/16   Historical Provider, MD  Multiple Vitamin (MULTIVITAMIN PO) Take 1 tablet by mouth daily.     Historical Provider, MD  phenazopyridine (PYRIDIUM) 200 MG tablet Take 1 tablet (200 mg total) by mouth 3 (three) times daily as needed for pain. 10/14/13   Irine Seal, MD  pravastatin (PRAVACHOL) 40 MG tablet Take 40 mg by mouth daily.  04/11/16   Historical Provider, MD  promethazine (PHENERGAN) 25 MG tablet Take 1 tablet (25 mg total) by mouth every 6 (six) hours as needed for nausea or vomiting. 10/14/13   Irine Seal, MD  Sulfamethoxazole-Trimethoprim (SULFAMETHOXAZOLE-TMP DS PO) Take by mouth as needed.    Historical Provider, MD  valACYclovir (VALTREX) 1000 MG tablet Take 1,000 mg by mouth 2 (two) times daily as needed (herpes).  Historical Provider, MD    Family History Family History  Problem Relation Age of Onset  . COPD Mother   . Breast cancer Mother   . Colon cancer Neg Hx   . Migraines Neg Hx     Social History Social History  Substance Use Topics  . Smoking status: Former Smoker    Years: 23.00    Types: Cigarettes    Quit date: 09/16/1985  . Smokeless tobacco: Never Used  . Alcohol use Yes     Comment: social - weekend     Allergies   Review of patient's allergies indicates no known allergies.   Review of Systems Review of Systems  Constitutional:       Per HPI, otherwise negative  HENT:       Per HPI, otherwise negative  Respiratory:       Per HPI, otherwise negative  Cardiovascular:       Per HPI, otherwise negative  Gastrointestinal: Negative for vomiting.  Endocrine:       Negative aside from  HPI  Genitourinary:       Neg aside from HPI   Musculoskeletal:       Per HPI, otherwise negative  Skin: Negative.   Neurological: Positive for speech difficulty. Negative for syncope.  Psychiatric/Behavioral:       Recent diagnosis of memory loss, short-term.     Physical Exam Updated Vital Signs BP 161/77   Pulse 87   Temp 98.2 F (36.8 C)   Resp 14   Ht 5\' 1"  (1.549 m)   Wt 141 lb (64 kg)   SpO2 98%   BMI 26.64 kg/m   Physical Exam  Constitutional: She is oriented to person, place, and time. She appears well-developed and well-nourished. No distress.  HENT:  Head: Normocephalic and atraumatic.  Eyes: Conjunctivae and EOM are normal.  Cardiovascular: Normal rate and regular rhythm.   Pulmonary/Chest: Effort normal and breath sounds normal. No stridor. No respiratory distress.  Abdominal: She exhibits no distension.  Musculoskeletal: She exhibits no edema.  Neurological: She is alert and oriented to person, place, and time. No cranial nerve deficit. She exhibits normal muscle tone. Coordination normal.  Skin: Skin is warm and dry.  Psychiatric: She has a normal mood and affect.  Nursing note and vitals reviewed.    ED Treatments / Results  Labs (all labs ordered are listed, but only abnormal results are displayed) Labs Reviewed  COMPREHENSIVE METABOLIC PANEL - Abnormal; Notable for the following:       Result Value   Glucose, Bld 106 (*)    Creatinine, Ser 1.07 (*)    Total Protein 6.4 (*)    GFR calc non Af Amer 50 (*)    GFR calc Af Amer 58 (*)    All other components within normal limits  URINALYSIS, ROUTINE W REFLEX MICROSCOPIC (NOT AT [facility]) - Abnormal; Notable for the following:    APPearance HAZY (*)    Hgb urine dipstick MODERATE (*)    Leukocytes, UA MODERATE (*)    All other components within normal limits  URINE MICROSCOPIC-ADD ON - Abnormal; Notable for the following:    Squamous Epithelial / LPF 0-5 (*)    Crystals CA OXALATE CRYSTALS (*)     All other components within normal limits  I-STAT CHEM 8, ED - Abnormal; Notable for the following:    Sodium 134 (*)    All other components within normal limits  ETHANOL  PROTIME-INR  APTT  CBC  DIFFERENTIAL  RAPID URINE DRUG SCREEN, HOSP PERFORMED  I-STAT TROPOININ, ED    EKG  EKG Interpretation  Date/Time:  Sunday July 14 2016 19:06:42 EDT Ventricular Rate:  86 PR Interval:  144 QRS Duration: 72 QT Interval:  366 QTC Calculation: 437 R Axis:   86 Text Interpretation:  Normal sinus rhythm Artifact No significant change since last tracing Borderline ECG Confirmed by Carmin Muskrat  MD ([id number]) on 07/14/2016 7:16:43 PM       Radiology Ct Head Wo Contrast  Result Date: 07/14/2016 CLINICAL DATA:  Aphasia. EXAM: CT HEAD WITHOUT CONTRAST TECHNIQUE: Contiguous axial images were obtained from the base of the skull through the vertex without intravenous contrast. COMPARISON:  CT scan of June 22, 2016. FINDINGS: Brain: Minimal chronic ischemic white matter disease is noted. No mass effect or midline shift is noted. Ventricular size is within normal limits. There is no evidence of mass lesion, hemorrhage or acute infarction. Vascular: Atherosclerosis of carotid siphons is noted. Skull: Bony calvarium appears intact. Sinuses/Orbits: Paranasal sinuses are unremarkable. Other: None. IMPRESSION: Minimal chronic ischemic white matter disease. No acute intracranial abnormality seen. Electronically Signed   By: Marijo Conception, M.D.   On: 07/14/2016 20:18    Procedures Procedures (including critical care time)  Medications Ordered in ED Medications - No data to display  MRI May 25, 2016 IMPRESSION:  This MRI of the brain without contrast shows the following: 1.    Multiple cortical and subcortical microhemorrhages consistent with amyloid angiopathy. No definite change when compared to the MRI from 12/28/2014. 2.    T2/FLAIR hyperintense foci, predominantly in the subcortical  and deep white matter of both hemispheres consistent with chronic microvascular ischemic change, unchanged when compared to the 12/28/2014 MRI. 3.     Degenerative pannus at C1-C2. This is mild and slightly kinks the cervicomedullary junction. 4.    There are no acute findings.   After my initial evaluation I discussed the patient's presentation with our neurology colleagues. Given concern for episodic excessive aphasia, and her known baseline pathology, patient will have MRI, be admitted for further evaluation, management. Initial Impression / Assessment and Plan / ED Course  I have reviewed the triage vital signs and the nursing notes.  Pertinent labs & imaging results that were available during my care of the patient were reviewed by me and considered in my medical decision making (see chart for details).  Clinical Course    Elderly female with history of TIA, amyloid angiopathy presents after episodes of expressive aphasia.  Here the patient is awake, alert, back to baseline cognition. However, given her multiple issues, as well as multiple episodes of expressive aphasia today, there is some concern for TIA. Patient's initial studies were reassuring, but the patient required admission for further evaluation and management.  Final Clinical Impressions(s) / ED Diagnoses  Expressive aphasia   Carmin Muskrat, MD 07/14/16 2048

## 2016-07-14 NOTE — ED Notes (Signed)
Patient transported to CT 

## 2016-07-14 NOTE — ED Notes (Signed)
Patient returned from CT

## 2016-07-14 NOTE — ED Notes (Signed)
Pt had 4 episode of expressive aphasia all today. NIH 0, next neuro check 2300 hrs, vitally stable, no deficits. Pt passed swallow screen.   Pt going to MRI. Will be brought to the floor after.

## 2016-07-14 NOTE — ED Notes (Signed)
Pt requested IV be removed and started somewhere else.

## 2016-07-15 ENCOUNTER — Observation Stay (HOSPITAL_BASED_OUTPATIENT_CLINIC_OR_DEPARTMENT_OTHER)
Admit: 2016-07-15 | Discharge: 2016-07-15 | Disposition: A | Discharge status: Home / Self Care | Payer: Medicare Other | Attending: Internal Medicine | Admitting: Internal Medicine

## 2016-07-15 ENCOUNTER — Observation Stay (HOSPITAL_COMMUNITY): Payer: Medicare Other

## 2016-07-15 ENCOUNTER — Encounter (HOSPITAL_COMMUNITY): Payer: Self-pay | Admitting: *Deleted

## 2016-07-15 ENCOUNTER — Observation Stay (HOSPITAL_BASED_OUTPATIENT_CLINIC_OR_DEPARTMENT_OTHER): Payer: Medicare Other

## 2016-07-15 DIAGNOSIS — I10 Essential (primary) hypertension: Secondary | ICD-10-CM

## 2016-07-15 DIAGNOSIS — G459 Transient cerebral ischemic attack, unspecified: Secondary | ICD-10-CM | POA: Diagnosis not present

## 2016-07-15 DIAGNOSIS — R479 Unspecified speech disturbances: Secondary | ICD-10-CM | POA: Diagnosis not present

## 2016-07-15 DIAGNOSIS — R4701 Aphasia: Secondary | ICD-10-CM

## 2016-07-15 DIAGNOSIS — G458 Other transient cerebral ischemic attacks and related syndromes: Secondary | ICD-10-CM | POA: Diagnosis not present

## 2016-07-15 LAB — CBC
HCT: 39.7 % (ref 36.0–46.0)
Hemoglobin: 13.4 g/dL (ref 12.0–15.0)
MCH: 32.2 pg (ref 26.0–34.0)
MCHC: 33.8 g/dL (ref 30.0–36.0)
MCV: 95.4 fL (ref 78.0–100.0)
Platelets: 196 10*3/uL (ref 150–400)
RBC: 4.16 MIL/uL (ref 3.87–5.11)
RDW: 14.1 % (ref 11.5–15.5)
WBC: 6.1 10*3/uL (ref 4.0–10.5)

## 2016-07-15 LAB — COMPREHENSIVE METABOLIC PANEL
ALT: 16 U/L (ref 14–54)
AST: 22 U/L (ref 15–41)
Albumin: 3.3 g/dL — ABNORMAL LOW (ref 3.5–5.0)
Alkaline Phosphatase: 45 U/L (ref 38–126)
Anion gap: 7 (ref 5–15)
BUN: 11 mg/dL (ref 6–20)
CO2: 25 mmol/L (ref 22–32)
Calcium: 9.2 mg/dL (ref 8.9–10.3)
Chloride: 107 mmol/L (ref 101–111)
Creatinine, Ser: 0.95 mg/dL (ref 0.44–1.00)
GFR calc Af Amer: >60 mL/min (ref >60)
GFR calc non Af Amer: 58 mL/min — ABNORMAL LOW (ref >60)
Glucose, Bld: 110 mg/dL — ABNORMAL HIGH (ref 65–99)
Potassium: 3.9 mmol/L (ref 3.5–5.1)
Sodium: 139 mmol/L (ref 135–145)
Total Bilirubin: 0.9 mg/dL (ref 0.3–1.2)
Total Protein: 5.6 g/dL — ABNORMAL LOW (ref 6.5–8.1)

## 2016-07-15 LAB — LIPID PANEL
Cholesterol: 170 mg/dL (ref 0–200)
HDL: 59 mg/dL (ref >40)
LDL Cholesterol: 90 mg/dL (ref 0–99)
Total CHOL/HDL Ratio: 2.9 RATIO
Triglycerides: 106 mg/dL (ref <150)
VLDL: 21 mg/dL (ref 0–40)

## 2016-07-15 MED ORDER — PRAVASTATIN SODIUM 40 MG PO TABS
40.0000 mg | ORAL_TABLET | Freq: Every day | ORAL | Status: DC
  Administered 2016-07-15: 40 mg via ORAL
  Filled 2016-07-15: qty 1

## 2016-07-15 MED ORDER — OMEGA-3-ACID ETHYL ESTERS 1 G PO CAPS
2.0000 g | ORAL_CAPSULE | Freq: Every day | ORAL | Status: DC
  Administered 2016-07-15: 2 g via ORAL
  Filled 2016-07-15: qty 2

## 2016-07-15 MED ORDER — STROKE: EARLY STAGES OF RECOVERY BOOK
Freq: Once | Status: AC
  Administered 2016-07-15: 03:00:00
  Filled 2016-07-15: qty 1

## 2016-07-15 MED ORDER — CEFUROXIME AXETIL 500 MG PO TABS: 500.0000 mg | ORAL_TABLET | Freq: Two times a day (BID) | ORAL | 0 refills | Status: DC

## 2016-07-15 MED ORDER — HYDROCODONE-ACETAMINOPHEN 5-325 MG PO TABS: 1.0000 | ORAL_TABLET | ORAL | Status: DC | PRN

## 2016-07-15 MED ORDER — ZOLPIDEM TARTRATE 5 MG PO TABS
5.0000 mg | ORAL_TABLET | Freq: Once | ORAL | Status: AC
  Administered 2016-07-15: 5 mg via ORAL
  Filled 2016-07-15: qty 1

## 2016-07-15 MED ORDER — ALISKIREN FUMARATE 150 MG PO TABS
150.0000 mg | ORAL_TABLET | Freq: Every evening | ORAL | Status: DC
  Filled 2016-07-15: qty 1

## 2016-07-15 MED ORDER — DESIPRAMINE HCL 25 MG PO TABS
25.0000 mg | ORAL_TABLET | Freq: Every day | ORAL | Status: DC
  Administered 2016-07-15: 25 mg via ORAL
  Filled 2016-07-15 (×2): qty 1

## 2016-07-15 MED ORDER — DEXTROSE 5 % IV SOLN
1.0000 g | Freq: Every day | INTRAVENOUS | Status: DC
  Administered 2016-07-15: 1 g via INTRAVENOUS
  Filled 2016-07-15: qty 10

## 2016-07-15 MED ORDER — SODIUM CHLORIDE 0.9 % IV SOLN
INTRAVENOUS | Status: DC
  Administered 2016-07-15: 02:00:00 via INTRAVENOUS

## 2016-07-15 NOTE — Progress Notes (Signed)
Patient requesting for a sleep aid Provider paged.

## 2016-07-15 NOTE — Progress Notes (Signed)
*  PRELIMINARY RESULTS* Vascular Ultrasound Carotid Duplex (Doppler) has been completed.  Findings suggest 1-39% right internal carotid artery stenosis and 60-79% left internal carotid artery stenosis. Vertebral arteries are patent with antegrade flow.  07/15/2016 3:14 PM Maudry Mayhew, BS, RVT, RDCS, RDMS

## 2016-07-15 NOTE — Evaluation (Signed)
Physical Therapy Evaluation Patient Details Name: Ashley [name] MRN: [id number] DOB: [birth year] Today's Date: 07/15/2016   History of Present Illness  pt presents for work up for possible TIA due to transient expressive deficits.  pt with hx of HTN, Cerebral Amyloid Angiopathy, Depression, Glaucoma, TIA, Memory Deficits, Shingles, and Back Surgery.    Clinical Impression  Pt moving well and only deficit noted is that pt is moving slower than baseline for her due to fatigue from being in ED through the night and multiple tests already this am.  No PT needs noted at this time.  Will sign off.      Follow Up Recommendations No PT follow up    Equipment Recommendations  None recommended by PT    Recommendations for Other Services       Precautions / Restrictions Precautions Precautions: None Restrictions Weight Bearing Restrictions: No      Mobility  Bed Mobility Overal bed mobility: Modified Independent                Transfers Overall transfer level: Modified independent Equipment used: None                Ambulation/Gait Ambulation/Gait assistance: Modified independent (Device/Increase time) Ambulation Distance (Feet): 200 Feet Assistive device: None Gait Pattern/deviations: Step-through pattern;Decreased stride length     General Gait Details: pt indicates moving slower than normal, but otherwise no deficits noted.    Stairs Stairs: Yes Stairs assistance: Modified independent (Device/Increase time) Stair Management: One rail Left;Alternating pattern;Forwards Number of Stairs: 5 General stair comments: No deficits noted.  number of steps limited as pt hooked to IV pole.    Wheelchair Mobility    Modified Rankin (Stroke Patients Only) Modified Rankin (Stroke Patients Only) Pre-Morbid Rankin Score: No significant disability Modified Rankin: No significant disability     Balance Overall balance assessment: Modified Independent                                            Pertinent Vitals/Pain Pain Assessment: No/denies pain    Home Living Family/patient expects to be discharged to:: Private residence Living Arrangements: Spouse/significant other Available Help at Discharge: Family;Available PRN/intermittently Type of Home: House       Home Layout: Two level Home Equipment: None      Prior Function Level of Independence: Independent         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        Extremity/Trunk Assessment   Upper Extremity Assessment: Overall WFL for tasks assessed           Lower Extremity Assessment: Overall WFL for tasks assessed      Cervical / Trunk Assessment: Normal  Communication   Communication: No difficulties  Cognition Arousal/Alertness: Awake/alert Behavior During Therapy: WFL for tasks assessed/performed Overall Cognitive Status: Within Functional Limits for tasks assessed                      General Comments      Exercises     Assessment/Plan    PT Assessment Patent does not need any further PT services  PT Problem List            PT Treatment Interventions      PT Goals (Current goals can be found in the Care Plan section)  Acute Rehab PT  Goals Patient Stated Goal: Home PT Goal Formulation: All assessment and education complete, DC therapy    Frequency     Barriers to discharge        Co-evaluation               End of Session   Activity Tolerance: Patient tolerated treatment well Patient left: in bed;with call bell/phone within reach Nurse Communication: Mobility status    Functional Assessment Tool Used: Clinical Judgement Functional Limitation: Mobility: Walking and moving around Mobility: Walking and Moving Around Current Status [id number]): 0 percent impaired, limited or restricted Mobility: Walking and Moving Around Goal Status [phone]): 0 percent impaired, limited or restricted Mobility:  Walking and Moving Around Discharge Status [id number]): 0 percent impaired, limited or restricted    Time: [id number] PT Time Calculation (min) (ACUTE ONLY): 19 min   Charges:   PT Evaluation $PT Eval Low Complexity: 1 Procedure     PT G Codes:   PT G-Codes **NOT FOR INPATIENT CLASS** Functional Assessment Tool Used: Clinical Judgement Functional Limitation: Mobility: Walking and moving around Mobility: Walking and Moving Around Current Status [id number]): 0 percent impaired, limited or restricted Mobility: Walking and Moving Around Goal Status [id number]): 0 percent impaired, limited or restricted Mobility: Walking and Moving Around Discharge Status [id number]): 0 percent impaired, limited or restricted    Catarina Hartshorn, [place] 07/15/2016, 10:06 AM

## 2016-07-15 NOTE — Procedures (Signed)
ELECTROENCEPHALOGRAM REPORT  Date of Study: 07/15/2016  Patient's Name: Ashley [name] MRN: [id number] Date of Birth: June 01, 1941  Referring Provider: Dr. Gean Birchwood  Clinical History: This is a 75 year old woman with transient expressive aphasia.  Medications: aliskiren (TEKTURNA) tablet 150 mg  cefTRIAXone (ROCEPHIN) 1 g in dextrose 5 % 50 mL IVPB  desipramine (NORPRAMIN) tablet 25 mg  HYDROcodone-acetaminophen (NORCO/VICODIN) 5-325 MG per tablet 1 tablet  omega-3 acid ethyl esters (LOVAZA) capsule 2 g  pravastatin (PRAVACHOL) tablet 40 mg   Technical Summary: A multichannel digital EEG recording measured by the international 10-20 system with electrodes applied with paste and impedances below 5000 ohms performed in our laboratory with EKG monitoring in an awake and asleep patient.  Hyperventilation was not performed. Photic stimulation was performed.  The digital EEG was referentially recorded, reformatted, and digitally filtered in a variety of bipolar and referential montages for optimal display.    Description: The patient is awake and asleep during the recording.  During maximal wakefulness, there is a symmetric, medium voltage 9 Hz posterior dominant rhythm that poorly attenuates to eye opening and eye closure.  There is occasional focal 2-3 Hz delta slowing over the right temporal region. During drowsiness and sleep, there is an increase in theta slowing of the background with poorly formed vertex waves seen. Photic stimulation did not elicit any abnormalities.  There were no epileptiform discharges or electrographic seizures seen.    EKG lead was unremarkable.  Impression: This awake and asleep EEG is abnormal due to occasional focal slowing over the right temporal  region.  Clinical Correlation of the above findings indicates focal cerebral dysfunction over the right temporal region suggestive of underlying structural or physiologic abnormality. The absence of  epileptiform discharges does not exclude a clinical diagnosis of epilepsy. Clinical correlation is advised.    Ellouise Newer, M.D.

## 2016-07-15 NOTE — Discharge Summary (Signed)
Physician Discharge Summary  Ashley [name] [id number] DOB: 05/20/1941 DOA: 07/14/2016  PCP: Mayra Neer, MD  Admit date: 07/14/2016 Discharge date: 07/15/2016   Recommendations for Outpatient Follow-Up:   1. Carotid doppler follow up 2. Follow up on urine culture 3. Echo as outpatient 4. May need long-term EEG if symptoms persist   Discharge Diagnosis:   Principal Problem:   Expressive aphasia Active Problems:   Memory difficulty   Cerebral amyloid angiopathy (CODE)   Essential hypertension   TIA (transient ischemic attack)   Discharge disposition:  Home  Discharge Condition: Improved.  Diet recommendation: Low sodium, heart healthy.  Carbohydrate-modified  Wound care: None.   History of Present Illness:   Ashley [name] is a 75 y.o. female with history of amyloid angiopathy, hypertension and memory issues started experiencing expressive aph[place] patient [place] her husband. This lasted for around 4-5 minutes. Patient had similar episode later in the day around 1 PM and 4 PM. Due to recurrent episodes patient decided to come to the ER. Patient did not have any weakness of the upper or lower extremity. Denies any visual symptoms or difficulty swallowing. CT of the head was unremarkable. Neurologist was consulted and patient admitted for possible TIA workup. By the time I examined the patient MRI brain was already done which did not show acute stroke but with features consistent with amyloid changes. On exam patient is nonfocal.   Hospital Course by Problem:   TIA vs amyloid related reaction -MRI/MRA: No acute pathology, MRI consistent with cerebral amyloid -EEG- Shows some focal slowing over the right temporal lobe and no seizures Per neuro: -The episodes can be "amyloid spells" vs seizures.  Would not recommend starting any anti seizure medication at this time.  If episodes continue to persist, would  recommend long term EEG monitoring which she can have setup on an outpatient basis and possibly starting an AED but could be done on a outpatient basis since she is currently back to her baseline.  UTI -culture pending -treat with ceftin x 3 days  HTN -continue tekturna     Medical Consultants:    Neuro   Discharge Exam:   Vitals:   07/15/16 1030 07/15/16 1540  BP: (!) 141/71 (!) 159/62  Pulse: 85 99  Resp: 18 18  Temp: 98.4 F (36.9 C) 98.3 F (36.8 C)   Vitals:   07/15/16 0530 07/15/16 0822 07/15/16 1030 07/15/16 1540  BP: 140/60 137/73 (!) 141/71 (!) 159/62  Pulse: 78 92 85 99  Resp: 20 20 18 18   Temp: 97.9 F (36.6 C) 98.1 F (36.7 C) 98.4 F (36.9 C) 98.3 F (36.8 C)  TempSrc: Oral Oral Oral Oral  SpO2:  99% 99% 99%  Weight:      Height:        Gen:  NAD    The results of significant diagnostics from this hospitalization (including imaging, microbiology, ancillary and laboratory) are listed below for reference.     Procedures and Diagnostic Studies:   Mr Jodene Nam Head/brain [id number] Cm  Result Date: 07/15/2016 CLINICAL DATA:  75 year old female with abnormal speech. Suspected amyloid angiopathy. Initial encounter. EXAM: MRA HEAD WITHOUT CONTRAST TECHNIQUE: Angiographic images of the Circle of Willis were obtained using MRA technique without intravenous contrast. COMPARISON:  Brain MRI 07/14/2016.  Intracranial MRA 07/10/2012. FINDINGS: Stable antegrade flow in the posterior circulation with fairly codominant distal vertebral arteries. No distal vertebral stenosis. Normal right PICA origin. Probably dominant  left AICA. Patent vertebrobasilar junction and basilar artery without stenosis. AICA and SCA origins are patent. Bilateral PCA origins are normal. Posterior communicating arteries are diminutive or absent. Stable and normal PCA branches. Stable antegrade flow in both ICA siphons, the left appears mildly dominant owing to the ACA anatomy. Normal ophthalmic artery  origins. Small left hypophyseal artery infundibulum suspected and stable. ICA siphon irregularity compatible with atherosclerosis, but no siphon stenosis. Patent carotid termini. Diminutive or absent right ACA A1 segment. The right ACA is primarily supplied from the left via the anterior communicating artery. Normal MCA and left ACA origins. Anterior communicating artery and visualized ACA branches are within normal limits. Bilateral MCA branches appear stable since 2013. There is chronic mild right mid M1 segment irregularity without significant stenosis. IMPRESSION: Stable intracranial MRA since 2013. Intracranial atherosclerosis but no significant intracranial stenosis. Electronically Signed   By: Genevie Ann M.D.   On: 07/15/2016 09:26     Labs:   Basic Metabolic Panel:  Recent Labs Lab 07/14/16 1909 07/14/16 1924 07/15/16 0444  NA 137 134* 139  K 4.1 4.0 3.9  CL 102 101 107  CO2 27  --  25  GLUCOSE 106* 99 110*  BUN 13 15 11   CREATININE 1.07* 1.00 0.95  CALCIUM 10.1  --  9.2   GFR Estimated Creatinine Clearance: 46.1 mL/min (by C-G formula based on SCr of 0.95 mg/dL). Liver Function Tests:  Recent Labs Lab 07/14/16 1909 07/15/16 0444  AST 26 22  ALT 18 16  ALKPHOS 53 45  BILITOT 0.7 0.9  PROT 6.4* 5.6*  ALBUMIN 3.8 3.3*   No results for input(s): LIPASE, AMYLASE in the last 168 hours. No results for input(s): AMMONIA in the last 168 hours. Coagulation profile  Recent Labs Lab 07/14/16 1909  INR 0.99    CBC:  Recent Labs Lab 07/14/16 1909 07/14/16 1924 07/15/16 0444  WBC 7.7  --  6.1  NEUTROABS 4.3  --   --   HGB 14.5 13.9 13.4  HCT 42.4 41.0 39.7  MCV 95.7  --  95.4  PLT 241  --  196   Cardiac Enzymes: No results for input(s): CKTOTAL, CKMB, CKMBINDEX, TROPONINI in the last 168 hours. BNP: Invalid input(s): POCBNP CBG: No results for input(s): GLUCAP in the last 168 hours. D-Dimer No results for input(s): DDIMER in the last 72 hours. Hgb A1c No  results for input(s): HGBA1C in the last 72 hours. Lipid Profile  Recent Labs  07/15/16 0444  CHOL 170  HDL 59  LDLCALC 90  TRIG 106  CHOLHDL 2.9   Thyroid function studies No results for input(s): TSH, T4TOTAL, T3FREE, THYROIDAB in the last 72 hours.  Invalid input(s): FREET3 Anemia work up No results for input(s): VITAMINB12, FOLATE, FERRITIN, TIBC, IRON, RETICCTPCT in the last 72 hours. Microbiology No results found for this or any previous visit (from the past 240 hour(s)).   Discharge Instructions:   Discharge Instructions    Diet - low sodium heart healthy    Complete by:  As directed    Discharge instructions    Complete by:  As directed    Outpatient echo May need long-term EEG monitoring-- needs to see neurology with in 2 weeks Follow culture on urine No driving until seen by neurology   Increase activity slowly    Complete by:  As directed        Medication List    STOP taking these medications   amoxicillin 500 MG capsule Commonly  known as:  AMOXIL   aspirin EC 81 MG tablet   benzonatate 100 MG capsule Commonly known as:  TESSALON   BENZONATATE PO   donepezil 5 MG tablet Commonly known as:  ARICEPT   metroNIDAZOLE 500 MG tablet Commonly known as:  FLAGYL   SULFAMETHOXAZOLE-TMP DS PO     TAKE these medications   aliskiren 150 MG tablet Commonly known as:  TEKTURNA Take 150 mg by mouth every evening.   cefUROXime 500 MG tablet Commonly known as:  CEFTIN Take 1 tablet (500 mg total) by mouth 2 (two) times daily with a meal.   celecoxib 200 MG capsule Commonly known as:  CELEBREX Take 200 mg by mouth daily.   desipramine 10 MG tablet Commonly known as:  NORPRAMIN Take 2 tablets (20 mg total) by mouth at bedtime.   estradiol 1 MG tablet Commonly known as:  ESTRACE Take 1 mg by mouth daily.   fish oil-omega-3 fatty acids 1000 MG capsule Take 2 g by mouth daily.   HYDROcodone-acetaminophen 5-325 MG tablet Commonly known as:   NORCO/VICODIN Take 1 tablet by mouth every 4 (four) hours as needed for pain. For pain   MULTIVITAMIN PO Take 1 tablet by mouth daily.   phenazopyridine 200 MG tablet Commonly known as:  PYRIDIUM Take 1 tablet (200 mg total) by mouth 3 (three) times daily as needed for pain.   pravastatin 40 MG tablet Commonly known as:  PRAVACHOL Take 40 mg by mouth daily.   promethazine 25 MG tablet Commonly known as:  PHENERGAN Take 1 tablet (25 mg total) by mouth every 6 (six) hours as needed for nausea or vomiting.   STOOL SOFTENER PO Take by mouth as needed (constipation).   valACYclovir 1000 MG tablet Commonly known as:  VALTREX Take 1,000 mg by mouth 2 (two) times daily as needed (herpes).         Time coordinating discharge: 35 min  Signed:  Kishon Garriga U [place] Chagnon   [facility] 07/15/2016, 4:33 PM

## 2016-07-15 NOTE — Progress Notes (Signed)
Patient admitted after midnight, please see H&P.  Recently in seen in ER after fall-- found to be "highly intoxicated".  EEG/carotid pending.  Appreciate neuro eval  [name] DO

## 2016-07-15 NOTE — Care Management Obs Status (Signed)
[place] NOTIFICATION   Patient Details  Name: Ashley [name] MRN: [id number] Date of Birth: 08/29/1941   Medicare Observation Status Notification Given:  Yes (MRI negative)    Pollie Friar, RN 07/15/2016, 4:39 PM

## 2016-07-15 NOTE — Progress Notes (Signed)
Pt discharge education and instructions completed with pt and spouse at bedside. Both voices understanding and denies any questions. Pt IV and telemetry removed; pt discharge home with spouse to transport her home. Pt handed her prescription for Ceftin. Pt transported off unit via wheelchair with belongings to the side. Delia Heady RN

## 2016-07-15 NOTE — Progress Notes (Signed)
Order for Ambien given.

## 2016-07-15 NOTE — Progress Notes (Signed)
Short Note:  MRI/MRA reviewed: No acute pathology, MRI consistent with cerebral amyloid  EEG- Shows some focal slowing over the right temporal lobe and no seizures  The episodes can be "amyloid spells" vs seizures  Would not recommend starting any anti seizure medication at this time  If episodes continue to persist, would recommend long term EEG monitoring which she can have setup on an outpatient basis and possibly starting an AED but could be done on a outpatient basis since she is currently back to her baseline.  Would recommend outpatient neurology follow up within 2 weeks   No anti platelet therapy due to CAA

## 2016-07-15 NOTE — Progress Notes (Signed)
Routine EEG completed, results pending. 

## 2016-07-15 NOTE — H&P (Addendum)
History and Physical    Ashley [name] [id number] DOB: July 30, 1941 DOA: 07/14/2016  PCP: Mayra Neer, MD  Patient coming from: Home.  Chief Complaint: Difficulty with speech.  HPI: Ashley [name] is a 75 y.o. female with history of amyloid angiopathy, hypertension and memory issues started experiencing expressive aphasia [place] 10 AM [place] while patient was with her husband. This lasted for around 4-5 minutes. Patient had similar episode later in the day around 1 PM and 4 PM. Due to recurrent episodes patient decided to come to the ER. Patient did not have any weakness of the upper or lower extremity. Denies any visual symptoms or difficulty swallowing. CT of the head was unremarkable. Neurologist was consulted and patient admitted for possible TIA workup. By the time I examined the patient MRI brain was already done which did not show acute stroke but with features consistent with amyloid changes. On exam patient is nonfocal.  ED Course: See history of presenting illness. MRI was negative for stroke. UA shows features concerning for UTI.  Review of Systems: As per HPI, rest all negative.   Past Medical History:  Diagnosis Date  . Arthritis    back and neck  . Carotid artery stenosis   . Cerebral amyloid angiopathy (CODE) 06/20/2016  . Chronic rhinosinusitis   . DDD (degenerative disc disease)   . Depression   . Diverticulosis of colon   . Frequency of urination   . Glaucoma   . Headache disorder 12/13/2014   Right temple  . Hearing loss   . History of kidney stones    MULTIBLE SURGICAL INTERVENTIONS  . History of skin cancer HX TOPICAL FACIAL [place] 2013  . History of TIA (transient ischemic attack)    NOV 2013-- NO RESIDUAL  . Hypertension   . IBS (irritable bowel syndrome)   . Memory difficulty 05/14/2016  . Memory loss   . Mild asthma    COLD INDUCED  . Right ureteral stone   . Seasonal and perennial allergic rhinitis   . Shingles    EYEBALL 09-17-2013  . Spondylosis     Past Surgical History:  Procedure Laterality Date  . BUNIONECTOMY     BILATERAL  . CALDWELL LUC     sinus drainage procedure  . CYSTO/ BILATERAL URETEROSCOPIC STONE EXTRACTIONS  02-09-2003  . CYSTO/ BILATERAL URETEROSCOPY/  LASER OF STONES LEFT SIDE  06-02-2007  . CYSTOSCOPY/RETROGRADE/URETEROSCOPY/STONE EXTRACTION WITH BASKET  03/31/2012   Procedure: CYSTOSCOPY/RETROGRADE/URETEROSCOPY/STONE EXTRACTION WITH BASKET;  Surgeon: Malka So, MD;  Location: [facility];  Service: Urology;  Laterality: Right;   . CYSTOSCOPY/RETROGRADE/URETEROSCOPY/STONE EXTRACTION WITH BASKET Right 03/29/2013   Procedure: CYSTOSCOPY//URETEROSCOPY/STONE EXTRACTION WITH BASKET;  Surgeon: Malka So, MD;  Location: [facility];  Service: Urology;  Laterality: Right;  . CYSTOSCOPY/RETROGRADE/URETEROSCOPY/STONE EXTRACTION WITH BASKET Right 10/14/2013   Procedure: CYSTOSCOPY/RETROGRADE/URETEROSCOPY/STONE EXTRACTION WITH BASKET;  Surgeon: Irine Seal, MD;  Location: [facility];  Service: Urology;  Laterality: Right;  . ESOPHAGOGASTRODUODENOSCOPY (EGD) WITH PROPOFOL N/A 04/12/2015   Procedure: ESOPHAGOGASTRODUODENOSCOPY (EGD) WITH PROPOFOL;  Surgeon: Arta Silence, MD;  Location: [facility];  Service: Endoscopy;  Laterality: N/A;  . EUS N/A 04/12/2015   Procedure: ESOPHAGEAL ENDOSCOPIC ULTRASOUND (EUS) RADIAL;  Surgeon: Arta Silence, MD;  Location: [facility];  Service: Endoscopy;  Laterality: N/A;  . EXTRACORPOREAL SHOCK WAVE LITHOTRIPSY  about [id number] Dr Reece Agar   Never completed procedure-could not tolerate pain. Had seizures due to pain  . EYE SURGERY  AGE 50  INJURY  . HERNIA REPAIR  AGE 43  . HOLMIUM LASER APPLICATION Right [id number]   Procedure: HOLMIUM LASER APPLICATION;  Surgeon: Irine Seal, MD;  Location: [facility];  Service: Urology;  Laterality: Right;  . LAPAROSCOPIC CHOLECYSTECTOMY  01-12-2005  . LEFT URETEROSCOPIC  STONE EXTRACTION    LAST ONE 04-28-2009   SEVERAL SURGICAL STONE EXTRACTIONS  . LUMBAR LAMINECTOMY  09-30-2002   L4 - 5;  SPONDYLOLISTHISES W/ STENOSIS AND RADICULOPATHY  . PERCUTANEOUS NEPHROSTOLITHOTOMY  1975  . RIGHT URETEROSCOPIC STONE EXTRACTION  LAST ONE 02-21-2011   MULTIPLE SURGICAL STONE EXTRACTIONS  . TONSILECTOMY, ADENOIDECTOMY, BILATERAL MYRINGOTOMY AND TUBES  CHILD  . VAGINAL HYSTERECTOMY  1991   W/ BILATERAL SALPINGOOPHECTOMY     reports that she quit smoking about 30 years ago. Her smoking use included Cigarettes. She quit after 23.00 years of use. She has never used smokeless tobacco. She reports that she drinks alcohol. She reports that she does not use drugs.  No Known Allergies  Family History  Problem Relation Age of Onset  . COPD Mother   . Breast cancer Mother   . Colon cancer Neg Hx   . Migraines Neg Hx     Prior to Admission medications   Medication Sig Start Date End Date Taking? Authorizing Provider  aliskiren (TEKTURNA) 150 MG tablet Take 150 mg by mouth every evening.    Yes Historical Provider, MD  benzonatate (TESSALON) 100 MG capsule Take 100 mg by mouth 3 (three) times daily as needed for cough.   Yes Historical Provider, MD  desipramine (NORPRAMIN) 10 MG tablet Take 2 tablets (20 mg total) by mouth at bedtime. 06/20/16  Yes Kathrynn Ducking, MD  estradiol (ESTRACE) 1 MG tablet Take 1 mg by mouth daily.   Yes Historical Provider, MD  pravastatin (PRAVACHOL) 40 MG tablet Take 40 mg by mouth daily.  04/11/16  Yes Historical Provider, MD  amoxicillin (AMOXIL) 500 MG capsule Take 500 mg by mouth as needed (for infections when traveling).     Historical Provider, MD  aspirin EC 81 MG tablet Take 162 mg by mouth 3 (three) times a week.    Historical Provider, MD  BENZONATATE PO Take by mouth as needed.    Historical Provider, MD  celecoxib (CELEBREX) 200 MG capsule Take 200 mg by mouth daily. 09/07/14   Historical Provider, MD  Docusate Calcium (STOOL  SOFTENER PO) Take by mouth as needed (constipation).     Historical Provider, MD  donepezil (ARICEPT) 5 MG tablet Take 1 tablet (5 mg total) by mouth at bedtime. Patient not taking: Reported on 07/14/2016 05/27/16   Kathrynn Ducking, MD  fish oil-omega-3 fatty acids 1000 MG capsule Take 2 g by mouth daily.     Historical Provider, MD  HYDROcodone-acetaminophen (NORCO/VICODIN) 5-325 MG per tablet Take 1 tablet by mouth every 4 (four) hours as needed for pain. For pain 03/29/13   Irine Seal, MD  metroNIDAZOLE (FLAGYL) 500 MG tablet Take 500 mg by mouth as needed (infection while traveling).  02/29/16   Historical Provider, MD  Multiple Vitamin (MULTIVITAMIN PO) Take 1 tablet by mouth daily.     Historical Provider, MD  phenazopyridine (PYRIDIUM) 200 MG tablet Take 1 tablet (200 mg total) by mouth 3 (three) times daily as needed for pain. 10/14/13   Irine Seal, MD  promethazine (PHENERGAN) 25 MG tablet Take 1 tablet (25 mg total) by mouth every 6 (six) hours as needed for nausea or vomiting. 10/14/13  Irine Seal, MD  Sulfamethoxazole-Trimethoprim (SULFAMETHOXAZOLE-TMP DS PO) Take 1 tablet by mouth as needed (infection while traveling).     Historical Provider, MD  valACYclovir (VALTREX) 1000 MG tablet Take 1,000 mg by mouth 2 (two) times daily as needed (herpes).     Historical Provider, MD    Physical Exam: Vitals:   07/14/16 2100 07/14/16 2115 07/14/16 2330 07/15/16 0130  BP: 150/99 175/80 (!) 180/71 (!) 163/74  Pulse: 83 80 89 87  Resp: 16 13 20    Temp:   98.6 F (37 C) 98.2 F (36.8 C)  TempSrc:   Oral Oral  SpO2: 100% 99% 100% 98%  Weight:   66.8 kg (147 lb 4.3 oz)   Height:   5\' 1"  (1.549 m)       Constitutional: Moderately built and nourished. Vitals:   07/14/16 2100 07/14/16 2115 07/14/16 2330 07/15/16 0130  BP: 150/99 175/80 (!) 180/71 (!) 163/74  Pulse: 83 80 89 87  Resp: 16 13 20    Temp:   98.6 F (37 C) 98.2 F (36.8 C)  TempSrc:   Oral Oral  SpO2: 100% 99% 100% 98%    Weight:   66.8 kg (147 lb 4.3 oz)   Height:   5\' 1"  (1.549 m)    Eyes: Anicteric. No pallor. ENMT: No discharge from the ears eyes nose or mouth. Neck: No mass felt. No JVD appreciated. Respiratory: No rhonchi or crepitations. Cardiovascular: S1 and S2 heard. No murmurs appreciated. Abdomen: Soft nontender bowel sounds present. No guarding or rigidity. Musculoskeletal: No edema. No joint effusion. Skin: No rash. Skin appears warm. Neurologic: Alert awake oriented to time place and person. Moves all extremities 5 x 5. No facial asymmetry. Tongue is midline. PERRLA positive. Psychiatric: Appears normal. But does have some difficulty in memory.   Labs on Admission: I have personally reviewed following labs and imaging studies  CBC:  Recent Labs Lab 07/14/16 1909 07/14/16 1924  WBC 7.7  --   NEUTROABS 4.3  --   HGB 14.5 13.9  HCT 42.4 41.0  MCV 95.7  --   PLT 241  --    Basic Metabolic Panel:  Recent Labs Lab 07/14/16 1909 07/14/16 1924  NA 137 134*  K 4.1 4.0  CL 102 101  CO2 27  --   GLUCOSE 106* 99  BUN 13 15  CREATININE 1.07* 1.00  CALCIUM 10.1  --    GFR: Estimated Creatinine Clearance: 43.8 mL/min (by C-G formula based on SCr of 1 mg/dL). Liver Function Tests:  Recent Labs Lab 07/14/16 1909  AST 26  ALT 18  ALKPHOS 53  BILITOT 0.7  PROT 6.4*  ALBUMIN 3.8   No results for input(s): LIPASE, AMYLASE in the last 168 hours. No results for input(s): AMMONIA in the last 168 hours. Coagulation Profile:  Recent Labs Lab 07/14/16 1909  INR 0.99   Cardiac Enzymes: No results for input(s): CKTOTAL, CKMB, CKMBINDEX, TROPONINI in the last 168 hours. BNP (last 3 results) No results for input(s): PROBNP in the last 8760 hours. HbA1C: No results for input(s): HGBA1C in the last 72 hours. CBG: No results for input(s): GLUCAP in the last 168 hours. Lipid Profile: No results for input(s): CHOL, HDL, LDLCALC, TRIG, CHOLHDL, LDLDIRECT in the last 72  hours. Thyroid Function Tests: No results for input(s): TSH, T4TOTAL, FREET4, T3FREE, THYROIDAB in the last 72 hours. Anemia Panel: No results for input(s): VITAMINB12, FOLATE, FERRITIN, TIBC, IRON, RETICCTPCT in the last 72 hours. Urine analysis:  Component Value Date/Time   COLORURINE YELLOW 07/14/2016 1920   APPEARANCEUR HAZY (A) 07/14/2016 1920   LABSPEC 1.016 07/14/2016 1920   PHURINE 6.0 07/14/2016 1920   GLUCOSEU NEGATIVE 07/14/2016 1920   GLUCOSEU NEGATIVE 12/12/2011 1101   HGBUR MODERATE (A) 07/14/2016 1920   BILIRUBINUR NEGATIVE 07/14/2016 1920   KETONESUR NEGATIVE 07/14/2016 1920   PROTEINUR NEGATIVE 07/14/2016 1920   UROBILINOGEN 0.2 07/10/2012 1311   NITRITE NEGATIVE 07/14/2016 1920   LEUKOCYTESUR MODERATE (A) 07/14/2016 1920   Sepsis Labs: @LABRCNTIP (procalcitonin:4,lacticidven:4) )No results found for this or any previous visit (from the past 240 hour(s)).   Radiological Exams on Admission: Ct Head Wo Contrast  Result Date: 07/14/2016 CLINICAL DATA:  Aphasia. EXAM: CT HEAD WITHOUT CONTRAST TECHNIQUE: Contiguous axial images were obtained from the base of the skull through the vertex without intravenous contrast. COMPARISON:  CT scan of June 22, 2016. FINDINGS: Brain: Minimal chronic ischemic white matter disease is noted. No mass effect or midline shift is noted. Ventricular size is within normal limits. There is no evidence of mass lesion, hemorrhage or acute infarction. Vascular: Atherosclerosis of carotid siphons is noted. Skull: Bony calvarium appears intact. Sinuses/Orbits: Paranasal sinuses are unremarkable. Other: None. IMPRESSION: Minimal chronic ischemic white matter disease. No acute intracranial abnormality seen. Electronically Signed   By: Marijo Conception, M.D.   On: 07/14/2016 20:18   Mr Brain Wo Contrast  Result Date: 07/14/2016 CLINICAL DATA:  Initial evaluation for acute expressive aphasia. History of cerebral amyloid angiopathy. EXAM: MRI HEAD  WITHOUT CONTRAST TECHNIQUE: Multiplanar, multiecho pulse sequences of the brain and surrounding structures were obtained without intravenous contrast. COMPARISON:  Comparison with prior CT from earlier the same day as well for the brain MRI from 05/25/2016. FINDINGS: Brain: Diffuse prominence of the CSF containing spaces is compatible with generalized cerebral atrophy. Patchy and confluent T2/FLAIR hyperintensity within the periventricular and deep white matter both cerebral hemispheres most compatible with chronic microvascular ischemic disease, similar to previous. No abnormal foci of restricted diffusion to suggest acute ischemia. A punctate focus of minimally increased signal intensity within the cortex of the anterior left frontal lobe favored to be artifactual in nature (series 3, image 40). Gray-white matter differentiation maintained. No areas of chronic infarction. Again seen are multiple subcentimeter foci of susceptibility artifact involving both cerebral hemispheres, predominantly peripherally located. Findings are consistent with known history of cerebral amyloid angiopathy. No mass lesion, midline shift, or mass effect. No hydrocephalus. No extra-axial fluid collection. The major dural sinuses are grossly patent. Pituitary gland normal. Vascular: Major intracranial vascular flow voids maintained. Skull and upper cervical spine: Craniocervical junction normal. Mild degenerative spondylolysis noted within the upper cervical spine without significant stenosis. Bone marrow signal intensity normal. No scalp soft tissue abnormality. Sinuses/Orbits: Globes and orbital soft tissues within normal limits. Patient is status post lens extraction bilaterally. Paranasal sinuses and mastoid air cells are clear. Inner ear structures grossly normal. IMPRESSION: 1. No acute intracranial process identified. 2. Multiple punctate foci susceptibility artifacts scattered throughout both cerebral hemispheres, consistent with  chronic small micro hemorrhages, and compatible with known history of cerebral amyloid angiopathy. 3. Moderate chronic microvascular ischemic disease. Electronically Signed   By: Jeannine Boga M.D.   On: 07/14/2016 23:01    EKG: Independently reviewed. Normal sinus rhythm.  Assessment/Plan Principal Problem:   Expressive aphasia Active Problems:   Memory difficulty   Cerebral amyloid angiopathy (CODE)   Essential hypertension   TIA (transient ischemic attack)    1. Transient expressive aphasia  probably TIA versus amyloid related - discussed with Dr. Leonel Ramsay on call neurologist. At this time carotid Doppler EEG 2-D echo and MRA brain are pending. Patient not given aspirin unless indicated by neurologist due to amyloid angiopathy. 2. Possible UTI - patient has been placed on ceftriaxone. Follow urine cultures. 3. Hypertension - continue Tekturna. Allow for permissive hypertension. 4. Memory issues being followed by neurologist.   DVT prophylaxis: SCDs. Avoiding any blood thinners due to amyloid angiopathy. Code Status: Full code.  Family Communication: Discussed with patient.  Disposition Plan: Home.  Consults called: Neurologist.  Admission status: Observation.    Rise Patience MD [facility] Pager [phone].  If 7PM-7AM, please contact night-coverage [email or web address] Password [email or web address]  07/15/2016, 1:40 AM

## 2016-07-16 LAB — VAS US CAROTID
LCCAPDIAS: 21 cm/s
LCCAPSYS: 79 cm/s
LEFT ECA DIAS: -14 cm/s
LEFT VERTEBRAL DIAS: 10 cm/s
LICAPDIAS: -66 cm/s
Left CCA dist dias: 19 cm/s
Left CCA dist sys: 66 cm/s
Left ICA dist dias: -34 cm/s
Left ICA dist sys: -110 cm/s
Left ICA prox sys: -184 cm/s
RCCAPDIAS: 14 cm/s
RIGHT ECA DIAS: -7 cm/s
RIGHT VERTEBRAL DIAS: 15 cm/s
Right CCA prox sys: 98 cm/s
Right cca dist sys: -70 cm/s

## 2016-07-16 LAB — URINE CULTURE

## 2016-07-16 LAB — HEMOGLOBIN A1C
Hgb A1c MFr Bld: 5.2 % (ref 4.8–5.6)
MEAN PLASMA GLUCOSE: 103 mg/dL

## 2016-07-23 ENCOUNTER — Ambulatory Visit
Admission: RE | Admit: 2016-07-23 | Discharge: 2016-07-23 | Disposition: A | Discharge status: Home / Self Care | Payer: Medicare Other | Source: Ambulatory Visit | Attending: Family Medicine | Admitting: Family Medicine

## 2016-07-23 DIAGNOSIS — Z1231 Encounter for screening mammogram for malignant neoplasm of breast: Secondary | ICD-10-CM | POA: Diagnosis not present

## 2016-07-24 DIAGNOSIS — R3121 Asymptomatic microscopic hematuria: Secondary | ICD-10-CM | POA: Diagnosis not present

## 2016-07-24 DIAGNOSIS — N200 Calculus of kidney: Secondary | ICD-10-CM | POA: Diagnosis not present

## 2016-07-24 DIAGNOSIS — R300 Dysuria: Secondary | ICD-10-CM | POA: Diagnosis not present

## 2016-08-12 DIAGNOSIS — L218 Other seborrheic dermatitis: Secondary | ICD-10-CM | POA: Diagnosis not present

## 2016-08-12 DIAGNOSIS — L853 Xerosis cutis: Secondary | ICD-10-CM | POA: Diagnosis not present

## 2016-08-12 DIAGNOSIS — Z85828 Personal history of other malignant neoplasm of skin: Secondary | ICD-10-CM | POA: Diagnosis not present

## 2016-08-12 DIAGNOSIS — L905 Scar conditions and fibrosis of skin: Secondary | ICD-10-CM | POA: Diagnosis not present

## 2016-08-20 ENCOUNTER — Telehealth: Payer: Self-pay | Admitting: *Deleted

## 2016-08-20 NOTE — Telephone Encounter (Signed)
Set up for January/ Feb  , please. I am booked. Make the appointment last of the day. Thank you

## 2016-08-20 NOTE — Telephone Encounter (Signed)
Okay with me to change to Dr. Brett Fairy.

## 2016-08-21 ENCOUNTER — Telehealth: Payer: Self-pay | Admitting: Internal Medicine

## 2016-08-21 NOTE — Telephone Encounter (Signed)
Ok, will forward to [place] to f/u on thanks

## 2016-08-21 NOTE — Telephone Encounter (Signed)
Noted and we have information for tomorrow's appointment with CY. Nothing more needed at this time.

## 2016-08-21 NOTE — Telephone Encounter (Signed)
Pt's husband called [phone] to schedule this appt. Dr D is requesting an appt for the last of the day. Would that be 4:00 new pt? He advised if he does not answer pls LVM with date and time. He will call back to confirm. Thank you

## 2016-08-22 ENCOUNTER — Other Ambulatory Visit (INDEPENDENT_AMBULATORY_CARE_PROVIDER_SITE_OTHER): Payer: Medicare Other

## 2016-08-22 ENCOUNTER — Ambulatory Visit (INDEPENDENT_AMBULATORY_CARE_PROVIDER_SITE_OTHER): Payer: Medicare Other | Admitting: Internal Medicine

## 2016-08-22 ENCOUNTER — Ambulatory Visit (INDEPENDENT_AMBULATORY_CARE_PROVIDER_SITE_OTHER)
Admission: RE | Admit: 2016-08-22 | Discharge: 2016-08-22 | Disposition: A | Discharge status: Home / Self Care | Payer: Medicare Other | Source: Ambulatory Visit | Attending: Internal Medicine | Admitting: Internal Medicine

## 2016-08-22 ENCOUNTER — Encounter: Payer: Self-pay | Admitting: Internal Medicine

## 2016-08-22 VITALS — BP 126/70 | HR 84 | Ht 61.0 in | Wt 139.0 lb

## 2016-08-22 DIAGNOSIS — J45901 Unspecified asthma with (acute) exacerbation: Secondary | ICD-10-CM

## 2016-08-22 DIAGNOSIS — J209 Acute bronchitis, unspecified: Secondary | ICD-10-CM

## 2016-08-22 DIAGNOSIS — R05 Cough: Secondary | ICD-10-CM | POA: Diagnosis not present

## 2016-08-22 DIAGNOSIS — R0602 Shortness of breath: Secondary | ICD-10-CM | POA: Diagnosis not present

## 2016-08-22 DIAGNOSIS — F22 Delusional disorders: Secondary | ICD-10-CM

## 2016-08-22 LAB — CBC WITH DIFFERENTIAL/PLATELET
Basophils Absolute: 0 10*3/uL (ref 0.0–0.1)
Basophils Relative: 0.5 % (ref 0.0–3.0)
EOS PCT: 2.5 % (ref 0.0–5.0)
Eosinophils Absolute: 0.2 10*3/uL (ref 0.0–0.7)
HCT: 43.9 % (ref 36.0–46.0)
Hemoglobin: 15.2 g/dL — ABNORMAL HIGH (ref 12.0–15.0)
LYMPHS ABS: 1.8 10*3/uL (ref 0.7–4.0)
Lymphocytes Relative: 23.7 % (ref 12.0–46.0)
MCHC: 34.6 g/dL (ref 30.0–36.0)
MCV: 95.3 fl (ref 78.0–100.0)
MONO ABS: 0.5 10*3/uL (ref 0.1–1.0)
Monocytes Relative: 6.5 % (ref 3.0–12.0)
NEUTROS PCT: 66.8 % (ref 43.0–77.0)
Neutro Abs: 5.1 10*3/uL (ref 1.4–7.7)
Platelets: 247 10*3/uL (ref 150.0–400.0)
RBC: 4.6 Mil/uL (ref 3.87–5.11)
RDW: 12.8 % (ref 11.5–15.5)
WBC: 7.6 10*3/uL (ref 4.0–10.5)

## 2016-08-22 LAB — BASIC METABOLIC PANEL
BUN: 15 mg/dL (ref 6–23)
CALCIUM: 9.9 mg/dL (ref 8.4–10.5)
CO2: 27 meq/L (ref 19–32)
Chloride: 105 mEq/L (ref 96–112)
Creatinine, Ser: 1.08 mg/dL (ref 0.40–1.20)
GFR: 52.77 mL/min — AB (ref >60.00)
Glucose, Bld: 89 mg/dL (ref 70–99)
Potassium: 3.9 mEq/L (ref 3.5–5.1)
SODIUM: 142 meq/L (ref 135–145)

## 2016-08-22 LAB — SEDIMENTATION RATE: SED RATE: 9 mm/h (ref 0–30)

## 2016-08-22 MED ORDER — FLUTICASONE-UMECLIDIN-VILANT 100-62.5-25 MCG/INH IN AEPB
1.0000 | INHALATION_SPRAY | Freq: Every day | RESPIRATORY_TRACT | 0 refills | Status: DC
Start: 2016-08-22 — End: 2016-10-29

## 2016-08-22 MED ORDER — BENZONATATE 200 MG PO CAPS: 200.0000 mg | ORAL_CAPSULE | Freq: Three times a day (TID) | ORAL | 1 refills | Status: DC | PRN

## 2016-08-22 NOTE — Patient Instructions (Addendum)
Order- CXR    Dx exacerbation asthmatic bronchitis  Order- lab- Total IgE, CBC w diff, BMET, sed rate  Script benzonatate perles  Sent for cough  Pay attention to the possibility that you are refluxing stomach juice at times to cause cough   Sample Trelegy inhaler 1 puff once daily and RINSE MOUTH MOUTH AFTER USE.

## 2016-08-22 NOTE — Progress Notes (Signed)
Patient ID: Ashley [name], female    DOB: 04/27/1941, 75 y.o.   MRN: [id number]  HPI  F former smoker followed for allergic rhinitis with hx asthmatic bronchitis and acute rhinosinusitis.   Allergy Profile 05/14/2012: Total IgE 280.3 without specific elevation. Allergy Skin Testing: Positive for grass pollens, tree pollens, dust mite, some molds . --------------------------------------------------------------   08/22/2016-75 year old female former smoker followed for Allergic rhinitis, asthmatic bronchitis, history acute rhinosinusitis, complicated by glaucoma, cerebral amyloid/ expressive aphasia  Her husband with her brings a note, asking we keep confidential from her :saying that she has been coughing more and getting worse. She is dealing with short-term memory issues. She adopted 2 rescue cats a few months ago and he says she is convinced that fleas from the cats have invaded her body through her nose and throat-he asks I help debunk that if she brings it up. FOLLOWS FOR: Pt states she is having a cough that has produced blood at times in phelgm. Oct 7,2017 pt fell in bathroom; 2 cats in home that had fleas-went into hair and scar from fall. Pt then started coughing and cats were treated. Pt is concerned that she has breathed in the fleas and this is causing her to cough. She relates to me her concern that fleas that into her body through the open laceration over her left forehead when she fell in October. She has been coughing without recognized reflux despite history of hiatal hernia. Tussive bilateral rib soreness she says is been present for 2 years.. She tells me she thinks sputum was discolored by Robitussin, not by blood.  Review of Systems-See HPI Constitutional:   No weight loss, night sweats, fevers, chills, fatigue, lassitude. HEENT:   No headaches,  Difficulty swallowing,  Tooth/dental problems,  +Sore throats,  CV:  No-  chest pain,  no-Orthopnea, PND, swelling in lower  extremities, anasarca, dizziness, palpitations GI  No heartburn, indigestion, abdominal pain, nausea, vomiting,  Resp: No shortness of breath with exertion or at rest.  No excess mucus, no- productive cough,  No non-productive cough,  No coughing up of blood.  No- change in color of mucus.  No- wheezing.  Skin: no rash or lesions. GU:  MS:  No joint pain or swelling.   Psych:  No change in mood or affect. No depression or anxiety.  No memory loss.     Objective:   Physical Exam General- Alert, Oriented, Affect-appropriate, Distress- none acute Skin- rash-none, lesions- none, excoriation- none Lymphadenopathy- none Head- atraumatic            Eyes- Gross vision intact, PERRLA, conjunctivae clear secretions            Ears- Hearing, canals-normal            Nose- Clear, no-Septal dev, mucus, polyps, erosion, perforation             Throat- Mallampati II , mucosa clear , drainage- none, tonsils- atrophic Neck- flexible , trachea midline, no stridor , thyroid nl, carotid no bruit Chest - symmetrical excursion , unlabored           Heart/CV- RRR , no murmur , no gallop  , no rub, nl s1 s2                           - JVD- none , edema- none, stasis changes- none, varices- none           Lung- clear, unlabored,  dullness-none, rub- none, Cough + light           Chest wall-  Abd-  Br/ Gen/ Rectal- Not done, not indicated Extrem- cyanosis- none, clubbing, none, atrophy- none, strength- nl Neuro- grossly intact to observation

## 2016-08-23 DIAGNOSIS — F22 Delusional disorders: Secondary | ICD-10-CM | POA: Insufficient documentation

## 2016-08-23 LAB — IGE: IgE (Immunoglobulin E), Serum: 154 kU/L — ABNORMAL HIGH (ref <115)

## 2016-08-23 NOTE — Assessment & Plan Note (Signed)
Increased cough in former smoker. We don't think she was describing hemoptysis, but sputum stained by Robitussin. Plan-CXR, consider CT chest if needed, sample Trelegy inhaler, tessalon perles

## 2016-08-23 NOTE — Assessment & Plan Note (Signed)
She is maintaining delusional thought process that her body might of been invaded by fleas through her laceration or airway when she had a syncopal fall in early October. We can check some simple labs that this is presumably part of a dementia process and she will need referral from primary care to neurology in the future.

## 2016-08-26 DIAGNOSIS — F0390 Unspecified dementia without behavioral disturbance: Secondary | ICD-10-CM | POA: Diagnosis not present

## 2016-08-26 DIAGNOSIS — M545 Low back pain: Secondary | ICD-10-CM | POA: Diagnosis not present

## 2016-08-26 DIAGNOSIS — R1030 Lower abdominal pain, unspecified: Secondary | ICD-10-CM | POA: Diagnosis not present

## 2016-08-26 NOTE — Telephone Encounter (Signed)
I spoke to husband and was able to get her in to see Dr. Brett Fairy in January. I cancelled appt with Dr. Jannifer Franklin.

## 2016-09-06 DIAGNOSIS — F0281 Dementia in other diseases classified elsewhere with behavioral disturbance: Secondary | ICD-10-CM | POA: Diagnosis not present

## 2016-09-12 DIAGNOSIS — N3000 Acute cystitis without hematuria: Secondary | ICD-10-CM | POA: Diagnosis not present

## 2016-09-12 DIAGNOSIS — N200 Calculus of kidney: Secondary | ICD-10-CM | POA: Diagnosis not present

## 2016-09-18 DIAGNOSIS — R109 Unspecified abdominal pain: Secondary | ICD-10-CM | POA: Diagnosis not present

## 2016-09-18 DIAGNOSIS — N200 Calculus of kidney: Secondary | ICD-10-CM | POA: Diagnosis not present

## 2016-09-26 ENCOUNTER — Emergency Department (HOSPITAL_COMMUNITY): Payer: Medicare Other

## 2016-09-26 ENCOUNTER — Encounter (HOSPITAL_COMMUNITY): Payer: Self-pay | Admitting: Emergency Medicine

## 2016-09-26 ENCOUNTER — Emergency Department (HOSPITAL_COMMUNITY)
Admission: EM | Admit: 2016-09-26 | Discharge: 2016-09-27 | Disposition: A | Discharge status: Home / Self Care | Payer: Medicare Other | Attending: Emergency Medicine | Admitting: Emergency Medicine

## 2016-09-26 DIAGNOSIS — W19XXXA Unspecified fall, initial encounter: Secondary | ICD-10-CM | POA: Insufficient documentation

## 2016-09-26 DIAGNOSIS — Z8489 Family history of other specified conditions: Secondary | ICD-10-CM | POA: Diagnosis not present

## 2016-09-26 DIAGNOSIS — Z79899 Other long term (current) drug therapy: Secondary | ICD-10-CM | POA: Insufficient documentation

## 2016-09-26 DIAGNOSIS — I10 Essential (primary) hypertension: Secondary | ICD-10-CM | POA: Insufficient documentation

## 2016-09-26 DIAGNOSIS — Z043 Encounter for examination and observation following other accident: Secondary | ICD-10-CM | POA: Insufficient documentation

## 2016-09-26 DIAGNOSIS — F068 Other specified mental disorders due to known physiological condition: Secondary | ICD-10-CM | POA: Diagnosis not present

## 2016-09-26 DIAGNOSIS — Z87891 Personal history of nicotine dependence: Secondary | ICD-10-CM | POA: Diagnosis not present

## 2016-09-26 DIAGNOSIS — S0990XA Unspecified injury of head, initial encounter: Secondary | ICD-10-CM | POA: Diagnosis not present

## 2016-09-26 DIAGNOSIS — Z85828 Personal history of other malignant neoplasm of skin: Secondary | ICD-10-CM | POA: Diagnosis not present

## 2016-09-26 DIAGNOSIS — Z803 Family history of malignant neoplasm of breast: Secondary | ICD-10-CM | POA: Diagnosis not present

## 2016-09-26 DIAGNOSIS — S199XXA Unspecified injury of neck, initial encounter: Secondary | ICD-10-CM | POA: Diagnosis not present

## 2016-09-26 DIAGNOSIS — Y939 Activity, unspecified: Secondary | ICD-10-CM | POA: Diagnosis not present

## 2016-09-26 DIAGNOSIS — J45909 Unspecified asthma, uncomplicated: Secondary | ICD-10-CM | POA: Insufficient documentation

## 2016-09-26 DIAGNOSIS — R930 Abnormal findings on diagnostic imaging of skull and head, not elsewhere classified: Secondary | ICD-10-CM | POA: Insufficient documentation

## 2016-09-26 DIAGNOSIS — Y92009 Unspecified place in unspecified non-institutional (private) residence as the place of occurrence of the external cause: Secondary | ICD-10-CM | POA: Insufficient documentation

## 2016-09-26 DIAGNOSIS — F29 Unspecified psychosis not due to a substance or known physiological condition: Secondary | ICD-10-CM | POA: Diagnosis not present

## 2016-09-26 DIAGNOSIS — Z9889 Other specified postprocedural states: Secondary | ICD-10-CM | POA: Diagnosis not present

## 2016-09-26 DIAGNOSIS — Z8673 Personal history of transient ischemic attack (TIA), and cerebral infarction without residual deficits: Secondary | ICD-10-CM | POA: Insufficient documentation

## 2016-09-26 DIAGNOSIS — Y999 Unspecified external cause status: Secondary | ICD-10-CM | POA: Diagnosis not present

## 2016-09-26 DIAGNOSIS — T1490XA Injury, unspecified, initial encounter: Secondary | ICD-10-CM | POA: Diagnosis not present

## 2016-09-26 DIAGNOSIS — Z008 Encounter for other general examination: Secondary | ICD-10-CM | POA: Diagnosis not present

## 2016-09-26 DIAGNOSIS — T148XXA Other injury of unspecified body region, initial encounter: Secondary | ICD-10-CM | POA: Diagnosis not present

## 2016-09-26 DIAGNOSIS — R410 Disorientation, unspecified: Secondary | ICD-10-CM | POA: Diagnosis not present

## 2016-09-26 DIAGNOSIS — R55 Syncope and collapse: Secondary | ICD-10-CM | POA: Diagnosis not present

## 2016-09-26 LAB — CBC
HEMATOCRIT: 40.3 % (ref 36.0–46.0)
HEMOGLOBIN: 13.8 g/dL (ref 12.0–15.0)
MCH: 32.2 pg (ref 26.0–34.0)
MCHC: 34.2 g/dL (ref 30.0–36.0)
MCV: 93.9 fL (ref 78.0–100.0)
Platelets: 143 10*3/uL — ABNORMAL LOW (ref 150–400)
RBC: 4.29 MIL/uL (ref 3.87–5.11)
RDW: 12.5 % (ref 11.5–15.5)
WBC: 6.8 10*3/uL (ref 4.0–10.5)

## 2016-09-26 NOTE — ED Notes (Signed)
ED Provider at bedside. 

## 2016-09-26 NOTE — ED Provider Notes (Signed)
[facility] DEPT Provider Note   CSN: [id number] Arrival date & time: 09/26/16  2140 By signing my name below, I, Dyke Brackett, attest that this documentation has been prepared under the direction and in the presence of non-physician practitioner, Shary Decamp, PA-C. Electronically Signed: Dyke Brackett, Scribe. 09/26/2016. 10:29 PM.   History   Chief Complaint Chief Complaint  Patient presents with  . Fall   LEVEL 5 CAVEAT DUE TO MEMORY DISORDER  HPI Ashley [name] is a 77 y.o. female with a hx of kidney stones, right uretal stone, TIA, memory loss, delusions of  parasitosis, and expressive aphasia who presents to the Emergency Department for evaluation s/p unwitnessed fall this evening.  After she took her medication this evening, he put her to bed; one hour later, he heard a thud and found pt on the floor with a TV on top of her. Per husband, she responded right away, but has been "babbling" since her fall. He is unsure of LOC or head injury. When asked why pt is here, she states "we're going to have a second baby". She states "it seemed as if a lot pf people were right in front of me." Pt's husband states she has an appointment at 10 am tomorrow for evaluation by her psychiatrist to be placed in a memory care unit. Followed by Neurology as well. Pt denies any pain.   The history is provided by the spouse. No language interpreter was used.   Past Medical History:  Diagnosis Date  . Arthritis    back and neck  . Carotid artery stenosis   . Cerebral amyloid angiopathy (CODE) 06/20/2016  . Chronic rhinosinusitis   . DDD (degenerative disc disease)   . Depression   . Diverticulosis of colon   . Frequency of urination   . Glaucoma   . Headache disorder 12/13/2014   Right temple  . Hearing loss   . History of kidney stones    MULTIBLE SURGICAL INTERVENTIONS  . History of skin cancer HX TOPICAL FACIAL [place] 2013  . History of TIA (transient ischemic attack)    NOV 2013-- NO  RESIDUAL  . Hypertension   . IBS (irritable bowel syndrome)   . Memory difficulty 05/14/2016  . Memory loss   . Mild asthma    COLD INDUCED  . Right ureteral stone   . Seasonal and perennial allergic rhinitis   . Shingles    EYEBALL 09-17-2013  . Spondylosis     Patient Active Problem List   Diagnosis Date Noted  . 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 Essential hypertension 07/15/2016  . TIA (transient ischemic attack)   . Expressive aphasia 07/14/2016  . Cerebral amyloid angiopathy (CODE) 06/20/2016  . Memory difficulty 05/14/2016  . Headache disorder 12/13/2014  . Chest wall pain 05/24/2012  . Other general symptoms  12/12/2011  . Iron deficiency anemia, unspecified  12/12/2011  . Diverticulosis of colon (without mention of hemorrhage) 12/06/2011  . History of IBS 12/06/2011  . Calcium oxalate kidney stones 12/06/2011  . S/P cholecystectomy 12/06/2011  . Bilateral lower abdominal pain 12/06/2011  . Right ureteral stone 07/22/2011  . Hyperparathyroidism, primary ([place]) 04/22/2011  . RHINOSINUSITIS, ACUTE 09/04/2007  . ASTHMATIC BRONCHITIS, ACUTE 09/03/2007  . Seasonal and perennial allergic rhinitis 09/03/2007    Past Surgical History:  Procedure Laterality Date  . BUNIONECTOMY     BILATERAL  . CALDWELL LUC     sinus drainage procedure  . CYSTO/ BILATERAL URETEROSCOPIC STONE EXTRACTIONS  02-09-2003  .  CYSTO/ BILATERAL URETEROSCOPY/  LASER OF STONES LEFT SIDE  06-02-2007  . CYSTOSCOPY/RETROGRADE/URETEROSCOPY/STONE EXTRACTION WITH BASKET  03/31/2012   Procedure: CYSTOSCOPY/RETROGRADE/URETEROSCOPY/STONE EXTRACTION WITH BASKET;  Surgeon: Malka So, MD;  Location[facility];  Service: Urology;  Laterality: Right;   . CYSTOSCOPY/RETROGRADE/URETEROSCOPY/STONE EXTRACTION WITH BASKET Right 03/29/2013   Procedure: CYSTOSCOPY//URETEROSCOPY/STONE EXTRACTION WITH BASKET;  Surgeon: Malka So, MD;  Location: [facility];  Service: Urology;  Laterality: Right;    . CYSTOSCOPY/RETROGRADE/URETEROSCOPY/STONE EXTRACTION WITH BASKET Right 10/14/2013   Procedure: CYSTOSCOPY/RETROGRADE/URETEROSCOPY/STONE EXTRACTION WITH BASKET;  Surgeon: Irine Seal, MD;  Location: [facility];  Service: Urology;  Laterality: Right;  . ESOPHAGOGASTRODUODENOSCOPY (EGD) WITH PROPOFOL N/A 04/12/2015   Procedure: ESOPHAGOGASTRODUODENOSCOPY (EGD) WITH PROPOFOL;  Surgeon: Arta Silence, MD;  Location: [facility];  Service: Endoscopy;  Laterality: N/A;  . EUS N/A 04/12/2015   Procedure: ESOPHAGEAL ENDOSCOPIC ULTRASOUND (EUS) RADIAL;  Surgeon: Arta Silence, MD;  Location: [facility];  Service: Endoscopy;  Laterality: N/A;  . EXTRACORPOREAL SHOCK WAVE LITHOTRIPSY  about [id number] Dr Reece Agar   Never completed procedure-could not tolerate pain. Had seizures due to pain  . EYE SURGERY  AGE 15   INJURY  . HERNIA REPAIR  AGE 83  . HOLMIUM LASER APPLICATION Right [id number]   Procedure: HOLMIUM LASER APPLICATION;  Surgeon: Irine Seal, MD;  [facility];  Service: Urology;  Laterality: Right;  . LAPAROSCOPIC CHOLECYSTECTOMY  01-12-2005  . LEFT URETEROSCOPIC STONE EXTRACTION    LAST ONE 04-28-2009   SEVERAL SURGICAL STONE EXTRACTIONS  . LUMBAR LAMINECTOMY  09-30-2002   L4 - 5;  SPONDYLOLISTHISES W/ STENOSIS AND RADICULOPATHY  . PERCUTANEOUS NEPHROSTOLITHOTOMY  1975  . RIGHT URETEROSCOPIC STONE EXTRACTION  LAST ONE 02-21-2011   MULTIPLE SURGICAL STONE EXTRACTIONS  . TONSILECTOMY, ADENOIDECTOMY, BILATERAL MYRINGOTOMY AND TUBES  CHILD  . VAGINAL HYSTERECTOMY  1991   W/ BILATERAL SALPINGOOPHECTOMY    OB History    No data available     Home Medications    Prior to Admission medications   Medication Sig Start Date End Date Taking? Authorizing Provider  benzonatate (TESSALON) 200 MG capsule Take 1 capsule (200 mg total) by mouth 3 (three) times daily as needed for cough. 08/22/16   Deneise Lever, MD  desipramine (NOPRAMIN) 10 MG tablet Take 10 mg  by mouth 2 (two) times daily. 09/03/16   Historical Provider, MD  divalproex (DEPAKOTE ER) 250 MG 24 hr tablet Take 250 mg by mouth 2 (two) times daily. 09/06/16   Historical Provider, MD  Docusate Calcium (STOOL SOFTENER PO) Take by mouth as needed (constipation).     Historical Provider, MD  estradiol (ESTRACE) 1 MG tablet Take 1 mg by mouth daily.    Historical Provider, MD  fish oil-omega-3 fatty acids 1000 MG capsule Take 2 g by mouth daily.     Historical Provider, MD  Fluticasone-Umeclidin-Vilant (TRELEGY ELLIPTA) 100-62.5-25 MCG/INH AEPB Inhale 1 puff into the lungs daily. RINSE MOUTH AFTER USE 08/22/16   Deneise Lever, MD  LORazepam (ATIVAN) 1 MG tablet Take 1 mg by mouth daily. 09/25/16   Historical Provider, MD  Multiple Vitamin (MULTIVITAMIN PO) Take 1 tablet by mouth daily.     Historical Provider, MD  pravastatin (PRAVACHOL) 40 MG tablet Take 40 mg by mouth daily.  04/11/16   Historical Provider, MD  risperiDONE (RISPERDAL) 0.5 MG tablet Take 0.5 mg by mouth 2 (two) times daily. 09/25/16   Historical Provider, MD  TEKTURNA 300 MG tablet Take 300 mg  by mouth daily. 07/18/16   Historical Provider, MD  traMADol (ULTRAM) 50 MG tablet Take 50 mg by mouth 4 (four) times daily as needed for pain. 09/20/16   Historical Provider, MD  valACYclovir (VALTREX) 1000 MG tablet Take 1 g by mouth 3 (three) times daily. 08/27/16   Historical Provider, MD    Family History Family History  Problem Relation Age of Onset  . COPD Mother   . Breast cancer Mother   . Colon cancer Neg Hx   . Migraines Neg Hx     Social History Social History  Substance Use Topics  . Smoking status: Former Smoker    Years: 23.00    Types: Cigarettes    Quit date: 09/16/1985  . Smokeless tobacco: Never Used  . Alcohol use Yes     Comment: social - weekend     Allergies   Patient has no known allergies.   Review of Systems Review of Systems  Reason unable to perform ROS: memory disorder.   Physical  Exam Updated Vital Signs BP (!) 165/108 (BP Location: Left Wrist)   Pulse 109   Temp 98.2 F (36.8 C) (Oral)   Resp 17   SpO2 100%   Physical Exam  Constitutional: Vital signs are normal. She appears well-developed and well-nourished. No distress.  HENT:  Head: Normocephalic and atraumatic. Head is without raccoon's eyes, without Battle's sign, without contusion and without laceration.  Right Ear: Hearing normal.  Left Ear: Hearing normal.  Eyes: Conjunctivae and EOM are normal. Pupils are equal, round, and reactive to light.  Neck: Normal range of motion. Neck supple.  Cardiovascular: Regular rhythm, normal heart sounds and intact distal pulses.  Tachycardia present.   No murmur heard. Pulmonary/Chest: Effort normal and breath sounds normal. No respiratory distress. She has no wheezes. She has no rales.  Abdominal: She exhibits no distension. There is no tenderness.  Musculoskeletal: Normal range of motion.  Neurological: She is alert. She has normal strength. No cranial nerve deficit or sensory deficit.  Cranial Nerves:  II: Pupils equal, round, reactive to light III,IV, VI: ptosis not present, extra-ocular motions intact bilaterally  V,VII: smile symmetric, facial light touch sensation equal VIII: hearing grossly normal bilaterally  IX,X: midline uvula rise  XI: bilateral shoulder shrug equal and strong XII: midline tongue extension  Skin: Skin is warm and dry.  Psychiatric: She has a normal mood and affect. Her speech is normal and behavior is normal. Thought content normal.  Nursing note and vitals reviewed.  ED Treatments / Results  DIAGNOSTIC STUDIES:  Oxygen Saturation is 100% on RA, normal by my interpretation.    COORDINATION OF CARE:  10:24 PM Discussed treatment plan with pt at bedside and pt agreed to plan.   Labs (all labs ordered are listed, but only abnormal results are displayed) Labs Reviewed  CBC - Abnormal; Notable for the following:       Result  Value   Platelets 143 (*)    All other components within normal limits  BASIC METABOLIC PANEL - Abnormal; Notable for the following:    Glucose, Bld 108 (*)    Creatinine, Ser 1.12 (*)    GFR calc non Af Amer 48 (*)    GFR calc Af Amer 55 (*)    All other components within normal limits  URINALYSIS, ROUTINE W REFLEX MICROSCOPIC - Abnormal; Notable for the following:    APPearance HAZY (*)    Hgb urine dipstick MODERATE (*)    Ketones, ur  20 (*)    Leukocytes, UA SMALL (*)    Bacteria, UA FEW (*)    Squamous Epithelial / LPF 6-30 (*)    All other components within normal limits    EKG  EKG Interpretation None      Radiology Dg Chest 2 View  Result Date: 09/27/2016 CLINICAL DATA:  Status post unwitnessed fall. Confusion. Initial encounter. EXAM: CHEST  2 VIEW COMPARISON:  Chest radiograph performed 08/22/2016 FINDINGS: The lungs are well-aerated and clear. There is no evidence of focal opacification, pleural effusion or pneumothorax. The heart is normal in size; the mediastinal contour is within normal limits. No acute osseous abnormalities are seen. Clips are noted within the right upper quadrant, reflecting prior cholecystectomy. IMPRESSION: No acute cardiopulmonary process seen. No displaced rib fractures identified. Electronically Signed   By: Garald Balding M.D.   On: 09/27/2016 00:12   Ct Head Wo Contrast  Result Date: 09/26/2016 CLINICAL DATA:  Unwitnessed fall this evening. EXAM: CT HEAD WITHOUT CONTRAST CT CERVICAL SPINE WITHOUT CONTRAST TECHNIQUE: Multidetector CT imaging of the head and cervical spine was performed following the standard protocol without intravenous contrast. Multiplanar CT image reconstructions of the cervical spine were also generated. COMPARISON:  None. MRI brain 07/14/2016. CT head 07/14/2016. CT head and cervical spine 06/22/2016. FINDINGS: CT HEAD FINDINGS Brain: Mild cerebral atrophy. Mild ventricular dilatation consistent with central atrophy.  Low-attenuation changes in the deep white matter consistent with small vessel ischemia. No abnormal extra-axial fluid collections. No mass effect or midline shift. Gray-white matter junctions are distinct. Basal cisterns are not effaced. No acute intracranial hemorrhage. Vascular: Vascular calcifications are present. Skull: Normal. Negative for fracture or focal lesion. Sinuses/Orbits: No acute finding. Other: None. CT CERVICAL SPINE FINDINGS Alignment: Reversal of the usual cervical lordosis is unchanged since prior study and likely degenerative change. Slight anterior subluxation at C3-4 and C7-T1 is also unchanged. Normal alignment of the facet joints. C1-2 articulation appears intact. Skull base and vertebrae: Degenerative cysts in the odontoid process. No acute fracture. No primary bone lesion or focal pathologic process. Soft tissues and spinal canal: No prevertebral fluid or swelling. No visible canal hematoma. Disc levels: Diffuse degenerative change throughout the cervical spine with narrowed interspaces and associated endplate hypertrophic changes. Degenerative changes are most prominent at C5-6 and C6-7 levels. Degenerative changes throughout the facet joints. Upper chest: Aortic atherosclerosis. Visualized lung apices are clear. Other: Vascular calcifications in the cervical carotid and vertebral arteries. IMPRESSION: No acute intracranial abnormalities. Mild chronic atrophy and small vessel ischemic changes. Alignment of the cervical spine is unchanged since prior study. Diffuse degenerative changes in the cervical spine. No acute displaced fractures are identified. Electronically Signed   By: Lucienne Capers M.D.   On: 09/26/2016 23:08   Ct Cervical Spine Wo Contrast  Result Date: 09/26/2016 CLINICAL DATA:  Unwitnessed fall this evening. EXAM: CT HEAD WITHOUT CONTRAST CT CERVICAL SPINE WITHOUT CONTRAST TECHNIQUE: Multidetector CT imaging of the head and cervical spine was performed following the  standard protocol without intravenous contrast. Multiplanar CT image reconstructions of the cervical spine were also generated. COMPARISON:  None. MRI brain 07/14/2016. CT head 07/14/2016. CT head and cervical spine 06/22/2016. FINDINGS: CT HEAD FINDINGS Brain: Mild cerebral atrophy. Mild ventricular dilatation consistent with central atrophy. Low-attenuation changes in the deep white matter consistent with small vessel ischemia. No abnormal extra-axial fluid collections. No mass effect or midline shift. Gray-white matter junctions are distinct. Basal cisterns are not effaced. No acute intracranial hemorrhage. Vascular: Vascular calcifications  are present. Skull: Normal. Negative for fracture or focal lesion. Sinuses/Orbits: No acute finding. Other: None. CT CERVICAL SPINE FINDINGS Alignment: Reversal of the usual cervical lordosis is unchanged since prior study and likely degenerative change. Slight anterior subluxation at C3-4 and C7-T1 is also unchanged. Normal alignment of the facet joints. C1-2 articulation appears intact. Skull base and vertebrae: Degenerative cysts in the odontoid process. No acute fracture. No primary bone lesion or focal pathologic process. Soft tissues and spinal canal: No prevertebral fluid or swelling. No visible canal hematoma. Disc levels: Diffuse degenerative change throughout the cervical spine with narrowed interspaces and associated endplate hypertrophic changes. Degenerative changes are most prominent at C5-6 and C6-7 levels. Degenerative changes throughout the facet joints. Upper chest: Aortic atherosclerosis. Visualized lung apices are clear. Other: Vascular calcifications in the cervical carotid and vertebral arteries. IMPRESSION: No acute intracranial abnormalities. Mild chronic atrophy and small vessel ischemic changes. Alignment of the cervical spine is unchanged since prior study. Diffuse degenerative changes in the cervical spine. No acute displaced fractures are  identified. Electronically Signed   By: Lucienne Capers M.D.   On: 09/26/2016 23:08    Procedures Procedures (including critical care time)  Medications Ordered in ED Medications - No data to display   Initial Impression / Assessment and Plan / ED Course  I have reviewed the triage vital signs and the nursing notes.  Pertinent labs & imaging results that were available during my care of the patient were reviewed by me and considered in my medical decision making (see chart for details).  Clinical Course    Final Clinical Impressions(s) / ED Diagnoses  {I have reviewed and evaluated the relevant laboratory values. {I have reviewed and evaluated the relevant imaging studies.  {I have reviewed the relevant previous healthcare records. {I have reviewed EMS Documentation. {I obtained HPI from historian. {Patient discussed with supervising physician.  ED Course:  Assessment: Pt is a 75yF with hx TIA, memory loss, delusions of  parasitosis, and expressive aphasia who presents due to suspect mechanical fall today. No pain currently. No CP/SOB/ABD pain. On exam, pt in NAD. Nontoxic/nonseptic appearing. VSS. Afebrile. Lungs CTA. Heart RRR. Abdomen nontender soft. Neuro exam unremarkable. Baseline dementia. CBC/BMP unremarkable. UA unremarkable. CXR unremarkable. CT Head/Neck unremarkable. Plan is to DC home with follow up to PCP with tomorrow appointment. At time of discharge, Patient is in no acute distress. Vital Signs are stable. Patient is able to ambulate. Patient able to tolerate PO.   Place face to face with case management for home health needs  Disposition/Plan:  DC Home Additional Verbal discharge instructions given and discussed with patient.  Pt Instructed to f/u with PCP in the next week for evaluation and treatment of symptoms. Return precautions given Pt acknowledges and agrees with plan  Supervising Physician Leo Grosser, MD   Final diagnoses:  Fall, initial encounter     New Prescriptions New Prescriptions   No medications on file   I personally performed the services described in this documentation, which was scribed in my presence. The recorded information has been reviewed and is accurate.      Shary Decamp, PA-C 09/27/16 0119    Shary Decamp, PA-C 09/27/16 [id number]    Leo Grosser, MD 09/27/16 [phone]

## 2016-09-26 NOTE — ED Triage Notes (Signed)
Per EMS, patient from home, husband reports he put patient to bed and later heard her falling. Patient unable to state if she hit her head or had LOC. Patient A&O to baseline. Hx dementia and psychosis. Denies any pain.

## 2016-09-26 NOTE — ED Notes (Signed)
Bed: [id number] Expected date:  Expected time:  Means of arrival:  Comments: 76 yr old unwitnessed fall, possible LOC

## 2016-09-26 NOTE — ED Notes (Signed)
Patient transported to CT 

## 2016-09-27 ENCOUNTER — Encounter (HOSPITAL_COMMUNITY): Payer: Self-pay | Admitting: Emergency Medicine

## 2016-09-27 ENCOUNTER — Emergency Department (EMERGENCY_DEPARTMENT_HOSPITAL)
Admission: EM | Admit: 2016-09-27 | Discharge: 2016-09-29 | Disposition: A | Discharge status: Other Acute Inpatient Hospital | Payer: Medicare Other | Source: Home / Self Care | Attending: Emergency Medicine | Admitting: Emergency Medicine

## 2016-09-27 DIAGNOSIS — N390 Urinary tract infection, site not specified: Secondary | ICD-10-CM | POA: Diagnosis present

## 2016-09-27 DIAGNOSIS — Z8673 Personal history of transient ischemic attack (TIA), and cerebral infarction without residual deficits: Secondary | ICD-10-CM

## 2016-09-27 DIAGNOSIS — R55 Syncope and collapse: Secondary | ICD-10-CM | POA: Diagnosis not present

## 2016-09-27 DIAGNOSIS — F918 Other conduct disorders: Secondary | ICD-10-CM

## 2016-09-27 DIAGNOSIS — F29 Unspecified psychosis not due to a substance or known physiological condition: Secondary | ICD-10-CM

## 2016-09-27 DIAGNOSIS — Z043 Encounter for examination and observation following other accident: Secondary | ICD-10-CM | POA: Diagnosis not present

## 2016-09-27 DIAGNOSIS — F068 Other specified mental disorders due to known physiological condition: Secondary | ICD-10-CM | POA: Diagnosis present

## 2016-09-27 DIAGNOSIS — R410 Disorientation, unspecified: Secondary | ICD-10-CM | POA: Diagnosis not present

## 2016-09-27 DIAGNOSIS — Z79899 Other long term (current) drug therapy: Secondary | ICD-10-CM | POA: Insufficient documentation

## 2016-09-27 DIAGNOSIS — I10 Essential (primary) hypertension: Secondary | ICD-10-CM | POA: Insufficient documentation

## 2016-09-27 DIAGNOSIS — Z85828 Personal history of other malignant neoplasm of skin: Secondary | ICD-10-CM | POA: Insufficient documentation

## 2016-09-27 DIAGNOSIS — Z87891 Personal history of nicotine dependence: Secondary | ICD-10-CM | POA: Insufficient documentation

## 2016-09-27 DIAGNOSIS — Z008 Encounter for other general examination: Secondary | ICD-10-CM | POA: Diagnosis not present

## 2016-09-27 DIAGNOSIS — J45909 Unspecified asthma, uncomplicated: Secondary | ICD-10-CM

## 2016-09-27 LAB — URINALYSIS, ROUTINE W REFLEX MICROSCOPIC
Bilirubin Urine: NEGATIVE
Glucose, UA: NEGATIVE mg/dL
KETONES UR: 20 mg/dL — AB
Nitrite: NEGATIVE
PH: 6 (ref 5.0–8.0)
Protein, ur: NEGATIVE mg/dL
SPECIFIC GRAVITY, URINE: 1.012 (ref 1.005–1.030)

## 2016-09-27 LAB — BASIC METABOLIC PANEL
Anion gap: 6 (ref 5–15)
BUN: 17 mg/dL (ref 6–20)
CALCIUM: 9 mg/dL (ref 8.9–10.3)
CO2: 26 mmol/L (ref 22–32)
Chloride: 108 mmol/L (ref 101–111)
Creatinine, Ser: 1.12 mg/dL — ABNORMAL HIGH (ref 0.44–1.00)
GFR calc Af Amer: 55 mL/min — ABNORMAL LOW (ref >60)
GFR calc non Af Amer: 48 mL/min — ABNORMAL LOW (ref >60)
GLUCOSE: 108 mg/dL — AB (ref 65–99)
Potassium: 4.2 mmol/L (ref 3.5–5.1)
Sodium: 140 mmol/L (ref 135–145)

## 2016-09-27 MED ORDER — PRAVASTATIN SODIUM 40 MG PO TABS
40.0000 mg | ORAL_TABLET | Freq: Every evening | ORAL | Status: DC
  Administered 2016-09-27 – 2016-09-29 (×3): 40 mg via ORAL
  Filled 2016-09-27 (×3): qty 1

## 2016-09-27 MED ORDER — RISPERIDONE 0.5 MG PO TABS
0.5000 mg | ORAL_TABLET | Freq: Two times a day (BID) | ORAL | Status: DC
  Administered 2016-09-27 – 2016-09-29 (×5): 0.5 mg via ORAL
  Filled 2016-09-27 (×5): qty 1

## 2016-09-27 MED ORDER — DOCUSATE SODIUM 100 MG PO CAPS: 100.0000 mg | ORAL_CAPSULE | Freq: Every day | ORAL | Status: DC | PRN

## 2016-09-27 MED ORDER — LORAZEPAM 2 MG/ML IJ SOLN: 1.0000 mg | Freq: Once | INTRAMUSCULAR | Status: AC

## 2016-09-27 MED ORDER — ALISKIREN FUMARATE 150 MG PO TABS
150.0000 mg | ORAL_TABLET | Freq: Every day | ORAL | Status: DC
  Administered 2016-09-27 – 2016-09-29 (×3): 150 mg via ORAL
  Filled 2016-09-27 (×3): qty 1

## 2016-09-27 MED ORDER — LORAZEPAM 1 MG PO TABS
1.0000 mg | ORAL_TABLET | Freq: Once | ORAL | Status: AC
  Administered 2016-09-27: 1 mg via ORAL
  Filled 2016-09-27: qty 1

## 2016-09-27 MED ORDER — LORAZEPAM 2 MG/ML IJ SOLN
1.0000 mg | Freq: Once | INTRAMUSCULAR | Status: DC
  Filled 2016-09-27: qty 1

## 2016-09-27 MED ORDER — CEPHALEXIN 500 MG PO CAPS
500.0000 mg | ORAL_CAPSULE | Freq: Three times a day (TID) | ORAL | Status: DC
  Administered 2016-09-27 – 2016-09-29 (×7): 500 mg via ORAL
  Filled 2016-09-27 (×7): qty 1

## 2016-09-27 MED ORDER — DIVALPROEX SODIUM ER 250 MG PO TB24
250.0000 mg | ORAL_TABLET | Freq: Two times a day (BID) | ORAL | Status: DC
  Administered 2016-09-27 – 2016-09-28 (×3): 250 mg via ORAL
  Filled 2016-09-27 (×3): qty 1

## 2016-09-27 MED ORDER — ADULT MULTIVITAMIN LIQUID CH
Freq: Every day | ORAL | Status: DC
Start: 2016-09-28 — End: 2016-09-29
  Administered 2016-09-28 – 2016-09-29 (×2): 236 mL via ORAL
  Filled 2016-09-27 (×2): qty 15

## 2016-09-27 NOTE — Discharge Instructions (Signed)
Please read and follow all provided instructions.  Your diagnoses today include:  1. Fall, initial encounter     Tests performed today include: Vital signs. See below for your results today.   Medications prescribed:  Take as prescribed   Home care instructions:  Follow any educational materials contained in this packet.  Follow-up instructions: Please follow-up with your primary care provider for further evaluation of symptoms and treatment   Return instructions:  Please return to the Emergency Department if you do not get better, if you get worse, or new symptoms OR  - Fever (temperature greater than 101.16F)  - Bleeding that does not stop with holding pressure to the area    -Severe pain (please note that you may be more sore the day after your accident)  - Chest Pain  - Difficulty breathing  - Severe nausea or vomiting  - Inability to tolerate food and liquids  - Passing out  - Skin becoming red around your wounds  - Change in mental status (confusion or lethargy)  - New numbness or weakness    Please return if you have any other emergent concerns.  Additional Information:  Your vital signs today were: BP (!) 165/108 (BP Location: Left Wrist)    Pulse 109    Temp 98.2 F (36.8 C) (Oral)    Resp 17    SpO2 100%  If your blood pressure (BP) was elevated above 135/85 this visit, please have this repeated by your doctor within one month. ---------------

## 2016-09-27 NOTE — ED Notes (Signed)
Pt's husband and daughter presented tonight w/ genuine concerns about pt going to [facility].  Pt's family reported that they are fine w/ a delay in placement.  Pt's family very tearful w/ the prospect of pt being so far from home.  Desired outcome is for placement at [facility]. Informed pt's family that we will delay calling Sheriff's office for transport to give family time to talk to psych in the AM.

## 2016-09-27 NOTE — BHH Counselor (Signed)
[facility] Assessment Progress Note  Pt has been accepted to [facility] per [facility]. She will be in room 322. Accepting doctor is Environmental health practitioner. Call report to [phone]. Pt can come anytime. She is IVC'd, so will be transported by Event organiser. Pt's RN, Meryl, notified.  Ashley [name]. Lovena Le, [place], [place], LPCA Counselor

## 2016-09-27 NOTE — Discharge Planning (Signed)
Note order for [facility] Nurse's Aid placed 1/11...will await outcome of TTS consult before arranging [facility] services.

## 2016-09-27 NOTE — ED Notes (Addendum)
Pt continues to be wandering the hallway and needs re-orientation. Family not currently at bedside.

## 2016-09-27 NOTE — BH Assessment (Signed)
[facility] Assessment Progress Note   Case was staffed with Reita Cliche DNP who recommended an inpatient admission. Patient will be re-evaluated in the a.m.

## 2016-09-27 NOTE — ED Notes (Signed)
Sheriff Department called and they told us to call back after 7pm but before 7am for transport.  Pt will be transported in the morning.  [facility] called and would like report in the morning since she will not be coming until the morning.

## 2016-09-27 NOTE — BHH Counselor (Signed)
Spoke with Fransico Meadow, PA-C who reports the pt's family has declined admission to [facility] and request the pt be admitted to [facility] for gero-psych treatment. Fransico Meadow, PA-C is unable to withdraw the pt from being admitted to [facility] due to the pt's status and urgent need for placement. Fransico Meadow, PA-C request that [facility] be contacted immediately in order to discuss placement options. Fransico Meadow, PA-C reports the family is aware if the pt is not admitted to [facility] and request [facility] only, she will have to remain in the ED until bed availability opens at [facility]. Fransico Meadow, PA-C also advised there is no current acceptance from [facility] pending therefore, there is no guarantee the pt will be accepted to [facility]. [place], PA-C I would contact [facility] to discuss the possibility of placement at their facility. Attempted to contact [facility] at [phone] however I did not get an answer. An urgent message was left with a callback number in order to discuss possible placement.  Lind Covert, MSW, Latanya Presser

## 2016-09-27 NOTE — Progress Notes (Signed)
Patient ID: Ashley [name], female   DOB: 1940/11/12, 76 y.o.   MRN: [id number]  My office received a call from Mr. Howe regarding this admission.  I saw [name] on 09/18/16.   She has a long history of nephrolithiasis and has a large stone burden that she has been reluctant to have treated.  She has been having RUQ pain for the last couple of months and a CT in our office last week showed some interval growth in the RUP stones with possible obstruction of the upper pole calyceal system on the non-contrast study.  A contrast study was ordered but I don't see that result yet.   Her urine has chronic pyuria and hematuria but cultures are routinely negative with the most recent on 09/12/16.   While is is possible that her current symptoms are aggravated by infection, I would make sure that a urine culture is obtained and it would be reasonable to get the CT Abd with IV contrast during this admission.     Please consult [place] if needed.    My plan was to proceed with a right percutaneous nephrolithotomy if the obstruction was confirmed on the contrast enhanced scan.

## 2016-09-27 NOTE — ED Provider Notes (Signed)
[facility] DEPT Provider Note   CSN: [id number] Arrival date & time: 09/27/16  [id number]     History   Chief Complaint Chief Complaint  Patient presents with  . Medical Clearance    HPI Ashley [name] is a 76 y.o. female.  The history is provided by the patient. No language interpreter was used.  Mental Health Problem  Presenting symptoms: aggressive behavior and bizarre behavior   Patient accompanied by:  Caregiver Degree of incapacity (severity):  Severe Onset quality:  Gradual Timing:  Constant Progression:  Worsening Treatment compliance:  Untreated Relieved by:  Nothing Risk factors: no family hx of mental illness   Pt seen yesterday for a fall.  Family here requesting psych evaluation.  Family reports pt was seen here yesterday after a fall.  Pt has had progression of hallucinations and confusion.  Family reports they can not care for pt at home.   Past Medical History:  Diagnosis Date  . Arthritis    back and neck  . Carotid artery stenosis   . Cerebral amyloid angiopathy (CODE) 06/20/2016  . Chronic rhinosinusitis   . DDD (degenerative disc disease)   . Depression   . Diverticulosis of colon   . Frequency of urination   . Glaucoma   . Headache disorder 12/13/2014   Right temple  . Hearing loss   . History of kidney stones    MULTIBLE SURGICAL INTERVENTIONS  . History of skin cancer HX TOPICAL FACIAL [place] 2013  . History of TIA (transient ischemic attack)    NOV 2013-- NO RESIDUAL  . Hypertension   . IBS (irritable bowel syndrome)   . Memory difficulty 05/14/2016  . Memory loss   . Mild asthma    COLD INDUCED  . Right ureteral stone   . Seasonal and perennial allergic rhinitis   . Shingles    EYEBALL 09-17-2013  . Spondylosis     Patient Active Problem List   Diagnosis Date Noted  . 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 Essential hypertension 07/15/2016  . TIA (transient ischemic attack)   . Expressive aphasia 07/14/2016  .  Cerebral amyloid angiopathy (CODE) 06/20/2016  . Memory difficulty 05/14/2016  . Headache disorder 12/13/2014  . Chest wall pain 05/24/2012  . Other general symptoms  12/12/2011  . Iron deficiency anemia, unspecified  12/12/2011  . Diverticulosis of colon (without mention of hemorrhage) 12/06/2011  . History of IBS 12/06/2011  . Calcium oxalate kidney stones 12/06/2011  . S/P cholecystectomy 12/06/2011  . Bilateral lower abdominal pain 12/06/2011  . Right ureteral stone 07/22/2011  . Hyperparathyroidism, primary ([place]) 04/22/2011  . RHINOSINUSITIS, ACUTE 09/04/2007  . ASTHMATIC BRONCHITIS, ACUTE 09/03/2007  . Seasonal and perennial allergic rhinitis 09/03/2007    Past Surgical History:  Procedure Laterality Date  . BUNIONECTOMY     BILATERAL  . CALDWELL LUC     sinus drainage procedure  . CYSTO/ BILATERAL URETEROSCOPIC STONE EXTRACTIONS  02-09-2003  . CYSTO/ BILATERAL URETEROSCOPY/  LASER OF STONES LEFT SIDE  06-02-2007  . CYSTOSCOPY/RETROGRADE/URETEROSCOPY/STONE EXTRACTION WITH BASKET  03/31/2012   Procedure: CYSTOSCOPY/RETROGRADE/URETEROSCOPY/STONE EXTRACTION WITH BASKET;  Surgeon: Malka So, MD;  Location[facility];  Service: Urology;  Laterality: Right;   . CYSTOSCOPY/RETROGRADE/URETEROSCOPY/STONE EXTRACTION WITH BASKET Right 03/29/2013   Procedure: CYSTOSCOPY//URETEROSCOPY/STONE EXTRACTION WITH BASKET;  Surgeon: Malka So, MD;  Location: [facility];  Service: Urology;  Laterality: Right;  . CYSTOSCOPY/RETROGRADE/URETEROSCOPY/STONE EXTRACTION WITH BASKET Right 10/14/2013   Procedure: CYSTOSCOPY/RETROGRADE/URETEROSCOPY/STONE EXTRACTION WITH BASKET;  Surgeon:  Irine Seal, MD;  Location: [facility];  Service: Urology;  Laterality: Right;  . ESOPHAGOGASTRODUODENOSCOPY (EGD) WITH PROPOFOL N/A 04/12/2015   Procedure: ESOPHAGOGASTRODUODENOSCOPY (EGD) WITH PROPOFOL;  Surgeon: Arta Silence, MD;  Location: [facility];  Service: Endoscopy;  Laterality: N/A;   . EUS N/A 04/12/2015   Procedure: ESOPHAGEAL ENDOSCOPIC ULTRASOUND (EUS) RADIAL;  Surgeon: Arta Silence, MD;  Location: [facility];  Service: Endoscopy;  Laterality: N/A;  . EXTRACORPOREAL SHOCK WAVE LITHOTRIPSY  about [id number] Dr Reece Agar   Never completed procedure-could not tolerate pain. Had seizures due to pain  . EYE SURGERY  AGE 70   INJURY  . HERNIA REPAIR  AGE 25  . HOLMIUM LASER APPLICATION Right [id number]   Procedure: HOLMIUM LASER APPLICATION;  Surgeon: Irine Seal, MD;  Location: [facility];  Service: Urology;  Laterality: Right;  . LAPAROSCOPIC CHOLECYSTECTOMY  01-12-2005  . LEFT URETEROSCOPIC STONE EXTRACTION    LAST ONE 04-28-2009   SEVERAL SURGICAL STONE EXTRACTIONS  . LUMBAR LAMINECTOMY  09-30-2002   L4 - 5;  SPONDYLOLISTHISES W/ STENOSIS AND RADICULOPATHY  . PERCUTANEOUS NEPHROSTOLITHOTOMY  1975  . RIGHT URETEROSCOPIC STONE EXTRACTION  LAST ONE 02-21-2011   MULTIPLE SURGICAL STONE EXTRACTIONS  . TONSILECTOMY, ADENOIDECTOMY, BILATERAL MYRINGOTOMY AND TUBES  CHILD  . VAGINAL HYSTERECTOMY  1991   W/ BILATERAL SALPINGOOPHECTOMY    OB History    No data available       Home Medications    Prior to Admission medications   Medication Sig Start Date End Date Taking? Authorizing Provider  benzonatate (TESSALON) 200 MG capsule Take 1 capsule (200 mg total) by mouth 3 (three) times daily as needed for cough. 08/22/16  Yes Deneise Lever, MD  desipramine (NOPRAMIN) 10 MG tablet Take 20 mg by mouth at bedtime.  09/03/16  Yes Historical Provider, MD  divalproex (DEPAKOTE ER) 250 MG 24 hr tablet Take 250 mg by mouth 2 (two) times daily. 09/06/16  Yes Historical Provider, MD  docusate sodium (COLACE) 100 MG capsule Take 100 mg by mouth daily as needed for mild constipation or moderate constipation.   Yes Historical Provider, MD  estradiol (ESTRACE) 1 MG tablet Take 1 mg by mouth daily.   Yes Historical Provider, MD  fish oil-omega-3 fatty acids 1000 MG capsule  Take 2 g by mouth daily.    Yes Historical Provider, MD  LORazepam (ATIVAN) 1 MG tablet Take 1 mg by mouth daily. 09/25/16  Yes Historical Provider, MD  Multiple Vitamin (MULTIVITAMIN PO) Take 1 tablet by mouth daily.    Yes Historical Provider, MD  pravastatin (PRAVACHOL) 40 MG tablet Take 40 mg by mouth daily.  04/11/16  Yes Historical Provider, MD  risperiDONE (RISPERDAL) 0.5 MG tablet Take 0.5 mg by mouth 2 (two) times daily. 09/25/16  Yes Historical Provider, MD  TEKTURNA 300 MG tablet Take 150 mg by mouth daily.  07/18/16  Yes Historical Provider, MD  traMADol (ULTRAM) 50 MG tablet Take 50 mg by mouth 4 (four) times daily as needed for pain. 09/20/16  Yes Historical Provider, MD  valACYclovir (VALTREX) 1000 MG tablet Take 1 g by mouth 3 (three) times daily as needed (cold sores).  08/27/16  Yes Historical Provider, MD  Fluticasone-Umeclidin-Vilant (TRELEGY ELLIPTA) 100-62.5-25 MCG/INH AEPB Inhale 1 puff into the lungs daily. RINSE MOUTH AFTER USE Patient not taking: Reported on 09/27/2016 08/22/16   Deneise Lever, MD    Family History Family History  Problem Relation Age of Onset  . COPD Mother   .  Breast cancer Mother   . Colon cancer Neg Hx   . Migraines Neg Hx     Social History Social History  Substance Use Topics  . Smoking status: Former Smoker    Years: 23.00    Types: Cigarettes    Quit date: 09/16/1985  . Smokeless tobacco: Never Used  . Alcohol use Yes     Comment: social - weekend     Allergies   Patient has no known allergies.   Review of Systems Review of Systems  Musculoskeletal: Positive for joint swelling.  All other systems reviewed and are negative.    Physical Exam Updated Vital Signs BP 129/58 (BP Location: Left Arm)   Pulse 91   Temp 97.5 F (36.4 C) (Oral)   Resp 20   SpO2 96%   Physical Exam  Constitutional: She appears well-developed and well-nourished.  HENT:  Head: Normocephalic.  Mouth/Throat: Oropharynx is clear and moist.  Eyes:  Conjunctivae and EOM are normal. Pupils are equal, round, and reactive to light.  Neck: Normal range of motion.  Cardiovascular: Normal rate and regular rhythm.   Pulmonary/Chest: Effort normal and breath sounds normal.  Abdominal: She exhibits no distension.  Musculoskeletal:     Neurological: She is alert.  Confused.  Pt reports she is here to have a baby.   Skin: Skin is warm.  Psychiatric:  Confused. Seems to be having auditory hallucinations  Nursing note and vitals reviewed.    ED Treatments / Results  Labs (all labs ordered are listed, but only abnormal results are displayed) Labs Reviewed - No data to display  EKG  EKG Interpretation None       Radiology Dg Chest 2 View  Result Date: 09/27/2016 CLINICAL DATA:  Status post unwitnessed fall. Confusion. Initial encounter. EXAM: CHEST  2 VIEW COMPARISON:  Chest radiograph performed 08/22/2016 FINDINGS: The lungs are well-aerated and clear. There is no evidence of focal opacification, pleural effusion or pneumothorax. The heart is normal in size; the mediastinal contour is within normal limits. No acute osseous abnormalities are seen. Clips are noted within the right upper quadrant, reflecting prior cholecystectomy. IMPRESSION: No acute cardiopulmonary process seen. No displaced rib fractures identified. Electronically Signed   By: Garald Balding M.D.   On: 09/27/2016 00:12   Ct Head Wo Contrast  Result Date: 09/26/2016 CLINICAL DATA:  Unwitnessed fall this evening. EXAM: CT HEAD WITHOUT CONTRAST CT CERVICAL SPINE WITHOUT CONTRAST TECHNIQUE: Multidetector CT imaging of the head and cervical spine was performed following the standard protocol without intravenous contrast. Multiplanar CT image reconstructions of the cervical spine were also generated. COMPARISON:  None. MRI brain 07/14/2016. CT head 07/14/2016. CT head and cervical spine 06/22/2016. FINDINGS: CT HEAD FINDINGS Brain: Mild cerebral atrophy. Mild ventricular  dilatation consistent with central atrophy. Low-attenuation changes in the deep white matter consistent with small vessel ischemia. No abnormal extra-axial fluid collections. No mass effect or midline shift. Gray-white matter junctions are distinct. Basal cisterns are not effaced. No acute intracranial hemorrhage. Vascular: Vascular calcifications are present. Skull: Normal. Negative for fracture or focal lesion. Sinuses/Orbits: No acute finding. Other: None. CT CERVICAL SPINE FINDINGS Alignment: Reversal of the usual cervical lordosis is unchanged since prior study and likely degenerative change. Slight anterior subluxation at C3-4 and C7-T1 is also unchanged. Normal alignment of the facet joints. C1-2 articulation appears intact. Skull base and vertebrae: Degenerative cysts in the odontoid process. No acute fracture. No primary bone lesion or focal pathologic process. Soft tissues and spinal canal:  No prevertebral fluid or swelling. No visible canal hematoma. Disc levels: Diffuse degenerative change throughout the cervical spine with narrowed interspaces and associated endplate hypertrophic changes. Degenerative changes are most prominent at C5-6 and C6-7 levels. Degenerative changes throughout the facet joints. Upper chest: Aortic atherosclerosis. Visualized lung apices are clear. Other: Vascular calcifications in the cervical carotid and vertebral arteries. IMPRESSION: No acute intracranial abnormalities. Mild chronic atrophy and small vessel ischemic changes. Alignment of the cervical spine is unchanged since prior study. Diffuse degenerative changes in the cervical spine. No acute displaced fractures are identified. Electronically Signed   By: Lucienne Capers M.D.   On: 09/26/2016 23:08   Ct Cervical Spine Wo Contrast  Result Date: 09/26/2016 CLINICAL DATA:  Unwitnessed fall this evening. EXAM: CT HEAD WITHOUT CONTRAST CT CERVICAL SPINE WITHOUT CONTRAST TECHNIQUE: Multidetector CT imaging of the head and  cervical spine was performed following the standard protocol without intravenous contrast. Multiplanar CT image reconstructions of the cervical spine were also generated. COMPARISON:  None. MRI brain 07/14/2016. CT head 07/14/2016. CT head and cervical spine 06/22/2016. FINDINGS: CT HEAD FINDINGS Brain: Mild cerebral atrophy. Mild ventricular dilatation consistent with central atrophy. Low-attenuation changes in the deep white matter consistent with small vessel ischemia. No abnormal extra-axial fluid collections. No mass effect or midline shift. Gray-white matter junctions are distinct. Basal cisterns are not effaced. No acute intracranial hemorrhage. Vascular: Vascular calcifications are present. Skull: Normal. Negative for fracture or focal lesion. Sinuses/Orbits: No acute finding. Other: None. CT CERVICAL SPINE FINDINGS Alignment: Reversal of the usual cervical lordosis is unchanged since prior study and likely degenerative change. Slight anterior subluxation at C3-4 and C7-T1 is also unchanged. Normal alignment of the facet joints. C1-2 articulation appears intact. Skull base and vertebrae: Degenerative cysts in the odontoid process. No acute fracture. No primary bone lesion or focal pathologic process. Soft tissues and spinal canal: No prevertebral fluid or swelling. No visible canal hematoma. Disc levels: Diffuse degenerative change throughout the cervical spine with narrowed interspaces and associated endplate hypertrophic changes. Degenerative changes are most prominent at C5-6 and C6-7 levels. Degenerative changes throughout the facet joints. Upper chest: Aortic atherosclerosis. Visualized lung apices are clear. Other: Vascular calcifications in the cervical carotid and vertebral arteries. IMPRESSION: No acute intracranial abnormalities. Mild chronic atrophy and small vessel ischemic changes. Alignment of the cervical spine is unchanged since prior study. Diffuse degenerative changes in the cervical spine.  No acute displaced fractures are identified. Electronically Signed   By: Lucienne Capers M.D.   On: 09/26/2016 23:08    Procedures Procedures (including critical care time)  Medications Ordered in ED Medications - No data to display   Initial Impression / Assessment and Plan / ED Course  I have reviewed the triage vital signs and the nursing notes.  Pertinent labs & imaging results that were available during my care of the patient were reviewed by me and considered in my medical decision making (see chart for details).  Clinical Course     Pt had labs and ct scan yesterday.  TTS consulted.   Dr. Oleta Mouse in to see.  Pt is obvious danger to herself.  Possible danger to others.  TTS advised involuntary commitment.   I spoke to pt Psychiatrist who ask advises inpatient treatment.  He would like pt to go to [facility] if possible.  Pt accepted at [facility].  Rn reports husband upset because he wants pt to be closer to home.  I spoke to him on the phone.  TTS will recontact [facility].  I explained to him if pt does not take bed she could be here for several days.   Final Clinical Impressions(s) / ED Diagnoses   Final diagnoses:  Medical clearance for psychiatric admission  Psychosis, unspecified psychosis type    New Prescriptions New Prescriptions   No medications on file     Fransico Meadow, PA-C 09/27/16 [id number]    Forde Dandy, MD 09/28/16 [phone]

## 2016-09-27 NOTE — BH Assessment (Addendum)
Assessment Note  Ashley [name] is an 76 y.o. female that presents this date due to memory loss. Patient is accompanied by family although patient states she feels this is "a Scientist, water quality" and requests husband and daughter to leave the room while assessment is conducted. Patient admits to ongoing family stressors associated with family "trying to get her help not needed." Patient denies any S/I, H/I or AVH. Per admission note patient has been having psychotic episodes and experiencing AVH although patient denies during assessment. Patient is very tangential and is difficult to redirect during assessment. Patient has some remote recall in reference to her history and discusses her early life, education level, career etc. Patient does not have any immediate recall and is not oriented to time/place. Patient could not recall the word "hospital" but explained what a hospital was. Patient gives limited history but denies any previous inpatient/outpatient treatment for any MH issues. Patient becomes tearful during the assessment process and states she "wanted to nap." Patient speaks incoherently at times and at times, seems to be responding to internal stimuli. Patient has a history of depression per record review but denies during assessment. Patient denies ever being on any MH medications although that information is not accurate per note review. Patient does consent to voluntary treatment "for today." Per admission note: "Pt had a TIA about 6 months ago. Since that time pt has been having psychotic episodes involving hallucinations both audible and visual. Pt has a long Hx of kidney stones as well and her Dr at [facility] has asked the family to [facility] her top [facility] today to have her medically cleared for invasive kidney surgery and to have a psych eval while she is here". Case was staffed with Reita Cliche DNP who recommended an inpatient admission. Patient will be re-evaluated in the a.m". Case was staffed with  Reita Cliche DNP who recommended an inpatient admission. Patient will be re-evaluated in the a.m.    Diagnosis: MDD with psychotic features, severe  Past Medical History:  Past Medical History:  Diagnosis Date  . Arthritis    back and neck  . Carotid artery stenosis   . Cerebral amyloid angiopathy (CODE) 06/20/2016  . Chronic rhinosinusitis   . DDD (degenerative disc disease)   . Depression   . Diverticulosis of colon   . Frequency of urination   . Glaucoma   . Headache disorder 12/13/2014   Right temple  . Hearing loss   . History of kidney stones    MULTIBLE SURGICAL INTERVENTIONS  . History of skin cancer HX TOPICAL FACIAL Laura 2013  . History of TIA (transient ischemic attack)    NOV 2013-- NO RESIDUAL  . Hypertension   . IBS (irritable bowel syndrome)   . Memory difficulty 05/14/2016  . Memory loss   . Mild asthma    COLD INDUCED  . Right ureteral stone   . Seasonal and perennial allergic rhinitis   . Shingles    EYEBALL 09-17-2013  . Spondylosis     Past Surgical History:  Procedure Laterality Date  . BUNIONECTOMY     BILATERAL  . CALDWELL LUC     sinus drainage procedure  . CYSTO/ BILATERAL URETEROSCOPIC STONE EXTRACTIONS  02-09-2003  . CYSTO/ BILATERAL URETEROSCOPY/  LASER OF STONES LEFT SIDE  06-02-2007  . CYSTOSCOPY/RETROGRADE/URETEROSCOPY/STONE EXTRACTION WITH BASKET  03/31/2012   Procedure: CYSTOSCOPY/RETROGRADE/URETEROSCOPY/STONE EXTRACTION WITH BASKET;  Surgeon: Malka So, MD;  [facility];  Service: Urology;  Laterality: Right;   .  CYSTOSCOPY/RETROGRADE/URETEROSCOPY/STONE EXTRACTION WITH BASKET Right 03/29/2013   Procedure: CYSTOSCOPY//URETEROSCOPY/STONE EXTRACTION WITH BASKET;  Surgeon: Malka So, MD;  [facility];  Service: Urology;  Laterality: Right;  . CYSTOSCOPY/RETROGRADE/URETEROSCOPY/STONE EXTRACTION WITH BASKET Right 10/14/2013   Procedure: CYSTOSCOPY/RETROGRADE/URETEROSCOPY/STONE EXTRACTION WITH BASKET;  Surgeon:  Irine Seal, MD;  Location[facility];  Service: Urology;  Laterality: Right;  . ESOPHAGOGASTRODUODENOSCOPY (EGD) WITH PROPOFOL N/A 04/12/2015   Procedure: ESOPHAGOGASTRODUODENOSCOPY (EGD) WITH PROPOFOL;  Surgeon: Arta Silence, MD;  Location[facility];  Service: Endoscopy;  Laterality: N/A;  . EUS N/A 04/12/2015   Procedure: ESOPHAGEAL ENDOSCOPIC ULTRASOUND (EUS) RADIAL;  Surgeon: Arta Silence, MD;  Location[facility];  Service: Endoscopy;  Laterality: N/A;  . EXTRACORPOREAL SHOCK WAVE LITHOTRIPSY  about [id number] Dr Reece Agar   Never completed procedure-could not tolerate pain. Had seizures due to pain  . EYE SURGERY  AGE 32   INJURY  . HERNIA REPAIR  AGE 47  . HOLMIUM LASER APPLICATION Right [id number]   Procedure: HOLMIUM LASER APPLICATION;  Surgeon: Irine Seal, MD;  Location[facility];  Service: Urology;  Laterality: Right;  . LAPAROSCOPIC CHOLECYSTECTOMY  01-12-2005  . LEFT URETEROSCOPIC STONE EXTRACTION    LAST ONE 04-28-2009   SEVERAL SURGICAL STONE EXTRACTIONS  . LUMBAR LAMINECTOMY  09-30-2002   L4 - 5;  SPONDYLOLISTHISES W/ STENOSIS AND RADICULOPATHY  . PERCUTANEOUS NEPHROSTOLITHOTOMY  1975  . RIGHT URETEROSCOPIC STONE EXTRACTION  LAST ONE 02-21-2011   MULTIPLE SURGICAL STONE EXTRACTIONS  . TONSILECTOMY, ADENOIDECTOMY, BILATERAL MYRINGOTOMY AND TUBES  CHILD  . VAGINAL HYSTERECTOMY  1991   W/ BILATERAL SALPINGOOPHECTOMY    Family History:  Family History  Problem Relation Age of Onset  . COPD Mother   . Breast cancer Mother   . Colon cancer Neg Hx   . Migraines Neg Hx     Social History:  reports that she quit smoking about 31 years ago. Her smoking use included Cigarettes. She quit after 23.00 years of use. She has never used smokeless tobacco. She reports that she drinks alcohol. She reports that she does not use drugs.  Additional Social History:  Alcohol / Drug Use Pain Medications: See MAR Prescriptions: See MAR Over the  Counter: See MAR History of alcohol / drug use?: No history of alcohol / drug abuse (pt denies)  CIWA: CIWA-Ar BP: 129/58 Pulse Rate: 91 COWS:    Allergies: No Known Allergies  Home Medications:  (Not in a hospital admission)  OB/GYN Status:  No LMP recorded. Patient has had a hysterectomy.  General Assessment Data [facility] of [facility]: [facility] Assessment: In system Is this a Tele or Face-to-Face Assessment?: Face-to-Face Is this an Initial Assessment or a Re-assessment for this encounter?: Initial Assessment Marital status: Married [place] name: na Is patient pregnant?: No Pregnancy Status: No Living Arrangements: Spouse/significant other Can pt return to current living arrangement?: Yes Admission Status: Voluntary Is patient capable of signing voluntary admission?: Yes Referral Source: Self/Family/Friend  Medical Screening Exam ([place]) Medical Exam completed: Yes  Crisis Care Plan Living Arrangements: Spouse/significant other Legal Guardian:  (na) Name of Psychiatrist: None Name of Therapist: None  Education Status Is patient currently in school?: No Current Grade: na Highest grade of school patient has completed:  (MA) Name of school: na Contact person: na  Risk to self with the past 6 months Suicidal Ideation: No Has patient been a risk to self within the past 6 months prior to admission? : No Suicidal Intent: No Has patient  had any suicidal intent within the past 6 months prior to admission? : No Is patient at risk for suicide?: No Suicidal Plan?: No Has patient had any suicidal plan within the past 6 months prior to admission? : No Access to Means: No What has been your use of drugs/alcohol within the last 12 months?: Denies Previous Attempts/Gestures: No How many times?: 0 Other Self Harm Risks: na Triggers for Past Attempts:  (na) Intentional Self Injurious Behavior: None Family Suicide History: No Recent stressful life event(s):  Other (Comment) (relationship issues) Persecutory voices/beliefs?: No Depression: No Depression Symptoms:  (na) Substance abuse history and/or treatment for substance abuse?: No Suicide prevention information given to non-admitted patients: Not applicable  Risk to Others within the past 6 months Homicidal Ideation: No Does patient have any lifetime risk of violence toward others beyond the six months prior to admission? : No Thoughts of Harm to Others: No Current Homicidal Intent: No Current Homicidal Plan: No Access to Homicidal Means: No Identified Victim: na History of harm to others?: No Assessment of Violence: None Noted Violent Behavior Description: na Does patient have access to weapons?: No Criminal Charges Pending?: No Does patient have a court date: No Is patient on probation?: No  Psychosis Hallucinations: None noted Delusions: None noted        ADLScreening [facility]) Patient's cognitive ability adequate to safely complete daily activities?: Yes Patient able to express need for assistance with ADLs?: Yes Independently performs ADLs?: Yes (appropriate for developmental age)  Prior Inpatient Therapy Prior Inpatient Therapy: No     ADL Screening (condition at time of admission) Patient's cognitive ability adequate to safely complete daily activities?: Yes Is the patient deaf or have difficulty hearing?: No Does the patient have difficulty seeing, even when wearing glasses/contacts?: No Does the patient have difficulty concentrating, remembering, or making decisions?: Yes Patient able to express need for assistance with ADLs?: Yes Does the patient have difficulty dressing or bathing?: No Independently performs ADLs?: Yes (appropriate for developmental age) Does the patient have difficulty walking or climbing stairs?: No Weakness of Legs: None Weakness of Arms/Hands: None  Home Assistive Devices/Equipment Home Assistive Devices/Equipment:  None  Therapy Consults (therapy consults require a physician order) PT Evaluation Needed: No OT Evalulation Needed: No SLP Evaluation Needed: No Abuse/Neglect Assessment (Assessment to be complete while patient is alone) Physical Abuse: Denies Verbal Abuse: Denies Sexual Abuse: Denies Exploitation of patient/patient's resources: Denies Self-Neglect: Denies Values / Beliefs Cultural Requests During Hospitalization: None Spiritual Requests During Hospitalization: None Consults Spiritual Care Consult Needed: No Social Work Consult Needed: No Regulatory affairs officer (For Healthcare) Does Patient Have a Medical Advance Directive?: Yes Does patient want to make changes to medical advance directive?: No - Patient declined Type of Advance Directive: Press photographer (husband) Would patient like information on creating a medical advance directive?: No - Patient declined          Disposition: Case was staffed with Reita Cliche DNP who recommended an inpatient admission. Patient will be re-evaluated in the a.m.   Disposition Initial Assessment Completed for this Encounter: Yes Disposition of Patient: Inpatient treatment program Type of inpatient treatment program: Adult  On Site Evaluation by:   Reviewed with Physician:    Mamie Nick 09/27/2016 1:30 PM

## 2016-09-27 NOTE — BH Assessment (Addendum)
[place] Assessment Progress Note  Per Waylan Boga, DNP, this pt requires psychiatric hospitalization at this time.  Pt has been placed under IVC by EDP Brantley Stage, MD; as of this writing, service of Findings and Custody Order is pending.  The following facilities have been contacted to seek placement for this pt, with results as noted:  Beds available, information sent, decision pending:  [place]:  [place], [place] Triage Specialist [phone]

## 2016-09-27 NOTE — BH Assessment (Signed)
This assessor attempted to contact [place] again at [phone] in order to discuss the possibility of placement but I did not get an answer. A voicemail was left with a callback number given in order to follow up and discuss possible placement.   Lind Covert, MSW, [place] Presser

## 2016-09-27 NOTE — BH Assessment (Signed)
[place] Assessment Progress Note Patient was IVCed this date by Oleta Mouse MD as bed placement is investigated.

## 2016-09-27 NOTE — ED Triage Notes (Signed)
Pt had a TIA about 6 months ago.  Since that  Time Pt has been having psychotic episodes involving hallucinations both audible and visual.  Pt has a long Hx of kidney stones as well and her Dr at [facility] has asked the family to bring her top [facility] today to have her medically cleared for invasive kidney surgery and to have a psych eval while she is here.

## 2016-09-28 DIAGNOSIS — F068 Other specified mental disorders due to known physiological condition: Secondary | ICD-10-CM | POA: Diagnosis present

## 2016-09-28 DIAGNOSIS — Z87891 Personal history of nicotine dependence: Secondary | ICD-10-CM

## 2016-09-28 DIAGNOSIS — Z803 Family history of malignant neoplasm of breast: Secondary | ICD-10-CM

## 2016-09-28 DIAGNOSIS — N390 Urinary tract infection, site not specified: Secondary | ICD-10-CM | POA: Diagnosis present

## 2016-09-28 DIAGNOSIS — Z79899 Other long term (current) drug therapy: Secondary | ICD-10-CM

## 2016-09-28 DIAGNOSIS — Z8489 Family history of other specified conditions: Secondary | ICD-10-CM

## 2016-09-28 DIAGNOSIS — Z043 Encounter for examination and observation following other accident: Secondary | ICD-10-CM | POA: Diagnosis not present

## 2016-09-28 LAB — TSH: TSH: 0.832 u[IU]/mL (ref 0.350–4.500)

## 2016-09-28 MED ORDER — DIVALPROEX SODIUM ER 500 MG PO TB24
500.0000 mg | ORAL_TABLET | Freq: Two times a day (BID) | ORAL | Status: DC
  Administered 2016-09-28 – 2016-09-29 (×2): 500 mg via ORAL
  Filled 2016-09-28 (×2): qty 1

## 2016-09-28 MED ORDER — ZOLPIDEM TARTRATE 5 MG PO TABS
5.0000 mg | ORAL_TABLET | Freq: Once | ORAL | Status: AC
  Administered 2016-09-28: 5 mg via ORAL
  Filled 2016-09-28: qty 1

## 2016-09-28 NOTE — ED Notes (Signed)
Family at bedside with patient.  Patient resting now but did not sleep at all last night according to night nurse.  Ate just a few bites of her breakfast.

## 2016-09-28 NOTE — ED Notes (Signed)
Family requested that ED check patient's thyroid levels.  Dr. Eulis Foster notified.  Labs ordered.

## 2016-09-28 NOTE — Consult Note (Addendum)
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 Urinary tract infection [N39.0] 09/28/2016    Priority: High  . 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 Essential hypertension [I10] 07/15/2016  . TIA (transient ischemic attack) [G45.9]   . Expressive aphasia [R47.01] 07/14/2016  . Cerebral amyloid angiopathy (CODE) [I68.0] 06/20/2016  . Memory difficulty [R41.3] 05/14/2016  . Headache disorder [R51] 12/13/2014  . Chest wall pain [R07.89] 05/24/2012  . Other general symptoms  [R68.89] 12/12/2011  . Iron deficiency anemia, unspecified  [D50.9] 12/12/2011  . Diverticulosis of colon (without mention of hemorrhage) [K57.30] 12/06/2011  . History of IBS [Z87.19] 12/06/2011  . Calcium oxalate kidney stones [N20.0] 12/06/2011  . S/P cholecystectomy [Z90.49] 12/06/2011  . Bilateral lower abdominal pain [R10.31, R10.32] 12/06/2011  . Right ureteral stone [N20.1] 07/22/2011  . Hyperparathyroidism, primary ([place]) [E21.0] 04/22/2011  . RHINOSINUSITIS, ACUTE [J01.80] 09/04/2007  . ASTHMATIC BRONCHITIS, ACUTE [J20.9] 09/03/2007  . Seasonal and perennial allergic rhinitis [J30.89, J30.2] 09/03/2007    Total Time spent with patient: 45 minutes  Subjective:   CECILLE MCCLUSKY is a 76 y.o. female patient admitted with psychosis.  HPI:  76 yo female who was brought to the ED with psychosis, urinary tract infection noted and being treated.  The patient was confused and wandering in the ED.  Her family is concerned that her psychosis was acute onset, discussed with them at length.  She has chronic pain related to frequent kidney stones.  Her behaviors started abruptly in the past couple of weeks.  They  took her to a psychiatrist who started Depakote but her hallucinations continued so he started Risperdal but has had only had two doses along with Ativan but only a few doses.  The patient did not improve so they brought her here.  No suicidal/homicidal ideations or alcohol/drug abuse.  Past Psychiatric History: none  Risk to Self: Suicidal Ideation: No Suicidal Intent: No Is patient at risk for suicide?: No Suicidal Plan?: No Access to Means: No What has been your use of drugs/alcohol within the last 12 months?: Denies How many times?: 0 Other Self Harm Risks: na Triggers for Past Attempts:  (na) Intentional Self Injurious Behavior: None Risk to Others: Homicidal Ideation: No Thoughts of Harm to Others: No Current Homicidal Intent: No Current Homicidal Plan: No Access to Homicidal Means: No Identified Victim: na History of harm to others?: No Assessment of Violence: None Noted Violent Behavior Description: na Does patient have access to weapons?: No Criminal Charges Pending?: No Does patient have a court date: No Prior Inpatient Therapy: Prior Inpatient Therapy: No Prior Therapy Dates: na Prior Therapy Facilty/Provider(s): na Reason for Treatment: na Prior Outpatient Therapy: Prior Outpatient Therapy: No Prior Therapy Dates: na Prior Therapy Facilty/Provider(s): na Reason for Treatment: na Does patient have an ACCT team?: No Does patient have Intensive In-House Services?  : No Does patient have [facility] services? : No Does patient have P4CC services?: No  Past Medical History:  Past Medical History:  Diagnosis Date  . Arthritis    back and neck  . Carotid artery stenosis   . Cerebral amyloid angiopathy (CODE) 06/20/2016  . Chronic rhinosinusitis   . DDD (degenerative disc disease)   .  Depression   . Diverticulosis of colon   . Frequency of urination   . Glaucoma   . Headache disorder 12/13/2014   Right temple  . Hearing loss   . History of kidney stones     MULTIBLE SURGICAL INTERVENTIONS  . History of skin cancer HX TOPICAL FACIAL TX JULY 2013  . History of TIA (transient ischemic attack)    NOV 2013-- NO RESIDUAL  . Hypertension   . IBS (irritable bowel syndrome)   . Memory difficulty 05/14/2016  . Memory loss   . Mild asthma    COLD INDUCED  . Right ureteral stone   . Seasonal and perennial allergic rhinitis   . Shingles    EYEBALL 09-17-2013  . Spondylosis     Past Surgical History:  Procedure Laterality Date  . BUNIONECTOMY     BILATERAL  . CALDWELL LUC     sinus drainage procedure  . CYSTO/ BILATERAL URETEROSCOPIC STONE EXTRACTIONS  02-09-2003  . CYSTO/ BILATERAL URETEROSCOPY/  LASER OF STONES LEFT SIDE  06-02-2007  . CYSTOSCOPY/RETROGRADE/URETEROSCOPY/STONE EXTRACTION WITH BASKET  03/31/2012   Procedure: CYSTOSCOPY/RETROGRADE/URETEROSCOPY/STONE EXTRACTION WITH BASKET;  Surgeon: Anner Crete, MD;  Location: [facility];  Service: Urology;  Laterality: Right;   . CYSTOSCOPY/RETROGRADE/URETEROSCOPY/STONE EXTRACTION WITH BASKET Right 03/29/2013   Procedure: CYSTOSCOPY//URETEROSCOPY/STONE EXTRACTION WITH BASKET;  Surgeon: Anner Crete, MD;  Location: [facility];  Service: Urology;  Laterality: Right;  . CYSTOSCOPY/RETROGRADE/URETEROSCOPY/STONE EXTRACTION WITH BASKET Right 10/14/2013   Procedure: CYSTOSCOPY/RETROGRADE/URETEROSCOPY/STONE EXTRACTION WITH BASKET;  Surgeon: Bjorn Pippin, MD;  Location: [facility];  Service: Urology;  Laterality: Right;  . ESOPHAGOGASTRODUODENOSCOPY (EGD) WITH PROPOFOL N/A 04/12/2015   Procedure: ESOPHAGOGASTRODUODENOSCOPY (EGD) WITH PROPOFOL;  Surgeon: Willis Modena, MD;  Location: [facility];  Service: Endoscopy;  Laterality: N/A;  . EUS N/A 04/12/2015   Procedure: ESOPHAGEAL ENDOSCOPIC ULTRASOUND (EUS) RADIAL;  Surgeon: Willis Modena, MD;  Location: [facility];  Service: Endoscopy;  Laterality: N/A;  . EXTRACORPOREAL SHOCK WAVE LITHOTRIPSY  about 1997/ Dr Wanda Plump   Never  completed procedure-could not tolerate pain. Had seizures due to pain  . EYE SURGERY  AGE 44   INJURY  . HERNIA REPAIR  AGE 43  . HOLMIUM LASER APPLICATION Right 10/14/2013   Procedure: HOLMIUM LASER APPLICATION;  Surgeon: Bjorn Pippin, MD;  Location: [facility];  Service: Urology;  Laterality: Right;  . LAPAROSCOPIC CHOLECYSTECTOMY  01-12-2005  . LEFT URETEROSCOPIC STONE EXTRACTION    LAST ONE 04-28-2009   SEVERAL SURGICAL STONE EXTRACTIONS  . LUMBAR LAMINECTOMY  09-30-2002   L4 - 5;  SPONDYLOLISTHISES W/ STENOSIS AND RADICULOPATHY  . PERCUTANEOUS NEPHROSTOLITHOTOMY  1975  . RIGHT URETEROSCOPIC STONE EXTRACTION  LAST ONE 02-21-2011   MULTIPLE SURGICAL STONE EXTRACTIONS  . TONSILECTOMY, ADENOIDECTOMY, BILATERAL MYRINGOTOMY AND TUBES  CHILD  . VAGINAL HYSTERECTOMY  1991   W/ BILATERAL SALPINGOOPHECTOMY   Family History:  Family History  Problem Relation Age of Onset  . COPD Mother   . Breast cancer Mother   . Colon cancer Neg Hx   . Migraines Neg Hx    Family Psychiatric  History: none Social History:  History  Alcohol Use  . Yes    Comment: social - weekend     History  Drug Use No    Social History   Social History  . Marital status: Married    Spouse name: N/A  . Number of children: 2  . Years of education: masters   Occupational History  . travel coordinator/travel  agent     owner   Social History Main Topics  . Smoking status: Former Smoker    Years: 23.00    Types: Cigarettes    Quit date: 09/16/1985  . Smokeless tobacco: Never Used  . Alcohol use Yes     Comment: social - weekend  . Drug use: No  . Sexual activity: Not Asked   Other Topics Concern  . None   Social History Narrative   Daily caffeine-1 cup    Patient is right handed.   Lives at home with husband.   Additional Social History:    Allergies:  No Known Allergies  Labs:  Results for orders placed or performed during the hospital encounter of 09/26/16 (from the past 48  hour(s))  CBC     Status: Abnormal   Collection Time: 09/26/16 11:44 PM  Result Value Ref Range   WBC 6.8 4.0 - 10.5 K/uL   RBC 4.29 3.87 - 5.11 MIL/uL   Hemoglobin 13.8 12.0 - 15.0 g/dL   HCT 32.1 12.6 - 99.8 %   MCV 93.9 78.0 - 100.0 fL   MCH 32.2 26.0 - 34.0 pg   MCHC 34.2 30.0 - 36.0 g/dL   RDW 83.7 11.0 - 21.7 %   Platelets 143 (L) 150 - 400 K/uL  Basic metabolic panel     Status: Abnormal   Collection Time: 09/26/16 11:44 PM  Result Value Ref Range   Sodium 140 135 - 145 mmol/L   Potassium 4.2 3.5 - 5.1 mmol/L   Chloride 108 101 - 111 mmol/L   CO2 26 22 - 32 mmol/L   Glucose, Bld 108 (H) 65 - 99 mg/dL   BUN 17 6 - 20 mg/dL   Creatinine, Ser 5.81 (H) 0.44 - 1.00 mg/dL   Calcium 9.0 8.9 - 79.9 mg/dL   GFR calc non Af Amer 48 (L) >60 mL/min   GFR calc Af Amer 55 (L) >60 mL/min    Comment: (NOTE) The eGFR has been calculated using the CKD EPI equation. This calculation has not been validated in all clinical situations. eGFR's persistently <60 mL/min signify possible Chronic Kidney Disease.    Anion gap 6 5 - 15  Urinalysis, Routine w reflex microscopic     Status: Abnormal   Collection Time: 09/27/16 12:51 AM  Result Value Ref Range   Color, Urine YELLOW YELLOW   APPearance HAZY (A) CLEAR   Specific Gravity, Urine 1.012 1.005 - 1.030   pH 6.0 5.0 - 8.0   Glucose, UA NEGATIVE NEGATIVE mg/dL   Hgb urine dipstick MODERATE (A) NEGATIVE   Bilirubin Urine NEGATIVE NEGATIVE   Ketones, ur 20 (A) NEGATIVE mg/dL   Protein, ur NEGATIVE NEGATIVE mg/dL   Nitrite NEGATIVE NEGATIVE   Leukocytes, UA SMALL (A) NEGATIVE   RBC / HPF TOO NUMEROUS TO COUNT 0 - 5 RBC/hpf   WBC, UA TOO NUMEROUS TO COUNT 0 - 5 WBC/hpf   Bacteria, UA FEW (A) NONE SEEN   Squamous Epithelial / LPF 6-30 (A) NONE SEEN   Mucous PRESENT     Current Facility-Administered Medications  Medication Dose Route Frequency Provider Last Rate Last Dose  . aliskiren (TEKTURNA) tablet 150 mg  150 mg Oral Daily Charm Rings, NP   150 mg at 09/28/16 1037  . cephALEXin (KEFLEX) capsule 500 mg  500 mg Oral Q8H Charm Rings, NP   500 mg at 09/28/16 0653  . divalproex (DEPAKOTE ER) 24 hr tablet 250 mg  250  mg Oral BID Patrecia Pour, NP   250 mg at 09/28/16 1036  . docusate sodium (COLACE) capsule 100 mg  100 mg Oral Daily PRN Patrecia Pour, NP      . multivitamin liquid   Oral Daily Patrecia Pour, NP   236 mL at 09/28/16 1037  . pravastatin (PRAVACHOL) tablet 40 mg  40 mg Oral QPM Patrecia Pour, NP   40 mg at 09/27/16 1834  . risperiDONE (RISPERDAL) tablet 0.5 mg  0.5 mg Oral BID Patrecia Pour, NP   0.5 mg at 09/28/16 1037   Current Outpatient Prescriptions  Medication Sig Dispense Refill  . benzonatate (TESSALON) 200 MG capsule Take 1 capsule (200 mg total) by mouth 3 (three) times daily as needed for cough. 30 capsule 1  . desipramine (NOPRAMIN) 10 MG tablet Take 20 mg by mouth at bedtime.     . divalproex (DEPAKOTE ER) 250 MG 24 hr tablet Take 250 mg by mouth 2 (two) times daily.    Marland Kitchen docusate sodium (COLACE) 100 MG capsule Take 100 mg by mouth daily as needed for mild constipation or moderate constipation.    Marland Kitchen estradiol (ESTRACE) 1 MG tablet Take 1 mg by mouth daily.    . fish oil-omega-3 fatty acids 1000 MG capsule Take 2 g by mouth daily.     Marland Kitchen LORazepam (ATIVAN) 1 MG tablet Take 1 mg by mouth daily.    . Multiple Vitamin (MULTIVITAMIN PO) Take 1 tablet by mouth daily.     . pravastatin (PRAVACHOL) 40 MG tablet Take 40 mg by mouth daily.     . risperiDONE (RISPERDAL) 0.5 MG tablet Take 0.5 mg by mouth 2 (two) times daily.    . TEKTURNA 300 MG tablet Take 150 mg by mouth daily.   2  . traMADol (ULTRAM) 50 MG tablet Take 50 mg by mouth 4 (four) times daily as needed for pain.    . valACYclovir (VALTREX) 1000 MG tablet Take 1 g by mouth 3 (three) times daily as needed (cold sores).     . Fluticasone-Umeclidin-Vilant (TRELEGY ELLIPTA) 100-62.5-25 MCG/INH AEPB Inhale 1 puff into the lungs daily. RINSE  MOUTH AFTER USE (Patient not taking: Reported on 09/27/2016) 1 each 0    Musculoskeletal: Strength & Muscle Tone: within normal limits Gait & Station: normal Patient leans: N/A  Psychiatric Specialty Exam: Physical Exam  Constitutional: She appears well-developed and well-nourished.  HENT:  Head: Normocephalic.  Neck: Normal range of motion.  Respiratory: Effort normal.  Musculoskeletal: Normal range of motion.  Neurological: She is alert.  Psychiatric: Thought content normal. Her mood appears anxious. Her affect is labile. Her speech is tangential. She is actively hallucinating. Cognition and memory are impaired. She expresses impulsivity.    Review of Systems  Psychiatric/Behavioral: Positive for hallucinations and memory loss. The patient is nervous/anxious.   All other systems reviewed and are negative.   Blood pressure 149/69, pulse 94, temperature 97.8 F (36.6 C), temperature source Oral, resp. rate 18, SpO2 96 %.There is no height or weight on file to calculate BMI.  General Appearance: Disheveled  Eye Contact:  Fair  Speech:  Normal Rate  Volume:  Normal  Mood:  Anxious  Affect:  Congruent  Thought Process:  Disorganized and Descriptions of Associations: Tangential  Orientation:  Other:  person  Thought Content:  Hallucinations: Auditory Visual  Suicidal Thoughts:  No  Homicidal Thoughts:  No  Memory:  Immediate;   Poor Recent;   Poor Remote;  Poor  Judgement:  Impaired  Insight:  Lacking  Psychomotor Activity:  Increased  Concentration:  Concentration: Poor and Attention Span: Poor  Recall:  Poor  Fund of Knowledge:  Fair  Language:  Good  Akathisia:  No  Handed:  Right  AIMS (if indicated):     Assets:  Housing Leisure Time Physical Health Resilience Social Support  ADL's:  Intact  Cognition:  Impaired,  Moderate  Sleep:        Treatment Plan Summary: Daily contact with patient to assess and evaluate symptoms and progress in treatment,  Medication management and Plan psychosis, acute related to medical infection:  -Crisis stabilization -Medication management:   Restarted medical medications including treatment for her UTI.   Continued her Risperdal 0.5 mg BID for psychosis   Increased her Depakote 250 mg BID to 500 mg BID for mood stabilization -Individual counseling  Disposition: Recommend psychiatric Inpatient admission when medically cleared.  Waylan Boga, NP 09/28/2016 1:26 PM   Reviewed the information documented and agree with the treatment plan.  Kaylanni Ezelle 09/28/2016 2:28 PM

## 2016-09-28 NOTE — Progress Notes (Signed)
CSW contacted [name] to inquire about bed availability, staff reported that the facility was at capacity and that there would be no bed availability prior to Monday.

## 2016-09-28 NOTE — ED Notes (Signed)
Patient walking out to nurse's station and asking nurse if this trip stops in [place].  Reoriented patient to her surroundings and tech started showing her some pictures of her grandchildren to redirect her.  Patient calm and walked back into her room.

## 2016-09-28 NOTE — ED Notes (Signed)
Pt. Had one gold top tube drawn due to pt acting out and taking off clothes. Nurses aware.

## 2016-09-28 NOTE — ED Notes (Addendum)
Patient became tearful when nurse was giving her meds.  She asked where her family was and nurse reminded patient that they were here visiting this morning.  Patient had no memory of their visit.  Called patient's husband on the phone and let him talk to her.

## 2016-09-28 NOTE — Progress Notes (Signed)
CSW contacted [facility] to inquire about bed availability. Staff reported that the facility will have bed availability tomorrow.

## 2016-09-29 DIAGNOSIS — N390 Urinary tract infection, site not specified: Secondary | ICD-10-CM | POA: Diagnosis not present

## 2016-09-29 DIAGNOSIS — R419 Unspecified symptoms and signs involving cognitive functions and awareness: Secondary | ICD-10-CM | POA: Diagnosis not present

## 2016-09-29 DIAGNOSIS — S51019A Laceration without foreign body of unspecified elbow, initial encounter: Secondary | ICD-10-CM | POA: Diagnosis not present

## 2016-09-29 DIAGNOSIS — Z8673 Personal history of transient ischemic attack (TIA), and cerebral infarction without residual deficits: Secondary | ICD-10-CM | POA: Diagnosis not present

## 2016-09-29 DIAGNOSIS — R930 Abnormal findings on diagnostic imaging of skull and head, not elsewhere classified: Secondary | ICD-10-CM | POA: Diagnosis not present

## 2016-09-29 DIAGNOSIS — F23 Brief psychotic disorder: Secondary | ICD-10-CM | POA: Diagnosis not present

## 2016-09-29 DIAGNOSIS — F068 Other specified mental disorders due to known physiological condition: Secondary | ICD-10-CM | POA: Diagnosis not present

## 2016-09-29 DIAGNOSIS — J45909 Unspecified asthma, uncomplicated: Secondary | ICD-10-CM | POA: Diagnosis not present

## 2016-09-29 DIAGNOSIS — Z9183 Wandering in diseases classified elsewhere: Secondary | ICD-10-CM | POA: Diagnosis not present

## 2016-09-29 DIAGNOSIS — I10 Essential (primary) hypertension: Secondary | ICD-10-CM | POA: Diagnosis not present

## 2016-09-29 DIAGNOSIS — Z803 Family history of malignant neoplasm of breast: Secondary | ICD-10-CM | POA: Diagnosis not present

## 2016-09-29 DIAGNOSIS — G309 Alzheimer's disease, unspecified: Secondary | ICD-10-CM | POA: Diagnosis present

## 2016-09-29 DIAGNOSIS — Z87891 Personal history of nicotine dependence: Secondary | ICD-10-CM | POA: Diagnosis not present

## 2016-09-29 DIAGNOSIS — Z043 Encounter for examination and observation following other accident: Secondary | ICD-10-CM | POA: Diagnosis not present

## 2016-09-29 DIAGNOSIS — J111 Influenza due to unidentified influenza virus with other respiratory manifestations: Secondary | ICD-10-CM | POA: Diagnosis not present

## 2016-09-29 DIAGNOSIS — G301 Alzheimer's disease with late onset: Secondary | ICD-10-CM | POA: Diagnosis not present

## 2016-09-29 DIAGNOSIS — Z9889 Other specified postprocedural states: Secondary | ICD-10-CM | POA: Diagnosis not present

## 2016-09-29 DIAGNOSIS — F22 Delusional disorders: Secondary | ICD-10-CM | POA: Diagnosis present

## 2016-09-29 DIAGNOSIS — Z85828 Personal history of other malignant neoplasm of skin: Secondary | ICD-10-CM | POA: Diagnosis not present

## 2016-09-29 DIAGNOSIS — Z8489 Family history of other specified conditions: Secondary | ICD-10-CM | POA: Diagnosis not present

## 2016-09-29 DIAGNOSIS — S300XXA Contusion of lower back and pelvis, initial encounter: Secondary | ICD-10-CM | POA: Diagnosis not present

## 2016-09-29 DIAGNOSIS — F0281 Dementia in other diseases classified elsewhere with behavioral disturbance: Secondary | ICD-10-CM | POA: Diagnosis present

## 2016-09-29 DIAGNOSIS — Z79899 Other long term (current) drug therapy: Secondary | ICD-10-CM | POA: Diagnosis not present

## 2016-09-29 LAB — T4, FREE: Free T4: 1.04 ng/dL (ref 0.61–1.12)

## 2016-09-29 NOTE — Progress Notes (Signed)
CSW contacted [facility]. Staff confirmed IVC received. Staff reported that patient does not appear acute enough to receive bed offer.

## 2016-09-29 NOTE — Progress Notes (Signed)
CSW contacted by [employer] member [place]. Staff requested patient's IVC paperwork. Staff provided fax number [phone]. CSW faxed patient's IVC paperwork to [facility].

## 2016-09-29 NOTE — Consult Note (Signed)
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 Urinary tract infection [N39.0] 09/28/2016    Priority: High  . 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 Essential hypertension [I10] 07/15/2016  . TIA (transient ischemic attack) [G45.9]   . Expressive aphasia [R47.01] 07/14/2016  . Cerebral amyloid angiopathy (CODE) [I68.0] 06/20/2016  . Memory difficulty [R41.3] 05/14/2016  . Headache disorder [R51] 12/13/2014  . Chest wall pain [R07.89] 05/24/2012  . Other general symptoms  [R68.89] 12/12/2011  . Iron deficiency anemia, unspecified  [D50.9] 12/12/2011  . Diverticulosis of colon (without mention of hemorrhage) [K57.30] 12/06/2011  . History of IBS [Z87.19] 12/06/2011  . Calcium oxalate kidney stones [N20.0] 12/06/2011  . S/P cholecystectomy [Z90.49] 12/06/2011  . Bilateral lower abdominal pain [R10.31, R10.32] 12/06/2011  . Right ureteral stone [N20.1] 07/22/2011  . Hyperparathyroidism, primary ([place]) [E21.0] 04/22/2011  . RHINOSINUSITIS, ACUTE [J01.80] 09/04/2007  . ASTHMATIC BRONCHITIS, ACUTE [J20.9] 09/03/2007  . Seasonal and perennial allergic rhinitis [J30.89, J30.2] 09/03/2007    Total Time spent with patient: 30 minutes  Subjective:   Ashley [name] is a 76 y.o. female patient admitted with psychosis.  HPI:   On admission:  76 yo female who was brought to the ED with psychosis, urinary tract infection noted and being treated.  The patient was confused and wandering in the ED.  Her family is concerned that her psychosis was acute onset, discussed with them at length.  She has chronic pain related to frequent kidney stones.  Her behaviors started abruptly in the  past couple of weeks.  They took her to a psychiatrist who started Depakote but her hallucinations continued so he started Risperdal but has had only had two doses along with Ativan but only a few doses.  The patient did not improve so they brought her here.  No suicidal/homicidal ideations or alcohol/drug abuse.  Patient received a bed at [facility] yesterday but the family did not want her to go that far.  The policy explained about first bed availability and compromised with them to give another placement 24 hours.  Today, the family who does not want to take her home until she is more stable was told we would be looking for a bed anywhere as the ED is not the best place for her psychiatric care but a geriatric facility.  The husband and daughter then wanted to demand a geriatric hospital beside a "medical hospital" due to her kidney stone.  Explained to the family that her surgery was not emergent and she would not need to transfer from psychiatry to surgery.  First bed availability is what we have to go by but will consider their wishes but cannot delay care any longer.  Patient remains confused at times, stating she has a sister and does not.   Orientation needed at times but has improved since yesterday.  Past Psychiatric History: none  Risk to Self: Suicidal Ideation: No Suicidal Intent: No Is patient at risk for suicide?: No Suicidal Plan?: No Access to Means: No What has been your use of drugs/alcohol within the last 12 months?: Denies How many times?: 0 Other Self Harm Risks: na Triggers for Past Attempts:  (na) Intentional Self  Injurious Behavior: None Risk to Others: Homicidal Ideation: No Thoughts of Harm to Others: No Current Homicidal Intent: No Current Homicidal Plan: No Access to Homicidal Means: No Identified Victim: na History of harm to others?: No Assessment of Violence: None Noted Violent Behavior Description: na Does patient have access to weapons?:  No Criminal Charges Pending?: No Does patient have a court date: No Prior Inpatient Therapy: Prior Inpatient Therapy: No Prior Therapy Dates: na Prior Therapy Facilty/Provider(s): na Reason for Treatment: na Prior Outpatient Therapy: Prior Outpatient Therapy: No Prior Therapy Dates: na Prior Therapy Facilty/Provider(s): na Reason for Treatment: na Does patient have an ACCT team?: No Does patient have Intensive In-House Services?  : No Does patient have [facility] services? : No Does patient have P4CC services?: No  Past Medical History:  Past Medical History:  Diagnosis Date  . Arthritis    back and neck  . Carotid artery stenosis   . Cerebral amyloid angiopathy (CODE) 06/20/2016  . Chronic rhinosinusitis   . DDD (degenerative disc disease)   . Depression   . Diverticulosis of colon   . Frequency of urination   . Glaucoma   . Headache disorder 12/13/2014   Right temple  . Hearing loss   . History of kidney stones    MULTIBLE SURGICAL INTERVENTIONS  . History of skin cancer HX TOPICAL FACIAL [place] 2013  . History of TIA (transient ischemic attack)    NOV 2013-- NO RESIDUAL  . Hypertension   . IBS (irritable bowel syndrome)   . Memory difficulty 05/14/2016  . Memory loss   . Mild asthma    COLD INDUCED  . Right ureteral stone   . Seasonal and perennial allergic rhinitis   . Shingles    EYEBALL 09-17-2013  . Spondylosis     Past Surgical History:  Procedure Laterality Date  . BUNIONECTOMY     BILATERAL  . CALDWELL LUC     sinus drainage procedure  . CYSTO/ BILATERAL URETEROSCOPIC STONE EXTRACTIONS  02-09-2003  . CYSTO/ BILATERAL URETEROSCOPY/  LASER OF STONES LEFT SIDE  06-02-2007  . CYSTOSCOPY/RETROGRADE/URETEROSCOPY/STONE EXTRACTION WITH BASKET  03/31/2012   Procedure: CYSTOSCOPY/RETROGRADE/URETEROSCOPY/STONE EXTRACTION WITH BASKET;  Surgeon: Malka So, MD;  [facility];  Service: Urology;  Laterality: Right;   .  CYSTOSCOPY/RETROGRADE/URETEROSCOPY/STONE EXTRACTION WITH BASKET Right 03/29/2013   Procedure: CYSTOSCOPY//URETEROSCOPY/STONE EXTRACTION WITH BASKET;  Surgeon: Malka So, MD;  Location: [facility];  Service: Urology;  Laterality: Right;  . CYSTOSCOPY/RETROGRADE/URETEROSCOPY/STONE EXTRACTION WITH BASKET Right 10/14/2013   Procedure: CYSTOSCOPY/RETROGRADE/URETEROSCOPY/STONE EXTRACTION WITH BASKET;  Surgeon: Irine Seal, MD;  Location[facility];  Service: Urology;  Laterality: Right;  . ESOPHAGOGASTRODUODENOSCOPY (EGD) WITH PROPOFOL N/A 04/12/2015   Procedure: ESOPHAGOGASTRODUODENOSCOPY (EGD) WITH PROPOFOL;  Surgeon: Arta Silence, MD;  Location: [facility];  Service: Endoscopy;  Laterality: N/A;  . EUS N/A 04/12/2015   Procedure: ESOPHAGEAL ENDOSCOPIC ULTRASOUND (EUS) RADIAL;  Surgeon: Arta Silence, MD;  Location: [facility];  Service: Endoscopy;  Laterality: N/A;  . EXTRACORPOREAL SHOCK WAVE LITHOTRIPSY  about [id number] Dr Reece Agar   Never completed procedure-could not tolerate pain. Had seizures due to pain  . EYE SURGERY  AGE 89   INJURY  . HERNIA REPAIR  AGE 22  . HOLMIUM LASER APPLICATION Right [id number]   Procedure: HOLMIUM LASER APPLICATION;  Surgeon: Irine Seal, MD;  Location: [facility];  Service: Urology;  Laterality: Right;  . LAPAROSCOPIC CHOLECYSTECTOMY  01-12-2005  . LEFT URETEROSCOPIC STONE EXTRACTION    LAST  ONE 04-28-2009   SEVERAL SURGICAL STONE EXTRACTIONS  . LUMBAR LAMINECTOMY  09-30-2002   L4 - 5;  SPONDYLOLISTHISES W/ STENOSIS AND RADICULOPATHY  . PERCUTANEOUS NEPHROSTOLITHOTOMY  1975  . RIGHT URETEROSCOPIC STONE EXTRACTION  LAST ONE 02-21-2011   MULTIPLE SURGICAL STONE EXTRACTIONS  . TONSILECTOMY, ADENOIDECTOMY, BILATERAL MYRINGOTOMY AND TUBES  CHILD  . VAGINAL HYSTERECTOMY  1991   W/ BILATERAL SALPINGOOPHECTOMY   Family History:  Family History  Problem Relation Age of Onset  . COPD Mother   . Breast cancer Mother   . Colon cancer  Neg Hx   . Migraines Neg Hx    Family Psychiatric  History: none Social History:  History  Alcohol Use  . Yes    Comment: social - weekend     History  Drug Use No    Social History   Social History  . Marital status: Married    Spouse name: N/A  . Number of children: 2  . Years of education: masters   Occupational History  . travel coordinator/travel agent     owner   Social History Main Topics  . Smoking status: Former Smoker    Years: 23.00    Types: Cigarettes    Quit date: 09/16/1985  . Smokeless tobacco: Never Used  . Alcohol use Yes     Comment: social - weekend  . Drug use: No  . Sexual activity: Not Asked   Other Topics Concern  . None   Social History Narrative   Daily caffeine-1 cup    Patient is right handed.   Lives at home with husband.   Additional Social History:    Allergies:  No Known Allergies  Labs:  Results for orders placed or performed during the hospital encounter of 09/27/16 (from the past 48 hour(s))  TSH     Status: None   Collection Time: 09/28/16  5:40 PM  Result Value Ref Range   TSH 0.832 0.350 - 4.500 uIU/mL    Comment: Performed by a 3rd Generation assay with a functional sensitivity of <=0.01 uIU/mL.  T4, free     Status: None   Collection Time: 09/28/16  5:40 PM  Result Value Ref Range   Free T4 1.04 0.61 - 1.12 ng/dL    Comment: (NOTE) Biotin ingestion may interfere with free T4 tests. If the results are inconsistent with the TSH level, previous test results, or the clinical presentation, then consider biotin interference. If needed, order repeat testing after stopping biotin. Performed at [facility]     Current Facility-Administered Medications  Medication Dose Route Frequency Provider Last Rate Last Dose  . aliskiren (TEKTURNA) tablet 150 mg  150 mg Oral Daily Patrecia Pour, NP   150 mg at 09/29/16 0931  . cephALEXin (KEFLEX) capsule 500 mg  500 mg Oral Q8H Patrecia Pour, NP   500 mg at 09/29/16  0931  . divalproex (DEPAKOTE ER) 24 hr tablet 500 mg  500 mg Oral BID Patrecia Pour, NP   500 mg at 09/29/16 0931  . docusate sodium (COLACE) capsule 100 mg  100 mg Oral Daily PRN Patrecia Pour, NP      . multivitamin liquid   Oral Daily Patrecia Pour, NP   236 mL at 09/29/16 0931  . pravastatin (PRAVACHOL) tablet 40 mg  40 mg Oral QPM Patrecia Pour, NP   40 mg at 09/28/16 1812  . risperiDONE (RISPERDAL) tablet 0.5 mg  0.5 mg Oral BID Patrecia Pour,  NP   0.5 mg at 09/29/16 [id number]   Current Outpatient Prescriptions  Medication Sig Dispense Refill  . benzonatate (TESSALON) 200 MG capsule Take 1 capsule (200 mg total) by mouth 3 (three) times daily as needed for cough. 30 capsule 1  . desipramine (NOPRAMIN) 10 MG tablet Take 20 mg by mouth at bedtime.     . divalproex (DEPAKOTE ER) 250 MG 24 hr tablet Take 250 mg by mouth 2 (two) times daily.    Marland Kitchen docusate sodium (COLACE) 100 MG capsule Take 100 mg by mouth daily as needed for mild constipation or moderate constipation.    Marland Kitchen estradiol (ESTRACE) 1 MG tablet Take 1 mg by mouth daily.    . fish oil-omega-3 fatty acids 1000 MG capsule Take 2 g by mouth daily.     Marland Kitchen LORazepam (ATIVAN) 1 MG tablet Take 1 mg by mouth daily.    . Multiple Vitamin (MULTIVITAMIN PO) Take 1 tablet by mouth daily.     . pravastatin (PRAVACHOL) 40 MG tablet Take 40 mg by mouth daily.     . risperiDONE (RISPERDAL) 0.5 MG tablet Take 0.5 mg by mouth 2 (two) times daily.    . TEKTURNA 300 MG tablet Take 150 mg by mouth daily.   2  . traMADol (ULTRAM) 50 MG tablet Take 50 mg by mouth 4 (four) times daily as needed for pain.    . valACYclovir (VALTREX) 1000 MG tablet Take 1 g by mouth 3 (three) times daily as needed (cold sores).     . Fluticasone-Umeclidin-Vilant (TRELEGY ELLIPTA) 100-62.5-25 MCG/INH AEPB Inhale 1 puff into the lungs daily. RINSE MOUTH AFTER USE (Patient not taking: Reported on 09/27/2016) 1 each 0    Musculoskeletal: Strength & Muscle Tone: within normal  limits Gait & Station: normal Patient leans: N/A  Psychiatric Specialty Exam: Physical Exam  Constitutional: She appears well-developed and well-nourished.  HENT:  Head: Normocephalic.  Neck: Normal range of motion.  Respiratory: Effort normal.  Musculoskeletal: Normal range of motion.  Neurological: She is alert.  Psychiatric: Her speech is normal. Thought content normal. Her mood appears anxious. Her affect is labile. She is actively hallucinating. Cognition and memory are impaired. She expresses impulsivity.    Review of Systems  Psychiatric/Behavioral: Positive for hallucinations and memory loss. The patient is nervous/anxious.   All other systems reviewed and are negative.   Blood pressure 157/74, pulse 106, temperature 98.4 F (36.9 C), temperature source Oral, resp. rate 16, SpO2 98 %.There is no height or weight on file to calculate BMI.  General Appearance: Disheveled  Eye Contact:  Fair  Speech:  Normal Rate  Volume:  Normal  Mood:  Anxious  Affect:  Congruent  Thought Process:  Confusion at times  Orientation:  Other:  person  Thought Content:  Hallucinations: Auditory Visual improving  Suicidal Thoughts:  No  Homicidal Thoughts:  No  Memory:  Immediate;   Poor Recent;   Poor Remote;   Poor  Judgement:  Impaired  Insight:  Lacking  Psychomotor Activity:  Increased  Concentration:  Concentration: Poor and Attention Span: Poor  Recall:  Poor  Fund of Knowledge:  Fair  Language:  Good  Akathisia:  No  Handed:  Right  AIMS (if indicated):     Assets:  Housing Leisure Time Physical Health Resilience Social Support  ADL's:  Intact  Cognition:  Impaired,  Moderate  Sleep:        Treatment Plan Summary: Daily contact with patient to assess and evaluate  symptoms and progress in treatment, Medication management and Plan psychosis, acute related to medical infection:  -Crisis stabilization -Medication management:   Restarted medical medications including  treatment for her UTI.   Continued her Risperdal 0.5 mg BID for psychosis, and Depakote 500 mg BID for mood stabilization -Individual counseling  Disposition: Recommend psychiatric Inpatient admission when medically cleared.  Waylan Boga, NP 09/29/2016 12:37 PM   Patient seen for this face to face evaluation, case discussed with patient family, treatment team and physician extender. Reviewed the information documented and agree with the treatment plan.  Jessie Cowher 09/29/2016 1:08 PM

## 2016-09-29 NOTE — Progress Notes (Signed)
CSW contacted by [facility]. Staff reported that patient has been accepted by Dr. Lucilla Edin and the number to call report is [phone].   CSW notified patient's husband and patient's daughter that patient received bed offer. CSW suggested that family contact facility to inquire about questions they may have.   CSW notified patient's EDP about bed offer. CSW notified patient's RN about bed offer and that transportation needed to be arranged for patient.

## 2016-09-29 NOTE — Progress Notes (Signed)
CSW faxed patient's referral to [facility].

## 2016-09-29 NOTE — Progress Notes (Signed)
CSW faxed patient referral to: [facility], [facility], [facility], [facility], [facility] and [facility] contacted [facility] and inquired about patient's referral. CSW spoke with staff named Quintin Alto, Quintin Alto confirmed that the referral was received and that they were at capacity today and that they will have discharges throughout the week. CSW contacted [facility] to inquire about patient's referral, staff reported they were at capacity.

## 2016-09-29 NOTE — ED Notes (Signed)
Patient ambulatory to restroom  ?

## 2016-09-29 NOTE — ED Notes (Signed)
Pt states she is waiting for her "Dad" to visit.  Father is deceased.

## 2016-09-29 NOTE — Progress Notes (Signed)
CSW contacted [facility] and staff reported that they were at capacity. CSW contacted [facility] and was unable to reach anyone on the phone. CSW contacted [facility] to inquire about patient's referral, CSW left message requesting return phone call.   CSW spoke with patient's husband and patient's daughter in the hallway. Patient's husband and daughter expressed concern about referral process and finding patient a placement with a medical center attached. CSW explained referral process and referrals and contacts made thus far for patient. Patient's husband and patient's daughter requested to be notified about patient's bed offers Jenny Reichmann [phone]; Ander Purpura [phone]).   CSW contacted patient's husband to provide update, patient's daughter informed CSW that she was also on the line. CSW provided update about bed availability for patient. Patient's husband and patient's daughter informed CSW that they wanted to be notified when patient receives bed offer.

## 2016-09-30 NOTE — BHH Counselor (Addendum)
[facility] requesting a copy of the medical POA papers. Writer faxed a copy of POA which was found in patient's chart.

## 2016-10-02 ENCOUNTER — Ambulatory Visit: Payer: Self-pay | Admitting: Neurology

## 2016-10-04 ENCOUNTER — Telehealth: Payer: Self-pay

## 2016-10-04 NOTE — Telephone Encounter (Signed)
I called pt to r/s her appt for 10/02/16 due to weather. I spoke to pt's husband. Pt's husband is asking that Dr. Brett Fairy call him as soon as possible at [phone] because pt has been admitted to the hospital. I passed this message on to Dr. Brett Fairy.

## 2016-10-04 NOTE — Telephone Encounter (Signed)
I called pt's husband to reschedule pt's appt. Pt's husband is agreeable to 10/29/16 at 3:00pm. (last of the day.) Pt's husband verbalized understanding of new appt, date and time.

## 2016-10-28 DIAGNOSIS — M150 Primary generalized (osteo)arthritis: Secondary | ICD-10-CM | POA: Diagnosis not present

## 2016-10-28 DIAGNOSIS — R488 Other symbolic dysfunctions: Secondary | ICD-10-CM | POA: Diagnosis not present

## 2016-10-28 DIAGNOSIS — F0150 Vascular dementia without behavioral disturbance: Secondary | ICD-10-CM | POA: Diagnosis not present

## 2016-10-28 DIAGNOSIS — F0390 Unspecified dementia without behavioral disturbance: Secondary | ICD-10-CM | POA: Diagnosis not present

## 2016-10-28 DIAGNOSIS — F0391 Unspecified dementia with behavioral disturbance: Secondary | ICD-10-CM | POA: Diagnosis not present

## 2016-10-28 DIAGNOSIS — R279 Unspecified lack of coordination: Secondary | ICD-10-CM | POA: Diagnosis not present

## 2016-10-29 ENCOUNTER — Ambulatory Visit (INDEPENDENT_AMBULATORY_CARE_PROVIDER_SITE_OTHER): Payer: Medicare Other | Admitting: Neurology

## 2016-10-29 ENCOUNTER — Encounter: Payer: Self-pay | Admitting: Neurology

## 2016-10-29 VITALS — BP 114/60 | HR 86 | Resp 16 | Ht 61.0 in | Wt 122.0 lb

## 2016-10-29 DIAGNOSIS — F0150 Vascular dementia without behavioral disturbance: Secondary | ICD-10-CM | POA: Diagnosis not present

## 2016-10-29 DIAGNOSIS — R4701 Aphasia: Secondary | ICD-10-CM

## 2016-10-29 DIAGNOSIS — M150 Primary generalized (osteo)arthritis: Secondary | ICD-10-CM | POA: Diagnosis not present

## 2016-10-29 DIAGNOSIS — R279 Unspecified lack of coordination: Secondary | ICD-10-CM | POA: Diagnosis not present

## 2016-10-29 DIAGNOSIS — F0390 Unspecified dementia without behavioral disturbance: Secondary | ICD-10-CM | POA: Diagnosis not present

## 2016-10-29 DIAGNOSIS — F09 Unspecified mental disorder due to known physiological condition: Secondary | ICD-10-CM | POA: Diagnosis not present

## 2016-10-29 DIAGNOSIS — F0391 Unspecified dementia with behavioral disturbance: Secondary | ICD-10-CM | POA: Diagnosis not present

## 2016-10-29 DIAGNOSIS — R488 Other symbolic dysfunctions: Secondary | ICD-10-CM | POA: Diagnosis not present

## 2016-10-29 MED ORDER — TRAZODONE HCL 50 MG PO TABS: 50.0000 mg | ORAL_TABLET | Freq: Every evening | ORAL | 1 refills | Status: DC | PRN

## 2016-10-29 NOTE — Patient Instructions (Signed)
Alzheimer Disease Caregiver Guide A person who has Alzheimer disease may not be able to take care of himself or herself. He or she may need help with simple tasks. The tips below can help you care for the person. Memory loss and confusion If the person is confused or cannot remember things:  Stay calm.  Respond with a short answer.  Avoid correcting him or her in a way that sounds like scolding.  Try not to take it personally, even if he or she forgets your name.  Behavior changes The person may go through behavior changes. This can include depression, anxiety, anger, or seeing things that are not there. When behavior changes:  Try not to take behavior changes personally.  Stay calm and patient.  Do not argue or try to convince the person about a specific point.  Know that these changes are part of the disease process. Try to work through it.  Tips to lessen frustration  Make appointments and do daily tasks when the person is at his or her best.  Take your time. Simple tasks may take longer. Allow plenty of time to complete tasks.  Limit choices for the person.  Involve the person in what you are doing.  Stick to a routine.  Avoid new or crowded places, if possible.  Use simple words, short sentences, and a calm voice. Only give 1 direction at a time.  Buy clothes and shoes that are easy to put on and take off.  Let people help if they offer. Home safety  Keep floors clear. Remove rugs, magazine racks, and floor lamps.  Keep hallways well lit.  Put a handrail and nonslip mat in the bathtub or shower.  Put childproof locks on cabinets that have dangerous items in them. These items include medicine, alcohol, guns, toxic cleaning items, sharp tools, matches, or lighters.  Place locks on doors where the person cannot see or reach them. This helps the person to not wander out of the house and get lost.  Be prepared for emergencies. Keep a list of emergency phone  numbers and addresses in a handy area. Plans for the future  Talk about finances. ? Talk about money management. People with Alzheimer disease have trouble managing their money as the disease gets worse. ? Get help from professional advisors about financial and legal matters.  Talk about future care. ? Choose a power of attorney. This is someone who can make decisions for the person with Alzheimer disease when he or she can no longer do so. ? Talk about driving and when it is the right time to stop. The person's doctor can help with this. ? If the person lives alone, make sure he or she is safe. Some people need extra help at home. Other people need more care at a nursing home or care center. Support groups Some benefits of joining a support group include:  Learning ways to manage stress.  Sharing experiences with others.  Getting emotional comfort and support.  Learning new caregiving skills as the disease progresses.  Knowing what community resources are available and taking advantage of them.  Get help if:  The person has a fever.  The person has a sudden behavior change that does not get better with calming strategies.  The person is unable to manage his or her living situation.  The person threatens you or anyone else, including himself or herself.  You are no longer able to care for the person. This information is not   intended to replace advice given to you by your health care provider. Make sure you discuss any questions you have with your health care provider. Document Released: 11/25/2011 Document Revised: 02/08/2016 Document Reviewed: 10/23/2011 Elsevier Interactive Patient Education  2017 [employer]  

## 2016-10-29 NOTE — Addendum Note (Signed)
Addended by: Laurence Spates on: 10/29/2016 04:52 PM   Modules accepted: Orders

## 2016-10-29 NOTE — Progress Notes (Signed)
Provider:  Larey Seat, M D  Referring Provider: Mayra Neer, MD Primary Care Physician:  Mayra Neer, MD  Chief Complaint  Patient presents with  . New Patient (Initial Visit)    Rm 10. Patient is changing from Dr. Benson Norway. Lives at the [facility]    Mr. and Mrs. Zayed are accompanied by their adult daughter , Ander Purpura. The son, Randall Hiss, could not be here.  HPI:  Ashley [name] is a 76 y.o. female  Is seen here as a referral  from Dr. Brigitte Pulse  transfer of care from Dr. Jannifer Franklin.   Marison Mccallen has been followed at [facility] for at least 3-1/2 years, after a TIA she suffered in 2013. She also had presented with headaches, and has been followed by my colleague Dr. Jannifer Franklin. Dr. Jannifer Franklin ordered serial MRIs after memory loss became more and more evident and I can quote the MRI, EEG and visit reports.  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 She grew up in [place] near [place]. She is of [place] descend. 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 The time of her initial TIA she had worked very hard in many hours a week, attributing some of her medical problems to the stresses and time dedicated to work. She had started a travel agency in 1996, the couple had left in many places over the [place], [place] of [place], [place], [place] and now [place] for the last 28 years. She no longer can travel , is too confused. In the meantime she has reached an MMSE of only 15/30 .   Mrs. Vise is confused and very fluently confabulation, had paranoid hallucination  Her husband and daughter are here and gently correct her reports.   An MRI study of the brain from 06/04/2016 was compared to a previous study from 12/28/2014  showed multiple cortical and subcortical microhemorrhages consistent with amyloid angiopathy- . There was no definite change seen in comparison to the prior imaging study but rather chronic microvascular changes.  Dr. Felecia Shelling interpreted the study. An EEG had been ordered by Dr. Hal Hope  a [place]- list. and was performed on 07/15/2016:  it was slow over the right temporal region suggestive of an underlying structural or physiologic abnormality.     Review of Systems: Out of a complete 14 system review, the patient complains of only the following symptoms, and all other reviewed systems are negative.  Change in personality hallucinations, confabulations, memory loss confusion. Social History   Social History  . Marital status: Married    Spouse name: N/A  . Number of children: 2  . Years of education: masters   Occupational History  . travel coordinator/travel agent     owner   Social History Main Topics  . Smoking status: Former Smoker    Years: 23.00    Types: Cigarettes    Quit date: 09/16/1985  . Smokeless tobacco: Never Used  . Alcohol use Yes     Comment: social - weekend  . Drug use: No  . Sexual activity: Not on file   Other Topics Concern  . Not on file   Social History Narrative   Daily caffeine-1 cup    Patient is right handed.  Lives at home with husband.    Family History  Problem Relation Age of Onset  . COPD Mother   . Breast cancer Mother   . Colon cancer Neg Hx   . Migraines Neg Hx     Past Medical History:  Diagnosis Date  . Arthritis    back and neck  . Carotid artery stenosis   . Cerebral amyloid angiopathy (CODE) 06/20/2016  . Chronic rhinosinusitis   . DDD (degenerative disc disease)   . Depression   . Diverticulosis of colon   . Frequency of urination   . Glaucoma   . Headache disorder 12/13/2014   Right temple  . Hearing loss   . History of kidney stones    MULTIBLE SURGICAL INTERVENTIONS  . History of skin cancer HX  TOPICAL FACIAL [place] 2013  . History of TIA (transient ischemic attack)    NOV 2013-- NO RESIDUAL  . Hypertension   . IBS (irritable bowel syndrome)   . Memory difficulty 05/14/2016  . Memory loss   . Mild asthma    COLD INDUCED  . Right ureteral stone   . Seasonal and perennial allergic rhinitis   . Shingles    EYEBALL 09-17-2013  . Spondylosis     Past Surgical History:  Procedure Laterality Date  . BUNIONECTOMY     BILATERAL  . CALDWELL LUC     sinus drainage procedure  . CYSTO/ BILATERAL URETEROSCOPIC STONE EXTRACTIONS  02-09-2003  . CYSTO/ BILATERAL URETEROSCOPY/  LASER OF STONES LEFT SIDE  06-02-2007  . CYSTOSCOPY/RETROGRADE/URETEROSCOPY/STONE EXTRACTION WITH BASKET  03/31/2012   Procedure: CYSTOSCOPY/RETROGRADE/URETEROSCOPY/STONE EXTRACTION WITH BASKET;  Surgeon: Malka So, MD;  [facility];  Service: Urology;  Laterality: Right;   . CYSTOSCOPY/RETROGRADE/URETEROSCOPY/STONE EXTRACTION WITH BASKET Right 03/29/2013   Procedure: CYSTOSCOPY//URETEROSCOPY/STONE EXTRACTION WITH BASKET;  Surgeon: Malka So, MD;  Location: [facility];  Service: Urology;  Laterality: Right;  . CYSTOSCOPY/RETROGRADE/URETEROSCOPY/STONE EXTRACTION WITH BASKET Right 10/14/2013   Procedure: CYSTOSCOPY/RETROGRADE/URETEROSCOPY/STONE EXTRACTION WITH BASKET;  Surgeon: Irine Seal, MD;  Location: [facility];  Service: Urology;  Laterality: Right;  . ESOPHAGOGASTRODUODENOSCOPY (EGD) WITH PROPOFOL N/A 04/12/2015   Procedure: ESOPHAGOGASTRODUODENOSCOPY (EGD) WITH PROPOFOL;  Surgeon: Arta Silence, MD;  Location: [facility];  Service: Endoscopy;  Laterality: N/A;  . EUS N/A 04/12/2015   Procedure: ESOPHAGEAL ENDOSCOPIC ULTRASOUND (EUS) RADIAL;  Surgeon: Arta Silence, MD;  Location: [facility];  Service: Endoscopy;  Laterality: N/A;  . EXTRACORPOREAL SHOCK WAVE LITHOTRIPSY  about [id number] Dr Reece Agar   Never completed procedure-could not tolerate pain. Had seizures due to  pain  . EYE SURGERY  AGE 89   INJURY  . HERNIA REPAIR  AGE 14  . HOLMIUM LASER APPLICATION Right [id number]   Procedure: HOLMIUM LASER APPLICATION;  Surgeon: Irine Seal, MD;  Location: [facility];  Service: Urology;  Laterality: Right;  . LAPAROSCOPIC CHOLECYSTECTOMY  01-12-2005  . LEFT URETEROSCOPIC STONE EXTRACTION    LAST ONE 04-28-2009   SEVERAL SURGICAL STONE EXTRACTIONS  . LUMBAR LAMINECTOMY  09-30-2002   L4 - 5;  SPONDYLOLISTHISES W/ STENOSIS AND RADICULOPATHY  . PERCUTANEOUS NEPHROSTOLITHOTOMY  1975  . RIGHT URETEROSCOPIC STONE EXTRACTION  LAST ONE 02-21-2011   MULTIPLE SURGICAL STONE EXTRACTIONS  . TONSILECTOMY, ADENOIDECTOMY, BILATERAL MYRINGOTOMY AND TUBES  CHILD  . VAGINAL HYSTERECTOMY  1991   W/ BILATERAL SALPINGOOPHECTOMY    Current Outpatient Prescriptions  Medication Sig Dispense Refill  . benzonatate (TESSALON) 200 MG capsule Take 1 capsule (200 mg total) by  mouth 3 (three) times daily as needed for cough. 30 capsule 1  . desipramine (NOPRAMIN) 10 MG tablet Take 20 mg by mouth at bedtime.     . divalproex (DEPAKOTE ER) 250 MG 24 hr tablet Take 250 mg by mouth 2 (two) times daily.    Marland Kitchen docusate sodium (COLACE) 100 MG capsule Take 100 mg by mouth daily as needed for mild constipation or moderate constipation.    Marland Kitchen estradiol (ESTRACE) 1 MG tablet Take 1 mg by mouth daily.    . fish oil-omega-3 fatty acids 1000 MG capsule Take 2 g by mouth daily.     . Fluticasone-Umeclidin-Vilant (TRELEGY ELLIPTA) 100-62.5-25 MCG/INH AEPB Inhale 1 puff into the lungs daily. RINSE MOUTH AFTER USE (Patient not taking: Reported on 09/27/2016) 1 each 0  . LORazepam (ATIVAN) 1 MG tablet Take 1 mg by mouth daily.    . Multiple Vitamin (MULTIVITAMIN PO) Take 1 tablet by mouth daily.     . pravastatin (PRAVACHOL) 40 MG tablet Take 40 mg by mouth daily.     . risperiDONE (RISPERDAL) 0.5 MG tablet Take 0.5 mg by mouth 2 (two) times daily.    . TEKTURNA 300 MG tablet Take 150 mg by  mouth daily.   2  . traMADol (ULTRAM) 50 MG tablet Take 50 mg by mouth 4 (four) times daily as needed for pain.    . valACYclovir (VALTREX) 1000 MG tablet Take 1 g by mouth 3 (three) times daily as needed (cold sores).      No current facility-administered medications for this visit.     Allergies as of 10/29/2016  . (No Known Allergies)    Vitals: BP 114/60   Pulse 86   Resp 16   Ht 5\' 1"  (1.549 m)   Wt 122 lb (55.3 kg)   BMI 23.05 kg/m  Last Weight:  Wt Readings from Last 1 Encounters:  10/29/16 122 lb (55.3 kg)   Last Height:   Ht Readings from Last 1 Encounters:  10/29/16 5\' 1"  (1.549 m)    Physical exam:  General: The patient is awake, alert and appears not in acute distress. The patient is well groomed. Head: Normocephalic, atraumatic.  Neck is supple. Mallampati2, neck circumference: 13.5 Cardiovascular:  Regular rate and rhythm  without  murmurs or carotid bruit, and without distended neck veins. Respiratory: Lungs are clear to auscultation. Skin:  Without evidence of edema, or rash Trunk: BMI is elevated and patient  has normal posture.  Neurologic exam : The patient is awake and alert, oriented to place and time.  Memory clearly impaired, .abnormal  attention span & concentration ability.  Speech is fluent with aphasia. Mood and affect are appropriate. Aloof, fluently reporting delusional content  Cranial nerves: Pupils are equal and briskly reactive to light. Funduscopic exam without evidence of pallor or edema. Extraocular movements  in vertical and horizontal planes intact and without nystagmus.  Visual fields by finger perimetry are intact. Hearing to finger rub intact.  Facial sensation intact to fine touch. Facial motor strength is symmetric and tongue and uvula move midline. Tongue protrusion into either cheek is normal. Shoulder shrug is normal.  Motor exam:   Normal tone, muscle bulk and symmetric strength in all extremities. Sensory:  Fine touch,  pinprick and vibration were tested in all extremities. Proprioception was normal. Coordination: Rapid alternating movements in the fingers/hands were normal. Finger-to-nose maneuver normal without evidence of ataxia, dysmetria or tremor. Gait and station: Patient walks without assistive device , her  gait is shuffling.  Deep tendon reflexes: in the upper and lower extremities are very brisk, symmetric and intact. I did not elicit an Achillis tendon reflex, her toes were downgoing with plantar stimulation, no clonus is noted.   Assessment: CLINICAL DATA:  Initial evaluation for acute expressive aphasia. History of cerebral amyloid angiopathy.  EXAM: MRI HEAD WITHOUT CONTRAST  TECHNIQUE: Multiplanar, multiecho pulse sequences of the brain and surrounding structures were obtained without intravenous contrast.  COMPARISON:  Comparison with prior CT from earlier the same day as well for the brain MRI from 05/25/2016.  FINDINGS: Brain: Diffuse prominence of the CSF containing spaces is compatible with generalized cerebral atrophy. Patchy and confluent T2/FLAIR hyperintensity within the periventricular and deep white matter both cerebral hemispheres most compatible with chronic microvascular ischemic disease, similar to previous.  No abnormal foci of restricted diffusion to suggest acute ischemia. A punctate focus of minimally increased signal intensity within the cortex of the anterior left frontal lobe favored to be artifactual in nature (series 3, image 40). Gray-white matter differentiation maintained. No areas of chronic infarction.  Again seen are multiple subcentimeter foci of susceptibility artifact involving both cerebral hemispheres, predominantly peripherally located. Findings are consistent with known history of cerebral amyloid angiopathy.  No mass lesion, midline shift, or mass effect. No hydrocephalus. No extra-axial fluid collection. The major dural sinuses are  grossly patent.  Pituitary gland normal.  Vascular: Major intracranial vascular flow voids maintained.  Skull and upper cervical spine: Craniocervical junction normal. Mild degenerative spondylolysis noted within the upper cervical spine without significant stenosis. Bone marrow signal intensity normal. No scalp soft tissue abnormality.  Sinuses/Orbits: Globes and orbital soft tissues within normal limits. Patient is status post lens extraction bilaterally. Paranasal sinuses and mastoid air cells are clear. Inner ear structures grossly normal.  IMPRESSION: 1. No acute intracranial process identified. 2. Multiple punctate foci susceptibility artifacts scattered throughout both cerebral hemispheres, consistent with chronic small micro hemorrhages, and compatible with known history of cerebral amyloid angiopathy. 3. Moderate chronic microvascular ischemic disease.   Electronically Signed   By: Jeannine Boga M.D.   On: 07/14/2016 23:01    After physical and neurologic examination, review of laboratory studies, imaging, neurophysiology testing and pre-existing records, assessment is that of :  Mrs. Jerrel Ivory for has positively responded to Seroquel instead of Risperdal which gave her tremors and rigor. She is followed by Dr. Casimiro Needle, he has seen her only once so far. Depakote has been discontinued. Hilario Quarry has been discontinued, Cymbalta seems to help with anxiety. None of the medications have helped with the confabulation.     Plan:  Treatment plan and additional workup :  There is a lot of nocturnal activity, agitation, defensiveness, and fluid confabulation. Sundowning makes it harder for her to be at [facility], she has walked into different rooms.  I think it is fine for her to use Xanax or Ativan as needed if she is extremely anxious and also to help with sleep induction, and I would certainly encourage the use of melatonin. To further evaluate the type  of her dementia, I will order a PET scan so-called metabolic MRI and if positive , I would like to test her for the amyloid  protein precursor protein also known as APP on chromosome 21.     Asencion Partridge Lloyd Cullinan MD 10/29/2016

## 2016-10-31 DIAGNOSIS — F0150 Vascular dementia without behavioral disturbance: Secondary | ICD-10-CM | POA: Diagnosis not present

## 2016-10-31 DIAGNOSIS — M150 Primary generalized (osteo)arthritis: Secondary | ICD-10-CM | POA: Diagnosis not present

## 2016-10-31 DIAGNOSIS — R279 Unspecified lack of coordination: Secondary | ICD-10-CM | POA: Diagnosis not present

## 2016-10-31 DIAGNOSIS — F0391 Unspecified dementia with behavioral disturbance: Secondary | ICD-10-CM | POA: Diagnosis not present

## 2016-10-31 DIAGNOSIS — F0390 Unspecified dementia without behavioral disturbance: Secondary | ICD-10-CM | POA: Diagnosis not present

## 2016-10-31 DIAGNOSIS — R488 Other symbolic dysfunctions: Secondary | ICD-10-CM | POA: Diagnosis not present

## 2016-11-01 DIAGNOSIS — F0281 Dementia in other diseases classified elsewhere with behavioral disturbance: Secondary | ICD-10-CM | POA: Diagnosis not present

## 2016-11-04 DIAGNOSIS — F0390 Unspecified dementia without behavioral disturbance: Secondary | ICD-10-CM | POA: Diagnosis not present

## 2016-11-04 DIAGNOSIS — M150 Primary generalized (osteo)arthritis: Secondary | ICD-10-CM | POA: Diagnosis not present

## 2016-11-04 DIAGNOSIS — F0150 Vascular dementia without behavioral disturbance: Secondary | ICD-10-CM | POA: Diagnosis not present

## 2016-11-04 DIAGNOSIS — R279 Unspecified lack of coordination: Secondary | ICD-10-CM | POA: Diagnosis not present

## 2016-11-04 DIAGNOSIS — F0391 Unspecified dementia with behavioral disturbance: Secondary | ICD-10-CM | POA: Diagnosis not present

## 2016-11-04 DIAGNOSIS — R488 Other symbolic dysfunctions: Secondary | ICD-10-CM | POA: Diagnosis not present

## 2016-11-05 DIAGNOSIS — R488 Other symbolic dysfunctions: Secondary | ICD-10-CM | POA: Diagnosis not present

## 2016-11-05 DIAGNOSIS — F0150 Vascular dementia without behavioral disturbance: Secondary | ICD-10-CM | POA: Diagnosis not present

## 2016-11-05 DIAGNOSIS — F0391 Unspecified dementia with behavioral disturbance: Secondary | ICD-10-CM | POA: Diagnosis not present

## 2016-11-05 DIAGNOSIS — F0390 Unspecified dementia without behavioral disturbance: Secondary | ICD-10-CM | POA: Diagnosis not present

## 2016-11-05 DIAGNOSIS — R279 Unspecified lack of coordination: Secondary | ICD-10-CM | POA: Diagnosis not present

## 2016-11-05 DIAGNOSIS — M150 Primary generalized (osteo)arthritis: Secondary | ICD-10-CM | POA: Diagnosis not present

## 2016-11-07 DIAGNOSIS — F0391 Unspecified dementia with behavioral disturbance: Secondary | ICD-10-CM | POA: Diagnosis not present

## 2016-11-07 DIAGNOSIS — F0150 Vascular dementia without behavioral disturbance: Secondary | ICD-10-CM | POA: Diagnosis not present

## 2016-11-07 DIAGNOSIS — F0390 Unspecified dementia without behavioral disturbance: Secondary | ICD-10-CM | POA: Diagnosis not present

## 2016-11-07 DIAGNOSIS — R488 Other symbolic dysfunctions: Secondary | ICD-10-CM | POA: Diagnosis not present

## 2016-11-07 DIAGNOSIS — R279 Unspecified lack of coordination: Secondary | ICD-10-CM | POA: Diagnosis not present

## 2016-11-07 DIAGNOSIS — M150 Primary generalized (osteo)arthritis: Secondary | ICD-10-CM | POA: Diagnosis not present

## 2016-11-08 ENCOUNTER — Ambulatory Visit (HOSPITAL_COMMUNITY): Payer: Medicare Other

## 2016-11-12 DIAGNOSIS — M150 Primary generalized (osteo)arthritis: Secondary | ICD-10-CM | POA: Diagnosis not present

## 2016-11-12 DIAGNOSIS — F331 Major depressive disorder, recurrent, moderate: Secondary | ICD-10-CM | POA: Diagnosis not present

## 2016-11-12 DIAGNOSIS — F0150 Vascular dementia without behavioral disturbance: Secondary | ICD-10-CM | POA: Diagnosis not present

## 2016-11-12 DIAGNOSIS — R279 Unspecified lack of coordination: Secondary | ICD-10-CM | POA: Diagnosis not present

## 2016-11-12 DIAGNOSIS — F0391 Unspecified dementia with behavioral disturbance: Secondary | ICD-10-CM | POA: Diagnosis not present

## 2016-11-12 DIAGNOSIS — F0390 Unspecified dementia without behavioral disturbance: Secondary | ICD-10-CM | POA: Diagnosis not present

## 2016-11-12 DIAGNOSIS — R488 Other symbolic dysfunctions: Secondary | ICD-10-CM | POA: Diagnosis not present

## 2016-11-13 DIAGNOSIS — M150 Primary generalized (osteo)arthritis: Secondary | ICD-10-CM | POA: Diagnosis not present

## 2016-11-13 DIAGNOSIS — F0391 Unspecified dementia with behavioral disturbance: Secondary | ICD-10-CM | POA: Diagnosis not present

## 2016-11-13 DIAGNOSIS — F0150 Vascular dementia without behavioral disturbance: Secondary | ICD-10-CM | POA: Diagnosis not present

## 2016-11-13 DIAGNOSIS — R279 Unspecified lack of coordination: Secondary | ICD-10-CM | POA: Diagnosis not present

## 2016-11-13 DIAGNOSIS — F0390 Unspecified dementia without behavioral disturbance: Secondary | ICD-10-CM | POA: Diagnosis not present

## 2016-11-13 DIAGNOSIS — R488 Other symbolic dysfunctions: Secondary | ICD-10-CM | POA: Diagnosis not present

## 2016-11-14 ENCOUNTER — Ambulatory Visit: Payer: Medicare Other | Admitting: Neurology

## 2016-11-14 DIAGNOSIS — R279 Unspecified lack of coordination: Secondary | ICD-10-CM | POA: Diagnosis not present

## 2016-11-14 DIAGNOSIS — F0150 Vascular dementia without behavioral disturbance: Secondary | ICD-10-CM | POA: Diagnosis not present

## 2016-11-14 DIAGNOSIS — R488 Other symbolic dysfunctions: Secondary | ICD-10-CM | POA: Diagnosis not present

## 2016-11-14 DIAGNOSIS — M150 Primary generalized (osteo)arthritis: Secondary | ICD-10-CM | POA: Diagnosis not present

## 2016-11-14 DIAGNOSIS — F0391 Unspecified dementia with behavioral disturbance: Secondary | ICD-10-CM | POA: Diagnosis not present

## 2016-11-19 DIAGNOSIS — M150 Primary generalized (osteo)arthritis: Secondary | ICD-10-CM | POA: Diagnosis not present

## 2016-11-19 DIAGNOSIS — F0391 Unspecified dementia with behavioral disturbance: Secondary | ICD-10-CM | POA: Diagnosis not present

## 2016-11-19 DIAGNOSIS — F0150 Vascular dementia without behavioral disturbance: Secondary | ICD-10-CM | POA: Diagnosis not present

## 2016-11-19 DIAGNOSIS — R279 Unspecified lack of coordination: Secondary | ICD-10-CM | POA: Diagnosis not present

## 2016-11-19 DIAGNOSIS — R488 Other symbolic dysfunctions: Secondary | ICD-10-CM | POA: Diagnosis not present

## 2016-11-21 DIAGNOSIS — M150 Primary generalized (osteo)arthritis: Secondary | ICD-10-CM | POA: Diagnosis not present

## 2016-11-21 DIAGNOSIS — R279 Unspecified lack of coordination: Secondary | ICD-10-CM | POA: Diagnosis not present

## 2016-11-21 DIAGNOSIS — R488 Other symbolic dysfunctions: Secondary | ICD-10-CM | POA: Diagnosis not present

## 2016-11-21 DIAGNOSIS — F0150 Vascular dementia without behavioral disturbance: Secondary | ICD-10-CM | POA: Diagnosis not present

## 2016-11-21 DIAGNOSIS — F0391 Unspecified dementia with behavioral disturbance: Secondary | ICD-10-CM | POA: Diagnosis not present

## 2016-11-25 DIAGNOSIS — R488 Other symbolic dysfunctions: Secondary | ICD-10-CM | POA: Diagnosis not present

## 2016-11-25 DIAGNOSIS — F0391 Unspecified dementia with behavioral disturbance: Secondary | ICD-10-CM | POA: Diagnosis not present

## 2016-11-25 DIAGNOSIS — M150 Primary generalized (osteo)arthritis: Secondary | ICD-10-CM | POA: Diagnosis not present

## 2016-11-25 DIAGNOSIS — R279 Unspecified lack of coordination: Secondary | ICD-10-CM | POA: Diagnosis not present

## 2016-11-25 DIAGNOSIS — F0150 Vascular dementia without behavioral disturbance: Secondary | ICD-10-CM | POA: Diagnosis not present

## 2016-11-26 DIAGNOSIS — F0150 Vascular dementia without behavioral disturbance: Secondary | ICD-10-CM | POA: Diagnosis not present

## 2016-11-26 DIAGNOSIS — M150 Primary generalized (osteo)arthritis: Secondary | ICD-10-CM | POA: Diagnosis not present

## 2016-11-26 DIAGNOSIS — R488 Other symbolic dysfunctions: Secondary | ICD-10-CM | POA: Diagnosis not present

## 2016-11-26 DIAGNOSIS — F0391 Unspecified dementia with behavioral disturbance: Secondary | ICD-10-CM | POA: Diagnosis not present

## 2016-11-26 DIAGNOSIS — R279 Unspecified lack of coordination: Secondary | ICD-10-CM | POA: Diagnosis not present

## 2016-11-27 DIAGNOSIS — F0391 Unspecified dementia with behavioral disturbance: Secondary | ICD-10-CM | POA: Diagnosis not present

## 2016-11-27 DIAGNOSIS — M150 Primary generalized (osteo)arthritis: Secondary | ICD-10-CM | POA: Diagnosis not present

## 2016-11-27 DIAGNOSIS — R279 Unspecified lack of coordination: Secondary | ICD-10-CM | POA: Diagnosis not present

## 2016-11-27 DIAGNOSIS — F0150 Vascular dementia without behavioral disturbance: Secondary | ICD-10-CM | POA: Diagnosis not present

## 2016-11-27 DIAGNOSIS — R488 Other symbolic dysfunctions: Secondary | ICD-10-CM | POA: Diagnosis not present

## 2016-11-28 DIAGNOSIS — M150 Primary generalized (osteo)arthritis: Secondary | ICD-10-CM | POA: Diagnosis not present

## 2016-11-28 DIAGNOSIS — F0150 Vascular dementia without behavioral disturbance: Secondary | ICD-10-CM | POA: Diagnosis not present

## 2016-11-28 DIAGNOSIS — F0391 Unspecified dementia with behavioral disturbance: Secondary | ICD-10-CM | POA: Diagnosis not present

## 2016-11-28 DIAGNOSIS — R488 Other symbolic dysfunctions: Secondary | ICD-10-CM | POA: Diagnosis not present

## 2016-11-28 DIAGNOSIS — R279 Unspecified lack of coordination: Secondary | ICD-10-CM | POA: Diagnosis not present

## 2016-12-03 DIAGNOSIS — M150 Primary generalized (osteo)arthritis: Secondary | ICD-10-CM | POA: Diagnosis not present

## 2016-12-03 DIAGNOSIS — R488 Other symbolic dysfunctions: Secondary | ICD-10-CM | POA: Diagnosis not present

## 2016-12-03 DIAGNOSIS — R279 Unspecified lack of coordination: Secondary | ICD-10-CM | POA: Diagnosis not present

## 2016-12-03 DIAGNOSIS — F0150 Vascular dementia without behavioral disturbance: Secondary | ICD-10-CM | POA: Diagnosis not present

## 2016-12-03 DIAGNOSIS — F0391 Unspecified dementia with behavioral disturbance: Secondary | ICD-10-CM | POA: Diagnosis not present

## 2016-12-05 DIAGNOSIS — M150 Primary generalized (osteo)arthritis: Secondary | ICD-10-CM | POA: Diagnosis not present

## 2016-12-05 DIAGNOSIS — F0150 Vascular dementia without behavioral disturbance: Secondary | ICD-10-CM | POA: Diagnosis not present

## 2016-12-05 DIAGNOSIS — R279 Unspecified lack of coordination: Secondary | ICD-10-CM | POA: Diagnosis not present

## 2016-12-05 DIAGNOSIS — F0391 Unspecified dementia with behavioral disturbance: Secondary | ICD-10-CM | POA: Diagnosis not present

## 2016-12-05 DIAGNOSIS — R488 Other symbolic dysfunctions: Secondary | ICD-10-CM | POA: Diagnosis not present

## 2016-12-06 DIAGNOSIS — F0150 Vascular dementia without behavioral disturbance: Secondary | ICD-10-CM | POA: Diagnosis not present

## 2016-12-06 DIAGNOSIS — M150 Primary generalized (osteo)arthritis: Secondary | ICD-10-CM | POA: Diagnosis not present

## 2016-12-06 DIAGNOSIS — R488 Other symbolic dysfunctions: Secondary | ICD-10-CM | POA: Diagnosis not present

## 2016-12-06 DIAGNOSIS — R279 Unspecified lack of coordination: Secondary | ICD-10-CM | POA: Diagnosis not present

## 2016-12-06 DIAGNOSIS — F0391 Unspecified dementia with behavioral disturbance: Secondary | ICD-10-CM | POA: Diagnosis not present

## 2016-12-09 DIAGNOSIS — R488 Other symbolic dysfunctions: Secondary | ICD-10-CM | POA: Diagnosis not present

## 2016-12-09 DIAGNOSIS — M150 Primary generalized (osteo)arthritis: Secondary | ICD-10-CM | POA: Diagnosis not present

## 2016-12-09 DIAGNOSIS — F0150 Vascular dementia without behavioral disturbance: Secondary | ICD-10-CM | POA: Diagnosis not present

## 2016-12-09 DIAGNOSIS — R279 Unspecified lack of coordination: Secondary | ICD-10-CM | POA: Diagnosis not present

## 2016-12-09 DIAGNOSIS — F0391 Unspecified dementia with behavioral disturbance: Secondary | ICD-10-CM | POA: Diagnosis not present

## 2016-12-11 DIAGNOSIS — R488 Other symbolic dysfunctions: Secondary | ICD-10-CM | POA: Diagnosis not present

## 2016-12-11 DIAGNOSIS — R279 Unspecified lack of coordination: Secondary | ICD-10-CM | POA: Diagnosis not present

## 2016-12-11 DIAGNOSIS — F0391 Unspecified dementia with behavioral disturbance: Secondary | ICD-10-CM | POA: Diagnosis not present

## 2016-12-11 DIAGNOSIS — M150 Primary generalized (osteo)arthritis: Secondary | ICD-10-CM | POA: Diagnosis not present

## 2016-12-11 DIAGNOSIS — F0150 Vascular dementia without behavioral disturbance: Secondary | ICD-10-CM | POA: Diagnosis not present

## 2016-12-12 DIAGNOSIS — F0150 Vascular dementia without behavioral disturbance: Secondary | ICD-10-CM | POA: Diagnosis not present

## 2016-12-12 DIAGNOSIS — R279 Unspecified lack of coordination: Secondary | ICD-10-CM | POA: Diagnosis not present

## 2016-12-12 DIAGNOSIS — F331 Major depressive disorder, recurrent, moderate: Secondary | ICD-10-CM | POA: Diagnosis not present

## 2016-12-12 DIAGNOSIS — R488 Other symbolic dysfunctions: Secondary | ICD-10-CM | POA: Diagnosis not present

## 2016-12-12 DIAGNOSIS — F0391 Unspecified dementia with behavioral disturbance: Secondary | ICD-10-CM | POA: Diagnosis not present

## 2016-12-12 DIAGNOSIS — M150 Primary generalized (osteo)arthritis: Secondary | ICD-10-CM | POA: Diagnosis not present

## 2016-12-17 DIAGNOSIS — M150 Primary generalized (osteo)arthritis: Secondary | ICD-10-CM | POA: Diagnosis not present

## 2016-12-17 DIAGNOSIS — F0150 Vascular dementia without behavioral disturbance: Secondary | ICD-10-CM | POA: Diagnosis not present

## 2016-12-17 DIAGNOSIS — R279 Unspecified lack of coordination: Secondary | ICD-10-CM | POA: Diagnosis not present

## 2016-12-17 DIAGNOSIS — F0391 Unspecified dementia with behavioral disturbance: Secondary | ICD-10-CM | POA: Diagnosis not present

## 2016-12-17 DIAGNOSIS — R488 Other symbolic dysfunctions: Secondary | ICD-10-CM | POA: Diagnosis not present

## 2016-12-19 DIAGNOSIS — M150 Primary generalized (osteo)arthritis: Secondary | ICD-10-CM | POA: Diagnosis not present

## 2016-12-19 DIAGNOSIS — R488 Other symbolic dysfunctions: Secondary | ICD-10-CM | POA: Diagnosis not present

## 2016-12-19 DIAGNOSIS — F0391 Unspecified dementia with behavioral disturbance: Secondary | ICD-10-CM | POA: Diagnosis not present

## 2016-12-19 DIAGNOSIS — F0150 Vascular dementia without behavioral disturbance: Secondary | ICD-10-CM | POA: Diagnosis not present

## 2016-12-19 DIAGNOSIS — R279 Unspecified lack of coordination: Secondary | ICD-10-CM | POA: Diagnosis not present

## 2016-12-23 ENCOUNTER — Ambulatory Visit: Payer: Medicare Other | Admitting: Internal Medicine

## 2016-12-23 DIAGNOSIS — R279 Unspecified lack of coordination: Secondary | ICD-10-CM | POA: Diagnosis not present

## 2016-12-23 DIAGNOSIS — F0150 Vascular dementia without behavioral disturbance: Secondary | ICD-10-CM | POA: Diagnosis not present

## 2016-12-23 DIAGNOSIS — R488 Other symbolic dysfunctions: Secondary | ICD-10-CM | POA: Diagnosis not present

## 2016-12-23 DIAGNOSIS — F0391 Unspecified dementia with behavioral disturbance: Secondary | ICD-10-CM | POA: Diagnosis not present

## 2016-12-23 DIAGNOSIS — M150 Primary generalized (osteo)arthritis: Secondary | ICD-10-CM | POA: Diagnosis not present

## 2016-12-24 DIAGNOSIS — M150 Primary generalized (osteo)arthritis: Secondary | ICD-10-CM | POA: Diagnosis not present

## 2016-12-24 DIAGNOSIS — R279 Unspecified lack of coordination: Secondary | ICD-10-CM | POA: Diagnosis not present

## 2016-12-24 DIAGNOSIS — F331 Major depressive disorder, recurrent, moderate: Secondary | ICD-10-CM | POA: Diagnosis not present

## 2016-12-24 DIAGNOSIS — F0150 Vascular dementia without behavioral disturbance: Secondary | ICD-10-CM | POA: Diagnosis not present

## 2016-12-24 DIAGNOSIS — R488 Other symbolic dysfunctions: Secondary | ICD-10-CM | POA: Diagnosis not present

## 2016-12-24 DIAGNOSIS — F0391 Unspecified dementia with behavioral disturbance: Secondary | ICD-10-CM | POA: Diagnosis not present

## 2016-12-25 DIAGNOSIS — R279 Unspecified lack of coordination: Secondary | ICD-10-CM | POA: Diagnosis not present

## 2016-12-25 DIAGNOSIS — R488 Other symbolic dysfunctions: Secondary | ICD-10-CM | POA: Diagnosis not present

## 2016-12-25 DIAGNOSIS — M150 Primary generalized (osteo)arthritis: Secondary | ICD-10-CM | POA: Diagnosis not present

## 2016-12-25 DIAGNOSIS — F0391 Unspecified dementia with behavioral disturbance: Secondary | ICD-10-CM | POA: Diagnosis not present

## 2016-12-25 DIAGNOSIS — F0150 Vascular dementia without behavioral disturbance: Secondary | ICD-10-CM | POA: Diagnosis not present

## 2016-12-29 DIAGNOSIS — F0391 Unspecified dementia with behavioral disturbance: Secondary | ICD-10-CM | POA: Diagnosis not present

## 2016-12-29 DIAGNOSIS — F0150 Vascular dementia without behavioral disturbance: Secondary | ICD-10-CM | POA: Diagnosis not present

## 2016-12-29 DIAGNOSIS — R279 Unspecified lack of coordination: Secondary | ICD-10-CM | POA: Diagnosis not present

## 2016-12-29 DIAGNOSIS — R488 Other symbolic dysfunctions: Secondary | ICD-10-CM | POA: Diagnosis not present

## 2016-12-29 DIAGNOSIS — M150 Primary generalized (osteo)arthritis: Secondary | ICD-10-CM | POA: Diagnosis not present

## 2017-01-03 DIAGNOSIS — G43909 Migraine, unspecified, not intractable, without status migrainosus: Secondary | ICD-10-CM | POA: Diagnosis not present

## 2017-01-03 DIAGNOSIS — Z0001 Encounter for general adult medical examination with abnormal findings: Secondary | ICD-10-CM | POA: Diagnosis not present

## 2017-01-03 DIAGNOSIS — Z23 Encounter for immunization: Secondary | ICD-10-CM | POA: Diagnosis not present

## 2017-01-03 DIAGNOSIS — N390 Urinary tract infection, site not specified: Secondary | ICD-10-CM | POA: Diagnosis not present

## 2017-01-03 DIAGNOSIS — K589 Irritable bowel syndrome without diarrhea: Secondary | ICD-10-CM | POA: Diagnosis not present

## 2017-01-03 DIAGNOSIS — I129 Hypertensive chronic kidney disease with stage 1 through stage 4 chronic kidney disease, or unspecified chronic kidney disease: Secondary | ICD-10-CM | POA: Diagnosis not present

## 2017-01-03 DIAGNOSIS — F322 Major depressive disorder, single episode, severe without psychotic features: Secondary | ICD-10-CM | POA: Diagnosis not present

## 2017-01-03 DIAGNOSIS — G309 Alzheimer's disease, unspecified: Secondary | ICD-10-CM | POA: Diagnosis not present

## 2017-01-03 DIAGNOSIS — F411 Generalized anxiety disorder: Secondary | ICD-10-CM | POA: Diagnosis not present

## 2017-01-03 DIAGNOSIS — F05 Delirium due to known physiological condition: Secondary | ICD-10-CM | POA: Diagnosis not present

## 2017-01-03 DIAGNOSIS — E46 Unspecified protein-calorie malnutrition: Secondary | ICD-10-CM | POA: Diagnosis not present

## 2017-01-03 DIAGNOSIS — N182 Chronic kidney disease, stage 2 (mild): Secondary | ICD-10-CM | POA: Diagnosis not present

## 2017-01-06 ENCOUNTER — Emergency Department (HOSPITAL_COMMUNITY): Payer: Medicare Other

## 2017-01-06 ENCOUNTER — Emergency Department (HOSPITAL_COMMUNITY)
Admission: EM | Admit: 2017-01-06 | Discharge: 2017-01-06 | Disposition: A | Discharge status: Home / Self Care | Payer: Medicare Other | Attending: Emergency Medicine | Admitting: Emergency Medicine

## 2017-01-06 ENCOUNTER — Encounter (HOSPITAL_COMMUNITY): Payer: Self-pay

## 2017-01-06 DIAGNOSIS — S0083XA Contusion of other part of head, initial encounter: Secondary | ICD-10-CM

## 2017-01-06 DIAGNOSIS — Y999 Unspecified external cause status: Secondary | ICD-10-CM | POA: Diagnosis not present

## 2017-01-06 DIAGNOSIS — S0993XA Unspecified injury of face, initial encounter: Secondary | ICD-10-CM | POA: Diagnosis not present

## 2017-01-06 DIAGNOSIS — Y929 Unspecified place or not applicable: Secondary | ICD-10-CM | POA: Diagnosis not present

## 2017-01-06 DIAGNOSIS — Z87891 Personal history of nicotine dependence: Secondary | ICD-10-CM | POA: Diagnosis not present

## 2017-01-06 DIAGNOSIS — T148XXA Other injury of unspecified body region, initial encounter: Secondary | ICD-10-CM | POA: Diagnosis not present

## 2017-01-06 DIAGNOSIS — Y939 Activity, unspecified: Secondary | ICD-10-CM | POA: Diagnosis not present

## 2017-01-06 DIAGNOSIS — Z79899 Other long term (current) drug therapy: Secondary | ICD-10-CM | POA: Insufficient documentation

## 2017-01-06 DIAGNOSIS — S0990XA Unspecified injury of head, initial encounter: Secondary | ICD-10-CM | POA: Diagnosis not present

## 2017-01-06 DIAGNOSIS — J45909 Unspecified asthma, uncomplicated: Secondary | ICD-10-CM | POA: Diagnosis not present

## 2017-01-06 DIAGNOSIS — W228XXA Striking against or struck by other objects, initial encounter: Secondary | ICD-10-CM | POA: Diagnosis not present

## 2017-01-06 DIAGNOSIS — S199XXA Unspecified injury of neck, initial encounter: Secondary | ICD-10-CM | POA: Diagnosis not present

## 2017-01-06 DIAGNOSIS — Z85828 Personal history of other malignant neoplasm of skin: Secondary | ICD-10-CM | POA: Diagnosis not present

## 2017-01-06 DIAGNOSIS — R51 Headache: Secondary | ICD-10-CM | POA: Diagnosis not present

## 2017-01-06 DIAGNOSIS — Z8673 Personal history of transient ischemic attack (TIA), and cerebral infarction without residual deficits: Secondary | ICD-10-CM | POA: Insufficient documentation

## 2017-01-06 DIAGNOSIS — I6789 Other cerebrovascular disease: Secondary | ICD-10-CM | POA: Diagnosis not present

## 2017-01-06 NOTE — ED Provider Notes (Signed)
TIME SEEN: 4:56 AM  CHIEF COMPLAINT: Head injury  HPI: Patient is a 76 year old female with history of dementia who lives at [place] who presents to the emergency department after she is had a facial injury. Patient's caregiver reports that she gets up and that all night and grams. She knocked a picture frame off the wall that hit her in the face. There was no loss of consciousness and she was not knocked to the ground. They do not think that she is on anticoagulation, antiplatelets. She is at her baseline neurologically.  ROS: Level V caveat for dementia  PAST MEDICAL HISTORY/PAST SURGICAL HISTORY:  Past Medical History:  Diagnosis Date  . Arthritis    back and neck  . Carotid artery stenosis   . Cerebral amyloid angiopathy (CODE) 06/20/2016  . Chronic rhinosinusitis   . DDD (degenerative disc disease)   . Depression   . Diverticulosis of colon   . Frequency of urination   . Glaucoma   . Headache disorder 12/13/2014   Right temple  . Hearing loss   . History of kidney stones    MULTIBLE SURGICAL INTERVENTIONS  . History of skin cancer HX TOPICAL FACIAL [place] 2013  . History of TIA (transient ischemic attack)    NOV 2013-- NO RESIDUAL  . Hypertension   . IBS (irritable bowel syndrome)   . Memory difficulty 05/14/2016  . Memory loss   . Mild asthma    COLD INDUCED  . Right ureteral stone   . Seasonal and perennial allergic rhinitis   . Shingles    EYEBALL 09-17-2013  . Spondylosis     MEDICATIONS:  Prior to Admission medications   Medication Sig Start Date End Date Taking? Authorizing Provider  atorvastatin (LIPITOR) 10 MG tablet  10/22/16   Historical Provider, MD  benzonatate (TESSALON) 200 MG capsule Take 1 capsule (200 mg total) by mouth 3 (three) times daily as needed for cough. 08/22/16   Deneise Lever, MD  docusate sodium (COLACE) 100 MG capsule Take 100 mg by mouth daily as needed for mild constipation or moderate constipation.    Historical Provider, MD   donepezil (ARICEPT) 5 MG tablet  10/22/16   Historical Provider, MD  DULoxetine (CYMBALTA) 30 MG capsule Take 30 mg by mouth daily.    Historical Provider, MD  estradiol (ESTRACE) 1 MG tablet Take 1 mg by mouth daily.    Historical Provider, MD  LORazepam (ATIVAN) 1 MG tablet Take 1 mg by mouth daily. 09/25/16   Historical Provider, MD  Multiple Vitamin (MULTIVITAMIN PO) Take 1 tablet by mouth daily.     Historical Provider, MD  QUEtiapine (SEROQUEL) 100 MG tablet  10/22/16   Historical Provider, MD  TEKTURNA 300 MG tablet Take 150 mg by mouth daily.  07/18/16   Historical Provider, MD  traMADol (ULTRAM) 50 MG tablet Take 50 mg by mouth 4 (four) times daily as needed for pain. 09/20/16   Historical Provider, MD  traZODone (DESYREL) 50 MG tablet Take 1 tablet (50 mg total) by mouth at bedtime as needed for sleep. 10/29/16   Asencion Partridge Dohmeier, MD  valACYclovir (VALTREX) 1000 MG tablet Take 1 g by mouth 3 (three) times daily as needed (cold sores).  08/27/16   Historical Provider, MD    ALLERGIES:  No Known Allergies  SOCIAL HISTORY:  Social History  Substance Use Topics  . Smoking status: Former Smoker    Years: 23.00    Types: Cigarettes    Quit date: 09/16/1985  .  Smokeless tobacco: Never Used  . Alcohol use Yes     Comment: social - weekend    FAMILY HISTORY: Family History  Problem Relation Age of Onset  . COPD Mother   . Breast cancer Mother   . Colon cancer Neg Hx   . Migraines Neg Hx     EXAM: BP (!) 164/49 (BP Location: Right Arm)   Pulse 83   Temp 97.8 F (36.6 C) (Oral)   Resp 17   Ht 5\' 3"  (1.6 m)   Wt 122 lb (55.3 kg)   SpO2 99%   BMI 21.61 kg/m  CONSTITUTIONAL: Alert and oriented To person but doesn't answer questions appropriately. Appears comfortable and in no distress HEAD: Normocephalic; ecchymosis noted to bilateral cheeks EYES: Conjunctivae clear, PERRL, EOMI ENT: normal nose; no rhinorrhea; moist mucous membranes; pharynx without lesions noted; no dental  injury; no septal hematoma, no bony deformity or bony tenderness on exam NECK: Supple, no meningismus, no LAD; no midline spinal tenderness, step-off or deformity; trachea midline CARD: RRR; S1 and S2 appreciated; no murmurs, no clicks, no rubs, no gallops RESP: Normal chest excursion without splinting or tachypnea; breath sounds clear and equal bilaterally; no wheezes, no rhonchi, no rales; no hypoxia or respiratory distress CHEST:  chest wall stable, no crepitus or ecchymosis or deformity, nontender to palpation; no flail chest ABD/GI: Normal bowel sounds; non-distended; soft, non-tender, no rebound, no guarding; no ecchymosis or other lesions noted PELVIS:  stable, nontender to palpation BACK:  The back appears normal and is non-tender to palpation, there is no CVA tenderness; no midline spinal tenderness, step-off or deformity EXT: Normal ROM in all joints; non-tender to palpation; no edema; normal capillary refill; no cyanosis, no bony tenderness or bony deformity of patient's extremities, no joint effusion, compartments are soft, extremities are warm and well-perfused, no ecchymosis SKIN: Normal color for age and race; warm NEURO: Moves all extremities equally, normal speech, no facial droop, does not follow commands, appears confused but family reports is baseline. PSYCH: The patient's mood and manner are appropriate. Grooming and personal hygiene are appropriate.  MEDICAL DECISION MAKING: Patient here with bruising to her face after a picture frame fell off the wall when she was pulling on it and hit her in the face. Her husband and caregiver seem appropriate, caring. I do not think that this was nonaccidental trauma. No lacerations. Will obtain CTs of her head, cervical spine and face. She does not appear to be in any distress.  ED PROGRESS: Will dc home if imaging negative.  Dr. Wilson Singer will follow up on CT scans.  I reviewed all nursing notes, vitals, pertinent previous records, EKGs, lab  and urine results, imaging (as available).      Howe, DO 01/06/17 [id number]

## 2017-01-06 NOTE — ED Notes (Signed)
Patient transported to CT 

## 2017-01-06 NOTE — ED Notes (Signed)
Per husband and caregiver patient did not fall. She is a wander due to dementia and was up fixing pictures on the wall when one of them fell and hit her in the face. Some facial bruising and a small laceration on right side of face near eye. Pt denies pain. Neuro is baseline for her. She is alert to self only.

## 2017-01-06 NOTE — ED Triage Notes (Signed)
From [facility] states staff a picture frame fell on pt face with brushing to both cheek bones no LOC.

## 2017-01-08 DIAGNOSIS — F0391 Unspecified dementia with behavioral disturbance: Secondary | ICD-10-CM | POA: Diagnosis not present

## 2017-01-08 DIAGNOSIS — F0150 Vascular dementia without behavioral disturbance: Secondary | ICD-10-CM | POA: Diagnosis not present

## 2017-01-08 DIAGNOSIS — M150 Primary generalized (osteo)arthritis: Secondary | ICD-10-CM | POA: Diagnosis not present

## 2017-01-08 DIAGNOSIS — R279 Unspecified lack of coordination: Secondary | ICD-10-CM | POA: Diagnosis not present

## 2017-01-08 DIAGNOSIS — R488 Other symbolic dysfunctions: Secondary | ICD-10-CM | POA: Diagnosis not present

## 2017-01-14 DIAGNOSIS — F0150 Vascular dementia without behavioral disturbance: Secondary | ICD-10-CM | POA: Diagnosis not present

## 2017-01-14 DIAGNOSIS — R488 Other symbolic dysfunctions: Secondary | ICD-10-CM | POA: Diagnosis not present

## 2017-01-16 ENCOUNTER — Encounter: Payer: Self-pay | Admitting: Internal Medicine

## 2017-01-16 ENCOUNTER — Non-Acute Institutional Stay (SKILLED_NURSING_FACILITY): Payer: Medicare Other | Admitting: Internal Medicine

## 2017-01-16 DIAGNOSIS — I10 Essential (primary) hypertension: Secondary | ICD-10-CM | POA: Diagnosis not present

## 2017-01-16 DIAGNOSIS — M8580 Other specified disorders of bone density and structure, unspecified site: Secondary | ICD-10-CM

## 2017-01-16 DIAGNOSIS — N200 Calculus of kidney: Secondary | ICD-10-CM | POA: Diagnosis not present

## 2017-01-16 DIAGNOSIS — F329 Major depressive disorder, single episode, unspecified: Secondary | ICD-10-CM

## 2017-01-16 DIAGNOSIS — E440 Moderate protein-calorie malnutrition: Secondary | ICD-10-CM | POA: Diagnosis not present

## 2017-01-16 DIAGNOSIS — Z8719 Personal history of other diseases of the digestive system: Secondary | ICD-10-CM

## 2017-01-16 DIAGNOSIS — G300 Alzheimer's disease with early onset: Secondary | ICD-10-CM

## 2017-01-16 DIAGNOSIS — G4700 Insomnia, unspecified: Secondary | ICD-10-CM

## 2017-01-16 DIAGNOSIS — F02818 Dementia in other diseases classified elsewhere, unspecified severity, with other behavioral disturbance: Secondary | ICD-10-CM

## 2017-01-16 DIAGNOSIS — F0281 Dementia in other diseases classified elsewhere with behavioral disturbance: Secondary | ICD-10-CM

## 2017-01-16 DIAGNOSIS — N390 Urinary tract infection, site not specified: Secondary | ICD-10-CM

## 2017-01-16 DIAGNOSIS — I680 Cerebral amyloid angiopathy: Secondary | ICD-10-CM

## 2017-01-16 DIAGNOSIS — E7800 Pure hypercholesterolemia, unspecified: Secondary | ICD-10-CM | POA: Diagnosis not present

## 2017-01-16 DIAGNOSIS — F419 Anxiety disorder, unspecified: Secondary | ICD-10-CM | POA: Diagnosis not present

## 2017-01-16 NOTE — Progress Notes (Signed)
LOCATION: [facility]  PCP: Mayra Neer, MD   Code Status: DNR  Goals of care: Advanced Directive information Advanced Directives 01/16/2017  Does Patient Have a Medical Advance Directive? Yes  Type of Advance Directive Out of facility DNR (pink MOST or yellow form)  Does patient want to make changes to medical advance directive? No - Patient declined  Copy of [place] in Chart? -  Would patient like information on creating a medical advance directive? -  Pre-existing out of facility DNR order (yellow form or pink MOST form) -       Extended Emergency Contact Information Primary Emergency Contact: [name] Address: [address]          [place], [place] [address] [place] Phone: [phone] Mobile Phone: [phone] Relation: Spouse   No Known Allergies  Chief Complaint  Patient presents with  . New Admit To SNF    New Admission Visit      HPI:  Patient is a 76 y.o. female seen today for long term care from [facility]. She has rapidly progressed Alzheimer's dementia, protein calorie malnutrition, depression, protein calorie malnutrition, ckd stage 2, HTN, IBS, calculus of kidney among others. She is seen in her room today with her husband and caregiver present. She is pleasantly confused.   Review of Systems:  Constitutional: Negative for fever, chills, diaphoresis.  HENT: Negative for headache, congestion, nasal discharge, sore throat, difficulty swallowing.   Eyes: Negative for eye pain, blurred vision, double vision and discharge.  Respiratory: Negative for cough, shortness of breath and wheezing.   Cardiovascular: Negative for chest pain, palpitations, leg swelling.  Gastrointestinal: Negative for heartburn, nausea, vomiting, abdominal pain, loss of appetite. Has history of IBS Genitourinary: Negative for dysuria.  Musculoskeletal: Negative for back pain, recent fall. per husband, had 2 falls in last year.    Skin: Negative for itching, rash.  Neurological: Negative for dizziness. Psychiatric/Behavioral: positive for dementia.    Past Medical History:  Diagnosis Date  . Arthritis    back and neck  . Carotid artery stenosis   . Cerebral amyloid angiopathy (CODE) 06/20/2016  . Chronic rhinosinusitis   . DDD (degenerative disc disease)   . Depression   . Diverticulosis of colon   . Frequency of urination   . Glaucoma   . Headache disorder 12/13/2014   Right temple  . Hearing loss   . History of kidney stones    MULTIBLE SURGICAL INTERVENTIONS  . History of skin cancer HX TOPICAL FACIAL [place] 2013  . History of TIA (transient ischemic attack)    NOV 2013-- NO RESIDUAL  . Hypertension   . IBS (irritable bowel syndrome)   . Memory difficulty 05/14/2016  . Memory loss   . Mild asthma    COLD INDUCED  . Right ureteral stone   . Seasonal and perennial allergic rhinitis   . Shingles    EYEBALL 09-17-2013  . Spondylosis    Past Surgical History:  Procedure Laterality Date  . BUNIONECTOMY     BILATERAL  . CALDWELL LUC     sinus drainage procedure  . CYSTO/ BILATERAL URETEROSCOPIC STONE EXTRACTIONS  02-09-2003  . CYSTO/ BILATERAL URETEROSCOPY/  LASER OF STONES LEFT SIDE  06-02-2007  . CYSTOSCOPY/RETROGRADE/URETEROSCOPY/STONE EXTRACTION WITH BASKET  03/31/2012   Procedure: CYSTOSCOPY/RETROGRADE/URETEROSCOPY/STONE EXTRACTION WITH BASKET;  Surgeon: Malka So, MD;  Location: [facility];  Service: Urology;  Laterality: Right;   . CYSTOSCOPY/RETROGRADE/URETEROSCOPY/STONE EXTRACTION WITH BASKET Right  03/29/2013   Procedure: CYSTOSCOPY//URETEROSCOPY/STONE EXTRACTION WITH BASKET;  Surgeon: Malka So, MD;  Location: [facility];  Service: Urology;  Laterality: Right;  . CYSTOSCOPY/RETROGRADE/URETEROSCOPY/STONE EXTRACTION WITH BASKET Right 10/14/2013   Procedure: CYSTOSCOPY/RETROGRADE/URETEROSCOPY/STONE EXTRACTION WITH BASKET;  Surgeon: Irine Seal, MD;  Location: [facility];  Service: Urology;  Laterality: Right;  . ESOPHAGOGASTRODUODENOSCOPY (EGD) WITH PROPOFOL N/A 04/12/2015   Procedure: ESOPHAGOGASTRODUODENOSCOPY (EGD) WITH PROPOFOL;  Surgeon: Arta Silence, MD;  Location: [facility];  Service: Endoscopy;  Laterality: N/A;  . EUS N/A 04/12/2015   Procedure: ESOPHAGEAL ENDOSCOPIC ULTRASOUND (EUS) RADIAL;  Surgeon: Arta Silence, MD;  Location: [facility];  Service: Endoscopy;  Laterality: N/A;  . EXTRACORPOREAL SHOCK WAVE LITHOTRIPSY  about 6546/ Dr Reece Agar   Never completed procedure-could not tolerate pain. Had seizures due to pain  . EYE SURGERY  AGE 36   INJURY  . HERNIA REPAIR  AGE 30  . HOLMIUM LASER APPLICATION Right 01/17/5464   Procedure: HOLMIUM LASER APPLICATION;  Surgeon: Irine Seal, MD;  Location: [facility];  Service: Urology;  Laterality: Right;  . LAPAROSCOPIC CHOLECYSTECTOMY  01-12-2005  . LEFT URETEROSCOPIC STONE EXTRACTION    LAST ONE 04-28-2009   SEVERAL SURGICAL STONE EXTRACTIONS  . LUMBAR LAMINECTOMY  09-30-2002   L4 - 5;  SPONDYLOLISTHISES W/ STENOSIS AND RADICULOPATHY  . PERCUTANEOUS NEPHROSTOLITHOTOMY  1975  . RIGHT URETEROSCOPIC STONE EXTRACTION  LAST ONE 02-21-2011   MULTIPLE SURGICAL STONE EXTRACTIONS  . TONSILECTOMY, ADENOIDECTOMY, BILATERAL MYRINGOTOMY AND TUBES  CHILD  . VAGINAL HYSTERECTOMY  1991   W/ BILATERAL SALPINGOOPHECTOMY   Social History:   reports that she quit smoking about 31 years ago. Her smoking use included Cigarettes. She quit after 23.00 years of use. She has never used smokeless tobacco. She reports that she drinks alcohol. She reports that she does not use drugs.  Family History  Problem Relation Age of Onset  . COPD Mother   . Breast cancer Mother   . Colon cancer Neg Hx   . Migraines Neg Hx     Medications: Allergies as of 01/16/2017   No Known Allergies     Medication List       Accurate as of 01/16/17  3:51 PM. Always use your most recent med list.           aliskiren 150 MG tablet Commonly known as:  TEKTURNA Take 150 mg by mouth daily.   aspirin EC 81 MG tablet Take 81 mg by mouth daily.   atorvastatin 10 MG tablet Commonly known as:  LIPITOR Take 10 mg by mouth at bedtime.   Cranberry 500 MG Tabs Take 1 tablet by mouth daily.   docusate sodium 100 MG capsule Commonly known as:  COLACE Take 100 mg by mouth daily as needed for mild constipation or moderate constipation.   DULoxetine 30 MG capsule Commonly known as:  CYMBALTA Take 60 mg by mouth daily.   estradiol 1 MG tablet Commonly known as:  ESTRACE Take 1 mg by mouth daily.   feeding supplement Liqd Take 1 Container by mouth 2 (two) times daily between meals.   loperamide 2 MG capsule Commonly known as:  IMODIUM Take 2 mg by mouth 4 (four) times daily as needed for diarrhea or loose stools.   LORazepam 1 MG tablet Commonly known as:  ATIVAN Take 0.5 mg by mouth 3 (three) times daily with meals. May also take 0.5 mg by mouth every 4 hours as needed for agitation or restlessness  Melatonin 5 MG Caps Take 5 mg by mouth at bedtime.   OLANZapine 5 MG tablet Commonly known as:  ZYPREXA Take 5 mg by mouth daily.   QUEtiapine 100 MG tablet Commonly known as:  SEROQUEL Take 100 mg by mouth at bedtime.   THERA-M PO Take 1 tablet by mouth daily.   traMADol 50 MG tablet Commonly known as:  ULTRAM Take 50 mg by mouth every 6 (six) hours as needed for moderate pain or severe pain.   traZODone 150 MG tablet Commonly known as:  DESYREL Take 150 mg by mouth at bedtime.       Immunizations: Immunization History  Administered Date(s) Administered  . Hepatitis A 12/25/2012  . Influenza Split 06/17/2011, 06/13/2016  . Influenza Whole 06/16/2010  . Pneumococcal Polysaccharide-23 11/15/2011  . Tdap 12/25/2012  . Typhoid Inactivated 12/25/2012  . Yellow Fever 12/25/2012  . Zoster 04/16/2012     Physical Exam: Vitals:   01/16/17 1439  BP: 131/75  Pulse:  82  Resp: 20  Temp: 97.9 F (36.6 C)  TempSrc: Oral  Weight: 117 lb (53.1 kg)   Body mass index is 20.73 kg/m.  General- elderly female, thin built, in no acute distress Head- normocephalic, atraumatic Nose- no maxillary or frontal sinus tenderness, no nasal discharge Throat- moist mucus membrane, normal oropharynx Eyes- PERRLA, EOMI, no pallor, no icterus, no discharge, normal conjunctiva, normal sclera Neck- no cervical lymphadenopathy Cardiovascular- normal s1,s2, no murmur Respiratory- bilateral clear to auscultation, no wheeze, no rhonchi, no crackles, no use of accessory muscles Abdomen- bowel sounds present, soft, non tender, no guarding or rigidity Musculoskeletal- able to move all 4 extremities, gait not checked Neurological- alert and oriented to person only Skin- warm and dry Psychiatry- normal mood and affect    Labs reviewed: Basic Metabolic Panel:  Recent Labs  07/15/16 0444 08/22/16 1047 09/26/16 2344  NA 139 142 140  K 3.9 3.9 4.2  CL 107 105 108  CO2 25 27 26   GLUCOSE 110* 89 108*  BUN 11 15 17   CREATININE 0.95 1.08 1.12*  CALCIUM 9.2 9.9 9.0   Liver Function Tests:  Recent Labs  07/14/16 1909 07/15/16 0444  AST 26 22  ALT 18 16  ALKPHOS 53 45  BILITOT 0.7 0.9  PROT 6.4* 5.6*  ALBUMIN 3.8 3.3*   No results for input(s): LIPASE, AMYLASE in the last 8760 hours. No results for input(s): AMMONIA in the last 8760 hours. CBC:  Recent Labs  07/14/16 1909  07/15/16 0444 08/22/16 1047 09/26/16 2344  WBC 7.7  --  6.1 7.6 6.8  NEUTROABS 4.3  --   --  5.1  --   HGB 14.5  < > 13.4 15.2* 13.8  HCT 42.4  < > 39.7 43.9 40.3  MCV 95.7  --  95.4 95.3 93.9  PLT 241  --  196 247.0 143*  < > = values in this interval not displayed.   Radiological Exams: Ct Head Wo Contrast  Result Date: 01/06/2017 CLINICAL DATA:  Facial injury. EXAM: CT HEAD WITHOUT CONTRAST CT MAXILLOFACIAL WITHOUT CONTRAST CT CERVICAL SPINE WITHOUT CONTRAST TECHNIQUE:  Multidetector CT imaging of the head, cervical spine, and maxillofacial structures were performed using the standard protocol without intravenous contrast. Multiplanar CT image reconstructions of the cervical spine and maxillofacial structures were also generated. COMPARISON:  CT scan of September 26, 2016. FINDINGS: CT HEAD FINDINGS Brain: Mild chronic ischemic white matter disease is noted. No mass effect or midline shift is noted. Ventricular size  is within normal limits. There is no evidence of mass lesion, hemorrhage or acute infarction. Vascular: Atherosclerosis of carotid siphons is noted. Skull: Normal. Negative for fracture or focal lesion. Other: None. CT MAXILLOFACIAL FINDINGS Osseous: Status post surgical resection of right middle turbinate and antrostomy. No fracture is noted. Orbits: Negative. No traumatic or inflammatory finding. Sinuses: No evidence of sinusitis. Postsurgical changes of right maxillary sinus as described above. Soft tissues: Negative. CT CERVICAL SPINE FINDINGS Alignment: Grade 1 anterolisthesis of C4-5 is noted secondary to posterior facet joint hypertrophy. Grade 1 retrolisthesis of C5-6 is noted secondary to severe degenerative disc disease at this level. Grade 1 anterolisthesis of C7-T1 as secondary to posterior facet joint hypertrophy. Skull base and vertebrae: No acute fracture. No primary bone lesion or focal pathologic process. Soft tissues and spinal canal: No prevertebral fluid or swelling. No visible canal hematoma. Disc levels: Severe degenerative disc disease is noted at C4-5, C5-6, C6-7 and C7-T1. Upper chest: Not visualized. Other: None. IMPRESSION: Mild chronic ischemic white matter disease. No acute intracranial abnormality seen. Postsurgical changes involving the right middle terminate and maxillary sinus. No acute abnormality seen in maxillofacial region. Severe multilevel degenerative disc disease. No acute abnormality seen in the cervical spine. Electronically  Signed   By: Marijo Conception, M.D.   On: 01/06/2017 08:03   Ct Cervical Spine Wo Contrast  Result Date: 01/06/2017 CLINICAL DATA:  Facial injury. EXAM: CT HEAD WITHOUT CONTRAST CT MAXILLOFACIAL WITHOUT CONTRAST CT CERVICAL SPINE WITHOUT CONTRAST TECHNIQUE: Multidetector CT imaging of the head, cervical spine, and maxillofacial structures were performed using the standard protocol without intravenous contrast. Multiplanar CT image reconstructions of the cervical spine and maxillofacial structures were also generated. COMPARISON:  CT scan of September 26, 2016. FINDINGS: CT HEAD FINDINGS Brain: Mild chronic ischemic white matter disease is noted. No mass effect or midline shift is noted. Ventricular size is within normal limits. There is no evidence of mass lesion, hemorrhage or acute infarction. Vascular: Atherosclerosis of carotid siphons is noted. Skull: Normal. Negative for fracture or focal lesion. Other: None. CT MAXILLOFACIAL FINDINGS Osseous: Status post surgical resection of right middle turbinate and antrostomy. No fracture is noted. Orbits: Negative. No traumatic or inflammatory finding. Sinuses: No evidence of sinusitis. Postsurgical changes of right maxillary sinus as described above. Soft tissues: Negative. CT CERVICAL SPINE FINDINGS Alignment: Grade 1 anterolisthesis of C4-5 is noted secondary to posterior facet joint hypertrophy. Grade 1 retrolisthesis of C5-6 is noted secondary to severe degenerative disc disease at this level. Grade 1 anterolisthesis of C7-T1 as secondary to posterior facet joint hypertrophy. Skull base and vertebrae: No acute fracture. No primary bone lesion or focal pathologic process. Soft tissues and spinal canal: No prevertebral fluid or swelling. No visible canal hematoma. Disc levels: Severe degenerative disc disease is noted at C4-5, C5-6, C6-7 and C7-T1. Upper chest: Not visualized. Other: None. IMPRESSION: Mild chronic ischemic white matter disease. No acute intracranial  abnormality seen. Postsurgical changes involving the right middle terminate and maxillary sinus. No acute abnormality seen in maxillofacial region. Severe multilevel degenerative disc disease. No acute abnormality seen in the cervical spine. Electronically Signed   By: Marijo Conception, M.D.   On: 01/06/2017 08:03   Ct Maxillofacial Wo Contrast  Result Date: 01/06/2017 CLINICAL DATA:  Facial injury. EXAM: CT HEAD WITHOUT CONTRAST CT MAXILLOFACIAL WITHOUT CONTRAST CT CERVICAL SPINE WITHOUT CONTRAST TECHNIQUE: Multidetector CT imaging of the head, cervical spine, and maxillofacial structures were performed using the standard protocol without intravenous contrast.  Multiplanar CT image reconstructions of the cervical spine and maxillofacial structures were also generated. COMPARISON:  CT scan of September 26, 2016. FINDINGS: CT HEAD FINDINGS Brain: Mild chronic ischemic white matter disease is noted. No mass effect or midline shift is noted. Ventricular size is within normal limits. There is no evidence of mass lesion, hemorrhage or acute infarction. Vascular: Atherosclerosis of carotid siphons is noted. Skull: Normal. Negative for fracture or focal lesion. Other: None. CT MAXILLOFACIAL FINDINGS Osseous: Status post surgical resection of right middle turbinate and antrostomy. No fracture is noted. Orbits: Negative. No traumatic or inflammatory finding. Sinuses: No evidence of sinusitis. Postsurgical changes of right maxillary sinus as described above. Soft tissues: Negative. CT CERVICAL SPINE FINDINGS Alignment: Grade 1 anterolisthesis of C4-5 is noted secondary to posterior facet joint hypertrophy. Grade 1 retrolisthesis of C5-6 is noted secondary to severe degenerative disc disease at this level. Grade 1 anterolisthesis of C7-T1 as secondary to posterior facet joint hypertrophy. Skull base and vertebrae: No acute fracture. No primary bone lesion or focal pathologic process. Soft tissues and spinal canal: No  prevertebral fluid or swelling. No visible canal hematoma. Disc levels: Severe degenerative disc disease is noted at C4-5, C5-6, C6-7 and C7-T1. Upper chest: Not visualized. Other: None. IMPRESSION: Mild chronic ischemic white matter disease. No acute intracranial abnormality seen. Postsurgical changes involving the right middle terminate and maxillary sinus. No acute abnormality seen in maxillofacial region. Severe multilevel degenerative disc disease. No acute abnormality seen in the cervical spine. Electronically Signed   By: Marijo Conception, M.D.   On: 01/06/2017 08:03    Assessment/Plan  Alzheimer's disease with behavioral disturbance Advanced, provide supportive care. Continue quetiapine 100 mg qhs and monitor. Fall precautions. Pressure ulcer prophylaxis. Continue lorazepam scheduled and on need basis.   Hypertension Continue aliskiren 150 mg daily and aspirin ec 81 mg daily. Monitor BP  Protein calorie malnutrition RD to evaluate. Monitor po intake and weight. Decline anticipated. Continue feeding supplement  Depression and anxiety Continue duloxetine 60 mg daily and olanzapine 5 mg daily. Continue lorazepam 0.5 mg tid with 0.5 mg q4h prn and monitor.   Cerebral amyloid angiopathy Followed by neurology, supportive care for now  Calculus of kidney Asymptomatic. Continue tramadol 50 mg q6h prn pain and monitor  Insomnia Continue melatonin 5 mg qhs with trazodone 150 mg qhs and monitor her sleep cycle  History of IBS Stable, continue prn imodium and colace as needed.   Hyperlipidemia c/w atorvastatin  Recurrent UTI Maintain hydration and perineal hygiene, monitor, continue cranberry supplement  Osteopenia Fall precautions. Continue estradiol tablet but change this to every other day from daily after chart review of notes from Dr Brigitte Pulse from 01/03/17 visit.     Goals of care: long term care   Labs/tests ordered: cbc, bmp 01/20/17  Family/ staff Communication: reviewed care  plan with patient, her husband and nursing supervisor    Blanchie Serve, MD Internal Medicine [place], [place] [address] Cell Phone (Monday-Friday 8 am - 5 pm): [phone] On Call: [phone] and follow prompts after 5 pm and on weekends Office Phone: [phone] Office Fax: [phone]

## 2017-01-20 DIAGNOSIS — E46 Unspecified protein-calorie malnutrition: Secondary | ICD-10-CM | POA: Diagnosis not present

## 2017-01-20 DIAGNOSIS — F05 Delirium due to known physiological condition: Secondary | ICD-10-CM | POA: Diagnosis not present

## 2017-01-20 LAB — HEPATIC FUNCTION PANEL
ALT: 12 U/L (ref 7–35)
AST: 19 U/L (ref 13–35)
Alkaline Phosphatase: 54 U/L (ref 25–125)
Bilirubin, Total: 0.8 mg/dL

## 2017-01-20 LAB — CBC AND DIFFERENTIAL
HCT: 44 % (ref 36–46)
Hemoglobin: 14.2 g/dL (ref 12.0–16.0)
Platelets: 184 10*3/uL (ref 150–399)
WBC: 7.5 10^3/mL

## 2017-01-20 LAB — BASIC METABOLIC PANEL
BUN: 13 mg/dL (ref 4–21)
Creatinine: 0.8 mg/dL (ref 0.5–1.1)
Glucose: 95 mg/dL
Potassium: 4.4 mmol/L (ref 3.4–5.3)
Sodium: 142 mmol/L (ref 137–147)

## 2017-01-21 DIAGNOSIS — H409 Unspecified glaucoma: Secondary | ICD-10-CM | POA: Diagnosis not present

## 2017-01-21 DIAGNOSIS — I680 Cerebral amyloid angiopathy: Secondary | ICD-10-CM | POA: Diagnosis not present

## 2017-01-21 DIAGNOSIS — R278 Other lack of coordination: Secondary | ICD-10-CM | POA: Diagnosis not present

## 2017-01-21 DIAGNOSIS — R488 Other symbolic dysfunctions: Secondary | ICD-10-CM | POA: Diagnosis not present

## 2017-01-21 DIAGNOSIS — H5340 Unspecified visual field defects: Secondary | ICD-10-CM | POA: Diagnosis not present

## 2017-01-21 DIAGNOSIS — G309 Alzheimer's disease, unspecified: Secondary | ICD-10-CM | POA: Diagnosis not present

## 2017-01-21 DIAGNOSIS — F0150 Vascular dementia without behavioral disturbance: Secondary | ICD-10-CM | POA: Diagnosis not present

## 2017-01-22 DIAGNOSIS — H5340 Unspecified visual field defects: Secondary | ICD-10-CM | POA: Diagnosis not present

## 2017-01-22 DIAGNOSIS — H409 Unspecified glaucoma: Secondary | ICD-10-CM | POA: Diagnosis not present

## 2017-01-22 DIAGNOSIS — G309 Alzheimer's disease, unspecified: Secondary | ICD-10-CM | POA: Diagnosis not present

## 2017-01-22 DIAGNOSIS — R488 Other symbolic dysfunctions: Secondary | ICD-10-CM | POA: Diagnosis not present

## 2017-01-22 DIAGNOSIS — F0150 Vascular dementia without behavioral disturbance: Secondary | ICD-10-CM | POA: Diagnosis not present

## 2017-01-22 DIAGNOSIS — R278 Other lack of coordination: Secondary | ICD-10-CM | POA: Diagnosis not present

## 2017-01-23 DIAGNOSIS — H409 Unspecified glaucoma: Secondary | ICD-10-CM | POA: Diagnosis not present

## 2017-01-23 DIAGNOSIS — R278 Other lack of coordination: Secondary | ICD-10-CM | POA: Diagnosis not present

## 2017-01-23 DIAGNOSIS — F0150 Vascular dementia without behavioral disturbance: Secondary | ICD-10-CM | POA: Diagnosis not present

## 2017-01-23 DIAGNOSIS — H5340 Unspecified visual field defects: Secondary | ICD-10-CM | POA: Diagnosis not present

## 2017-01-23 DIAGNOSIS — R488 Other symbolic dysfunctions: Secondary | ICD-10-CM | POA: Diagnosis not present

## 2017-01-23 DIAGNOSIS — G309 Alzheimer's disease, unspecified: Secondary | ICD-10-CM | POA: Diagnosis not present

## 2017-01-24 DIAGNOSIS — R488 Other symbolic dysfunctions: Secondary | ICD-10-CM | POA: Diagnosis not present

## 2017-01-24 DIAGNOSIS — H409 Unspecified glaucoma: Secondary | ICD-10-CM | POA: Diagnosis not present

## 2017-01-24 DIAGNOSIS — H5340 Unspecified visual field defects: Secondary | ICD-10-CM | POA: Diagnosis not present

## 2017-01-24 DIAGNOSIS — G309 Alzheimer's disease, unspecified: Secondary | ICD-10-CM | POA: Diagnosis not present

## 2017-01-24 DIAGNOSIS — R278 Other lack of coordination: Secondary | ICD-10-CM | POA: Diagnosis not present

## 2017-01-24 DIAGNOSIS — F0150 Vascular dementia without behavioral disturbance: Secondary | ICD-10-CM | POA: Diagnosis not present

## 2017-01-27 DIAGNOSIS — F0150 Vascular dementia without behavioral disturbance: Secondary | ICD-10-CM | POA: Diagnosis not present

## 2017-01-27 DIAGNOSIS — G309 Alzheimer's disease, unspecified: Secondary | ICD-10-CM | POA: Diagnosis not present

## 2017-01-27 DIAGNOSIS — H409 Unspecified glaucoma: Secondary | ICD-10-CM | POA: Diagnosis not present

## 2017-01-27 DIAGNOSIS — R488 Other symbolic dysfunctions: Secondary | ICD-10-CM | POA: Diagnosis not present

## 2017-01-27 DIAGNOSIS — H5340 Unspecified visual field defects: Secondary | ICD-10-CM | POA: Diagnosis not present

## 2017-01-27 DIAGNOSIS — R278 Other lack of coordination: Secondary | ICD-10-CM | POA: Diagnosis not present

## 2017-01-28 ENCOUNTER — Encounter: Payer: Self-pay | Admitting: Internal Medicine

## 2017-01-28 ENCOUNTER — Non-Acute Institutional Stay: Payer: Medicare Other | Admitting: Internal Medicine

## 2017-01-28 DIAGNOSIS — E7800 Pure hypercholesterolemia, unspecified: Secondary | ICD-10-CM

## 2017-01-28 DIAGNOSIS — H409 Unspecified glaucoma: Secondary | ICD-10-CM | POA: Diagnosis not present

## 2017-01-28 DIAGNOSIS — E440 Moderate protein-calorie malnutrition: Secondary | ICD-10-CM

## 2017-01-28 DIAGNOSIS — I10 Essential (primary) hypertension: Secondary | ICD-10-CM

## 2017-01-28 DIAGNOSIS — E210 Primary hyperparathyroidism: Secondary | ICD-10-CM | POA: Diagnosis not present

## 2017-01-28 DIAGNOSIS — R278 Other lack of coordination: Secondary | ICD-10-CM | POA: Diagnosis not present

## 2017-01-28 DIAGNOSIS — Z8719 Personal history of other diseases of the digestive system: Secondary | ICD-10-CM

## 2017-01-28 DIAGNOSIS — Q6689 Other  specified congenital deformities of feet: Secondary | ICD-10-CM | POA: Diagnosis not present

## 2017-01-28 DIAGNOSIS — R4701 Aphasia: Secondary | ICD-10-CM | POA: Diagnosis not present

## 2017-01-28 DIAGNOSIS — R488 Other symbolic dysfunctions: Secondary | ICD-10-CM | POA: Diagnosis not present

## 2017-01-28 DIAGNOSIS — F323 Major depressive disorder, single episode, severe with psychotic features: Secondary | ICD-10-CM

## 2017-01-28 DIAGNOSIS — I680 Cerebral amyloid angiopathy: Secondary | ICD-10-CM | POA: Diagnosis not present

## 2017-01-28 DIAGNOSIS — M8580 Other specified disorders of bone density and structure, unspecified site: Secondary | ICD-10-CM | POA: Diagnosis not present

## 2017-01-28 DIAGNOSIS — G301 Alzheimer's disease with late onset: Secondary | ICD-10-CM

## 2017-01-28 DIAGNOSIS — F0281 Dementia in other diseases classified elsewhere with behavioral disturbance: Secondary | ICD-10-CM | POA: Diagnosis not present

## 2017-01-28 DIAGNOSIS — H5340 Unspecified visual field defects: Secondary | ICD-10-CM | POA: Diagnosis not present

## 2017-01-28 DIAGNOSIS — L602 Onychogryphosis: Secondary | ICD-10-CM | POA: Diagnosis not present

## 2017-01-28 DIAGNOSIS — F22 Delusional disorders: Secondary | ICD-10-CM | POA: Diagnosis not present

## 2017-01-28 DIAGNOSIS — F0150 Vascular dementia without behavioral disturbance: Secondary | ICD-10-CM | POA: Diagnosis not present

## 2017-01-28 DIAGNOSIS — F02818 Dementia in other diseases classified elsewhere, unspecified severity, with other behavioral disturbance: Secondary | ICD-10-CM

## 2017-01-28 DIAGNOSIS — L84 Corns and callosities: Secondary | ICD-10-CM | POA: Diagnosis not present

## 2017-01-28 DIAGNOSIS — G309 Alzheimer's disease, unspecified: Secondary | ICD-10-CM | POA: Diagnosis not present

## 2017-01-28 NOTE — Progress Notes (Signed)
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 -cont supplement bid  10. 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 Pure hypercholesterolemia -was taken off statin therapy by PCP due to prognosis and lack of benefit in the short term  Family/ staff Communication: discussed with memory care nursing  Labs/tests ordered:  No new today, but should have iCa and PTH in near future  Colby Reels L. Orene Abbasi, D.O. [facility] [address], [place] [address] Cell Phone (Mon-Fri 8am-5pm):  [phone] On Call:  [phone] & follow prompts after 5pm &  weekends Office Phone:  [phone] Office Fax:  [phone]

## 2017-01-29 DIAGNOSIS — R488 Other symbolic dysfunctions: Secondary | ICD-10-CM | POA: Diagnosis not present

## 2017-01-29 DIAGNOSIS — H409 Unspecified glaucoma: Secondary | ICD-10-CM | POA: Diagnosis not present

## 2017-01-29 DIAGNOSIS — G309 Alzheimer's disease, unspecified: Secondary | ICD-10-CM | POA: Diagnosis not present

## 2017-01-29 DIAGNOSIS — F0150 Vascular dementia without behavioral disturbance: Secondary | ICD-10-CM | POA: Diagnosis not present

## 2017-01-29 DIAGNOSIS — H5340 Unspecified visual field defects: Secondary | ICD-10-CM | POA: Diagnosis not present

## 2017-01-29 DIAGNOSIS — R278 Other lack of coordination: Secondary | ICD-10-CM | POA: Diagnosis not present

## 2017-01-30 DIAGNOSIS — H409 Unspecified glaucoma: Secondary | ICD-10-CM | POA: Diagnosis not present

## 2017-01-30 DIAGNOSIS — H5340 Unspecified visual field defects: Secondary | ICD-10-CM | POA: Diagnosis not present

## 2017-01-30 DIAGNOSIS — R488 Other symbolic dysfunctions: Secondary | ICD-10-CM | POA: Diagnosis not present

## 2017-01-30 DIAGNOSIS — F0150 Vascular dementia without behavioral disturbance: Secondary | ICD-10-CM | POA: Diagnosis not present

## 2017-01-30 DIAGNOSIS — G309 Alzheimer's disease, unspecified: Secondary | ICD-10-CM | POA: Diagnosis not present

## 2017-01-30 DIAGNOSIS — R278 Other lack of coordination: Secondary | ICD-10-CM | POA: Diagnosis not present

## 2017-01-30 DIAGNOSIS — F331 Major depressive disorder, recurrent, moderate: Secondary | ICD-10-CM | POA: Diagnosis not present

## 2017-01-31 DIAGNOSIS — H409 Unspecified glaucoma: Secondary | ICD-10-CM | POA: Diagnosis not present

## 2017-01-31 DIAGNOSIS — F0150 Vascular dementia without behavioral disturbance: Secondary | ICD-10-CM | POA: Diagnosis not present

## 2017-01-31 DIAGNOSIS — R278 Other lack of coordination: Secondary | ICD-10-CM | POA: Diagnosis not present

## 2017-01-31 DIAGNOSIS — R488 Other symbolic dysfunctions: Secondary | ICD-10-CM | POA: Diagnosis not present

## 2017-01-31 DIAGNOSIS — G309 Alzheimer's disease, unspecified: Secondary | ICD-10-CM | POA: Diagnosis not present

## 2017-01-31 DIAGNOSIS — H5340 Unspecified visual field defects: Secondary | ICD-10-CM | POA: Diagnosis not present

## 2017-02-03 DIAGNOSIS — R278 Other lack of coordination: Secondary | ICD-10-CM | POA: Diagnosis not present

## 2017-02-03 DIAGNOSIS — H5340 Unspecified visual field defects: Secondary | ICD-10-CM | POA: Diagnosis not present

## 2017-02-03 DIAGNOSIS — H409 Unspecified glaucoma: Secondary | ICD-10-CM | POA: Diagnosis not present

## 2017-02-03 DIAGNOSIS — G309 Alzheimer's disease, unspecified: Secondary | ICD-10-CM | POA: Diagnosis not present

## 2017-02-03 DIAGNOSIS — F0150 Vascular dementia without behavioral disturbance: Secondary | ICD-10-CM | POA: Diagnosis not present

## 2017-02-03 DIAGNOSIS — R488 Other symbolic dysfunctions: Secondary | ICD-10-CM | POA: Diagnosis not present

## 2017-02-04 DIAGNOSIS — H409 Unspecified glaucoma: Secondary | ICD-10-CM | POA: Diagnosis not present

## 2017-02-04 DIAGNOSIS — G309 Alzheimer's disease, unspecified: Secondary | ICD-10-CM | POA: Diagnosis not present

## 2017-02-04 DIAGNOSIS — R278 Other lack of coordination: Secondary | ICD-10-CM | POA: Diagnosis not present

## 2017-02-04 DIAGNOSIS — R488 Other symbolic dysfunctions: Secondary | ICD-10-CM | POA: Diagnosis not present

## 2017-02-04 DIAGNOSIS — H5340 Unspecified visual field defects: Secondary | ICD-10-CM | POA: Diagnosis not present

## 2017-02-04 DIAGNOSIS — F0150 Vascular dementia without behavioral disturbance: Secondary | ICD-10-CM | POA: Diagnosis not present

## 2017-02-05 DIAGNOSIS — H409 Unspecified glaucoma: Secondary | ICD-10-CM | POA: Diagnosis not present

## 2017-02-05 DIAGNOSIS — R278 Other lack of coordination: Secondary | ICD-10-CM | POA: Diagnosis not present

## 2017-02-05 DIAGNOSIS — R488 Other symbolic dysfunctions: Secondary | ICD-10-CM | POA: Diagnosis not present

## 2017-02-05 DIAGNOSIS — F0150 Vascular dementia without behavioral disturbance: Secondary | ICD-10-CM | POA: Diagnosis not present

## 2017-02-05 DIAGNOSIS — G309 Alzheimer's disease, unspecified: Secondary | ICD-10-CM | POA: Diagnosis not present

## 2017-02-05 DIAGNOSIS — H5340 Unspecified visual field defects: Secondary | ICD-10-CM | POA: Diagnosis not present

## 2017-02-06 DIAGNOSIS — F0150 Vascular dementia without behavioral disturbance: Secondary | ICD-10-CM | POA: Diagnosis not present

## 2017-02-06 DIAGNOSIS — H409 Unspecified glaucoma: Secondary | ICD-10-CM | POA: Diagnosis not present

## 2017-02-06 DIAGNOSIS — G309 Alzheimer's disease, unspecified: Secondary | ICD-10-CM | POA: Diagnosis not present

## 2017-02-06 DIAGNOSIS — H5340 Unspecified visual field defects: Secondary | ICD-10-CM | POA: Diagnosis not present

## 2017-02-06 DIAGNOSIS — R278 Other lack of coordination: Secondary | ICD-10-CM | POA: Diagnosis not present

## 2017-02-06 DIAGNOSIS — R488 Other symbolic dysfunctions: Secondary | ICD-10-CM | POA: Diagnosis not present

## 2017-02-10 DIAGNOSIS — F0150 Vascular dementia without behavioral disturbance: Secondary | ICD-10-CM | POA: Diagnosis not present

## 2017-02-10 DIAGNOSIS — R278 Other lack of coordination: Secondary | ICD-10-CM | POA: Diagnosis not present

## 2017-02-10 DIAGNOSIS — G309 Alzheimer's disease, unspecified: Secondary | ICD-10-CM | POA: Diagnosis not present

## 2017-02-10 DIAGNOSIS — H5340 Unspecified visual field defects: Secondary | ICD-10-CM | POA: Diagnosis not present

## 2017-02-10 DIAGNOSIS — R488 Other symbolic dysfunctions: Secondary | ICD-10-CM | POA: Diagnosis not present

## 2017-02-10 DIAGNOSIS — H409 Unspecified glaucoma: Secondary | ICD-10-CM | POA: Diagnosis not present

## 2017-02-11 DIAGNOSIS — R278 Other lack of coordination: Secondary | ICD-10-CM | POA: Diagnosis not present

## 2017-02-11 DIAGNOSIS — F0150 Vascular dementia without behavioral disturbance: Secondary | ICD-10-CM | POA: Diagnosis not present

## 2017-02-11 DIAGNOSIS — H409 Unspecified glaucoma: Secondary | ICD-10-CM | POA: Diagnosis not present

## 2017-02-11 DIAGNOSIS — R488 Other symbolic dysfunctions: Secondary | ICD-10-CM | POA: Diagnosis not present

## 2017-02-11 DIAGNOSIS — G309 Alzheimer's disease, unspecified: Secondary | ICD-10-CM | POA: Diagnosis not present

## 2017-02-11 DIAGNOSIS — H5340 Unspecified visual field defects: Secondary | ICD-10-CM | POA: Diagnosis not present

## 2017-02-12 DIAGNOSIS — H5340 Unspecified visual field defects: Secondary | ICD-10-CM | POA: Diagnosis not present

## 2017-02-12 DIAGNOSIS — R488 Other symbolic dysfunctions: Secondary | ICD-10-CM | POA: Diagnosis not present

## 2017-02-12 DIAGNOSIS — H409 Unspecified glaucoma: Secondary | ICD-10-CM | POA: Diagnosis not present

## 2017-02-12 DIAGNOSIS — G309 Alzheimer's disease, unspecified: Secondary | ICD-10-CM | POA: Diagnosis not present

## 2017-02-12 DIAGNOSIS — R278 Other lack of coordination: Secondary | ICD-10-CM | POA: Diagnosis not present

## 2017-02-12 DIAGNOSIS — F0150 Vascular dementia without behavioral disturbance: Secondary | ICD-10-CM | POA: Diagnosis not present

## 2017-02-13 DIAGNOSIS — H409 Unspecified glaucoma: Secondary | ICD-10-CM | POA: Diagnosis not present

## 2017-02-13 DIAGNOSIS — R278 Other lack of coordination: Secondary | ICD-10-CM | POA: Diagnosis not present

## 2017-02-13 DIAGNOSIS — R488 Other symbolic dysfunctions: Secondary | ICD-10-CM | POA: Diagnosis not present

## 2017-02-13 DIAGNOSIS — F0150 Vascular dementia without behavioral disturbance: Secondary | ICD-10-CM | POA: Diagnosis not present

## 2017-02-13 DIAGNOSIS — H5340 Unspecified visual field defects: Secondary | ICD-10-CM | POA: Diagnosis not present

## 2017-02-13 DIAGNOSIS — G309 Alzheimer's disease, unspecified: Secondary | ICD-10-CM | POA: Diagnosis not present

## 2017-02-14 DIAGNOSIS — R278 Other lack of coordination: Secondary | ICD-10-CM | POA: Diagnosis not present

## 2017-02-14 DIAGNOSIS — R488 Other symbolic dysfunctions: Secondary | ICD-10-CM | POA: Diagnosis not present

## 2017-02-14 DIAGNOSIS — H409 Unspecified glaucoma: Secondary | ICD-10-CM | POA: Diagnosis not present

## 2017-02-14 DIAGNOSIS — I680 Cerebral amyloid angiopathy: Secondary | ICD-10-CM | POA: Diagnosis not present

## 2017-02-14 DIAGNOSIS — H5340 Unspecified visual field defects: Secondary | ICD-10-CM | POA: Diagnosis not present

## 2017-02-14 DIAGNOSIS — F0150 Vascular dementia without behavioral disturbance: Secondary | ICD-10-CM | POA: Diagnosis not present

## 2017-02-14 DIAGNOSIS — G309 Alzheimer's disease, unspecified: Secondary | ICD-10-CM | POA: Diagnosis not present

## 2017-02-17 DIAGNOSIS — H5340 Unspecified visual field defects: Secondary | ICD-10-CM | POA: Diagnosis not present

## 2017-02-17 DIAGNOSIS — F0150 Vascular dementia without behavioral disturbance: Secondary | ICD-10-CM | POA: Diagnosis not present

## 2017-02-17 DIAGNOSIS — R278 Other lack of coordination: Secondary | ICD-10-CM | POA: Diagnosis not present

## 2017-02-17 DIAGNOSIS — H409 Unspecified glaucoma: Secondary | ICD-10-CM | POA: Diagnosis not present

## 2017-02-17 DIAGNOSIS — G309 Alzheimer's disease, unspecified: Secondary | ICD-10-CM | POA: Diagnosis not present

## 2017-02-17 DIAGNOSIS — R488 Other symbolic dysfunctions: Secondary | ICD-10-CM | POA: Diagnosis not present

## 2017-02-18 DIAGNOSIS — H5340 Unspecified visual field defects: Secondary | ICD-10-CM | POA: Diagnosis not present

## 2017-02-18 DIAGNOSIS — R488 Other symbolic dysfunctions: Secondary | ICD-10-CM | POA: Diagnosis not present

## 2017-02-18 DIAGNOSIS — R278 Other lack of coordination: Secondary | ICD-10-CM | POA: Diagnosis not present

## 2017-02-18 DIAGNOSIS — F0150 Vascular dementia without behavioral disturbance: Secondary | ICD-10-CM | POA: Diagnosis not present

## 2017-02-18 DIAGNOSIS — G309 Alzheimer's disease, unspecified: Secondary | ICD-10-CM | POA: Diagnosis not present

## 2017-02-18 DIAGNOSIS — H409 Unspecified glaucoma: Secondary | ICD-10-CM | POA: Diagnosis not present

## 2017-02-19 DIAGNOSIS — H5340 Unspecified visual field defects: Secondary | ICD-10-CM | POA: Diagnosis not present

## 2017-02-19 DIAGNOSIS — G309 Alzheimer's disease, unspecified: Secondary | ICD-10-CM | POA: Diagnosis not present

## 2017-02-19 DIAGNOSIS — F0150 Vascular dementia without behavioral disturbance: Secondary | ICD-10-CM | POA: Diagnosis not present

## 2017-02-19 DIAGNOSIS — R488 Other symbolic dysfunctions: Secondary | ICD-10-CM | POA: Diagnosis not present

## 2017-02-19 DIAGNOSIS — R278 Other lack of coordination: Secondary | ICD-10-CM | POA: Diagnosis not present

## 2017-02-19 DIAGNOSIS — H409 Unspecified glaucoma: Secondary | ICD-10-CM | POA: Diagnosis not present

## 2017-02-20 DIAGNOSIS — F331 Major depressive disorder, recurrent, moderate: Secondary | ICD-10-CM | POA: Diagnosis not present

## 2017-02-21 DIAGNOSIS — H409 Unspecified glaucoma: Secondary | ICD-10-CM | POA: Diagnosis not present

## 2017-02-21 DIAGNOSIS — G309 Alzheimer's disease, unspecified: Secondary | ICD-10-CM | POA: Diagnosis not present

## 2017-02-21 DIAGNOSIS — R278 Other lack of coordination: Secondary | ICD-10-CM | POA: Diagnosis not present

## 2017-02-21 DIAGNOSIS — R488 Other symbolic dysfunctions: Secondary | ICD-10-CM | POA: Diagnosis not present

## 2017-02-21 DIAGNOSIS — F0150 Vascular dementia without behavioral disturbance: Secondary | ICD-10-CM | POA: Diagnosis not present

## 2017-02-21 DIAGNOSIS — H5340 Unspecified visual field defects: Secondary | ICD-10-CM | POA: Diagnosis not present

## 2017-02-24 DIAGNOSIS — H409 Unspecified glaucoma: Secondary | ICD-10-CM | POA: Diagnosis not present

## 2017-02-24 DIAGNOSIS — F0150 Vascular dementia without behavioral disturbance: Secondary | ICD-10-CM | POA: Diagnosis not present

## 2017-02-24 DIAGNOSIS — R488 Other symbolic dysfunctions: Secondary | ICD-10-CM | POA: Diagnosis not present

## 2017-02-24 DIAGNOSIS — R278 Other lack of coordination: Secondary | ICD-10-CM | POA: Diagnosis not present

## 2017-02-24 DIAGNOSIS — H5340 Unspecified visual field defects: Secondary | ICD-10-CM | POA: Diagnosis not present

## 2017-02-24 DIAGNOSIS — G309 Alzheimer's disease, unspecified: Secondary | ICD-10-CM | POA: Diagnosis not present

## 2017-02-25 DIAGNOSIS — H409 Unspecified glaucoma: Secondary | ICD-10-CM | POA: Diagnosis not present

## 2017-02-25 DIAGNOSIS — G309 Alzheimer's disease, unspecified: Secondary | ICD-10-CM | POA: Diagnosis not present

## 2017-02-25 DIAGNOSIS — H5340 Unspecified visual field defects: Secondary | ICD-10-CM | POA: Diagnosis not present

## 2017-02-25 DIAGNOSIS — R278 Other lack of coordination: Secondary | ICD-10-CM | POA: Diagnosis not present

## 2017-02-25 DIAGNOSIS — F0150 Vascular dementia without behavioral disturbance: Secondary | ICD-10-CM | POA: Diagnosis not present

## 2017-02-25 DIAGNOSIS — R488 Other symbolic dysfunctions: Secondary | ICD-10-CM | POA: Diagnosis not present

## 2017-02-26 DIAGNOSIS — H409 Unspecified glaucoma: Secondary | ICD-10-CM | POA: Diagnosis not present

## 2017-02-26 DIAGNOSIS — G309 Alzheimer's disease, unspecified: Secondary | ICD-10-CM | POA: Diagnosis not present

## 2017-02-26 DIAGNOSIS — R488 Other symbolic dysfunctions: Secondary | ICD-10-CM | POA: Diagnosis not present

## 2017-02-26 DIAGNOSIS — R278 Other lack of coordination: Secondary | ICD-10-CM | POA: Diagnosis not present

## 2017-02-26 DIAGNOSIS — F0150 Vascular dementia without behavioral disturbance: Secondary | ICD-10-CM | POA: Diagnosis not present

## 2017-02-26 DIAGNOSIS — H5340 Unspecified visual field defects: Secondary | ICD-10-CM | POA: Diagnosis not present

## 2017-02-28 DIAGNOSIS — G309 Alzheimer's disease, unspecified: Secondary | ICD-10-CM | POA: Diagnosis not present

## 2017-02-28 DIAGNOSIS — H409 Unspecified glaucoma: Secondary | ICD-10-CM | POA: Diagnosis not present

## 2017-02-28 DIAGNOSIS — R488 Other symbolic dysfunctions: Secondary | ICD-10-CM | POA: Diagnosis not present

## 2017-02-28 DIAGNOSIS — R278 Other lack of coordination: Secondary | ICD-10-CM | POA: Diagnosis not present

## 2017-02-28 DIAGNOSIS — F0150 Vascular dementia without behavioral disturbance: Secondary | ICD-10-CM | POA: Diagnosis not present

## 2017-02-28 DIAGNOSIS — H5340 Unspecified visual field defects: Secondary | ICD-10-CM | POA: Diagnosis not present

## 2017-03-04 DIAGNOSIS — F0150 Vascular dementia without behavioral disturbance: Secondary | ICD-10-CM | POA: Diagnosis not present

## 2017-03-04 DIAGNOSIS — G309 Alzheimer's disease, unspecified: Secondary | ICD-10-CM | POA: Diagnosis not present

## 2017-03-04 DIAGNOSIS — H5340 Unspecified visual field defects: Secondary | ICD-10-CM | POA: Diagnosis not present

## 2017-03-04 DIAGNOSIS — R278 Other lack of coordination: Secondary | ICD-10-CM | POA: Diagnosis not present

## 2017-03-04 DIAGNOSIS — R488 Other symbolic dysfunctions: Secondary | ICD-10-CM | POA: Diagnosis not present

## 2017-03-04 DIAGNOSIS — H409 Unspecified glaucoma: Secondary | ICD-10-CM | POA: Diagnosis not present

## 2017-03-05 ENCOUNTER — Other Ambulatory Visit: Payer: Self-pay | Admitting: *Deleted

## 2017-03-05 DIAGNOSIS — R278 Other lack of coordination: Secondary | ICD-10-CM | POA: Diagnosis not present

## 2017-03-05 DIAGNOSIS — G309 Alzheimer's disease, unspecified: Secondary | ICD-10-CM | POA: Diagnosis not present

## 2017-03-05 DIAGNOSIS — H5340 Unspecified visual field defects: Secondary | ICD-10-CM | POA: Diagnosis not present

## 2017-03-05 DIAGNOSIS — F0150 Vascular dementia without behavioral disturbance: Secondary | ICD-10-CM | POA: Diagnosis not present

## 2017-03-05 DIAGNOSIS — R488 Other symbolic dysfunctions: Secondary | ICD-10-CM | POA: Diagnosis not present

## 2017-03-05 DIAGNOSIS — H409 Unspecified glaucoma: Secondary | ICD-10-CM | POA: Diagnosis not present

## 2017-03-05 MED ORDER — ALISKIREN FUMARATE 150 MG PO TABS: 75.0000 mg | ORAL_TABLET | Freq: Every day | ORAL | 3 refills | Status: DC

## 2017-03-06 DIAGNOSIS — H5340 Unspecified visual field defects: Secondary | ICD-10-CM | POA: Diagnosis not present

## 2017-03-06 DIAGNOSIS — G309 Alzheimer's disease, unspecified: Secondary | ICD-10-CM | POA: Diagnosis not present

## 2017-03-06 DIAGNOSIS — R278 Other lack of coordination: Secondary | ICD-10-CM | POA: Diagnosis not present

## 2017-03-06 DIAGNOSIS — F0150 Vascular dementia without behavioral disturbance: Secondary | ICD-10-CM | POA: Diagnosis not present

## 2017-03-06 DIAGNOSIS — H409 Unspecified glaucoma: Secondary | ICD-10-CM | POA: Diagnosis not present

## 2017-03-06 DIAGNOSIS — R488 Other symbolic dysfunctions: Secondary | ICD-10-CM | POA: Diagnosis not present

## 2017-03-07 DIAGNOSIS — G309 Alzheimer's disease, unspecified: Secondary | ICD-10-CM | POA: Diagnosis not present

## 2017-03-07 DIAGNOSIS — R488 Other symbolic dysfunctions: Secondary | ICD-10-CM | POA: Diagnosis not present

## 2017-03-07 DIAGNOSIS — R278 Other lack of coordination: Secondary | ICD-10-CM | POA: Diagnosis not present

## 2017-03-07 DIAGNOSIS — H409 Unspecified glaucoma: Secondary | ICD-10-CM | POA: Diagnosis not present

## 2017-03-07 DIAGNOSIS — F0150 Vascular dementia without behavioral disturbance: Secondary | ICD-10-CM | POA: Diagnosis not present

## 2017-03-07 DIAGNOSIS — H5340 Unspecified visual field defects: Secondary | ICD-10-CM | POA: Diagnosis not present

## 2017-03-09 DIAGNOSIS — R509 Fever, unspecified: Secondary | ICD-10-CM | POA: Diagnosis not present

## 2017-03-09 DIAGNOSIS — R05 Cough: Secondary | ICD-10-CM | POA: Diagnosis not present

## 2017-03-10 ENCOUNTER — Non-Acute Institutional Stay: Payer: Medicare Other | Admitting: Adult Health

## 2017-03-10 DIAGNOSIS — R000 Tachycardia, unspecified: Secondary | ICD-10-CM | POA: Diagnosis not present

## 2017-03-10 DIAGNOSIS — Z7189 Other specified counseling: Secondary | ICD-10-CM | POA: Diagnosis not present

## 2017-03-10 DIAGNOSIS — R531 Weakness: Secondary | ICD-10-CM

## 2017-03-10 DIAGNOSIS — R319 Hematuria, unspecified: Secondary | ICD-10-CM | POA: Diagnosis not present

## 2017-03-10 DIAGNOSIS — J181 Lobar pneumonia, unspecified organism: Secondary | ICD-10-CM | POA: Diagnosis not present

## 2017-03-10 DIAGNOSIS — N390 Urinary tract infection, site not specified: Secondary | ICD-10-CM | POA: Diagnosis not present

## 2017-03-10 DIAGNOSIS — D649 Anemia, unspecified: Secondary | ICD-10-CM | POA: Diagnosis not present

## 2017-03-10 DIAGNOSIS — M6281 Muscle weakness (generalized): Secondary | ICD-10-CM | POA: Diagnosis not present

## 2017-03-10 DIAGNOSIS — R509 Fever, unspecified: Secondary | ICD-10-CM | POA: Diagnosis not present

## 2017-03-10 LAB — CBC AND DIFFERENTIAL
HCT: 39 (ref 36–46)
Hemoglobin: 12.8 (ref 12.0–16.0)
Platelets: 156 (ref 150–399)
WBC: 14.1

## 2017-03-10 LAB — BASIC METABOLIC PANEL
BUN: 18 (ref 4–21)
Creatinine: 0.9 (ref 0.5–1.1)
Glucose: 141
Potassium: 3.2 — AB (ref 3.4–5.3)
Sodium: 139 (ref 137–147)

## 2017-03-10 NOTE — Progress Notes (Signed)
Location:  Occupational psychologist of Service:  ALF (13) Provider:   Cindi Carbon, ANP [place] [phone]   Gayland Curry, DO  Patient Care Team: Gayland Curry, DO as PCP - General (Geriatric Medicine) Irine Seal, MD as Attending Physician (Urology) Jodi Marble, MD as Consulting Physician (Otolaryngology) Martinique, Amy, MD as Consulting Physician (Dermatology) Gerda Diss, DO as Consulting Physician (Family Medicine) Deneise Lever, MD as Consulting Physician (Pulmonary Disease) Arta Silence, MD as Consulting Physician (Gastroenterology) Dohmeier, Asencion Partridge, MD as Consulting Physician (Neurology) Geanie Kenning, MD as Consulting Physician (Psychiatry)  Extended Emergency Contact Information Primary Emergency Contact: [name] Address: [address]          [place], [place] [address] [name] of [place] Phone: [phone] Mobile Phone: [phone] Relation: Spouse  Code Status:  DNR Goals of care: Advanced Directive information Advanced Directives 01/28/2017  Does Patient Have a Medical Advance Directive? Yes  Type of Advance Directive Out of facility DNR (pink MOST or yellow form);Living will;Healthcare Power of Attorney  Does patient want to make changes to medical advance directive? -  Copy of [place] in Chart? Yes  Would patient like information on creating a medical advance directive? -  Pre-existing out of facility DNR order (yellow form or pink MOST form) -     Chief Complaint  Patient presents with  . Acute Visit    fever, confusion    HPI:  Pt is a 76 y.o. female seen today for an acute visit for temp of 104.6.  She was first noted to have a cough with congestion two days ago. Temp was 101 on 6/24 and a 1 view CXR was obtained with no acute findings. She has a hx of progressive memory loss and amyloid angiopathy and is not able to provide a hx. She is typically ambulatory and  eats/drinks well but for my visit today she is in bed with her eyes glazed over, not able to follow commands, and has not had anything to eat for breakfast which is apparently a significant change from the day prior. There are no reports of diarrhea or vomiting. No urinary complaints.  Her daughter reports that she was around her grandchildren this weekend and they were both sick with a fever and diarrhea. The daughter reports the resident had diarrhea on Friday but has not had any since then. She was given 650 of tylenol supp and her temp came down to 103. Another CXR was obtained for a 2 view today which showed right middle and right lower lobe pneumonia.  EKG showed sinus rhythm   Past Medical History:  Diagnosis Date  . Arthritis    back and neck  . Carotid artery stenosis   . Cerebral amyloid angiopathy (CODE) 06/20/2016  . Chronic rhinosinusitis   . DDD (degenerative disc disease)   . Depression   . Diverticulosis of colon   . Frequency of urination   . Glaucoma   . Headache disorder 12/13/2014   Right temple  . Hearing loss   . History of kidney stones    MULTIBLE SURGICAL INTERVENTIONS  . History of skin cancer HX TOPICAL FACIAL [place] 2013  . History of TIA (transient ischemic attack)    NOV 2013-- NO RESIDUAL  . Hypertension   . IBS (irritable bowel syndrome)   . Memory difficulty 05/14/2016  . Memory loss   . Mild asthma    COLD INDUCED  . Right  ureteral stone   . Seasonal and perennial allergic rhinitis   . Shingles    EYEBALL 09-17-2013  . Spondylosis    Past Surgical History:  Procedure Laterality Date  . BUNIONECTOMY     BILATERAL  . CALDWELL LUC     sinus drainage procedure  . CYSTO/ BILATERAL URETEROSCOPIC STONE EXTRACTIONS  02-09-2003  . CYSTO/ BILATERAL URETEROSCOPY/  LASER OF STONES LEFT SIDE  06-02-2007  . CYSTOSCOPY/RETROGRADE/URETEROSCOPY/STONE EXTRACTION WITH BASKET  03/31/2012   Procedure: CYSTOSCOPY/RETROGRADE/URETEROSCOPY/STONE EXTRACTION WITH  BASKET;  Surgeon: Malka So, MD;  [facility];  Service: Urology;  Laterality: Right;   . CYSTOSCOPY/RETROGRADE/URETEROSCOPY/STONE EXTRACTION WITH BASKET Right 03/29/2013   Procedure: CYSTOSCOPY//URETEROSCOPY/STONE EXTRACTION WITH BASKET;  Surgeon: Malka So, MD;  Location: [facility];  Service: Urology;  Laterality: Right;  . CYSTOSCOPY/RETROGRADE/URETEROSCOPY/STONE EXTRACTION WITH BASKET Right 10/14/2013   Procedure: CYSTOSCOPY/RETROGRADE/URETEROSCOPY/STONE EXTRACTION WITH BASKET;  Surgeon: Irine Seal, MD;  Location: [facility];  Service: Urology;  Laterality: Right;  . ESOPHAGOGASTRODUODENOSCOPY (EGD) WITH PROPOFOL N/A 04/12/2015   Procedure: ESOPHAGOGASTRODUODENOSCOPY (EGD) WITH PROPOFOL;  Surgeon: Arta Silence, MD;  Location: [facility];  Service: Endoscopy;  Laterality: N/A;  . EUS N/A 04/12/2015   Procedure: ESOPHAGEAL ENDOSCOPIC ULTRASOUND (EUS) RADIAL;  Surgeon: Arta Silence, MD;  Location: [facility];  Service: Endoscopy;  Laterality: N/A;  . EXTRACORPOREAL SHOCK WAVE LITHOTRIPSY  about 5784/ Dr Reece Agar   Never completed procedure-could not tolerate pain. Had seizures due to pain  . EYE SURGERY  AGE 10   INJURY  . HERNIA REPAIR  AGE 89  . HOLMIUM LASER APPLICATION Right 6/96/2952   Procedure: HOLMIUM LASER APPLICATION;  Surgeon: Irine Seal, MD;  Location: [facility];  Service: Urology;  Laterality: Right;  . LAPAROSCOPIC CHOLECYSTECTOMY  01-12-2005  . LEFT URETEROSCOPIC STONE EXTRACTION    LAST ONE 04-28-2009   SEVERAL SURGICAL STONE EXTRACTIONS  . LUMBAR LAMINECTOMY  09-30-2002   L4 - 5;  SPONDYLOLISTHISES W/ STENOSIS AND RADICULOPATHY  . PERCUTANEOUS NEPHROSTOLITHOTOMY  1975  . RIGHT URETEROSCOPIC STONE EXTRACTION  LAST ONE 02-21-2011   MULTIPLE SURGICAL STONE EXTRACTIONS  . TONSILECTOMY, ADENOIDECTOMY, BILATERAL MYRINGOTOMY AND TUBES  CHILD  . VAGINAL HYSTERECTOMY  1991   W/ BILATERAL SALPINGOOPHECTOMY     Allergies  Allergen Reactions  . Donepezil Hcl   . Ace Inhibitors Cough  . Hctz [Hydrochlorothiazide]     Bladder problems  . Triptans     Avoid per neuro    Outpatient Encounter Prescriptions as of 03/10/2017  Medication Sig  . aliskiren (TEKTURNA) 150 MG tablet Take 0.5 tablets (75 mg total) by mouth daily.  Marland Kitchen aspirin EC 81 MG tablet Take 81 mg by mouth daily.  Marland Kitchen atorvastatin (LIPITOR) 10 MG tablet Take 10 mg by mouth at bedtime.   . Cranberry 500 MG TABS Take 1 tablet by mouth daily.  Marland Kitchen docusate sodium (COLACE) 100 MG capsule Take 100 mg by mouth daily as needed for mild constipation or moderate constipation.  . DULoxetine (CYMBALTA) 30 MG capsule Take 60 mg by mouth daily.   Marland Kitchen estradiol (ESTRACE) 1 MG tablet Take 1 mg by mouth daily.  . feeding supplement (BOOST / RESOURCE BREEZE) LIQD Take 1 Container by mouth 2 (two) times daily between meals.  Marland Kitchen loperamide (IMODIUM) 2 MG capsule Take 2 mg by mouth 4 (four) times daily as needed for diarrhea or loose stools.  Marland Kitchen LORazepam (ATIVAN) 1 MG tablet Take 0.5 mg by mouth 3 (three) times daily with meals.  May also take 0.5 mg by mouth every 4 hours as needed for agitation or restlessness  . Melatonin 5 MG CAPS Take 5 mg by mouth at bedtime.  . Multiple Vitamins-Minerals (THERA-M PO) Take 1 tablet by mouth daily.  . traMADol (ULTRAM) 50 MG tablet Take 50 mg by mouth every 6 (six) hours as needed for moderate pain or severe pain.   . [DISCONTINUED] OLANZapine (ZYPREXA) 5 MG tablet Take 5 mg by mouth daily.  . [DISCONTINUED] traZODone (DESYREL) 150 MG tablet Take 150 mg by mouth at bedtime.   No facility-administered encounter medications on file as of 03/10/2017.     Review of Systems  Unable to perform ROS: Acuity of condition    Immunization History  Administered Date(s) Administered  . Hepatitis A 11/15/2003, 06/11/2004, 12/25/2012  . Hepatitis B 11/14/2008, 12/15/2008  . Influenza Split 06/17/2011  . Influenza Whole  06/16/2010  . Influenza, High Dose Seasonal PF 05/27/2016  . Pneumococcal Conjugate-13 08/09/2014  . Pneumococcal Polysaccharide-23 04/22/2011, 11/15/2011  . Tdap 11/14/2008, 12/25/2012  . Typhoid Inactivated 12/25/2012  . Yellow Fever 12/25/2012  . Zoster 05/07/2012  . Zoster Recombinat (Shingrix) 01/03/2017   Pertinent  Health Maintenance Due  Topic Date Due  . COLONOSCOPY  01/31/1993  . DEXA SCAN  02/01/2008  . INFLUENZA VACCINE  04/16/2017  . MAMMOGRAM  07/23/2018  . PNA vac Low Risk Adult  Completed   Fall Risk  10/29/2016  Falls in the past year? Yes  Number falls in past yr: 2 or more  Injury with Fall? Yes  Follow up Falls prevention discussed   Functional Status Survey:    Vitals:   03/10/17 1122  BP: (!) 145/83  Pulse: (!) 110  Resp: 20  Temp: (!) 104.7 F (40.4 C)  SpO2: 90%   There is no height or weight on file to calculate BMI. Physical Exam  Constitutional: No distress.  HENT:  Head: Normocephalic and atraumatic.  Nose: Nose normal.  Bit down on tongue blade, mucous membranes are moist  Neck: No JVD present.  Cardiovascular: Regular rhythm.   No murmur heard. Rate 110  Pulmonary/Chest: Effort normal. No respiratory distress. She has no wheezes.  Scattered rhonchi clears with cough  Abdominal: Soft. Bowel sounds are normal. She exhibits no distension. There is no tenderness. There is no rebound and no guarding.  Musculoskeletal: She exhibits no edema or tenderness.  Neurological:  Not able to follow commands. Asleep but arouses to physical stimuli. No obvious focal deficit. Tremulous.  Skin: Skin is warm. She is diaphoretic.  Nursing note and vitals reviewed.   Labs reviewed:  Recent Labs  07/15/16 0444 08/22/16 1047 09/26/16 2344 01/20/17 0700  NA 139 142 140 142  K 3.9 3.9 4.2 4.4  CL 107 105 108  --   CO2 25 27 26   --   GLUCOSE 110* 89 108*  --   BUN 11 15 17 13   CREATININE 0.95 1.08 1.12* 0.8  CALCIUM 9.2 9.9 9.0  --      Recent Labs  07/14/16 1909 07/15/16 0444 01/20/17 0700  AST 26 22 19   ALT 18 16 12   ALKPHOS 53 45 54  BILITOT 0.7 0.9  --   PROT 6.4* 5.6*  --   ALBUMIN 3.8 3.3*  --     Recent Labs  07/14/16 1909  07/15/16 0444 08/22/16 1047 09/26/16 2344 01/20/17 0700  WBC 7.7  --  6.1 7.6 6.8 7.5  NEUTROABS 4.3  --   --  5.1  --   --   HGB 14.5  < > 13.4 15.2* 13.8 14.2  HCT 42.4  < > 39.7 43.9 40.3 44  MCV 95.7  --  95.4 95.3 93.9  --   PLT 241  --  196 247.0 143* 184  < > = values in this interval not displayed. Lab Results  Component Value Date   TSH 0.832 09/28/2016   Lab Results  Component Value Date   HGBA1C 5.2 07/15/2016   Lab Results  Component Value Date   CHOL 170 07/15/2016   HDL 59 07/15/2016   LDLCALC 90 07/15/2016   TRIG 106 07/15/2016   CHOLHDL 2.9 07/15/2016    Significant Diagnostic Results in last 30 days:  No results found.  Assessment/Plan  1. Lobar pneumonia ([place]) Noted to right middle and RLL on CXR Resident is only intermittently swallowing so will order IVF and IV Levaquin as this is what is readily available at [facility]  IV Levaquin 750 mg x 5 days Florastor 1 cap BID for 10 days if able to swallow ST eval tx ?aspiration when resident is more alert Duoneb q 6 hrs scheduled x 5 days VS qshift x 5 days  Monitor I and O strict  2. Weakness Due to #1, would   3. Tachycardia Improved with dropping temp. Not eating so will start IVF at 100 cc/hr and await labs EKG showed sinus rhythm  4. Advanced care planning/counseling discussion I called and spoke with her husband Jenny Reichmann and her daughter Ander Purpura. I let them know that with a significantly high temp and infection she is at risk for sepsis/death.  They voiced understanding but felt that they would like to avoid hospitalizations if at all possible given her advanced dementia and propensity for agitation. They would like to be kept informed and may still want her hospitalized if things do  not improve. They would like [place] to do what we can within the facility to treat her infection including antibiotics and IVF.    Family/ staff Communication: husband/daughter/staff  Labs/tests ordered:  CBC BMP CXR EKG UA

## 2017-03-11 ENCOUNTER — Encounter: Payer: Self-pay | Admitting: Internal Medicine

## 2017-03-11 ENCOUNTER — Non-Acute Institutional Stay: Payer: Medicare Other | Admitting: Internal Medicine

## 2017-03-11 DIAGNOSIS — R531 Weakness: Secondary | ICD-10-CM | POA: Diagnosis not present

## 2017-03-11 DIAGNOSIS — F02818 Dementia in other diseases classified elsewhere, unspecified severity, with other behavioral disturbance: Secondary | ICD-10-CM

## 2017-03-11 DIAGNOSIS — F0281 Dementia in other diseases classified elsewhere with behavioral disturbance: Secondary | ICD-10-CM | POA: Diagnosis not present

## 2017-03-11 DIAGNOSIS — G309 Alzheimer's disease, unspecified: Secondary | ICD-10-CM | POA: Diagnosis not present

## 2017-03-11 DIAGNOSIS — R1312 Dysphagia, oropharyngeal phase: Secondary | ICD-10-CM | POA: Diagnosis not present

## 2017-03-11 DIAGNOSIS — G301 Alzheimer's disease with late onset: Secondary | ICD-10-CM | POA: Diagnosis not present

## 2017-03-11 DIAGNOSIS — I680 Cerebral amyloid angiopathy: Secondary | ICD-10-CM | POA: Diagnosis not present

## 2017-03-11 DIAGNOSIS — J181 Lobar pneumonia, unspecified organism: Secondary | ICD-10-CM | POA: Diagnosis not present

## 2017-03-11 DIAGNOSIS — R000 Tachycardia, unspecified: Secondary | ICD-10-CM

## 2017-03-11 DIAGNOSIS — H409 Unspecified glaucoma: Secondary | ICD-10-CM | POA: Diagnosis not present

## 2017-03-11 DIAGNOSIS — J690 Pneumonitis due to inhalation of food and vomit: Secondary | ICD-10-CM | POA: Diagnosis not present

## 2017-03-11 DIAGNOSIS — H5340 Unspecified visual field defects: Secondary | ICD-10-CM | POA: Diagnosis not present

## 2017-03-11 DIAGNOSIS — R278 Other lack of coordination: Secondary | ICD-10-CM | POA: Diagnosis not present

## 2017-03-11 NOTE — Progress Notes (Signed)
Patient ID: Ashley [name], female   DOB: 1941/05/30, 76 y.o.   MRN: [id number]  Location:  [facility] Room Number: [place] of Service:  ALF [id number]) Provider:  Gayland Curry, DO  Patient Care Team: Gayland Curry, DO as PCP - General (Geriatric Medicine) Irine Seal, MD as Attending Physician (Urology) Jodi Marble, MD as Consulting Physician (Otolaryngology) Martinique, Amy, MD as Consulting Physician (Dermatology) Gerda Diss, DO as Consulting Physician (Family Medicine) Deneise Lever, MD as Consulting Physician (Pulmonary Disease) Arta Silence, MD as Consulting Physician (Gastroenterology) Dohmeier, Asencion Partridge, MD as Consulting Physician (Neurology) Geanie Kenning, MD as Consulting Physician (Psychiatry)  Extended Emergency Contact Information Primary Emergency Contact: [name] Address: [address]          [place],  [address] [name] of [place] Phone: [phone] Mobile Phone: [phone] Relation: Spouse  Code Status:  DNR Goals of care: Advanced Directive information Advanced Directives 03/11/2017  Does Patient Have a Medical Advance Directive? Yes  Type of Advance Directive Out of facility DNR (pink MOST or yellow form);Living will;Healthcare Power of Attorney  Does patient want to make changes to medical advance directive? -  Copy of [place] in Chart? Yes  Would patient like information on creating a medical advance directive? -  Pre-existing out of facility DNR order (yellow form or pink MOST form) Yellow form placed in chart (order not valid for inpatient use)     Chief Complaint  Patient presents with  . Acute Visit    pneumonia, high fever    HPI:  Pt is a 76 y.o. female with cerebral amyloid angiopathy causing AD, paranoid delusions, expressive aphasia, hyperparathyroidism with h/o kidney stones seen today for an acute visit for follow-up on delirium, fevers  (highest 104.6), pneumonia.  She had been resting in bed, lethargic yesterday.  She received IV levaquin and IVFs. Eventually, she pulled out her IV, but she has begun to eat and drink again.  She is out wandering about.  Her fever has broken.  She is still coughing and appears a bit dyspneic. She's now on po levaquin.  She is getting vitals q shift which has been challenging for staff.  She did permit my exam, but we had difficulty staying in one position.  ST has been consulted and she is on duonebs.  HR remained elevated today in lower 100s.  EKG was NSR yesterday.  No diarrhea noted today (had end of last week).    Past Medical History:  Diagnosis Date  . Arthritis    back and neck  . Carotid artery stenosis   . Cerebral amyloid angiopathy (CODE) 06/20/2016  . Chronic rhinosinusitis   . DDD (degenerative disc disease)   . Depression   . Diverticulosis of colon   . Frequency of urination   . Glaucoma   . Headache disorder 12/13/2014   Right temple  . Hearing loss   . History of kidney stones    MULTIBLE SURGICAL INTERVENTIONS  . History of skin cancer HX TOPICAL FACIAL [place] 2013  . History of TIA (transient ischemic attack)    NOV 2013-- NO RESIDUAL  . Hypertension   . IBS (irritable bowel syndrome)   . Memory difficulty 05/14/2016  . Memory loss   . Mild asthma    COLD INDUCED  . Right ureteral stone   . Seasonal and perennial allergic rhinitis   . Shingles    EYEBALL 09-17-2013  .  Spondylosis    Past Surgical History:  Procedure Laterality Date  . BUNIONECTOMY     BILATERAL  . CALDWELL LUC     sinus drainage procedure  . CYSTO/ BILATERAL URETEROSCOPIC STONE EXTRACTIONS  02-09-2003  . CYSTO/ BILATERAL URETEROSCOPY/  LASER OF STONES LEFT SIDE  06-02-2007  . CYSTOSCOPY/RETROGRADE/URETEROSCOPY/STONE EXTRACTION WITH BASKET  03/31/2012   Procedure: CYSTOSCOPY/RETROGRADE/URETEROSCOPY/STONE EXTRACTION WITH BASKET;  Surgeon: Malka So, MD;  Location: [facility];  Service: Urology;  Laterality: Right;   . CYSTOSCOPY/RETROGRADE/URETEROSCOPY/STONE EXTRACTION WITH BASKET Right 03/29/2013   Procedure: CYSTOSCOPY//URETEROSCOPY/STONE EXTRACTION WITH BASKET;  Surgeon: Malka So, MD;  Location: [facility];  Service: Urology;  Laterality: Right;  . CYSTOSCOPY/RETROGRADE/URETEROSCOPY/STONE EXTRACTION WITH BASKET Right 10/14/2013   Procedure: CYSTOSCOPY/RETROGRADE/URETEROSCOPY/STONE EXTRACTION WITH BASKET;  Surgeon: Irine Seal, MD;  Location: [facility];  Service: Urology;  Laterality: Right;  . ESOPHAGOGASTRODUODENOSCOPY (EGD) WITH PROPOFOL N/A 04/12/2015   Procedure: ESOPHAGOGASTRODUODENOSCOPY (EGD) WITH PROPOFOL;  Surgeon: Arta Silence, MD;  Location: [facility];  Service: Endoscopy;  Laterality: N/A;  . EUS N/A 04/12/2015   Procedure: ESOPHAGEAL ENDOSCOPIC ULTRASOUND (EUS) RADIAL;  Surgeon: Arta Silence, MD;  Location: [facility];  Service: Endoscopy;  Laterality: N/A;  . EXTRACORPOREAL SHOCK WAVE LITHOTRIPSY  about 5102/ Dr Reece Agar   Never completed procedure-could not tolerate pain. Had seizures due to pain  . EYE SURGERY  AGE 21   INJURY  . HERNIA REPAIR  AGE 80  . HOLMIUM LASER APPLICATION Right 5/85/2778   Procedure: HOLMIUM LASER APPLICATION;  Surgeon: Irine Seal, MD;  Location: [facility];  Service: Urology;  Laterality: Right;  . LAPAROSCOPIC CHOLECYSTECTOMY  01-12-2005  . LEFT URETEROSCOPIC STONE EXTRACTION    LAST ONE 04-28-2009   SEVERAL SURGICAL STONE EXTRACTIONS  . LUMBAR LAMINECTOMY  09-30-2002   L4 - 5;  SPONDYLOLISTHISES W/ STENOSIS AND RADICULOPATHY  . PERCUTANEOUS NEPHROSTOLITHOTOMY  1975  . RIGHT URETEROSCOPIC STONE EXTRACTION  LAST ONE 02-21-2011   MULTIPLE SURGICAL STONE EXTRACTIONS  . TONSILECTOMY, ADENOIDECTOMY, BILATERAL MYRINGOTOMY AND TUBES  CHILD  . VAGINAL HYSTERECTOMY  1991   W/ BILATERAL SALPINGOOPHECTOMY    Allergies  Allergen Reactions  . Donepezil Hcl   . Ace Inhibitors  Cough  . Hctz [Hydrochlorothiazide]     Bladder problems  . Triptans     Avoid per neuro    Outpatient Encounter Prescriptions as of 03/11/2017  Medication Sig  . aliskiren (TEKTURNA) 150 MG tablet Take 0.5 tablets (75 mg total) by mouth daily.  Marland Kitchen aspirin EC 81 MG tablet Take 81 mg by mouth daily.  Marland Kitchen atorvastatin (LIPITOR) 10 MG tablet Take 10 mg by mouth at bedtime.   . Cranberry 500 MG TABS Take 1 tablet by mouth daily.  Marland Kitchen docusate sodium (COLACE) 100 MG capsule Take 100 mg by mouth daily as needed for mild constipation or moderate constipation.  . DULoxetine (CYMBALTA) 30 MG capsule Take 60 mg by mouth daily.   Marland Kitchen estradiol (ESTRACE) 1 MG tablet Take 1 mg by mouth daily.  . feeding supplement (BOOST / RESOURCE BREEZE) LIQD Take 1 Container by mouth 2 (two) times daily between meals.  Marland Kitchen ipratropium-albuterol (DUONEB) 0.5-2.5 (3) MG/3ML SOLN Take 3 mLs by nebulization every 6 (six) hours. X 5 days  . [START ON 03/12/2017] levofloxacin (LEVAQUIN) 750 MG tablet Take 750 mg by mouth daily.  Marland Kitchen loperamide (IMODIUM) 2 MG capsule Take 2 mg by mouth 4 (four) times daily as needed for diarrhea or loose stools.  Marland Kitchen LORazepam (  ATIVAN) 1 MG tablet Take 0.5 mg by mouth 3 (three) times daily with meals. May also take 0.5 mg by mouth every 4 hours as needed for agitation or restlessness  . Melatonin 5 MG CAPS Take 5 mg by mouth at bedtime.  Marland Kitchen OLANZapine (ZYPREXA) 10 MG tablet Take 10 mg by mouth at bedtime.  . saccharomyces boulardii (FLORASTOR) 250 MG capsule Take 250 mg by mouth 2 (two) times daily.  . temazepam (RESTORIL) 15 MG capsule Take 15 mg by mouth at bedtime.  . traMADol (ULTRAM) 50 MG tablet Take 50 mg by mouth every 6 (six) hours as needed for moderate pain or severe pain.   . traZODone (DESYREL) 100 MG tablet Take 100 mg by mouth at bedtime.  . [DISCONTINUED] Multiple Vitamins-Minerals (THERA-M PO) Take 1 tablet by mouth daily.   No facility-administered encounter medications on file as of  03/11/2017.     Review of Systems  Constitutional: Positive for activity change, appetite change and fatigue. Negative for chills and fever.  Respiratory: Positive for cough and shortness of breath. Negative for wheezing.   Cardiovascular: Negative for chest pain, palpitations and leg swelling.  Gastrointestinal: Negative for abdominal pain.  Genitourinary: Negative for dysuria.  Musculoskeletal:       No additional falls   Neurological: Negative for dizziness and weakness.  Psychiatric/Behavioral: Positive for agitation, behavioral problems and confusion. Negative for sleep disturbance.       Slept well last night but up for breakfast    Immunization History  Administered Date(s) Administered  . Hepatitis A 11/15/2003, 06/11/2004, 12/25/2012  . Hepatitis B 11/14/2008, 12/15/2008  . Influenza Split 06/17/2011  . Influenza Whole 06/16/2010  . Influenza, High Dose Seasonal PF 05/27/2016  . Pneumococcal Conjugate-13 08/09/2014  . Pneumococcal Polysaccharide-23 04/22/2011, 11/15/2011  . Tdap 11/14/2008, 12/25/2012  . Typhoid Inactivated 12/25/2012  . Yellow Fever 12/25/2012  . Zoster 05/07/2012  . Zoster Recombinat (Shingrix) 01/03/2017   Pertinent  Health Maintenance Due  Topic Date Due  . COLONOSCOPY  01/31/1993  . DEXA SCAN  02/01/2008  . INFLUENZA VACCINE  04/16/2017  . MAMMOGRAM  07/23/2018  . PNA vac Low Risk Adult  Completed   Fall Risk  10/29/2016  Falls in the past year? Yes  Number falls in past yr: 2 or more  Injury with Fall? Yes  Follow up Falls prevention discussed   Functional Status Survey:  requires help with adls, remains ambulatory w/o assistive device  Vitals:   03/11/17 0949  BP: 104/60  Pulse: (!) 104  Resp: 18  Temp: 98.8 F (37.1 C)  TempSrc: Oral  Weight: 122 lb (55.3 kg)   Body mass index is 21.61 kg/m. Physical Exam  Constitutional:  Thin white female  Cardiovascular: Regular rhythm, normal heart sounds and intact distal pulses.     tachycardic  Pulmonary/Chest: Breath sounds normal.  Slight tachypnea noted when walking about  Neurological: She is alert.  Conversing away, aphasic, illogical speech/word salad-like  Skin: Skin is warm and dry.  Psychiatric:  Wandering through unit    Labs reviewed:  Recent Labs  07/15/16 0444 08/22/16 1047 09/26/16 2344 01/20/17 0700 03/10/17 1122  NA 139 142 140 142 139  K 3.9 3.9 4.2 4.4 3.2*  CL 107 105 108  --   --   CO2 25 27 26   --   --   GLUCOSE 110* 89 108*  --   --   BUN 11 15 17 13 18   CREATININE 0.95 1.08 1.12* 0.8  0.9  CALCIUM 9.2 9.9 9.0  --   --     Recent Labs  07/14/16 1909 07/15/16 0444 01/20/17 0700  AST 26 22 19   ALT 18 16 12   ALKPHOS 53 45 54  BILITOT 0.7 0.9  --   PROT 6.4* 5.6*  --   ALBUMIN 3.8 3.3*  --     Recent Labs  07/14/16 1909  07/15/16 0444 08/22/16 1047 09/26/16 2344 01/20/17 0700 03/10/17 1122  WBC 7.7  --  6.1 7.6 6.8 7.5 14.1  NEUTROABS 4.3  --   --  5.1  --   --   --   HGB 14.5  < > 13.4 15.2* 13.8 14.2 12.8  HCT 42.4  < > 39.7 43.9 40.3 44 39  MCV 95.7  --  95.4 95.3 93.9  --   --   PLT 241  --  196 247.0 143* 184 156  < > = values in this interval not displayed. Lab Results  Component Value Date   TSH 0.832 09/28/2016   Lab Results  Component Value Date   HGBA1C 5.2 07/15/2016   Lab Results  Component Value Date   CHOL 170 07/15/2016   HDL 59 07/15/2016   LDLCALC 90 07/15/2016   TRIG 106 07/15/2016   CHOLHDL 2.9 07/15/2016   NP notes, labs and tests reviewed from yesterday  Assessment/Plan 1. Lobar pneumonia ([place]) -improving with rehydration and IV abx she received yesterday, complete planned course of levaquin po and push po fluids, cont to monitor vs q shift as resident allows (resistant at times with her dementia) -eating and drinking today, wandering about, but still tachycardic and sob, afebrile, but has had tylenol also  2. Weakness -seems dramatically better today, still sob  3.  Tachycardia -milder today, persists, push po fluids  4. Cerebral amyloid angiopathy (CODE) -cause of dementia, aphasia -has considerable agitation at times, cont ativan when cannot be redirected  -slept well last night   5. Late onset Alzheimer's disease with behavioral disturbance -rapidly progressive it seems based on records, cont memory care unit support  Family/ staff Communication: discussed with memory care nursing  Labs/tests ordered:  No new--will only recheck labs if not clinically improving (which she is thus far)  Khloey Chern L. Naydeen Speirs, D.O. [facility] [address], [place] [address] Cell Phone (Mon-Fri 8am-5pm):  [phone] On Call:  [phone] & follow prompts after 5pm & weekends Office Phone:  [phone] Office Fax:  [phone]

## 2017-03-13 DIAGNOSIS — G309 Alzheimer's disease, unspecified: Secondary | ICD-10-CM | POA: Diagnosis not present

## 2017-03-13 DIAGNOSIS — R1312 Dysphagia, oropharyngeal phase: Secondary | ICD-10-CM | POA: Diagnosis not present

## 2017-03-13 DIAGNOSIS — H5340 Unspecified visual field defects: Secondary | ICD-10-CM | POA: Diagnosis not present

## 2017-03-13 DIAGNOSIS — H409 Unspecified glaucoma: Secondary | ICD-10-CM | POA: Diagnosis not present

## 2017-03-13 DIAGNOSIS — I680 Cerebral amyloid angiopathy: Secondary | ICD-10-CM | POA: Diagnosis not present

## 2017-03-13 DIAGNOSIS — R278 Other lack of coordination: Secondary | ICD-10-CM | POA: Diagnosis not present

## 2017-03-13 LAB — CBC AND DIFFERENTIAL
HCT: 39 (ref 36–46)
HEMOGLOBIN: 12.8 (ref 12.0–16.0)
Neutrophils Absolute: 5
Platelets: 240 (ref 150–399)
WBC: 6.6

## 2017-03-13 LAB — BASIC METABOLIC PANEL
BUN: 19 (ref 4–21)
CREATININE: 1 (ref 0.5–1.1)
Glucose: 149
POTASSIUM: 4.2 (ref 3.4–5.3)
Sodium: 142 (ref 137–147)

## 2017-03-18 DIAGNOSIS — H409 Unspecified glaucoma: Secondary | ICD-10-CM | POA: Diagnosis not present

## 2017-03-18 DIAGNOSIS — H5340 Unspecified visual field defects: Secondary | ICD-10-CM | POA: Diagnosis not present

## 2017-03-18 DIAGNOSIS — R278 Other lack of coordination: Secondary | ICD-10-CM | POA: Diagnosis not present

## 2017-03-18 DIAGNOSIS — R1312 Dysphagia, oropharyngeal phase: Secondary | ICD-10-CM | POA: Diagnosis not present

## 2017-03-18 DIAGNOSIS — J690 Pneumonitis due to inhalation of food and vomit: Secondary | ICD-10-CM | POA: Diagnosis not present

## 2017-03-18 DIAGNOSIS — I680 Cerebral amyloid angiopathy: Secondary | ICD-10-CM | POA: Diagnosis not present

## 2017-03-18 DIAGNOSIS — G309 Alzheimer's disease, unspecified: Secondary | ICD-10-CM | POA: Diagnosis not present

## 2017-03-19 DIAGNOSIS — N390 Urinary tract infection, site not specified: Secondary | ICD-10-CM | POA: Diagnosis not present

## 2017-03-19 DIAGNOSIS — R319 Hematuria, unspecified: Secondary | ICD-10-CM | POA: Diagnosis not present

## 2017-03-19 DIAGNOSIS — R509 Fever, unspecified: Secondary | ICD-10-CM | POA: Diagnosis not present

## 2017-03-20 ENCOUNTER — Non-Acute Institutional Stay: Payer: Medicare Other | Admitting: Adult Health

## 2017-03-20 ENCOUNTER — Encounter: Payer: Self-pay | Admitting: Adult Health

## 2017-03-20 DIAGNOSIS — G308 Other Alzheimer's disease: Secondary | ICD-10-CM | POA: Diagnosis not present

## 2017-03-20 DIAGNOSIS — G309 Alzheimer's disease, unspecified: Secondary | ICD-10-CM | POA: Diagnosis not present

## 2017-03-20 DIAGNOSIS — R05 Cough: Secondary | ICD-10-CM | POA: Diagnosis not present

## 2017-03-20 DIAGNOSIS — J181 Lobar pneumonia, unspecified organism: Secondary | ICD-10-CM | POA: Diagnosis not present

## 2017-03-20 DIAGNOSIS — R1312 Dysphagia, oropharyngeal phase: Secondary | ICD-10-CM | POA: Diagnosis not present

## 2017-03-20 DIAGNOSIS — I680 Cerebral amyloid angiopathy: Secondary | ICD-10-CM | POA: Diagnosis not present

## 2017-03-20 DIAGNOSIS — H5340 Unspecified visual field defects: Secondary | ICD-10-CM | POA: Diagnosis not present

## 2017-03-20 DIAGNOSIS — R509 Fever, unspecified: Secondary | ICD-10-CM

## 2017-03-20 DIAGNOSIS — R278 Other lack of coordination: Secondary | ICD-10-CM | POA: Diagnosis not present

## 2017-03-20 DIAGNOSIS — F0281 Dementia in other diseases classified elsewhere with behavioral disturbance: Secondary | ICD-10-CM | POA: Diagnosis not present

## 2017-03-20 DIAGNOSIS — H409 Unspecified glaucoma: Secondary | ICD-10-CM | POA: Diagnosis not present

## 2017-03-20 NOTE — Progress Notes (Signed)
Location:  Occupational psychologist of Service:  ALF (13) Provider:   Cindi Carbon, [place] [phone]   Gayland Curry, DO  Patient Care Team: Gayland Curry, DO as PCP - General (Geriatric Medicine) Irine Seal, MD as Attending Physician (Urology) Jodi Marble, MD as Consulting Physician (Otolaryngology) Martinique, Amy, MD as Consulting Physician (Dermatology) Gerda Diss, DO as Consulting Physician (Family Medicine) Deneise Lever, MD as Consulting Physician (Pulmonary Disease) Arta Silence, MD as Consulting Physician (Gastroenterology) Dohmeier, Asencion Partridge, MD as Consulting Physician (Neurology) Geanie Kenning, MD as Consulting Physician (Psychiatry)  Extended Emergency Contact Information Primary Emergency Contact: [name] Address: [address]          [place], [place] [address] [name] of [place] Phone: [phone] Mobile Phone: [phone] Relation: Spouse  Code Status:  DNR Goals of care: Advanced Directive information Advanced Directives 03/11/2017  Does Patient Have a Medical Advance Directive? Yes  Type of Advance Directive Out of facility DNR (pink MOST or yellow form);Living will;Healthcare Power of Attorney  Does patient want to make changes to medical advance directive? -  Copy of [place] in Chart? Yes  Would patient like information on creating a medical advance directive? -  Pre-existing out of facility DNR order (yellow form or pink MOST form) Yellow form placed in chart (order not valid for inpatient use)     Chief Complaint  Patient presents with  . Acute Visit    agitation and low grade temp    HPI:  Pt is a 76 y.o. female seen today for an acute visit for temp of 100.5 on 7/4 with agitation. Staff report that on 7/4 she was hitting the staff and showed signs of agitation/aggression which was a deviation from her norm. She has a hx of progressive memory loss and  amyloid angiopathy and is not able to provide a hx. She typically is ambulatory and pleasant but tearful frequently and anxious. 03/10/17 CXR was obtained for a 2 view today which showed right middle and right lower lobe pneumonia which was treated with 5 days of Levaquin 750 mg.  She is followed by ST, they have not noted any oropharyngeal issues but she does eat very quickly and stuffs large amts of food in her mouth. Now she has cuing and 1:1 supervision to help with this issue.  She has not had any cough, congestion, urinary symptoms.  She denied any neck or back pain but has reported neck pain to the speech therapist in the past. A UA was obtained on 7/4 which was normal except a moderate amt of blood.  Temp 03/20/2017 99.5  Past Medical History:  Diagnosis Date  . Arthritis    back and neck  . Carotid artery stenosis   . Cerebral amyloid angiopathy (CODE) 06/20/2016  . Chronic rhinosinusitis   . DDD (degenerative disc disease)   . Depression   . Diverticulosis of colon   . Frequency of urination   . Glaucoma   . Headache disorder 12/13/2014   Right temple  . Hearing loss   . History of kidney stones    MULTIBLE SURGICAL INTERVENTIONS  . History of skin cancer HX TOPICAL FACIAL [place] 2013  . History of TIA (transient ischemic attack)    NOV 2013-- NO RESIDUAL  . Hypertension   . IBS (irritable bowel syndrome)   . Memory difficulty 05/14/2016  . Memory loss   . Mild asthma  COLD INDUCED  . Right ureteral stone   . Seasonal and perennial allergic rhinitis   . Shingles    EYEBALL 09-17-2013  . Spondylosis    Past Surgical History:  Procedure Laterality Date  . BUNIONECTOMY     BILATERAL  . CALDWELL LUC     sinus drainage procedure  . CYSTO/ BILATERAL URETEROSCOPIC STONE EXTRACTIONS  02-09-2003  . CYSTO/ BILATERAL URETEROSCOPY/  LASER OF STONES LEFT SIDE  06-02-2007  . CYSTOSCOPY/RETROGRADE/URETEROSCOPY/STONE EXTRACTION WITH BASKET  03/31/2012   Procedure:  CYSTOSCOPY/RETROGRADE/URETEROSCOPY/STONE EXTRACTION WITH BASKET;  Surgeon: Malka So, MD;  Location: [facility];  Service: Urology;  Laterality: Right;   . CYSTOSCOPY/RETROGRADE/URETEROSCOPY/STONE EXTRACTION WITH BASKET Right 03/29/2013   Procedure: CYSTOSCOPY//URETEROSCOPY/STONE EXTRACTION WITH BASKET;  Surgeon: Malka So, MD;  Location: [facility];  Service: Urology;  Laterality: Right;  . CYSTOSCOPY/RETROGRADE/URETEROSCOPY/STONE EXTRACTION WITH BASKET Right 10/14/2013   Procedure: CYSTOSCOPY/RETROGRADE/URETEROSCOPY/STONE EXTRACTION WITH BASKET;  Surgeon: Irine Seal, MD;  Location[facility];  Service: Urology;  Laterality: Right;  . ESOPHAGOGASTRODUODENOSCOPY (EGD) WITH PROPOFOL N/A 04/12/2015   Procedure: ESOPHAGOGASTRODUODENOSCOPY (EGD) WITH PROPOFOL;  Surgeon: Arta Silence, MD;  Location: [facility];  Service: Endoscopy;  Laterality: N/A;  . EUS N/A 04/12/2015   Procedure: ESOPHAGEAL ENDOSCOPIC ULTRASOUND (EUS) RADIAL;  Surgeon: Arta Silence, MD;  Location: [facility];  Service: Endoscopy;  Laterality: N/A;  . EXTRACORPOREAL SHOCK WAVE LITHOTRIPSY  about [phone]/ Dr Reece Agar   Never completed procedure-could not tolerate pain. Had seizures due to pain  . EYE SURGERY  AGE 26   INJURY  . HERNIA REPAIR  AGE 60  . HOLMIUM LASER APPLICATION Right 01/07/5360   Procedure: HOLMIUM LASER APPLICATION;  Surgeon: Irine Seal, MD;  Location: [facility];  Service: Urology;  Laterality: Right;  . LAPAROSCOPIC CHOLECYSTECTOMY  01-12-2005  . LEFT URETEROSCOPIC STONE EXTRACTION    LAST ONE 04-28-2009   SEVERAL SURGICAL STONE EXTRACTIONS  . LUMBAR LAMINECTOMY  09-30-2002   L4 - 5;  SPONDYLOLISTHISES W/ STENOSIS AND RADICULOPATHY  . PERCUTANEOUS NEPHROSTOLITHOTOMY  1975  . RIGHT URETEROSCOPIC STONE EXTRACTION  LAST ONE 02-21-2011   MULTIPLE SURGICAL STONE EXTRACTIONS  . TONSILECTOMY, ADENOIDECTOMY, BILATERAL MYRINGOTOMY AND TUBES  CHILD  . VAGINAL  HYSTERECTOMY  1991   W/ BILATERAL SALPINGOOPHECTOMY    Allergies  Allergen Reactions  . Donepezil Hcl   . Ace Inhibitors Cough  . Hctz [Hydrochlorothiazide]     Bladder problems  . Triptans     Avoid per neuro    Outpatient Encounter Prescriptions as of 03/20/2017  Medication Sig  . aliskiren (TEKTURNA) 150 MG tablet Take 0.5 tablets (75 mg total) by mouth daily.  Marland Kitchen aspirin EC 81 MG tablet Take 81 mg by mouth daily.  Marland Kitchen atorvastatin (LIPITOR) 10 MG tablet Take 10 mg by mouth daily at 6 PM.  . Cranberry 500 MG TABS Take 1 tablet by mouth daily.  Marland Kitchen docusate sodium (COLACE) 100 MG capsule Take 100 mg by mouth daily as needed for mild constipation or moderate constipation.  . DULoxetine (CYMBALTA) 30 MG capsule Take 60 mg by mouth daily.   Marland Kitchen estradiol (ESTRACE) 1 MG tablet Take 1 mg by mouth every other day.   . feeding supplement (BOOST / RESOURCE BREEZE) LIQD Take 1 Container by mouth 2 (two) times daily between meals.  Marland Kitchen loperamide (IMODIUM) 2 MG capsule Take 2 mg by mouth 4 (four) times daily as needed for diarrhea or loose stools.  Marland Kitchen LORazepam (ATIVAN) 1 MG tablet Take 0.5  mg by mouth 3 (three) times daily with meals. May also take 0.5 mg by mouth every 4 hours as needed for agitation or restlessness  . Melatonin 5 MG CAPS Take 5 mg by mouth at bedtime.  Marland Kitchen OLANZapine (ZYPREXA) 10 MG tablet Take 10 mg by mouth at bedtime.  . temazepam (RESTORIL) 15 MG capsule Take 15 mg by mouth at bedtime.  . traMADol (ULTRAM) 50 MG tablet Take 50 mg by mouth every 6 (six) hours as needed for moderate pain or severe pain.   . traZODone (DESYREL) 100 MG tablet Take 100 mg by mouth at bedtime.  . [DISCONTINUED] ipratropium-albuterol (DUONEB) 0.5-2.5 (3) MG/3ML SOLN Take 3 mLs by nebulization every 6 (six) hours. X 5 days  . [DISCONTINUED] saccharomyces boulardii (FLORASTOR) 250 MG capsule Take 250 mg by mouth 2 (two) times daily.   No facility-administered encounter medications on file as of 03/20/2017.       Review of Systems  Unable to perform ROS: Dementia    Immunization History  Administered Date(s) Administered  . Hepatitis A 11/15/2003, 06/11/2004, 12/25/2012  . Hepatitis B 11/14/2008, 12/15/2008  . Influenza Split 06/17/2011  . Influenza Whole 06/16/2010  . Influenza, High Dose Seasonal PF 05/27/2016  . Pneumococcal Conjugate-13 08/09/2014  . Pneumococcal Polysaccharide-23 04/22/2011, 11/15/2011  . Tdap 11/14/2008, 12/25/2012  . Typhoid Inactivated 12/25/2012  . Yellow Fever 12/25/2012  . Zoster 05/07/2012  . Zoster Recombinat (Shingrix) 01/03/2017   Pertinent  Health Maintenance Due  Topic Date Due  . COLONOSCOPY  01/31/1993  . DEXA SCAN  02/01/2008  . INFLUENZA VACCINE  04/16/2017  . MAMMOGRAM  07/23/2018  . PNA vac Low Risk Adult  Completed   Fall Risk  10/29/2016  Falls in the past year? Yes  Number falls in past yr: 2 or more  Injury with Fall? Yes  Follow up Falls prevention discussed   Functional Status Survey:    Vitals:   03/20/17 1501  BP: 124/67  Pulse: 98  Resp: 16  Temp: 99.5 F (37.5 C)  SpO2: 95%   There is no height or weight on file to calculate BMI. Physical Exam  Constitutional: No distress.  HENT:  Head: Normocephalic and atraumatic.  Right Ear: External ear normal.  Left Ear: External ear normal.  Nose: Nose normal.  Mouth/Throat: Oropharynx is clear and moist. No oropharyngeal exudate.  Eyes: Conjunctivae and EOM are normal. Pupils are equal, round, and reactive to light. Right eye exhibits no discharge. Left eye exhibits no discharge.  Neck: Normal range of motion. Neck supple. No JVD present. No tracheal deviation present. No thyromegaly present.  Cardiovascular: Normal rate and regular rhythm.   No murmur heard. Pulmonary/Chest: Effort normal and breath sounds normal. No respiratory distress. She has no wheezes.  Abdominal: Soft. Bowel sounds are normal. She exhibits no distension. There is no tenderness. There is no rebound  and no guarding.  No CVA or S/P tenderness  Musculoskeletal: She exhibits no edema or tenderness.  Lymphadenopathy:    She has no cervical adenopathy.  Neurological: She is alert. No cranial nerve deficit.  Oriented to self only. Able to follow commands, pleasant.    Skin: Skin is warm. She is diaphoretic.  Psychiatric: She has a normal mood and affect.  Nursing note and vitals reviewed.   Labs reviewed:  Recent Labs  07/15/16 0444 08/22/16 1047 09/26/16 2344 01/20/17 0700 03/10/17 1122 03/13/17  NA 139 142 140 142 139 142  K 3.9 3.9 4.2 4.4 3.2* 4.2  CL 107 105 108  --   --   --   CO2 25 27 26   --   --   --   GLUCOSE 110* 89 108*  --   --   --   BUN 11 15 17 13 18 19   CREATININE 0.95 1.08 1.12* 0.8 0.9 1.0  CALCIUM 9.2 9.9 9.0  --   --   --     Recent Labs  07/14/16 1909 07/15/16 0444 01/20/17 0700  AST 26 22 19   ALT 18 16 12   ALKPHOS 53 45 54  BILITOT 0.7 0.9  --   PROT 6.4* 5.6*  --   ALBUMIN 3.8 3.3*  --     Recent Labs  07/14/16 1909  07/15/16 0444 08/22/16 1047 09/26/16 2344 01/20/17 0700 03/10/17 1122 03/13/17  WBC 7.7  --  6.1 7.6 6.8 7.5 14.1 6.6  NEUTROABS 4.3  --   --  5.1  --   --   --  5  HGB 14.5  < > 13.4 15.2* 13.8 14.2 12.8 12.8  HCT 42.4  < > 39.7 43.9 40.3 44 39 39  MCV 95.7  --  95.4 95.3 93.9  --   --   --   PLT 241  --  196 247.0 143* 184 156 240  < > = values in this interval not displayed. Lab Results  Component Value Date   TSH 0.832 09/28/2016   Lab Results  Component Value Date   HGBA1C 5.2 07/15/2016   Lab Results  Component Value Date   CHOL 170 07/15/2016   HDL 59 07/15/2016   LDLCALC 90 07/15/2016   TRIG 106 07/15/2016   CHOLHDL 2.9 07/15/2016    Significant Diagnostic Results in last 30 days:  No results found.  Assessment/Plan  1. Low grade fever ? Recurrent pna, no other symptoms at this point I have asked the staff to monitor her VS qshift with pulse for 5 days to monitor trends  2. Alzheimer's  disease of other onset with behavioral disturbance Has amyloid angiopathy and this which has led to behavioral issues that are management by psych  3. Lobar pneumonia ([place]) Check follow up CXR for clearance of pneumonia   Family/ staff Communication: staff   Labs/tests ordered:  CXR

## 2017-03-21 DIAGNOSIS — H5340 Unspecified visual field defects: Secondary | ICD-10-CM | POA: Diagnosis not present

## 2017-03-21 DIAGNOSIS — R1312 Dysphagia, oropharyngeal phase: Secondary | ICD-10-CM | POA: Diagnosis not present

## 2017-03-21 DIAGNOSIS — H409 Unspecified glaucoma: Secondary | ICD-10-CM | POA: Diagnosis not present

## 2017-03-21 DIAGNOSIS — I680 Cerebral amyloid angiopathy: Secondary | ICD-10-CM | POA: Diagnosis not present

## 2017-03-21 DIAGNOSIS — R278 Other lack of coordination: Secondary | ICD-10-CM | POA: Diagnosis not present

## 2017-03-21 DIAGNOSIS — G309 Alzheimer's disease, unspecified: Secondary | ICD-10-CM | POA: Diagnosis not present

## 2017-03-25 DIAGNOSIS — H5340 Unspecified visual field defects: Secondary | ICD-10-CM | POA: Diagnosis not present

## 2017-03-25 DIAGNOSIS — R1312 Dysphagia, oropharyngeal phase: Secondary | ICD-10-CM | POA: Diagnosis not present

## 2017-03-25 DIAGNOSIS — G309 Alzheimer's disease, unspecified: Secondary | ICD-10-CM | POA: Diagnosis not present

## 2017-03-25 DIAGNOSIS — H409 Unspecified glaucoma: Secondary | ICD-10-CM | POA: Diagnosis not present

## 2017-03-25 DIAGNOSIS — I680 Cerebral amyloid angiopathy: Secondary | ICD-10-CM | POA: Diagnosis not present

## 2017-03-25 DIAGNOSIS — R278 Other lack of coordination: Secondary | ICD-10-CM | POA: Diagnosis not present

## 2017-03-26 DIAGNOSIS — R1312 Dysphagia, oropharyngeal phase: Secondary | ICD-10-CM | POA: Diagnosis not present

## 2017-03-26 DIAGNOSIS — G309 Alzheimer's disease, unspecified: Secondary | ICD-10-CM | POA: Diagnosis not present

## 2017-03-26 DIAGNOSIS — R278 Other lack of coordination: Secondary | ICD-10-CM | POA: Diagnosis not present

## 2017-03-26 DIAGNOSIS — H5340 Unspecified visual field defects: Secondary | ICD-10-CM | POA: Diagnosis not present

## 2017-03-26 DIAGNOSIS — I680 Cerebral amyloid angiopathy: Secondary | ICD-10-CM | POA: Diagnosis not present

## 2017-03-26 DIAGNOSIS — H409 Unspecified glaucoma: Secondary | ICD-10-CM | POA: Diagnosis not present

## 2017-03-27 DIAGNOSIS — H409 Unspecified glaucoma: Secondary | ICD-10-CM | POA: Diagnosis not present

## 2017-03-27 DIAGNOSIS — G309 Alzheimer's disease, unspecified: Secondary | ICD-10-CM | POA: Diagnosis not present

## 2017-03-27 DIAGNOSIS — H5340 Unspecified visual field defects: Secondary | ICD-10-CM | POA: Diagnosis not present

## 2017-03-27 DIAGNOSIS — I680 Cerebral amyloid angiopathy: Secondary | ICD-10-CM | POA: Diagnosis not present

## 2017-03-27 DIAGNOSIS — R1312 Dysphagia, oropharyngeal phase: Secondary | ICD-10-CM | POA: Diagnosis not present

## 2017-03-27 DIAGNOSIS — R278 Other lack of coordination: Secondary | ICD-10-CM | POA: Diagnosis not present

## 2017-03-28 DIAGNOSIS — I680 Cerebral amyloid angiopathy: Secondary | ICD-10-CM | POA: Diagnosis not present

## 2017-03-28 DIAGNOSIS — H409 Unspecified glaucoma: Secondary | ICD-10-CM | POA: Diagnosis not present

## 2017-03-28 DIAGNOSIS — R1312 Dysphagia, oropharyngeal phase: Secondary | ICD-10-CM | POA: Diagnosis not present

## 2017-03-28 DIAGNOSIS — R278 Other lack of coordination: Secondary | ICD-10-CM | POA: Diagnosis not present

## 2017-03-28 DIAGNOSIS — H5340 Unspecified visual field defects: Secondary | ICD-10-CM | POA: Diagnosis not present

## 2017-03-28 DIAGNOSIS — G309 Alzheimer's disease, unspecified: Secondary | ICD-10-CM | POA: Diagnosis not present

## 2017-04-01 DIAGNOSIS — H5340 Unspecified visual field defects: Secondary | ICD-10-CM | POA: Diagnosis not present

## 2017-04-01 DIAGNOSIS — H409 Unspecified glaucoma: Secondary | ICD-10-CM | POA: Diagnosis not present

## 2017-04-01 DIAGNOSIS — R1312 Dysphagia, oropharyngeal phase: Secondary | ICD-10-CM | POA: Diagnosis not present

## 2017-04-01 DIAGNOSIS — I680 Cerebral amyloid angiopathy: Secondary | ICD-10-CM | POA: Diagnosis not present

## 2017-04-01 DIAGNOSIS — R278 Other lack of coordination: Secondary | ICD-10-CM | POA: Diagnosis not present

## 2017-04-01 DIAGNOSIS — G309 Alzheimer's disease, unspecified: Secondary | ICD-10-CM | POA: Diagnosis not present

## 2017-04-03 DIAGNOSIS — R278 Other lack of coordination: Secondary | ICD-10-CM | POA: Diagnosis not present

## 2017-04-03 DIAGNOSIS — H409 Unspecified glaucoma: Secondary | ICD-10-CM | POA: Diagnosis not present

## 2017-04-03 DIAGNOSIS — G309 Alzheimer's disease, unspecified: Secondary | ICD-10-CM | POA: Diagnosis not present

## 2017-04-03 DIAGNOSIS — H5340 Unspecified visual field defects: Secondary | ICD-10-CM | POA: Diagnosis not present

## 2017-04-03 DIAGNOSIS — I680 Cerebral amyloid angiopathy: Secondary | ICD-10-CM | POA: Diagnosis not present

## 2017-04-03 DIAGNOSIS — R1312 Dysphagia, oropharyngeal phase: Secondary | ICD-10-CM | POA: Diagnosis not present

## 2017-04-07 DIAGNOSIS — I680 Cerebral amyloid angiopathy: Secondary | ICD-10-CM | POA: Diagnosis not present

## 2017-04-07 DIAGNOSIS — R278 Other lack of coordination: Secondary | ICD-10-CM | POA: Diagnosis not present

## 2017-04-07 DIAGNOSIS — R1312 Dysphagia, oropharyngeal phase: Secondary | ICD-10-CM | POA: Diagnosis not present

## 2017-04-07 DIAGNOSIS — G309 Alzheimer's disease, unspecified: Secondary | ICD-10-CM | POA: Diagnosis not present

## 2017-04-07 DIAGNOSIS — H5340 Unspecified visual field defects: Secondary | ICD-10-CM | POA: Diagnosis not present

## 2017-04-07 DIAGNOSIS — H409 Unspecified glaucoma: Secondary | ICD-10-CM | POA: Diagnosis not present

## 2017-04-08 DIAGNOSIS — L84 Corns and callosities: Secondary | ICD-10-CM | POA: Diagnosis not present

## 2017-04-08 DIAGNOSIS — B351 Tinea unguium: Secondary | ICD-10-CM | POA: Diagnosis not present

## 2017-04-09 DIAGNOSIS — H524 Presbyopia: Secondary | ICD-10-CM | POA: Diagnosis not present

## 2017-04-09 DIAGNOSIS — G309 Alzheimer's disease, unspecified: Secondary | ICD-10-CM | POA: Diagnosis not present

## 2017-04-09 DIAGNOSIS — H5340 Unspecified visual field defects: Secondary | ICD-10-CM | POA: Diagnosis not present

## 2017-04-09 DIAGNOSIS — I680 Cerebral amyloid angiopathy: Secondary | ICD-10-CM | POA: Diagnosis not present

## 2017-04-09 DIAGNOSIS — R278 Other lack of coordination: Secondary | ICD-10-CM | POA: Diagnosis not present

## 2017-04-09 DIAGNOSIS — R1312 Dysphagia, oropharyngeal phase: Secondary | ICD-10-CM | POA: Diagnosis not present

## 2017-04-09 DIAGNOSIS — H409 Unspecified glaucoma: Secondary | ICD-10-CM | POA: Diagnosis not present

## 2017-04-09 DIAGNOSIS — H26493 Other secondary cataract, bilateral: Secondary | ICD-10-CM | POA: Diagnosis not present

## 2017-04-15 DIAGNOSIS — R1312 Dysphagia, oropharyngeal phase: Secondary | ICD-10-CM | POA: Diagnosis not present

## 2017-04-15 DIAGNOSIS — R278 Other lack of coordination: Secondary | ICD-10-CM | POA: Diagnosis not present

## 2017-04-15 DIAGNOSIS — I680 Cerebral amyloid angiopathy: Secondary | ICD-10-CM | POA: Diagnosis not present

## 2017-04-15 DIAGNOSIS — G309 Alzheimer's disease, unspecified: Secondary | ICD-10-CM | POA: Diagnosis not present

## 2017-04-15 DIAGNOSIS — H5340 Unspecified visual field defects: Secondary | ICD-10-CM | POA: Diagnosis not present

## 2017-04-15 DIAGNOSIS — H409 Unspecified glaucoma: Secondary | ICD-10-CM | POA: Diagnosis not present

## 2017-04-17 ENCOUNTER — Non-Acute Institutional Stay: Payer: Medicare Other | Admitting: Adult Health

## 2017-04-17 ENCOUNTER — Encounter: Payer: Self-pay | Admitting: Adult Health

## 2017-04-17 DIAGNOSIS — F0281 Dementia in other diseases classified elsewhere with behavioral disturbance: Secondary | ICD-10-CM

## 2017-04-17 DIAGNOSIS — Z5181 Encounter for therapeutic drug level monitoring: Secondary | ICD-10-CM

## 2017-04-17 DIAGNOSIS — G300 Alzheimer's disease with early onset: Secondary | ICD-10-CM

## 2017-04-17 DIAGNOSIS — F5101 Primary insomnia: Secondary | ICD-10-CM

## 2017-04-17 DIAGNOSIS — F02818 Dementia in other diseases classified elsewhere, unspecified severity, with other behavioral disturbance: Secondary | ICD-10-CM

## 2017-04-17 NOTE — Progress Notes (Signed)
Location:  Occupational psychologist of Service:  ALF (13) Provider:   Cindi Carbon, ANP [place] [phone]   Gayland Curry, DO  Patient Care Team: Gayland Curry, DO as PCP - General (Geriatric Medicine) Irine Seal, MD as Attending Physician (Urology) Jodi Marble, MD as Consulting Physician (Otolaryngology) Martinique, Amy, MD as Consulting Physician (Dermatology) Gerda Diss, DO as Consulting Physician (Family Medicine) Deneise Lever, MD as Consulting Physician (Pulmonary Disease) Arta Silence, MD as Consulting Physician (Gastroenterology) Dohmeier, Asencion Partridge, MD as Consulting Physician (Neurology) Geanie Kenning, MD as Consulting Physician (Psychiatry)  Extended Emergency Contact Information Primary Emergency Contact: [name] Address: [address]          [place], [place] [address] [name] of [place] Phone: [phone] Mobile Phone: [phone] Relation: Spouse  Code Status:  DNR Goals of care: Advanced Directive information Advanced Directives 04/17/2017  Does Patient Have a Medical Advance Directive? Yes  Type of Advance Directive Out of facility DNR (pink MOST or yellow form);Living will;Healthcare Power of Attorney  Does patient want to make changes to medical advance directive? -  Copy of [place] in Chart? Yes  Would patient like information on creating a medical advance directive? -  Pre-existing out of facility DNR order (yellow form or pink MOST form) Yellow form placed in chart (order not valid for inpatient use)     Chief Complaint  Patient presents with  . Acute Visit    f/u  not sleeping    HPI:  Pt is a 76 y.o. female seen today to follow up regarding insomnia.  She has a hx of early onset AD as well as possible cerebral amyloid angiopathy.  She resides in memory care where she spends much of the day pacing the unit. She has periods of crying and has severe delusions  that are upsetting to her. On 7/18 Trazodone was increased to 150 mg qhs for insomnia. She is also on melatonin and temazepam. Followed by Dr. Geanie Kenning with psych. We are helping managing the insomnia so she does not have to go out to see the psychiatrist so frequently which can be distressing to her. Her sleep log shows 3 nights of sleep followed by 3 nights of difficulty sleeping. The staff also report she has periods of inappropriate laughter. Her behaviors are improved since her admission to memory care but still present (crying and anxiety, pacing).  She has used prn ativan 3 x in the past two weeks. Also some weight gain is noted while on boost twice a day which was recently changed to once a day. Wt Readings from Last 3 Encounters:  04/17/17 121 lb 6.4 oz (55.1 kg)  03/11/17 122 lb (55.3 kg)  01/28/17 117 lb (53.1 kg)    MMSE 13/30 01/28/17  Past Medical History:  Diagnosis Date  . Arthritis    back and neck  . Carotid artery stenosis   . Cerebral amyloid angiopathy (CODE) 06/20/2016  . Chronic rhinosinusitis   . DDD (degenerative disc disease)   . Depression   . Diverticulosis of colon   . Frequency of urination   . Glaucoma   . Headache disorder 12/13/2014   Right temple  . Hearing loss   . History of kidney stones    MULTIBLE SURGICAL INTERVENTIONS  . History of skin cancer HX TOPICAL FACIAL [place] 2013  . History of TIA (transient ischemic attack)    NOV 2013-- NO RESIDUAL  .  Hypertension   . IBS (irritable bowel syndrome)   . Memory difficulty 05/14/2016  . Memory loss   . Mild asthma    COLD INDUCED  . Right ureteral stone   . Seasonal and perennial allergic rhinitis   . Shingles    EYEBALL 09-17-2013  . Spondylosis    Past Surgical History:  Procedure Laterality Date  . BUNIONECTOMY     BILATERAL  . CALDWELL LUC     sinus drainage procedure  . CYSTO/ BILATERAL URETEROSCOPIC STONE EXTRACTIONS  02-09-2003  . CYSTO/ BILATERAL URETEROSCOPY/  LASER OF  STONES LEFT SIDE  06-02-2007  . CYSTOSCOPY/RETROGRADE/URETEROSCOPY/STONE EXTRACTION WITH BASKET  03/31/2012   Procedure: CYSTOSCOPY/RETROGRADE/URETEROSCOPY/STONE EXTRACTION WITH BASKET;  Surgeon: Malka So, MD;  Location: [facility];  Service: Urology;  Laterality: Right;   . CYSTOSCOPY/RETROGRADE/URETEROSCOPY/STONE EXTRACTION WITH BASKET Right 03/29/2013   Procedure: CYSTOSCOPY//URETEROSCOPY/STONE EXTRACTION WITH BASKET;  Surgeon: Malka So, MD;  Location: [facility];  Service: Urology;  Laterality: Right;  . CYSTOSCOPY/RETROGRADE/URETEROSCOPY/STONE EXTRACTION WITH BASKET Right 10/14/2013   Procedure: CYSTOSCOPY/RETROGRADE/URETEROSCOPY/STONE EXTRACTION WITH BASKET;  Surgeon: Irine Seal, MD;  Location: [facility];  Service: Urology;  Laterality: Right;  . ESOPHAGOGASTRODUODENOSCOPY (EGD) WITH PROPOFOL N/A 04/12/2015   Procedure: ESOPHAGOGASTRODUODENOSCOPY (EGD) WITH PROPOFOL;  Surgeon: Arta Silence, MD;  Location: [facility];  Service: Endoscopy;  Laterality: N/A;  . EUS N/A 04/12/2015   Procedure: ESOPHAGEAL ENDOSCOPIC ULTRASOUND (EUS) RADIAL;  Surgeon: Arta Silence, MD;  Location: [facility];  Service: Endoscopy;  Laterality: N/A;  . EXTRACORPOREAL SHOCK WAVE LITHOTRIPSY  about 6283/ Dr Reece Agar   Never completed procedure-could not tolerate pain. Had seizures due to pain  . EYE SURGERY  AGE 46   INJURY  . HERNIA REPAIR  AGE 58  . HOLMIUM LASER APPLICATION Right 1/51/7616   Procedure: HOLMIUM LASER APPLICATION;  Surgeon: Irine Seal, MD;  Location: [facility];  Service: Urology;  Laterality: Right;  . LAPAROSCOPIC CHOLECYSTECTOMY  01-12-2005  . LEFT URETEROSCOPIC STONE EXTRACTION    LAST ONE 04-28-2009   SEVERAL SURGICAL STONE EXTRACTIONS  . LUMBAR LAMINECTOMY  09-30-2002   L4 - 5;  SPONDYLOLISTHISES W/ STENOSIS AND RADICULOPATHY  . PERCUTANEOUS NEPHROSTOLITHOTOMY  1975  . RIGHT URETEROSCOPIC STONE EXTRACTION  LAST ONE 02-21-2011    MULTIPLE SURGICAL STONE EXTRACTIONS  . TONSILECTOMY, ADENOIDECTOMY, BILATERAL MYRINGOTOMY AND TUBES  CHILD  . VAGINAL HYSTERECTOMY  1991   W/ BILATERAL SALPINGOOPHECTOMY    Allergies  Allergen Reactions  . Donepezil Hcl   . Ace Inhibitors Cough  . Hctz [Hydrochlorothiazide]     Bladder problems  . Triptans     Avoid per neuro    Outpatient Encounter Prescriptions as of 04/17/2017  Medication Sig  . aliskiren (TEKTURNA) 150 MG tablet Take 0.5 tablets (75 mg total) by mouth daily.  Marland Kitchen aspirin EC 81 MG tablet Take 81 mg by mouth daily.  Marland Kitchen atorvastatin (LIPITOR) 10 MG tablet Take 10 mg by mouth daily at 6 PM.  . Cranberry 500 MG TABS Take 1 tablet by mouth daily.  Marland Kitchen docusate sodium (COLACE) 100 MG capsule Take 100 mg by mouth daily as needed for mild constipation or moderate constipation.  . DULoxetine (CYMBALTA) 30 MG capsule Take 60 mg by mouth daily.   Marland Kitchen estradiol (ESTRACE) 1 MG tablet Take 1 mg by mouth every other day.   . feeding supplement (BOOST / RESOURCE BREEZE) LIQD Take 1 Container by mouth daily.   Marland Kitchen loperamide (IMODIUM) 2 MG capsule Take 2 mg  by mouth 4 (four) times daily as needed for diarrhea or loose stools.  Marland Kitchen LORazepam (ATIVAN) 1 MG tablet Take 0.5 mg by mouth 3 (three) times daily with meals. May also take 0.5 mg by mouth every 4 hours as needed for agitation or restlessness  . Melatonin 5 MG CAPS Take 5 mg by mouth at bedtime.  Marland Kitchen OLANZapine (ZYPREXA) 10 MG tablet Take 10 mg by mouth at bedtime.  . temazepam (RESTORIL) 15 MG capsule Take 15 mg by mouth at bedtime.  . traMADol (ULTRAM) 50 MG tablet Take 50 mg by mouth every 6 (six) hours as needed for moderate pain or severe pain.   . traZODone (DESYREL) 100 MG tablet Take 150 mg by mouth at bedtime.    No facility-administered encounter medications on file as of 04/17/2017.     Review of Systems  Unable to perform ROS: Dementia    Immunization History  Administered Date(s) Administered  . Hepatitis A 11/15/2003,  06/11/2004, 12/25/2012  . Hepatitis B 11/14/2008, 12/15/2008  . Influenza Split 06/17/2011  . Influenza Whole 06/16/2010  . Influenza, High Dose Seasonal PF 05/27/2016  . Pneumococcal Conjugate-13 08/09/2014  . Pneumococcal Polysaccharide-23 04/22/2011, 11/15/2011  . Tdap 11/14/2008, 12/25/2012  . Typhoid Inactivated 12/25/2012  . Yellow Fever 12/25/2012  . Zoster 05/07/2012  . Zoster Recombinat (Shingrix) 01/03/2017   Pertinent  Health Maintenance Due  Topic Date Due  . COLONOSCOPY  01/31/1993  . DEXA SCAN  02/01/2008  . INFLUENZA VACCINE  04/16/2017  . MAMMOGRAM  07/23/2018  . PNA vac Low Risk Adult  Completed   Fall Risk  10/29/2016  Falls in the past year? Yes  Number falls in past yr: 2 or more  Injury with Fall? Yes  Follow up Falls prevention discussed   Functional Status Survey:    Vitals:   04/17/17 1434  BP: 130/64  Pulse: 100  SpO2: 95%  Weight: 121 lb 6.4 oz (55.1 kg)   Body mass index is 21.51 kg/m. Physical Exam  Constitutional: No distress.  HENT:  Head: Normocephalic and atraumatic.  Right Ear: External ear normal.  Left Ear: External ear normal.  Nose: Nose normal.  Mouth/Throat: Oropharynx is clear and moist. No oropharyngeal exudate.  Eyes: Pupils are equal, round, and reactive to light. Conjunctivae and EOM are normal. Right eye exhibits no discharge. Left eye exhibits no discharge.  Neck: Normal range of motion. Neck supple. No JVD present. No tracheal deviation present. No thyromegaly present.  Cardiovascular: Normal rate and regular rhythm.   No murmur heard. Pulmonary/Chest: Effort normal and breath sounds normal. No respiratory distress. She has no wheezes.  Abdominal: Soft. Bowel sounds are normal. She exhibits no distension. There is no tenderness. There is no rebound and no guarding.  No CVA or S/P tenderness  Musculoskeletal: She exhibits no edema or tenderness.  Lymphadenopathy:    She has no cervical adenopathy.  Neurological: She  is alert. No cranial nerve deficit.  Oriented to self only. Able to follow commands, pleasant.    Skin: Skin is warm and dry. She is not diaphoretic.  Psychiatric: She has a normal mood and affect.  Nursing note and vitals reviewed.   Labs reviewed:  Recent Labs  07/15/16 0444 08/22/16 1047 09/26/16 2344 01/20/17 0700 03/10/17 1122 03/13/17  NA 139 142 140 142 139 142  K 3.9 3.9 4.2 4.4 3.2* 4.2  CL 107 105 108  --   --   --   CO2 25 27 26   --   --   --  GLUCOSE 110* 89 108*  --   --   --   BUN 11 15 17 13 18 19   CREATININE 0.95 1.08 1.12* 0.8 0.9 1.0  CALCIUM 9.2 9.9 9.0  --   --   --     Recent Labs  07/14/16 1909 07/15/16 0444 01/20/17 0700  AST 26 22 19   ALT 18 16 12   ALKPHOS 53 45 54  BILITOT 0.7 0.9  --   PROT 6.4* 5.6*  --   ALBUMIN 3.8 3.3*  --     Recent Labs  07/14/16 1909  07/15/16 0444 08/22/16 1047 09/26/16 2344 01/20/17 0700 03/10/17 1122 03/13/17  WBC 7.7  --  6.1 7.6 6.8 7.5 14.1 6.6  NEUTROABS 4.3  --   --  5.1  --   --   --  5  HGB 14.5  < > 13.4 15.2* 13.8 14.2 12.8 12.8  HCT 42.4  < > 39.7 43.9 40.3 44 39 39  MCV 95.7  --  95.4 95.3 93.9  --   --   --   PLT 241  --  196 247.0 143* 184 156 240  < > = values in this interval not displayed. Lab Results  Component Value Date   TSH 0.832 09/28/2016   Lab Results  Component Value Date   HGBA1C 5.2 07/15/2016   Lab Results  Component Value Date   CHOL 170 07/15/2016   HDL 59 07/15/2016   LDLCALC 90 07/15/2016   TRIG 106 07/15/2016   CHOLHDL 2.9 07/15/2016    Significant Diagnostic Results in last 30 days:  No results found.  Assessment/Plan  1) Insomnia Improved sleep quality (may never be perfect) Continue current medications and recheck next month If no improvement can increase Temazepam or Trazodone  2) Alz with behavioral disturbance Followed by psych for behaviors Some improvement noted as well Check A1C while on Zyprexa   Family/ staff Communication:  staff   Labs/tests ordered:  A1C

## 2017-04-18 DIAGNOSIS — E119 Type 2 diabetes mellitus without complications: Secondary | ICD-10-CM | POA: Diagnosis not present

## 2017-04-18 DIAGNOSIS — E08641 Diabetes mellitus due to underlying condition with hypoglycemia with coma: Secondary | ICD-10-CM | POA: Diagnosis not present

## 2017-04-18 DIAGNOSIS — E440 Moderate protein-calorie malnutrition: Secondary | ICD-10-CM | POA: Diagnosis not present

## 2017-05-09 ENCOUNTER — Encounter: Payer: Self-pay | Admitting: Adult Health

## 2017-05-09 ENCOUNTER — Non-Acute Institutional Stay: Payer: Medicare Other | Admitting: Adult Health

## 2017-05-09 DIAGNOSIS — R319 Hematuria, unspecified: Secondary | ICD-10-CM

## 2017-05-09 NOTE — Progress Notes (Signed)
Location:  Occupational psychologist of Service:  ALF (13) Provider:   Cindi Carbon, ANP [place] [phone]   Gayland Curry, DO  Patient Care Team: Gayland Curry, DO as PCP - General (Geriatric Medicine) Irine Seal, MD as Attending Physician (Urology) Jodi Marble, MD as Consulting Physician (Otolaryngology) Martinique, Amy, MD as Consulting Physician (Dermatology) Gerda Diss, DO as Consulting Physician (Family Medicine) Deneise Lever, MD as Consulting Physician (Pulmonary Disease) Arta Silence, MD as Consulting Physician (Gastroenterology) Dohmeier, Asencion Partridge, MD as Consulting Physician (Neurology) Geanie Kenning, MD as Consulting Physician (Psychiatry)  Extended Emergency Contact Information Primary Emergency Contact: [name] Address: [address]          [place], [place] [address] [name] of [place] Phone: [phone] Mobile Phone: [phone] Relation: Spouse  Code Status:  DNR Goals of care: Advanced Directive information Advanced Directives 04/17/2017  Does Patient Have a Medical Advance Directive? Yes  Type of Advance Directive Out of facility DNR (pink MOST or yellow form);Living will;Healthcare Power of Attorney  Does patient want to make changes to medical advance directive? -  Copy of [place] in Chart? Yes  Would patient like information on creating a medical advance directive? -  Pre-existing out of facility DNR order (yellow form or pink MOST form) Yellow form placed in chart (order not valid for inpatient use)     Chief Complaint  Patient presents with  . Acute Visit    hematuria    HPI:  Pt is a 76 y.o. female seen today for an acute visit for hematuria. She has dementia and resides in the memory care unit. Staff report that she had two episodes of cranberry colored urine on the night of 8/23.  This am (8/24) she voided and the urine was amber colored.  She denies any  abd or back pain, no dysuria/frequency. No fever. She is ambulatory and was slow to arise this am but after my exam she got up and got dressed and feels better.  Last BM 8/23 In review of the records in epic, she has had several renal stone extractions in the past. Due to her advanced dementia she could not provide a history. We have avoided sending her out to the hospital unless absolutely necessary as she can become agitated and anxious with changes in environment. Currently her mental status is at baseline. (Oriented to self only, ambulatory, alert)  Past Medical History:  Diagnosis Date  . Arthritis    back and neck  . Carotid artery stenosis   . Cerebral amyloid angiopathy (CODE) 06/20/2016  . Chronic rhinosinusitis   . DDD (degenerative disc disease)   . Depression   . Diverticulosis of colon   . Frequency of urination   . Glaucoma   . Headache disorder 12/13/2014   Right temple  . Hearing loss   . History of kidney stones    MULTIBLE SURGICAL INTERVENTIONS  . History of skin cancer HX TOPICAL FACIAL [place] 2013  . History of TIA (transient ischemic attack)    NOV 2013-- NO RESIDUAL  . Hypertension   . IBS (irritable bowel syndrome)   . Memory difficulty 05/14/2016  . Memory loss   . Mild asthma    COLD INDUCED  . Right ureteral stone   . Seasonal and perennial allergic rhinitis   . Shingles    EYEBALL 09-17-2013  . Spondylosis    Past Surgical History:  Procedure Laterality Date  .  BUNIONECTOMY     BILATERAL  . CALDWELL LUC     sinus drainage procedure  . CYSTO/ BILATERAL URETEROSCOPIC STONE EXTRACTIONS  02-09-2003  . CYSTO/ BILATERAL URETEROSCOPY/  LASER OF STONES LEFT SIDE  06-02-2007  . CYSTOSCOPY/RETROGRADE/URETEROSCOPY/STONE EXTRACTION WITH BASKET  03/31/2012   Procedure: CYSTOSCOPY/RETROGRADE/URETEROSCOPY/STONE EXTRACTION WITH BASKET;  Surgeon: Malka So, MD;  Location[facility];  Service: Urology;  Laterality: Right;   .  CYSTOSCOPY/RETROGRADE/URETEROSCOPY/STONE EXTRACTION WITH BASKET Right 03/29/2013   Procedure: CYSTOSCOPY//URETEROSCOPY/STONE EXTRACTION WITH BASKET;  Surgeon: Malka So, MD;  Location: [facility];  Service: Urology;  Laterality: Right;  . CYSTOSCOPY/RETROGRADE/URETEROSCOPY/STONE EXTRACTION WITH BASKET Right 10/14/2013   Procedure: CYSTOSCOPY/RETROGRADE/URETEROSCOPY/STONE EXTRACTION WITH BASKET;  Surgeon: Irine Seal, MD;  Location: [facility];  Service: Urology;  Laterality: Right;  . ESOPHAGOGASTRODUODENOSCOPY (EGD) WITH PROPOFOL N/A 04/12/2015   Procedure: ESOPHAGOGASTRODUODENOSCOPY (EGD) WITH PROPOFOL;  Surgeon: Arta Silence, MD;  Location: [facility];  Service: Endoscopy;  Laterality: N/A;  . EUS N/A 04/12/2015   Procedure: ESOPHAGEAL ENDOSCOPIC ULTRASOUND (EUS) RADIAL;  Surgeon: Arta Silence, MD;  Location: [facility];  Service: Endoscopy;  Laterality: N/A;  . EXTRACORPOREAL SHOCK WAVE LITHOTRIPSY  about 1607/ Dr Reece Agar   Never completed procedure-could not tolerate pain. Had seizures due to pain  . EYE SURGERY  AGE 35   INJURY  . HERNIA REPAIR  AGE 60  . HOLMIUM LASER APPLICATION Right 3/71/0626   Procedure: HOLMIUM LASER APPLICATION;  Surgeon: Irine Seal, MD;  Location: [facility];  Service: Urology;  Laterality: Right;  . LAPAROSCOPIC CHOLECYSTECTOMY  01-12-2005  . LEFT URETEROSCOPIC STONE EXTRACTION    LAST ONE 04-28-2009   SEVERAL SURGICAL STONE EXTRACTIONS  . LUMBAR LAMINECTOMY  09-30-2002   L4 - 5;  SPONDYLOLISTHISES W/ STENOSIS AND RADICULOPATHY  . PERCUTANEOUS NEPHROSTOLITHOTOMY  1975  . RIGHT URETEROSCOPIC STONE EXTRACTION  LAST ONE 02-21-2011   MULTIPLE SURGICAL STONE EXTRACTIONS  . TONSILECTOMY, ADENOIDECTOMY, BILATERAL MYRINGOTOMY AND TUBES  CHILD  . VAGINAL HYSTERECTOMY  1991   W/ BILATERAL SALPINGOOPHECTOMY    Allergies  Allergen Reactions  . Donepezil Hcl   . Ace Inhibitors Cough  . Hctz [Hydrochlorothiazide]     Bladder  problems  . Triptans     Avoid per neuro    Outpatient Encounter Prescriptions as of 05/09/2017  Medication Sig  . aliskiren (TEKTURNA) 150 MG tablet Take 0.5 tablets (75 mg total) by mouth daily.  Marland Kitchen aspirin EC 81 MG tablet Take 81 mg by mouth daily.  Marland Kitchen atorvastatin (LIPITOR) 10 MG tablet Take 10 mg by mouth daily at 6 PM.  . Cranberry 500 MG TABS Take 1 tablet by mouth daily.  Marland Kitchen docusate sodium (COLACE) 100 MG capsule Take 100 mg by mouth daily as needed for mild constipation or moderate constipation.  . DULoxetine (CYMBALTA) 30 MG capsule Take 60 mg by mouth daily.   Marland Kitchen estradiol (ESTRACE) 1 MG tablet Take 1 mg by mouth every other day.   . feeding supplement (BOOST / RESOURCE BREEZE) LIQD Take 1 Container by mouth daily.   Marland Kitchen loperamide (IMODIUM) 2 MG capsule Take 2 mg by mouth 4 (four) times daily as needed for diarrhea or loose stools.  Marland Kitchen LORazepam (ATIVAN) 1 MG tablet Take 0.5 mg by mouth 3 (three) times daily with meals. May also take 0.5 mg by mouth every 4 hours as needed for agitation or restlessness  . Melatonin 5 MG CAPS Take 5 mg by mouth at bedtime.  Marland Kitchen OLANZapine (ZYPREXA) 10  MG tablet Take 10 mg by mouth at bedtime.  . temazepam (RESTORIL) 15 MG capsule Take 15 mg by mouth at bedtime.  . traMADol (ULTRAM) 50 MG tablet Take 50 mg by mouth every 6 (six) hours as needed for moderate pain or severe pain.   . traZODone (DESYREL) 100 MG tablet Take 150 mg by mouth at bedtime.    No facility-administered encounter medications on file as of 05/09/2017.     Review of Systems  Constitutional: Positive for activity change. Negative for appetite change, chills, diaphoresis, fatigue, fever and unexpected weight change.  HENT: Negative for congestion.   Respiratory: Negative for cough, shortness of breath and wheezing.   Cardiovascular: Negative for chest pain, palpitations and leg swelling.  Gastrointestinal: Negative for abdominal distention, abdominal pain, blood in stool, constipation  and diarrhea.  Genitourinary: Positive for hematuria. Negative for decreased urine volume, difficulty urinating, dysuria, flank pain, frequency, genital sores, menstrual problem, pelvic pain and urgency.  Musculoskeletal: Negative for arthralgias, back pain, gait problem, joint swelling and myalgias.  Neurological: Negative for dizziness, tremors, seizures, syncope, facial asymmetry, speech difficulty, weakness, light-headedness, numbness and headaches.  Psychiatric/Behavioral: Positive for agitation (at times) and confusion. Negative for behavioral problems.    Immunization History  Administered Date(s) Administered  . Hepatitis A 11/15/2003, 06/11/2004, 12/25/2012  . Hepatitis B 11/14/2008, 12/15/2008  . Influenza Split 06/17/2011  . Influenza Whole 06/16/2010  . Influenza, High Dose Seasonal PF 05/27/2016  . Pneumococcal Conjugate-13 08/09/2014  . Pneumococcal Polysaccharide-23 04/22/2011, 11/15/2011  . Tdap 11/14/2008, 12/25/2012  . Typhoid Inactivated 12/25/2012  . Yellow Fever 12/25/2012  . Zoster 05/07/2012  . Zoster Recombinat (Shingrix) 01/03/2017, 04/04/2017   Pertinent  Health Maintenance Due  Topic Date Due  . COLONOSCOPY  01/31/1993  . DEXA SCAN  02/01/2008  . INFLUENZA VACCINE  04/16/2017  . MAMMOGRAM  07/23/2018  . PNA vac Low Risk Adult  Completed   Fall Risk  10/29/2016  Falls in the past year? Yes  Number falls in past yr: 2 or more  Injury with Fall? Yes  Follow up Falls prevention discussed   Functional Status Survey:    Vitals:   05/09/17 0936  BP: 126/65  Pulse: 78  Resp: 17  Temp: (!) 97.3 F (36.3 C)  SpO2: 98%   There is no height or weight on file to calculate BMI. Physical Exam  Constitutional: No distress.  HENT:  Head: Normocephalic and atraumatic.  Neck: No JVD present.  Cardiovascular: Normal rate and regular rhythm.   No murmur heard. Pulmonary/Chest: Effort normal and breath sounds normal. No respiratory distress. She has no  wheezes.  Abdominal: Soft. Bowel sounds are normal. She exhibits no distension. There is no tenderness. There is no rebound.  Genitourinary: Rectum normal and vagina normal. There is no rash or tenderness on the right labia. There is no rash or tenderness on the left labia. No vaginal discharge found.  Neurological: She is alert.  Oriented to self, able to f/c  Skin: Skin is warm and dry. She is not diaphoretic.  Psychiatric: She has a normal mood and affect.  Vitals reviewed.   Labs reviewed:  Recent Labs  07/15/16 0444 08/22/16 1047 09/26/16 2344 01/20/17 0700 03/10/17 1122 03/13/17  NA 139 142 140 142 139 142  K 3.9 3.9 4.2 4.4 3.2* 4.2  CL 107 105 108  --   --   --   CO2 25 27 26   --   --   --   GLUCOSE  110* 89 108*  --   --   --   BUN 11 15 17 13 18 19   CREATININE 0.95 1.08 1.12* 0.8 0.9 1.0  CALCIUM 9.2 9.9 9.0  --   --   --     Recent Labs  07/14/16 1909 07/15/16 0444 01/20/17 0700  AST 26 22 19   ALT 18 16 12   ALKPHOS 53 45 54  BILITOT 0.7 0.9  --   PROT 6.4* 5.6*  --   ALBUMIN 3.8 3.3*  --     Recent Labs  07/14/16 1909  07/15/16 0444 08/22/16 1047 09/26/16 2344 01/20/17 0700 03/10/17 1122 03/13/17  WBC 7.7  --  6.1 7.6 6.8 7.5 14.1 6.6  NEUTROABS 4.3  --   --  5.1  --   --   --  5  HGB 14.5  < > 13.4 15.2* 13.8 14.2 12.8 12.8  HCT 42.4  < > 39.7 43.9 40.3 44 39 39  MCV 95.7  --  95.4 95.3 93.9  --   --   --   PLT 241  --  196 247.0 143* 184 156 240  < > = values in this interval not displayed. Lab Results  Component Value Date   TSH 0.832 09/28/2016   Lab Results  Component Value Date   HGBA1C 5.2 07/15/2016   Lab Results  Component Value Date   CHOL 170 07/15/2016   HDL 59 07/15/2016   LDLCALC 90 07/15/2016   TRIG 106 07/15/2016   CHOLHDL 2.9 07/15/2016    Significant Diagnostic Results in last 30 days:  No results found.  Assessment/Plan  1) Hematuria Not painful, no fever. Turned amber this am. VS qshift x 72 hrs UA C and S  via I and O cath Monitor for back pain, dysuria, or fever She has previously been followed by Dr. Jeffie Pollock and we can refer her back for painless hematuria if this reoccurs but given her dementia/agitation she would not be a good candidate for aggressive measures.    Family/ staff Communication: discussed with resident  Labs/tests ordered:  CBC BMP UA C and S

## 2017-05-10 DIAGNOSIS — N390 Urinary tract infection, site not specified: Secondary | ICD-10-CM | POA: Diagnosis not present

## 2017-05-10 DIAGNOSIS — D649 Anemia, unspecified: Secondary | ICD-10-CM | POA: Diagnosis not present

## 2017-05-10 DIAGNOSIS — R319 Hematuria, unspecified: Secondary | ICD-10-CM | POA: Diagnosis not present

## 2017-05-12 DIAGNOSIS — R109 Unspecified abdominal pain: Secondary | ICD-10-CM | POA: Diagnosis not present

## 2017-05-13 DIAGNOSIS — R319 Hematuria, unspecified: Secondary | ICD-10-CM | POA: Diagnosis not present

## 2017-07-08 DIAGNOSIS — B351 Tinea unguium: Secondary | ICD-10-CM | POA: Diagnosis not present

## 2017-07-08 DIAGNOSIS — L84 Corns and callosities: Secondary | ICD-10-CM | POA: Diagnosis not present

## 2017-07-10 DIAGNOSIS — Z23 Encounter for immunization: Secondary | ICD-10-CM | POA: Diagnosis not present

## 2017-07-29 ENCOUNTER — Non-Acute Institutional Stay: Payer: Medicare Other | Admitting: Internal Medicine

## 2017-07-29 ENCOUNTER — Encounter: Payer: Self-pay | Admitting: Internal Medicine

## 2017-07-29 DIAGNOSIS — G459 Transient cerebral ischemic attack, unspecified: Secondary | ICD-10-CM

## 2017-07-29 DIAGNOSIS — E210 Primary hyperparathyroidism: Secondary | ICD-10-CM

## 2017-07-29 DIAGNOSIS — I10 Essential (primary) hypertension: Secondary | ICD-10-CM | POA: Diagnosis not present

## 2017-07-29 DIAGNOSIS — Z8719 Personal history of other diseases of the digestive system: Secondary | ICD-10-CM

## 2017-07-29 DIAGNOSIS — F482 Pseudobulbar affect: Secondary | ICD-10-CM

## 2017-07-29 DIAGNOSIS — I680 Cerebral amyloid angiopathy: Secondary | ICD-10-CM | POA: Diagnosis not present

## 2017-07-29 DIAGNOSIS — F22 Delusional disorders: Secondary | ICD-10-CM

## 2017-07-29 DIAGNOSIS — F5105 Insomnia due to other mental disorder: Secondary | ICD-10-CM

## 2017-07-29 DIAGNOSIS — R4701 Aphasia: Secondary | ICD-10-CM | POA: Diagnosis not present

## 2017-07-29 NOTE — Progress Notes (Signed)
Patient ID: Ashley [name], female   DOB: [birth year], 76 y.o.   MRN: [id number]  Location:  [facility] Room Number: 342 memory care Place of Service:  ALF [phone]) Provider:   Gayland Curry, DO  Patient Care Team: Gayland Curry, DO as PCP - General (Geriatric Medicine) Irine Seal, MD as Attending Physician (Urology) Jodi Marble, MD as Consulting Physician (Otolaryngology) Martinique, Amy, MD as Consulting Physician (Dermatology) Gerda Diss, DO as Consulting Physician (Family Medicine) Deneise Lever, MD as Consulting Physician (Pulmonary Disease) Arta Silence, MD as Consulting Physician (Gastroenterology) Dohmeier, Asencion Partridge, MD as Consulting Physician (Neurology) Geanie Kenning, MD as Consulting Physician (Psychiatry)  Extended Emergency Contact Information Primary Emergency Contact: [name] Address: [address]          [place], [place] [address] [name] of [place] Phone: [phone] Mobile Phone: [phone] Relation: Spouse  Code Status:  DNR Goals of care: Advanced Directive information Advanced Directives 07/29/2017  Does Patient Have a Medical Advance Directive? Yes  Type of Advance Directive Out of facility DNR (pink MOST or yellow form);Living will;Healthcare Power of Attorney  Does patient want to make changes to medical advance directive? No - Patient declined  Copy of [place] in Chart? Yes  Would patient like information on creating a medical advance directive? No - Patient declined  Pre-existing out of facility DNR order (yellow form or pink MOST form) Yellow form placed in chart (order not valid for inpatient use)   Chief Complaint  Patient presents with  . Medical Management of Chronic Issues    routine visit    HPI:  Pt is a 76 y.o. female seen today for medical management of chronic diseases.  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  Last MMSE was 13/30 on 01/28/17.    Staff report that recently, Ashley [name] has been busier--wandering around more like she is still working and in everything.  She is also nasty to them at times if they don't answer her the way she expects in her own delusional state.  She's been easily agitated.  She has also been crying out of the blue without anything to clearly cause sadness.    She's been "sitting down" everywhere and staff will find her this way fortunately without injury.  It's not clear if she truly falls or sits down in that position.    She is followed by Dr. Geanie Kenning, psychiatry.  She has not been going out to appointments due to concerns about this worsening her behaviors.  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   She is currently maintained on trazodone for insomnia 150mg  at hs, temazepam 15mg  at bedtime for insomnia and anxiety, zyprexa 10mg  at bedtime for the delusional state, melatonin for insomnia (doubt it's doing much with those others still needed), ativan three times a day with meals for anxiety, cymbalta 30mg  daily for depression.    She remains on estrace hormones at 76yo, as well.    HTN:  bp well controlled on aliskiren only.    Hyperlipidemia:  Remains on low dose statin therapy.  Has had a prior TIA.  Is also on baby asa.    Weight has trended up not down.  Past Medical History:  Diagnosis Date  . Arthritis    back and neck  . Carotid artery stenosis   . Cerebral amyloid angiopathy (CODE) 06/20/2016  . Chronic rhinosinusitis   .  DDD (degenerative disc disease)   . Depression   . Diverticulosis of colon   . Frequency of urination   . Glaucoma   . Headache disorder 12/13/2014   Right temple  . Hearing loss   . History of kidney stones    MULTIBLE SURGICAL INTERVENTIONS  . History of skin cancer HX TOPICAL FACIAL [place] 2013  . History of TIA (transient ischemic attack)    NOV 2013-- NO RESIDUAL  . Hypertension   . IBS (irritable  bowel syndrome)   . Memory difficulty 05/14/2016  . Memory loss   . Mild asthma    COLD INDUCED  . Right ureteral stone   . Seasonal and perennial allergic rhinitis   . Shingles    EYEBALL 09-17-2013  . Spondylosis    Past Surgical History:  Procedure Laterality Date  . BUNIONECTOMY     BILATERAL  . CALDWELL LUC     sinus drainage procedure  . CYSTO/ BILATERAL URETEROSCOPIC STONE EXTRACTIONS  02-09-2003  . CYSTO/ BILATERAL URETEROSCOPY/  LASER OF STONES LEFT SIDE  06-02-2007  . EXTRACORPOREAL SHOCK WAVE LITHOTRIPSY  about 6734/ Dr Reece Agar   Never completed procedure-could not tolerate pain. Had seizures due to pain  . EYE SURGERY  AGE 11   INJURY  . HERNIA REPAIR  AGE 84  . LAPAROSCOPIC CHOLECYSTECTOMY  01-12-2005  . LEFT URETEROSCOPIC STONE EXTRACTION    LAST ONE 04-28-2009   SEVERAL SURGICAL STONE EXTRACTIONS  . LUMBAR LAMINECTOMY  09-30-2002   L4 - 5;  SPONDYLOLISTHISES W/ STENOSIS AND RADICULOPATHY  . PERCUTANEOUS NEPHROSTOLITHOTOMY  1975  . RIGHT URETEROSCOPIC STONE EXTRACTION  LAST ONE 02-21-2011   MULTIPLE SURGICAL STONE EXTRACTIONS  . TONSILECTOMY, ADENOIDECTOMY, BILATERAL MYRINGOTOMY AND TUBES  CHILD  . VAGINAL HYSTERECTOMY  1991   W/ BILATERAL SALPINGOOPHECTOMY    Allergies  Allergen Reactions  . Donepezil Hcl   . Ace Inhibitors Cough  . Hctz [Hydrochlorothiazide]     Bladder problems  . Triptans     Avoid per neuro    Outpatient Encounter Medications as of 07/29/2017  Medication Sig  . aliskiren (TEKTURNA) 150 MG tablet Take 0.5 tablets (75 mg total) by mouth daily.  Marland Kitchen aspirin EC 81 MG tablet Take 81 mg by mouth daily.  Marland Kitchen atorvastatin (LIPITOR) 10 MG tablet Take 10 mg by mouth daily at 6 PM.  . Cranberry 500 MG TABS Take 1 tablet by mouth daily.  Marland Kitchen docusate sodium (COLACE) 100 MG capsule Take 100 mg by mouth daily as needed for mild constipation or moderate constipation.  . DULoxetine (CYMBALTA) 30 MG capsule Take 60 mg by mouth daily.   Marland Kitchen estradiol  (ESTRACE) 1 MG tablet Take 1 mg by mouth every other day.   Marland Kitchen LORazepam (ATIVAN) 1 MG tablet Take 0.5 mg by mouth 3 (three) times daily with meals. May also take 0.5 mg by mouth every 4 hours as needed for agitation or restlessness  . Melatonin 5 MG CAPS Take 5 mg by mouth at bedtime.  . Multiple Vitamins-Minerals (MULTIVITAMINS THER. W/MINERALS) TABS tablet Take 1 tablet daily by mouth.  . OLANZapine (ZYPREXA) 10 MG tablet Take 10 mg by mouth at bedtime.  . temazepam (RESTORIL) 15 MG capsule Take 15 mg by mouth at bedtime.  . traZODone (DESYREL) 100 MG tablet Take 150 mg by mouth at bedtime.   . [DISCONTINUED] feeding supplement (BOOST / RESOURCE BREEZE) LIQD Take 1 Container by mouth daily.   . [DISCONTINUED] loperamide (IMODIUM) 2 MG capsule Take  2 mg by mouth 4 (four) times daily as needed for diarrhea or loose stools.  . [DISCONTINUED] traMADol (ULTRAM) 50 MG tablet Take 50 mg by mouth every 6 (six) hours as needed for moderate pain or severe pain.    No facility-administered encounter medications on file as of 07/29/2017.     Review of Systems  Constitutional: Positive for activity change. Negative for appetite change, chills and fever.  HENT: Negative for congestion.   Eyes: Negative for visual disturbance.  Respiratory: Negative for cough, chest tightness and shortness of breath.   Cardiovascular: Negative for chest pain, palpitations and leg swelling.  Gastrointestinal: Positive for constipation. Negative for abdominal pain, diarrhea, nausea and vomiting.  Genitourinary: Negative for dysuria.  Musculoskeletal: Positive for gait problem.       Being found sitting on the floor more often  Skin: Negative for color change.  Neurological: Negative for dizziness, weakness and headaches.  Hematological: Does not bruise/bleed easily.  Psychiatric/Behavioral: Positive for agitation, behavioral problems and confusion. The patient is nervous/anxious.     Immunization History    Administered Date(s) Administered  . Hepatitis A 11/15/2003, 06/11/2004, 12/25/2012  . Hepatitis B 11/14/2008, 12/15/2008  . Influenza Split 06/17/2011  . Influenza Whole 06/16/2010  . Influenza, High Dose Seasonal PF 05/27/2016  . Influenza-Unspecified 07/10/2017  . Pneumococcal Conjugate-13 08/09/2014  . Pneumococcal Polysaccharide-23 04/22/2011, 11/15/2011  . Tdap 11/14/2008, 12/25/2012  . Typhoid Inactivated 12/25/2012  . Yellow Fever 12/25/2012  . Zoster 05/07/2012  . Zoster Recombinat (Shingrix) 01/03/2017, 04/04/2017   Pertinent  Health Maintenance Due  Topic Date Due  . COLONOSCOPY  01/31/1993  . DEXA SCAN  02/01/2008  . INFLUENZA VACCINE  04/16/2017  . MAMMOGRAM  07/23/2018  . PNA vac Low Risk Adult  Completed   Fall Risk  10/29/2016  Falls in the past year? Yes  Number falls in past yr: 2 or more  Injury with Fall? Yes  Follow up Falls prevention discussed   Functional Status Survey:    Vitals:   07/29/17 1536  BP: 126/81  Pulse: 85  Resp: 18  Temp: 98.9 F (37.2 C)  TempSrc: Oral  SpO2: 96%  Weight: 128 lb (58.1 kg)   Body mass index is 22.67 kg/m. Physical Exam  Constitutional: No distress.  Thin white female, wandering around the memory care unit--I've never seen her sit  HENT:  Head: Normocephalic and atraumatic.  Cardiovascular: Normal rate, regular rhythm, normal heart sounds and intact distal pulses.  Pulmonary/Chest: Effort normal and breath sounds normal. No respiratory distress. She has no wheezes.  Abdominal: Soft. Bowel sounds are normal. She exhibits no distension. There is no tenderness.  Musculoskeletal: Normal range of motion.  Neurological: She is alert. No cranial nerve deficit. She exhibits normal muscle tone.  Skin: There is pallor.  Psychiatric:  Pleasant, tangential speech, not tearful at present, but I've seen her tearful most times I've been in the unit; always asks questions about staffing and seems to consider me in charge  when I'm there    Labs reviewed: Recent Labs    08/22/16 1047 09/26/16 2344 01/20/17 0700 03/10/17 1122 03/13/17  NA 142 140 142 139 142  K 3.9 4.2 4.4 3.2* 4.2  CL 105 108  --   --   --   CO2 27 26  --   --   --   GLUCOSE 89 108*  --   --   --   BUN 15 17 13 18 19   CREATININE 1.08 1.12*  0.8 0.9 1.0  CALCIUM 9.9 9.0  --   --   --    Recent Labs    01/20/17 0700  AST 19  ALT 12  ALKPHOS 54   Recent Labs    08/22/16 1047 09/26/16 2344 01/20/17 0700 03/10/17 1122 03/13/17  WBC 7.6 6.8 7.5 14.1 6.6  NEUTROABS 5.1  --   --   --  5  HGB 15.2* 13.8 14.2 12.8 12.8  HCT 43.9 40.3 44 39 39  MCV 95.3 93.9  --   --   --   PLT 247.0 143* 184 156 240   Lab Results  Component Value Date   TSH 0.832 09/28/2016   Lab Results  Component Value Date   HGBA1C 5.2 07/15/2016   Lab Results  Component Value Date   CHOL 170 07/15/2016   HDL 59 07/15/2016   LDLCALC 90 07/15/2016   TRIG 106 07/15/2016   CHOLHDL 2.9 07/15/2016    Assessment/Plan 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 Expressive aphasia -ongoing, likely all due to #2, but also has h/o TIA, ? Small strokes in the past -cont memory care support  5. Hyperparathyroidism, primary ([place]) -monitor PTH, iCa and Vitamin D3 levels about  once a year, has had kidney and ureteral stones as a result with some small ones known, was followed by urology outpatient, but trying to avoid taking her out lately due to changes in routine worsening her dementia so much  6. Essential hypertension -bp at goal with current therapy  7. TIA (transient ischemic attack) -in the past, seems she may have some vascular dementia, too  8. History of IBS -lately on colace daily prn for constipation  9.  Insomnia:   -severe, has required 3 agents for this, though doubt the melatonin is doing much with a benzo and trazodone on board  Family/ staff Communication: discussed with memory care nurse  Labs/tests ordered:  Check PTH, iCa and vitamin D3 annually   Favor d/c asa, statin and melatonin since trazodone had to be added--need to discuss with her husband before changing.  Zakariya Knickerbocker L. Jilberto Vanderwall, D.O. [facility] [address], [place] [address] Cell Phone (Mon-Fri 8am-5pm):  [phone] On Call:  [phone] & follow prompts after 5pm & weekends Office Phone:  [phone] Office Fax:  [phone]

## 2017-08-10 DIAGNOSIS — F482 Pseudobulbar affect: Secondary | ICD-10-CM | POA: Insufficient documentation

## 2017-08-10 DIAGNOSIS — F5105 Insomnia due to other mental disorder: Secondary | ICD-10-CM | POA: Insufficient documentation

## 2017-11-11 DIAGNOSIS — L84 Corns and callosities: Secondary | ICD-10-CM | POA: Diagnosis not present

## 2017-11-11 DIAGNOSIS — B351 Tinea unguium: Secondary | ICD-10-CM | POA: Diagnosis not present

## 2017-11-13 ENCOUNTER — Encounter: Payer: Self-pay | Admitting: Adult Health

## 2017-11-13 ENCOUNTER — Non-Acute Institutional Stay: Payer: Medicare Other | Admitting: Adult Health

## 2017-11-13 DIAGNOSIS — G459 Transient cerebral ischemic attack, unspecified: Secondary | ICD-10-CM | POA: Diagnosis not present

## 2017-11-13 DIAGNOSIS — E210 Primary hyperparathyroidism: Secondary | ICD-10-CM

## 2017-11-13 DIAGNOSIS — I10 Essential (primary) hypertension: Secondary | ICD-10-CM | POA: Diagnosis not present

## 2017-11-13 DIAGNOSIS — E782 Mixed hyperlipidemia: Secondary | ICD-10-CM

## 2017-11-13 DIAGNOSIS — I680 Cerebral amyloid angiopathy: Secondary | ICD-10-CM | POA: Diagnosis not present

## 2017-11-13 NOTE — Progress Notes (Signed)
Location:  [facility] of Service:  ALF (13) Provider:   Cindi Carbon, [place] [phone]  Gayland Curry, DO  Patient Care Team: Gayland Curry, DO as PCP - General (Geriatric Medicine) Irine Seal, MD as Attending Physician (Urology) Jodi Marble, MD as Consulting Physician (Otolaryngology) Martinique, Amy, MD as Consulting Physician (Dermatology) Gerda Diss, DO as Consulting Physician (Family Medicine) Deneise Lever, MD as Consulting Physician (Pulmonary Disease) Arta Silence, MD as Consulting Physician (Gastroenterology) Dohmeier, Asencion Partridge, MD as Consulting Physician (Neurology) Geanie Kenning, MD as Consulting Physician (Psychiatry)  Extended Emergency Contact Information Primary Emergency Contact: [name] Address: [address]          [place],  [address] [name] of [place] Phone: [phone] Mobile Phone: [phone] Relation: Spouse  Code Status:  DNR Goals of care: Advanced Directive information Advanced Directives 07/29/2017  Does Patient Have a Medical Advance Directive? Yes  Type of Advance Directive Out of facility DNR (pink MOST or yellow form);Living will;Healthcare Power of Attorney  Does patient want to make changes to medical advance directive? No - Patient declined  Copy of [place] in Chart? Yes  Would patient like information on creating a medical advance directive? No - Patient declined  Pre-existing out of facility DNR order (yellow form or pink MOST form) Yellow form placed in chart (order not valid for inpatient use)     Chief Complaint  Patient presents with  . Medical Management of Chronic Issues    HPI:  Pt is a 77 y.o. female seen today for medical management of chronic diseases.  She resides in the memory care unit and spends most of the day wandering throughout the facility.  Cerebral amyloid angiopathy: Her behaviors have improved  over the past few months in the sense that she is not as anxious or tearful as she used to be, per the staff. For my visit she is pleasant and able to follow directions but easily distracted. MMSE on 08/07/17 was unfortunately 2/30 as she was not able to complete most of the items.  Hyperparathyroidism: no symptoms, needs updated labs Hx of renal calculi  HTN: controlled  Hx of TIA on aspirin  HLD Lab Results  Component Value Date   [place] 90 07/15/2016    Past Medical History:  Diagnosis Date  . Arthritis    back and neck  . Carotid artery stenosis   . Cerebral amyloid angiopathy (CODE) 06/20/2016  . Chronic rhinosinusitis   . DDD (degenerative disc disease)   . Depression   . Diverticulosis of colon   . Frequency of urination   . Glaucoma   . Headache disorder 12/13/2014   Right temple  . Hearing loss   . History of kidney stones    MULTIBLE SURGICAL INTERVENTIONS  . History of skin cancer HX TOPICAL FACIAL [place] 2013  . History of TIA (transient ischemic attack)    NOV 2013-- NO RESIDUAL  . Hypertension   . IBS (irritable bowel syndrome)   . Memory difficulty 05/14/2016  . Memory loss   . Mild asthma    COLD INDUCED  . Right ureteral stone   . Seasonal and perennial allergic rhinitis   . Shingles    EYEBALL 09-17-2013  . Spondylosis    Past Surgical History:  Procedure Laterality Date  . BUNIONECTOMY     BILATERAL  . CALDWELL LUC     sinus drainage procedure  . CYSTO/  BILATERAL URETEROSCOPIC STONE EXTRACTIONS  02-09-2003  . CYSTO/ BILATERAL URETEROSCOPY/  LASER OF STONES LEFT SIDE  06-02-2007  . CYSTOSCOPY/RETROGRADE/URETEROSCOPY/STONE EXTRACTION WITH BASKET  03/31/2012   Procedure: CYSTOSCOPY/RETROGRADE/URETEROSCOPY/STONE EXTRACTION WITH BASKET;  Surgeon: Malka So, MD;  [facility];  Service: Urology;  Laterality: Right;   . CYSTOSCOPY/RETROGRADE/URETEROSCOPY/STONE EXTRACTION WITH BASKET Right 03/29/2013   Procedure:  CYSTOSCOPY//URETEROSCOPY/STONE EXTRACTION WITH BASKET;  Surgeon: Malka So, MD;  Location: [facility];  Service: Urology;  Laterality: Right;  . CYSTOSCOPY/RETROGRADE/URETEROSCOPY/STONE EXTRACTION WITH BASKET Right 10/14/2013   Procedure: CYSTOSCOPY/RETROGRADE/URETEROSCOPY/STONE EXTRACTION WITH BASKET;  Surgeon: Irine Seal, MD;  Location: [facility];  Service: Urology;  Laterality: Right;  . ESOPHAGOGASTRODUODENOSCOPY (EGD) WITH PROPOFOL N/A 04/12/2015   Procedure: ESOPHAGOGASTRODUODENOSCOPY (EGD) WITH PROPOFOL;  Surgeon: Arta Silence, MD;  Location: [facility];  Service: Endoscopy;  Laterality: N/A;  . EUS N/A 04/12/2015   Procedure: ESOPHAGEAL ENDOSCOPIC ULTRASOUND (EUS) RADIAL;  Surgeon: Arta Silence, MD;  Location: [facility];  Service: Endoscopy;  Laterality: N/A;  . EXTRACORPOREAL SHOCK WAVE LITHOTRIPSY  about 4401/ Dr Reece Agar   Never completed procedure-could not tolerate pain. Had seizures due to pain  . EYE SURGERY  AGE 73   INJURY  . HERNIA REPAIR  AGE 78  . HOLMIUM LASER APPLICATION Right 0/27/2536   Procedure: HOLMIUM LASER APPLICATION;  Surgeon: Irine Seal, MD;  Location: [facility];  Service: Urology;  Laterality: Right;  . LAPAROSCOPIC CHOLECYSTECTOMY  01-12-2005  . LEFT URETEROSCOPIC STONE EXTRACTION    LAST ONE 04-28-2009   SEVERAL SURGICAL STONE EXTRACTIONS  . LUMBAR LAMINECTOMY  09-30-2002   L4 - 5;  SPONDYLOLISTHISES W/ STENOSIS AND RADICULOPATHY  . PERCUTANEOUS NEPHROSTOLITHOTOMY  1975  . RIGHT URETEROSCOPIC STONE EXTRACTION  LAST ONE 02-21-2011   MULTIPLE SURGICAL STONE EXTRACTIONS  . TONSILECTOMY, ADENOIDECTOMY, BILATERAL MYRINGOTOMY AND TUBES  CHILD  . VAGINAL HYSTERECTOMY  1991   W/ BILATERAL SALPINGOOPHECTOMY    Allergies  Allergen Reactions  . Donepezil Hcl   . Ace Inhibitors Cough  . Hctz [Hydrochlorothiazide]     Bladder problems  . Triptans     Avoid per neuro    Outpatient Encounter Medications as of 11/13/2017    Medication Sig  . aliskiren (TEKTURNA) 150 MG tablet Take 0.5 tablets (75 mg total) by mouth daily.  Marland Kitchen aspirin EC 81 MG tablet Take 81 mg by mouth daily.  Marland Kitchen atorvastatin (LIPITOR) 10 MG tablet Take 10 mg by mouth daily at 6 PM.  . Cranberry 500 MG TABS Take 1 tablet by mouth daily.  Marland Kitchen docusate sodium (COLACE) 100 MG capsule Take 100 mg by mouth daily as needed for mild constipation or moderate constipation.  . DULoxetine (CYMBALTA) 30 MG capsule Take 60 mg by mouth daily.   Marland Kitchen estradiol (ESTRACE) 1 MG tablet Take 1 mg by mouth every other day.   Marland Kitchen LORazepam (ATIVAN) 1 MG tablet Take 0.5 mg by mouth 3 (three) times daily with meals. May also take 0.5 mg by mouth every 4 hours as needed for agitation or restlessness  . Multiple Vitamins-Minerals (MULTIVITAMINS THER. W/MINERALS) TABS tablet Take 1 tablet daily by mouth.  . OLANZapine (ZYPREXA) 10 MG tablet Take 10 mg by mouth at bedtime.  . temazepam (RESTORIL) 15 MG capsule Take 15 mg by mouth at bedtime.  . traZODone (DESYREL) 100 MG tablet Take 150 mg by mouth at bedtime.   . [DISCONTINUED] Melatonin 5 MG CAPS Take 5 mg by mouth at bedtime.  . [DISCONTINUED] montelukast (  SINGULAIR) 10 MG tablet Take 1 tablet (10 mg total) by mouth daily.   No facility-administered encounter medications on file as of 11/13/2017.     Review of Systems  Immunization History  Administered Date(s) Administered  . Hepatitis A 11/15/2003, 06/11/2004, 12/25/2012  . Hepatitis B 11/14/2008, 12/15/2008  . Influenza Split 06/17/2011  . Influenza Whole 06/16/2010  . Influenza, High Dose Seasonal PF 05/27/2016  . Influenza-Unspecified 07/10/2017  . Pneumococcal Conjugate-13 08/09/2014  . Pneumococcal Polysaccharide-23 04/22/2011, 11/15/2011  . Tdap 11/14/2008, 12/25/2012  . Typhoid Inactivated 12/25/2012  . Yellow Fever 12/25/2012  . Zoster 05/07/2012  . Zoster Recombinat (Shingrix) 01/03/2017, 04/04/2017   Pertinent  Health Maintenance Due  Topic Date Due   . DEXA SCAN  12/11/2017 (Originally 02/01/2008)  . MAMMOGRAM  07/23/2018  . INFLUENZA VACCINE  Completed  . PNA vac Low Risk Adult  Completed  . COLONOSCOPY  Discontinued   Fall Risk  10/29/2016  Falls in the past year? Yes  Number falls in past yr: 2 or more  Injury with Fall? Yes  Follow up Falls prevention discussed   Functional Status Survey:    Vitals:   11/13/17 1519  BP: (!) 141/80  Pulse: 87  Resp: 20  Temp: 98.7 F (37.1 C)  SpO2: 97%  Weight: 134 lb 12.8 oz (61.1 kg)   Body mass index is 23.88 kg/m. Physical Exam  Labs reviewed: Recent Labs    01/20/17 0700 03/10/17 1122 03/13/17  NA 142 139 142  K 4.4 3.2* 4.2  BUN 13 18 19   CREATININE 0.8 0.9 1.0   Recent Labs    01/20/17 0700  AST 19  ALT 12  ALKPHOS 54   Recent Labs    01/20/17 0700 03/10/17 1122 03/13/17  WBC 7.5 14.1 6.6  NEUTROABS  --   --  5  HGB 14.2 12.8 12.8  HCT 44 39 39  PLT 184 156 240   Lab Results  Component Value Date   TSH 0.832 09/28/2016   Lab Results  Component Value Date   HGBA1C 5.2 07/15/2016   Lab Results  Component Value Date   CHOL 170 07/15/2016   HDL 59 07/15/2016   LDLCALC 90 07/15/2016   TRIG 106 07/15/2016   CHOLHDL 2.9 07/15/2016    Significant Diagnostic Results in last 30 days:  No results found.  Assessment/Plan Cerebral amyloid angiopathy (CODE) Continue Zyprexa and Ativan to help with periods of anxiety and agitation.   Essential hypertension Controlled. Continue tekturna.  Hyperparathyroidism, primary With hx of renal stones. Update labs.   TIA (transient ischemic attack) Continue baby aspirin as prevention.  HLD (hyperlipidemia) Continues on lipitor with questionable benefit given her debility. Will check lipid panel as she is on zyprexa as well.     Family/ staff Communication: discussed with nurse  Labs/tests ordered:  Ionized Ca, PTH, Vitamin D, CBC, BMP, A1C, lipid panel

## 2017-11-14 DIAGNOSIS — E119 Type 2 diabetes mellitus without complications: Secondary | ICD-10-CM | POA: Diagnosis not present

## 2017-11-14 DIAGNOSIS — Z79899 Other long term (current) drug therapy: Secondary | ICD-10-CM | POA: Diagnosis not present

## 2017-11-14 DIAGNOSIS — E559 Vitamin D deficiency, unspecified: Secondary | ICD-10-CM | POA: Diagnosis not present

## 2017-11-14 DIAGNOSIS — R5383 Other fatigue: Secondary | ICD-10-CM | POA: Diagnosis not present

## 2017-11-14 DIAGNOSIS — E210 Primary hyperparathyroidism: Secondary | ICD-10-CM | POA: Diagnosis not present

## 2017-11-14 DIAGNOSIS — D649 Anemia, unspecified: Secondary | ICD-10-CM | POA: Diagnosis not present

## 2017-11-14 DIAGNOSIS — E785 Hyperlipidemia, unspecified: Secondary | ICD-10-CM | POA: Diagnosis not present

## 2017-11-14 LAB — BASIC METABOLIC PANEL
BUN: 15 (ref 4–21)
Creatinine: 0.9 (ref 0.5–1.1)
GLUCOSE: 98
Potassium: 4.9 (ref 3.4–5.3)
Sodium: 142 (ref 137–147)

## 2017-11-14 LAB — HEMOGLOBIN A1C: HEMOGLOBIN A1C: 5.4

## 2017-11-14 LAB — LIPID PANEL
CHOLESTEROL: 183 (ref 0–200)
HDL: 64 (ref 35–70)
LDL Cholesterol: 102
Triglycerides: 86 (ref 40–160)

## 2017-11-14 LAB — CBC AND DIFFERENTIAL
HEMATOCRIT: 41 (ref 36–46)
HEMOGLOBIN: 14 (ref 12.0–16.0)
PLATELETS: 191 (ref 150–399)
WBC: 6.4

## 2017-11-14 LAB — VITAMIN D 25 HYDROXY (VIT D DEFICIENCY, FRACTURES): VIT D 25 HYDROXY: 40

## 2017-11-15 DIAGNOSIS — E785 Hyperlipidemia, unspecified: Secondary | ICD-10-CM | POA: Insufficient documentation

## 2017-11-15 NOTE — Assessment & Plan Note (Signed)
With hx of renal stones. Update labs.

## 2017-11-15 NOTE — Assessment & Plan Note (Signed)
Continue Zyprexa and Ativan to help with periods of anxiety and agitation.

## 2017-11-15 NOTE — Assessment & Plan Note (Signed)
Continues on lipitor with questionable benefit given her debility. Will check lipid panel as she is on zyprexa as well.

## 2017-11-15 NOTE — Assessment & Plan Note (Signed)
Controlled. Continue tekturna.

## 2017-11-15 NOTE — Assessment & Plan Note (Signed)
Continue baby aspirin as prevention.

## 2017-11-17 DIAGNOSIS — E210 Primary hyperparathyroidism: Secondary | ICD-10-CM | POA: Diagnosis not present

## 2017-12-11 DIAGNOSIS — M25531 Pain in right wrist: Secondary | ICD-10-CM | POA: Diagnosis not present

## 2017-12-11 DIAGNOSIS — M79641 Pain in right hand: Secondary | ICD-10-CM | POA: Diagnosis not present

## 2018-01-01 ENCOUNTER — Non-Acute Institutional Stay: Payer: Medicare Other | Admitting: Adult Health

## 2018-01-01 ENCOUNTER — Encounter: Payer: Self-pay | Admitting: Adult Health

## 2018-01-01 DIAGNOSIS — F5105 Insomnia due to other mental disorder: Secondary | ICD-10-CM

## 2018-01-01 DIAGNOSIS — I680 Cerebral amyloid angiopathy: Secondary | ICD-10-CM

## 2018-01-01 DIAGNOSIS — G459 Transient cerebral ischemic attack, unspecified: Secondary | ICD-10-CM | POA: Diagnosis not present

## 2018-01-01 DIAGNOSIS — R635 Abnormal weight gain: Secondary | ICD-10-CM | POA: Diagnosis not present

## 2018-01-01 DIAGNOSIS — E782 Mixed hyperlipidemia: Secondary | ICD-10-CM

## 2018-01-01 DIAGNOSIS — I10 Essential (primary) hypertension: Secondary | ICD-10-CM

## 2018-01-01 DIAGNOSIS — E210 Primary hyperparathyroidism: Secondary | ICD-10-CM | POA: Diagnosis not present

## 2018-01-01 NOTE — Progress Notes (Signed)
Location:  Occupational psychologist of Service:  ALF (13) Provider:   Cindi Carbon, ANP [place] [phone]  Gayland Curry, DO  Patient Care Team: Gayland Curry, DO as PCP - General (Geriatric Medicine) Irine Seal, MD as Attending Physician (Urology) Jodi Marble, MD as Consulting Physician (Otolaryngology) Martinique, Amy, MD as Consulting Physician (Dermatology) Gerda Diss, DO as Consulting Physician (Family Medicine) Deneise Lever, MD as Consulting Physician (Pulmonary Disease) Arta Silence, MD as Consulting Physician (Gastroenterology) Dohmeier, Asencion Partridge, MD as Consulting Physician (Neurology) Geanie Kenning, MD as Consulting Physician (Psychiatry)  Extended Emergency Contact Information Primary Emergency Contact: [name] Address: [address]          [place], [place] [address] [name] of [place] Phone: [phone] Mobile Phone: [phone] Relation: Spouse  Code Status:  DNR Goals of care: Advanced Directive information Advanced Directives 01/01/2018  Does Patient Have a Medical Advance Directive? -  Type of Paramedic of [place];Living will;Out of facility DNR (pink MOST or yellow form)  Does patient want to make changes to medical advance directive? -  Copy of [place] in Chart? -  Would patient like information on creating a medical advance directive? -  Pre-existing out of facility DNR order (yellow form or pink MOST form) Yellow form placed in chart (order not valid for inpatient use)     Chief Complaint  Patient presents with  . Medical Management of Chronic Issues    HPI:  Pt is a 77 y.o. female seen today for medical management of chronic diseases.  She resides in memory care due to a hx of cerebral amyloid angiopathy. She remains ambulatory and verbal but has difficulty answering questions in a straight forward fashion. She has periods of crying  and anxiety and tends to wander throughout the facility. The staff report the crying episodes have improved over time. She is not able to take an MMSE test.   HTN: BP readings are quite variable from 024 systolic to 097 with automatic cuff  Hyperparathyroidism: Ionized Ca 5.5, PTH: 96.86 Mar 2019  HLD: LDL 102 Mar of 2019  Insomnia: matrix notes do not indicate an issue with sleep pattern  Hx of TIA on aspirin   05/25/16 MRI IMPRESSION:  This MRI of the brain without contrast shows the following: 1.    Multiple cortical and subcortical microhemorrhages consistent with amyloid angiopathy. No definite change when compared to the MRI from 12/28/2014. 2.    T2/FLAIR hyperintense foci, predominantly in the subcortical and deep white matter of both hemispheres consistent with chronic microvascular ischemic change, unchanged when compared to the 12/28/2014 MRI.  Weight gain, has gained 15 lbs in the past 6 months  Past Medical History:  Diagnosis Date  . Arthritis    back and neck  . Carotid artery stenosis   . Cerebral amyloid angiopathy (CODE) 06/20/2016  . Chronic rhinosinusitis   . DDD (degenerative disc disease)   . Depression   . Diverticulosis of colon   . Frequency of urination   . Glaucoma   . Headache disorder 12/13/2014   Right temple  . Hearing loss   . History of kidney stones    MULTIBLE SURGICAL INTERVENTIONS  . History of skin cancer HX TOPICAL FACIAL [facility] 2013  . History of TIA (transient ischemic attack)    NOV 2013-- NO RESIDUAL  . Hypertension   . IBS (irritable bowel syndrome)   . Memory difficulty  05/14/2016  . Memory loss   . Mild asthma    COLD INDUCED  . Right ureteral stone   . Seasonal and perennial allergic rhinitis   . Shingles    EYEBALL 09-17-2013  . Spondylosis    Past Surgical History:  Procedure Laterality Date  . BUNIONECTOMY     BILATERAL  . CALDWELL LUC     sinus drainage procedure  . CYSTO/ BILATERAL URETEROSCOPIC STONE EXTRACTIONS   02-09-2003  . CYSTO/ BILATERAL URETEROSCOPY/  LASER OF STONES LEFT SIDE  06-02-2007  . CYSTOSCOPY/RETROGRADE/URETEROSCOPY/STONE EXTRACTION WITH BASKET  03/31/2012   Procedure: CYSTOSCOPY/RETROGRADE/URETEROSCOPY/STONE EXTRACTION WITH BASKET;  Surgeon: Malka So, MD;  Location: [facility];  Service: Urology;  Laterality: Right;   . CYSTOSCOPY/RETROGRADE/URETEROSCOPY/STONE EXTRACTION WITH BASKET Right 03/29/2013   Procedure: CYSTOSCOPY//URETEROSCOPY/STONE EXTRACTION WITH BASKET;  Surgeon: Malka So, MD;  Location: [facility];  Service: Urology;  Laterality: Right;  . CYSTOSCOPY/RETROGRADE/URETEROSCOPY/STONE EXTRACTION WITH BASKET Right 10/14/2013   Procedure: CYSTOSCOPY/RETROGRADE/URETEROSCOPY/STONE EXTRACTION WITH BASKET;  Surgeon: Irine Seal, MD;  Location: [facility];  Service: Urology;  Laterality: Right;  . ESOPHAGOGASTRODUODENOSCOPY (EGD) WITH PROPOFOL N/A 04/12/2015   Procedure: ESOPHAGOGASTRODUODENOSCOPY (EGD) WITH PROPOFOL;  Surgeon: Arta Silence, MD;  Location: [facility];  Service: Endoscopy;  Laterality: N/A;  . EUS N/A 04/12/2015   Procedure: ESOPHAGEAL ENDOSCOPIC ULTRASOUND (EUS) RADIAL;  Surgeon: Arta Silence, MD;  Location: [facility];  Service: Endoscopy;  Laterality: N/A;  . EXTRACORPOREAL SHOCK WAVE LITHOTRIPSY  about 5188/ Dr Reece Agar   Never completed procedure-could not tolerate pain. Had seizures due to pain  . EYE SURGERY  AGE 38   INJURY  . HERNIA REPAIR  AGE 59  . HOLMIUM LASER APPLICATION Right 12/30/6061   Procedure: HOLMIUM LASER APPLICATION;  Surgeon: Irine Seal, MD;  Location: [facility];  Service: Urology;  Laterality: Right;  . LAPAROSCOPIC CHOLECYSTECTOMY  01-12-2005  . LEFT URETEROSCOPIC STONE EXTRACTION    LAST ONE 04-28-2009   SEVERAL SURGICAL STONE EXTRACTIONS  . LUMBAR LAMINECTOMY  09-30-2002   L4 - 5;  SPONDYLOLISTHISES W/ STENOSIS AND RADICULOPATHY  . PERCUTANEOUS NEPHROSTOLITHOTOMY  1975  . RIGHT  URETEROSCOPIC STONE EXTRACTION  LAST ONE 02-21-2011   MULTIPLE SURGICAL STONE EXTRACTIONS  . TONSILECTOMY, ADENOIDECTOMY, BILATERAL MYRINGOTOMY AND TUBES  CHILD  . VAGINAL HYSTERECTOMY  1991   W/ BILATERAL SALPINGOOPHECTOMY    Allergies  Allergen Reactions  . Donepezil Hcl   . Ace Inhibitors Cough  . Hctz [Hydrochlorothiazide]     Bladder problems  . Triptans     Avoid per neuro    Outpatient Encounter Medications as of 01/01/2018  Medication Sig  . aliskiren (TEKTURNA) 150 MG tablet Take 0.5 tablets (75 mg total) by mouth daily.  Marland Kitchen aspirin EC 81 MG tablet Take 81 mg by mouth daily.  Marland Kitchen atorvastatin (LIPITOR) 10 MG tablet Take 10 mg by mouth daily at 6 PM.  . Cranberry 500 MG TABS Take 1 tablet by mouth daily.  Marland Kitchen docusate sodium (COLACE) 100 MG capsule Take 100 mg by mouth daily as needed for mild constipation or moderate constipation.  . DULoxetine (CYMBALTA) 30 MG capsule Take 60 mg by mouth daily.   Marland Kitchen estradiol (ESTRACE) 1 MG tablet Take 1 mg by mouth every other day.   Marland Kitchen LORazepam (ATIVAN) 1 MG tablet Take 0.5 mg by mouth 3 (three) times daily with meals. May also take 0.5 mg by mouth every 4 hours as needed for agitation or restlessness  . Multiple Vitamins-Minerals (MULTIVITAMINS  THER. W/MINERALS) TABS tablet Take 1 tablet daily by mouth.  . OLANZapine (ZYPREXA) 10 MG tablet Take 10 mg by mouth at bedtime.  . temazepam (RESTORIL) 15 MG capsule Take 15 mg by mouth at bedtime.  . traZODone (DESYREL) 100 MG tablet Take 150 mg by mouth at bedtime.   . [DISCONTINUED] montelukast (SINGULAIR) 10 MG tablet Take 1 tablet (10 mg total) by mouth daily.   No facility-administered encounter medications on file as of 01/01/2018.     Review of Systems  Unable to perform ROS: Dementia    Immunization History  Administered Date(s) Administered  . Hepatitis A 11/15/2003, 06/11/2004, 12/25/2012  . Hepatitis B 11/14/2008, 12/15/2008  . Influenza Split 06/17/2011  . Influenza Whole  06/16/2010  . Influenza, High Dose Seasonal PF 05/27/2016  . Influenza-Unspecified 07/10/2017  . Pneumococcal Conjugate-13 08/09/2014  . Pneumococcal Polysaccharide-23 04/22/2011, 11/15/2011  . Tdap 11/14/2008, 12/25/2012  . Typhoid Inactivated 12/25/2012  . Yellow Fever 12/25/2012  . Zoster 05/07/2012  . Zoster Recombinat (Shingrix) 01/03/2017, 04/04/2017   Pertinent  Health Maintenance Due  Topic Date Due  . DEXA SCAN  02/01/2008  . INFLUENZA VACCINE  04/16/2018  . MAMMOGRAM  07/23/2018  . PNA vac Low Risk Adult  Completed  . COLONOSCOPY  Discontinued   Fall Risk  10/29/2016  Falls in the past year? Yes  Number falls in past yr: 2 or more  Injury with Fall? Yes  Follow up Falls prevention discussed   Functional Status Survey:    Vitals:   01/01/18 1040  Weight: 140 lb (63.5 kg)   Body mass index is 24.8 kg/m. Physical Exam  Constitutional: No distress.  HENT:  Mouth/Throat: No oropharyngeal exudate.  Eyes: Pupils are equal, round, and reactive to light. Conjunctivae and EOM are normal.  Neck: No JVD present. No thyromegaly present.  Cardiovascular: Normal rate and normal heart sounds.  No murmur heard. Pulmonary/Chest: Effort normal and breath sounds normal. No stridor. No respiratory distress.  Abdominal: Soft. Bowel sounds are normal.  Musculoskeletal: Normal range of motion.  Lymphadenopathy:    She has no cervical adenopathy.  Neurological: She is alert. No cranial nerve deficit.  Oriented to self only, intermittently able to f/c  Skin: Skin is warm and dry. She is not diaphoretic.  Psychiatric: She has a normal mood and affect.  Nursing note and vitals reviewed.   Labs reviewed: Recent Labs    03/10/17 1122 03/13/17 11/14/17  NA 139 142 142  K 3.2* 4.2 4.9  BUN 18 19 15   CREATININE 0.9 1.0 0.9   Recent Labs    01/20/17 0700  AST 19  ALT 12  ALKPHOS 54   Recent Labs    03/10/17 1122 03/13/17 11/14/17  WBC 14.1 6.6 6.4  NEUTROABS  --  5   --   HGB 12.8 12.8 14.0  HCT 39 39 41  PLT 156 240 191   Lab Results  Component Value Date   TSH 0.832 09/28/2016   Lab Results  Component Value Date   HGBA1C 5.4 11/14/2017   Lab Results  Component Value Date   CHOL 183 11/14/2017   HDL 64 11/14/2017   LDLCALC 102 11/14/2017   TRIG 86 11/14/2017   CHOLHDL 2.9 07/15/2016    Significant Diagnostic Results in last 30 days:  No results found.  Assessment/Plan  1. Weight gain Staff reports good appetite Check TSH due to 15 lb gain  2. Essential hypertension Manual BP qd x 3 days and report due  to varying numbers  3. Cerebral amyloid angiopathy (CODE) Remains ambulatory and has not needed a walker Needs skilled assistance for all ADLs Behaviors have improved over the past few months but tearful episodes still occur occasionally with anxiety and therefore would not change the current regimen  4. TIA (transient ischemic attack) Continue baby asa  5. Hyperparathyroidism, primary ([place]) Ionized Ca remains WNL, continue to monitor.  6. Insomnia due to mental disorder Continue restoril and trazodone nightly for sleep  7. Mixed hyperlipidemia Continue lipitor 10 mg qd    Family/ staff Communication: discussed with nurse  Labs/tests ordered:  TSH

## 2018-01-05 DIAGNOSIS — E039 Hypothyroidism, unspecified: Secondary | ICD-10-CM | POA: Diagnosis not present

## 2018-01-05 DIAGNOSIS — R635 Abnormal weight gain: Secondary | ICD-10-CM | POA: Diagnosis not present

## 2018-01-05 LAB — TSH: TSH: 1.62 (ref 0.41–5.90)

## 2018-01-21 ENCOUNTER — Non-Acute Institutional Stay (SKILLED_NURSING_FACILITY): Payer: Medicare Other

## 2018-01-21 DIAGNOSIS — Z0000 Encounter for general adult medical examination without abnormal findings: Secondary | ICD-10-CM

## 2018-01-21 NOTE — Patient Instructions (Signed)
Ashley [name] , Thank you for taking time to come for your Medicare Wellness Visit. I appreciate your ongoing commitment to your health goals. Please review the following plan we discussed and let me know if I can assist you in the future.   Screening recommendations/referrals: Colonoscopy up to date Mammogram up to date, due 07/23/2018 Bone Density due, ordered Recommended yearly ophthalmology/optometry visit for glaucoma screening and checkup Recommended yearly dental visit for hygiene and checkup  Vaccinations: Influenza vaccine up to date, due 2019 fall season Pneumococcal vaccine up to date, completed Tdap vaccine up to date, due 12/26/2022 Shingles vaccine completed  Advanced directives: in chart  Conditions/risks identified: none  Next appointment: Dr. Mariea Clonts makes rounds   Preventive Care 46 Years and Older, Female Preventive care refers to lifestyle choices and visits with your health care provider that can promote health and wellness. What does preventive care include?  A yearly physical exam. This is also called an annual well check.  Dental exams once or twice a year.  Routine eye exams. Ask your health care provider how often you should have your eyes checked.  Personal lifestyle choices, including:  Daily care of your teeth and gums.  Regular physical activity.  Eating a healthy diet.  Avoiding tobacco and drug use.  Limiting alcohol use.  Practicing safe sex.  Taking low-dose aspirin every day.  Taking vitamin and mineral supplements as recommended by your health care provider. What happens during an annual well check? The services and screenings done by your health care provider during your annual well check will depend on your age, overall health, lifestyle risk factors, and family history of disease. Counseling  Your health care provider may ask you questions about your:  Alcohol use.  Tobacco use.  Drug use.  Emotional well-being.  Home  and relationship well-being.  Sexual activity.  Eating habits.  History of falls.  Memory and ability to understand (cognition).  Work and work Statistician.  Reproductive health. Screening  You may have the following tests or measurements:  Height, weight, and BMI.  Blood pressure.  Lipid and cholesterol levels. These may be checked every 5 years, or more frequently if you are over 33 years old.  Skin check.  Lung cancer screening. You may have this screening every year starting at age 55 if you have a 30-pack-year history of smoking and currently smoke or have quit within the past 15 years.  Fecal occult blood test (FOBT) of the stool. You may have this test every year starting at age 44.  Flexible sigmoidoscopy or colonoscopy. You may have a sigmoidoscopy every 5 years or a colonoscopy every 10 years starting at age 15.  Hepatitis C blood test.  Hepatitis B blood test.  Sexually transmitted disease (STD) testing.  Diabetes screening. This is done by checking your blood sugar (glucose) after you have not eaten for a while (fasting). You may have this done every 1-3 years.  Bone density scan. This is done to screen for osteoporosis. You may have this done starting at age 31.  Mammogram. This may be done every 1-2 years. Talk to your health care provider about how often you should have regular mammograms. Talk with your health care provider about your test results, treatment options, and if necessary, the need for more tests. Vaccines  Your health care provider may recommend certain vaccines, such as:  Influenza vaccine. This is recommended every year.  Tetanus, diphtheria, and acellular pertussis (Tdap, Td) vaccine. You may need a  Td booster every 10 years.  Zoster vaccine. You may need this after age 29.  Pneumococcal 13-valent conjugate (PCV13) vaccine. One dose is recommended after age 85.  Pneumococcal polysaccharide (PPSV23) vaccine. One dose is recommended  after age 10. Talk to your health care provider about which screenings and vaccines you need and how often you need them. This information is not intended to replace advice given to you by your health care provider. Make sure you discuss any questions you have with your health care provider. Document Released: 09/29/2015 Document Revised: 05/22/2016 Document Reviewed: 07/04/2015 Elsevier Interactive Patient Education  2017 [place] Prevention in the Home Falls can cause injuries. They can happen to people of all ages. There are many things you can do to make your home safe and to help prevent falls. What can I do on the outside of my home?  Regularly fix the edges of walkways and driveways and fix any cracks.  Remove anything that might make you trip as you walk through a door, such as a raised step or threshold.  Trim any bushes or trees on the path to your home.  Use bright outdoor lighting.  Clear any walking paths of anything that might make someone trip, such as rocks or tools.  Regularly check to see if handrails are loose or broken. Make sure that both sides of any steps have handrails.  Any raised decks and porches should have guardrails on the edges.  Have any leaves, snow, or ice cleared regularly.  Use sand or salt on walking paths during winter.  Clean up any spills in your garage right away. This includes oil or grease spills. What can I do in the bathroom?  Use night lights.  Install grab bars by the toilet and in the tub and shower. Do not use towel bars as grab bars.  Use non-skid mats or decals in the tub or shower.  If you need to sit down in the shower, use a plastic, non-slip stool.  Keep the floor dry. Clean up any water that spills on the floor as soon as it happens.  Remove soap buildup in the tub or shower regularly.  Attach bath mats securely with double-sided non-slip rug tape.  Do not have throw rugs and other things on the floor  that can make you trip. What can I do in the bedroom?  Use night lights.  Make sure that you have a light by your bed that is easy to reach.  Do not use any sheets or blankets that are too big for your bed. They should not hang down onto the floor.  Have a firm chair that has side arms. You can use this for support while you get dressed.  Do not have throw rugs and other things on the floor that can make you trip. What can I do in the kitchen?  Clean up any spills right away.  Avoid walking on wet floors.  Keep items that you use a lot in easy-to-reach places.  If you need to reach something above you, use a strong step stool that has a grab bar.  Keep electrical cords out of the way.  Do not use floor polish or wax that makes floors slippery. If you must use wax, use non-skid floor wax.  Do not have throw rugs and other things on the floor that can make you trip. What can I do with my stairs?  Do not leave any items on the stairs.  Make sure that there are handrails on both sides of the stairs and use them. Fix handrails that are broken or loose. Make sure that handrails are as long as the stairways.  Check any carpeting to make sure that it is firmly attached to the stairs. Fix any carpet that is loose or worn.  Avoid having throw rugs at the top or bottom of the stairs. If you do have throw rugs, attach them to the floor with carpet tape.  Make sure that you have a light switch at the top of the stairs and the bottom of the stairs. If you do not have them, ask someone to add them for you. What else can I do to help prevent falls?  Wear shoes that:  Do not have high heels.  Have rubber bottoms.  Are comfortable and fit you well.  Are closed at the toe. Do not wear sandals.  If you use a stepladder:  Make sure that it is fully opened. Do not climb a closed stepladder.  Make sure that both sides of the stepladder are locked into place.  Ask someone to hold it  for you, if possible.  Clearly mark and make sure that you can see:  Any grab bars or handrails.  First and last steps.  Where the edge of each step is.  Use tools that help you move around (mobility aids) if they are needed. These include:  Canes.  Walkers.  Scooters.  Crutches.  Turn on the lights when you go into a dark area. Replace any light bulbs as soon as they burn out.  Set up your furniture so you have a clear path. Avoid moving your furniture around.  If any of your floors are uneven, fix them.  If there are any pets around you, be aware of where they are.  Review your medicines with your doctor. Some medicines can make you feel dizzy. This can increase your chance of falling. Ask your doctor what other things that you can do to help prevent falls. This information is not intended to replace advice given to you by your health care provider. Make sure you discuss any questions you have with your health care provider. Document Released: 06/29/2009 Document Revised: 02/08/2016 Document Reviewed: 10/07/2014 Elsevier Interactive Patient Education  2017 [employer].

## 2018-01-21 NOTE — Progress Notes (Signed)
Subjective:   Ashley [name] is a 77 y.o. female who presents for Medicare Annual (Subsequent) preventive examination at [facility] care long term snf  Last AWV-01/03/2017    Objective:     Vitals: BP 126/64 (BP Location: Left Arm, Patient Position: Sitting)   Pulse (!) 53   Temp 98 F (36.7 C) (Oral)   Ht 5\' 2"  (1.575 m)   Wt 140 lb (63.5 kg)   SpO2 95%   BMI 25.61 kg/m   Body mass index is 25.61 kg/m.  Advanced Directives 01/21/2018 01/01/2018 01/01/2018 07/29/2017 04/17/2017 03/11/2017 01/28/2017  Does Patient Have a Medical Advance Directive? Yes - Yes Yes Yes Yes Yes  Type of Paramedic of [place];Living will;Out of facility DNR (pink MOST or yellow form) [place];Living will;Out of facility DNR (pink MOST or yellow form) [place];Living will;Out of facility DNR (pink MOST or yellow form) Out of facility DNR (pink MOST or yellow form);Living will;Healthcare Power of Attorney Out of facility DNR (pink MOST or yellow form);Living will;Healthcare Power of Attorney Out of facility DNR (pink MOST or yellow form);Living will;Healthcare Power of Attorney Out of facility DNR (pink MOST or yellow form);Living will;Healthcare Power of Attorney  Does patient want to make changes to medical advance directive? - - - No - Patient declined - - -  Copy of [place] in Chart? Yes - Yes Yes Yes Yes Yes  Would patient like information on creating a medical advance directive? - - - No - Patient declined - - -  Pre-existing out of facility DNR order (yellow form or pink MOST form) Yellow form placed in chart (order not valid for inpatient use) Yellow form placed in chart (order not valid for inpatient use) Pink MOST form placed in chart (order not valid for inpatient use);Yellow form placed in chart (order not valid for inpatient use) Yellow form placed in chart (order not valid for inpatient use) Yellow form placed in  chart (order not valid for inpatient use) Yellow form placed in chart (order not valid for inpatient use) -    Tobacco Social History   Tobacco Use  Smoking Status Former Smoker  . Years: 23.00  . Types: Cigarettes  . Last attempt to quit: 09/16/1985  . Years since quitting: 32.3  Smokeless Tobacco Never Used     Counseling given: Not Answered   Clinical Intake:  Pre-visit preparation completed: No  Pain : No/denies pain     Nutritional Risks: None Diabetes: No  How often do you need to have someone help you when you read instructions, pamphlets, or other written materials from your doctor or pharmacy?: 4 - Often  Interpreter Needed?: No  Information entered by :: Tyson Dense, RN  Past Medical History:  Diagnosis Date  . Arthritis    back and neck  . Carotid artery stenosis   . Cerebral amyloid angiopathy (CODE) 06/20/2016  . Chronic rhinosinusitis   . DDD (degenerative disc disease)   . Depression   . Diverticulosis of colon   . Frequency of urination   . Glaucoma   . Headache disorder 12/13/2014   Right temple  . Hearing loss   . History of kidney stones    MULTIBLE SURGICAL INTERVENTIONS  . History of skin cancer HX TOPICAL FACIAL [place] 2013  . History of TIA (transient ischemic attack)    NOV 2013-- NO RESIDUAL  . Hypertension   . IBS (irritable bowel syndrome)   .  Memory difficulty 05/14/2016  . Memory loss   . Mild asthma    COLD INDUCED  . Right ureteral stone   . Seasonal and perennial allergic rhinitis   . Shingles    EYEBALL 09-17-2013  . Spondylosis    Past Surgical History:  Procedure Laterality Date  . BUNIONECTOMY     BILATERAL  . CALDWELL LUC     sinus drainage procedure  . CYSTO/ BILATERAL URETEROSCOPIC STONE EXTRACTIONS  02-09-2003  . CYSTO/ BILATERAL URETEROSCOPY/  LASER OF STONES LEFT SIDE  06-02-2007  . CYSTOSCOPY/RETROGRADE/URETEROSCOPY/STONE EXTRACTION WITH BASKET  03/31/2012   Procedure:  CYSTOSCOPY/RETROGRADE/URETEROSCOPY/STONE EXTRACTION WITH BASKET;  Surgeon: Malka So, MD;  [facility];  Service: Urology;  Laterality: Right;   . CYSTOSCOPY/RETROGRADE/URETEROSCOPY/STONE EXTRACTION WITH BASKET Right 03/29/2013   Procedure: CYSTOSCOPY//URETEROSCOPY/STONE EXTRACTION WITH BASKET;  Surgeon: Malka So, MD;  Location: [facility];  Service: Urology;  Laterality: Right;  . CYSTOSCOPY/RETROGRADE/URETEROSCOPY/STONE EXTRACTION WITH BASKET Right 10/14/2013   Procedure: CYSTOSCOPY/RETROGRADE/URETEROSCOPY/STONE EXTRACTION WITH BASKET;  Surgeon: Irine Seal, MD;  Location[facility];  Service: Urology;  Laterality: Right;  . ESOPHAGOGASTRODUODENOSCOPY (EGD) WITH PROPOFOL N/A 04/12/2015   Procedure: ESOPHAGOGASTRODUODENOSCOPY (EGD) WITH PROPOFOL;  Surgeon: Arta Silence, MD;  Location: [facility];  Service: Endoscopy;  Laterality: N/A;  . EUS N/A 04/12/2015   Procedure: ESOPHAGEAL ENDOSCOPIC ULTRASOUND (EUS) RADIAL;  Surgeon: Arta Silence, MD;  Location: [facility];  Service: Endoscopy;  Laterality: N/A;  . EXTRACORPOREAL SHOCK WAVE LITHOTRIPSY  about [id number]/ Dr Reece Agar   Never completed procedure-could not tolerate pain. Had seizures due to pain  . EYE SURGERY  AGE 71   INJURY  . HERNIA REPAIR  AGE 48  . HOLMIUM LASER APPLICATION Right 03/18/5008   Procedure: HOLMIUM LASER APPLICATION;  Surgeon: Irine Seal, MD;  Location: [facility];  Service: Urology;  Laterality: Right;  . LAPAROSCOPIC CHOLECYSTECTOMY  01-12-2005  . LEFT URETEROSCOPIC STONE EXTRACTION    LAST ONE 04-28-2009   SEVERAL SURGICAL STONE EXTRACTIONS  . LUMBAR LAMINECTOMY  09-30-2002   L4 - 5;  SPONDYLOLISTHISES W/ STENOSIS AND RADICULOPATHY  . PERCUTANEOUS NEPHROSTOLITHOTOMY  1975  . RIGHT URETEROSCOPIC STONE EXTRACTION  LAST ONE 02-21-2011   MULTIPLE SURGICAL STONE EXTRACTIONS  . TONSILECTOMY, ADENOIDECTOMY, BILATERAL MYRINGOTOMY AND TUBES  CHILD  . VAGINAL  HYSTERECTOMY  1991   W/ BILATERAL SALPINGOOPHECTOMY   Family History  Problem Relation Age of Onset  . COPD Mother   . Breast cancer Mother   . Colon cancer Neg Hx   . Migraines Neg Hx    Social History   Socioeconomic History  . Marital status: Married    Spouse name: Not on file  . Number of children: 2  . Years of education: masters  . Highest education level: Not on file  Occupational History  . 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 Financial resource strain: Not on file  . Food insecurity:    Worry: Not on file    Inability: Not on file  . Transportation needs:    Medical: Not on file    Non-medical: Not on file  Tobacco Use  . Smoking status: Former Smoker    Years: 23.00    Types: Cigarettes    Last attempt to quit: 09/16/1985    Years since quitting: 32.3  . Smokeless tobacco: Never Used  Substance and Sexual Activity  . Alcohol use: Never    Frequency: Never    Comment:  patient has dementia  . Drug use: No  . Sexual activity: Never  Lifestyle  . Physical activity:    Days per week: Not on file    Minutes per session: Not on file  . Stress: Not on file  Relationships  . Social connections:    Talks on phone: Not on file    Gets together: Not on file    Attends religious service: Not on file    Active member of club or organization: Not on file    Attends meetings of clubs or organizations: Not on file    Relationship status: Not on file  Other Topics Concern  . Not on file  Social History Narrative   Daily caffeine-1 cup    Patient is right handed.   Lives at home with husband.      Tobacco use, amount per day now: NONE   Past tobacco use, amount per day: QUIT ABOUT 40+ YEARS AGO   How many years did you use tobacco: ABOUT 12 YEARS   Alcohol use (drinks per week): NONE NOW   Diet: AVOID FOODS THAT TRIGGER DIVERTICULITIS   Do you drink/eat things with caffeine: SODA/COFFEE   Marital status:   MARRIED                                What year were you married? 1969   Do you live in a house, apartment, assisted living, condo, trailer, etc.? MEMORY CARE UNIT AS OF 10/22/16   Is it one or more stories? ONE   How many persons live in your home?    Do you have pets in your home?( please list)   Current or past profession: [employer]   Do you exercise?    MINIMAL                              Type and how often?   Do you have a living will? YES   Do you have a DNR form?    YES                               If not, do you want to discuss one?   Do you have signed POA/HPOA forms?  YES                      If so, please bring to you appointment    Outpatient Encounter Medications as of 01/21/2018  Medication Sig  . aliskiren (TEKTURNA) 150 MG tablet Take 0.5 tablets (75 mg total) by mouth daily.  Marland Kitchen aspirin EC 81 MG tablet Take 81 mg by mouth daily.  Marland Kitchen atorvastatin (LIPITOR) 10 MG tablet Take 10 mg by mouth daily at 6 PM.  . Cranberry 500 MG TABS Take 1 tablet by mouth daily.  Marland Kitchen docusate sodium (COLACE) 100 MG capsule Take 100 mg by mouth daily as needed for mild constipation or moderate constipation.  . DULoxetine (CYMBALTA) 30 MG capsule Take 60 mg by mouth daily.   Marland Kitchen estradiol (ESTRACE) 1 MG tablet Take 1 mg by mouth every other day.   Marland Kitchen LORazepam (ATIVAN) 1 MG tablet Take 0.5 mg by mouth 3 (three) times daily with meals. May also take 0.5 mg by mouth every 4 hours as needed for agitation or  restlessness  . Multiple Vitamins-Minerals (MULTIVITAMINS THER. W/MINERALS) TABS tablet Take 1 tablet daily by mouth.  . OLANZapine (ZYPREXA) 10 MG tablet Take 10 mg by mouth at bedtime.  . temazepam (RESTORIL) 15 MG capsule Take 15 mg by mouth at bedtime.  . traZODone (DESYREL) 100 MG tablet Take 150 mg by mouth at bedtime.   . [DISCONTINUED] montelukast (SINGULAIR) 10 MG tablet Take 1 tablet (10 mg total) by mouth daily.   No facility-administered encounter medications on file as of 01/21/2018.      Activities of Daily Living In your present state of health, do you have any difficulty performing the following activities: 01/21/2018  Hearing? N  Vision? N  Difficulty concentrating or making decisions? Y  Walking or climbing stairs? N  Dressing or bathing? Y  Doing errands, shopping? Y  Preparing Food and eating ? Y  Using the Toilet? N  In the past six months, have you accidently leaked urine? Y  Do you have problems with loss of bowel control? Y  Managing your Medications? Y  Managing your Finances? Y  Housekeeping or managing your Housekeeping? Y  Some recent data might be hidden    Patient Care Team: Gayland Curry, DO as PCP - General (Geriatric Medicine) Irine Seal, MD as Attending Physician (Urology) Jodi Marble, MD as Consulting Physician (Otolaryngology) Martinique, Amy, MD as Consulting Physician (Dermatology) Gerda Diss, DO as Consulting Physician (Family Medicine) Deneise Lever, MD as Consulting Physician (Pulmonary Disease) Arta Silence, MD as Consulting Physician (Gastroenterology) Dohmeier, Asencion Partridge, MD as Consulting Physician (Neurology) Geanie Kenning, MD as Consulting Physician (Psychiatry)    Assessment:   This is a routine wellness examination for [place].  Exercise Activities and Dietary recommendations Current Exercise Habits: The patient does not participate in regular exercise at present, Exercise limited by: neurologic condition(s)  Goals    None      Fall Risk Fall Risk  01/21/2018 10/29/2016  Falls in the past year? Yes Yes  Number falls in past yr: 2 or more 2 or more  Injury with Fall? No Yes  Follow up - Falls prevention discussed   Is the patient's home free of loose throw rugs in walkways, pet beds, electrical cords, etc?   yes      Grab bars in the bathroom? yes      Handrails on the stairs?   yes      Adequate lighting?   yes  Timed Get Up and Go performed: 25 seconds, fall risk  Depression Screen PHQ 2/9 Scores  01/21/2018  Exception Documentation Medical reason     Cognitive Function: MMSE - Mini Mental State Exam 01/21/2018 10/29/2016 05/14/2016  Not completed: Unable to complete - -  Orientation to time - 2 4  Orientation to Place - 0 4  Registration - 3 3  Attention/ Calculation - 3 5  Recall - 0 0  Language- name 2 objects - 2 2  Language- repeat - 1 1  Language- follow 3 step command - 3 3  Language- read & follow direction - 1 1  Write a sentence - 0 1  Copy design - 0 1  Total score - 15 25        Immunization History  Administered Date(s) Administered  . Hepatitis A 11/15/2003, 06/11/2004, 12/25/2012  . Hepatitis B 11/14/2008, 12/15/2008  . Influenza Split 06/17/2011  . Influenza Whole 06/16/2010  . Influenza, High Dose Seasonal PF 05/27/2016  . Influenza-Unspecified 07/10/2017  . Pneumococcal Conjugate-13 08/09/2014  .  Pneumococcal Polysaccharide-23 04/22/2011, 11/15/2011  . Tdap 11/14/2008, 12/25/2012  . Typhoid Inactivated 12/25/2012  . Yellow Fever 12/25/2012  . Zoster 05/07/2012  . Zoster Recombinat (Shingrix) 01/03/2017, 04/04/2017    Qualifies for Shingles Vaccine? Up to date  Screening Tests Health Maintenance  Topic Date Due  . DEXA SCAN  02/01/2008  . INFLUENZA VACCINE  04/16/2018  . MAMMOGRAM  07/23/2018  . TETANUS/TDAP  12/26/2022  . PNA vac Low Risk Adult  Completed  . COLONOSCOPY  Discontinued    Cancer Screenings: Lung: Low Dose CT Chest recommended if Age 1-80 years, 30 pack-year currently smoking OR have quit w/in 15years. Patient does not qualify. Breast:  Up to date on Mammogram? Yes   Up to date of Bone Density/Dexa? No, ordered Colorectal: up to date  Additional Screenings:  Hepatitis C Screening: declined     Plan:    I have personally reviewed and addressed the Medicare Annual Wellness questionnaire and have noted the following in the patient's chart:  A. Medical and social history B. Use of alcohol, tobacco or illicit drugs   C. Current medications and supplements D. Functional ability and status E.  Nutritional status F.  Physical activity G. Advance directives H. List of other physicians I.  Hospitalizations, surgeries, and ER visits in previous 12 months J.  [place] to include hearing, vision, cognitive, depression L. Referrals and appointments - none  In addition, I have reviewed and discussed with patient certain preventive protocols, quality metrics, and best practice recommendations. A written personalized care plan for preventive services as well as general preventive health recommendations were provided to patient.  See attached scanned questionnaire for additional information.   Signed,   Tyson Dense, RN Nurse Health Advisor  Patient Concerns: None

## 2018-02-05 ENCOUNTER — Encounter: Payer: Self-pay | Admitting: Adult Health

## 2018-02-05 ENCOUNTER — Non-Acute Institutional Stay: Payer: Medicare Other | Admitting: Adult Health

## 2018-02-05 DIAGNOSIS — F03918 Unspecified dementia, unspecified severity, with other behavioral disturbance: Secondary | ICD-10-CM

## 2018-02-05 DIAGNOSIS — F0391 Unspecified dementia with behavioral disturbance: Secondary | ICD-10-CM | POA: Diagnosis not present

## 2018-02-05 DIAGNOSIS — F5101 Primary insomnia: Secondary | ICD-10-CM

## 2018-02-05 NOTE — Progress Notes (Signed)
Location:  Occupational psychologist of Service:  ALF (13) Provider:   Cindi Carbon, ANP [place] [phone]   Gayland Curry, DO  Patient Care Team: Gayland Curry, DO as PCP - General (Geriatric Medicine) Irine Seal, MD as Attending Physician (Urology) Jodi Marble, MD as Consulting Physician (Otolaryngology) Martinique, Amy, MD as Consulting Physician (Dermatology) Gerda Diss, DO as Consulting Physician (Family Medicine) Deneise Lever, MD as Consulting Physician (Pulmonary Disease) Arta Silence, MD as Consulting Physician (Gastroenterology) Dohmeier, Asencion Partridge, MD as Consulting Physician (Neurology) Geanie Kenning, MD as Consulting Physician (Psychiatry)  Extended Emergency Contact Information Primary Emergency Contact: [name] Address: [address]          [place], [place] [address] [name] of [place] Phone: [phone] Mobile Phone: [phone] Relation: Spouse  Code Status: DNR Goals of care: Advanced Directive information Advanced Directives 01/21/2018  Does Patient Have a Medical Advance Directive? Yes  Type of Paramedic of [place];Living will;Out of facility DNR (pink MOST or yellow form)  Does patient want to make changes to medical advance directive? -  Copy of [place] in Chart? Yes  Would patient like information on creating a medical advance directive? -  Pre-existing out of facility DNR order (yellow form or pink MOST form) Yellow form placed in chart (order not valid for inpatient use)     Chief Complaint  Patient presents with  . Acute Visit    crying    HPI:  Pt is a 77 y.o. female seen today for an acute visit for crying episodes. The staff reported to me that she was having increased crying episodes. She has a hx of cerebral amyloid angiopathy and associated dementia. She spends much of the day in the memory care unit wandering.  Her gait has  slowed recently and she has had two falls without injury. The nurse on duty does not feel the crying episodes are worse, but another nurse and CNA do feel that it is worse. Her husband has not noticed a difference but only comes in the morning. She is easily redirected when she does cry. The crying episodes can sometimes be related to an emotion per her husband. She some times cries while asking "What is wrong?".  He reports occasional delusions and hallucinations.    Past Medical History:  Diagnosis Date  . Arthritis    back and neck  . Carotid artery stenosis   . Cerebral amyloid angiopathy (CODE) 06/20/2016  . Chronic rhinosinusitis   . DDD (degenerative disc disease)   . Depression   . Diverticulosis of colon   . Frequency of urination   . Glaucoma   . Headache disorder 12/13/2014   Right temple  . Hearing loss   . History of kidney stones    MULTIBLE SURGICAL INTERVENTIONS  . History of skin cancer HX TOPICAL FACIAL [place] 2013  . History of TIA (transient ischemic attack)    NOV 2013-- NO RESIDUAL  . Hypertension   . IBS (irritable bowel syndrome)   . Memory difficulty 05/14/2016  . Memory loss   . Mild asthma    COLD INDUCED  . Right ureteral stone   . Seasonal and perennial allergic rhinitis   . Shingles    EYEBALL 09-17-2013  . Spondylosis    Past Surgical History:  Procedure Laterality Date  . BUNIONECTOMY     BILATERAL  . CALDWELL LUC     sinus drainage  procedure  . CYSTO/ BILATERAL URETEROSCOPIC STONE EXTRACTIONS  02-09-2003  . CYSTO/ BILATERAL URETEROSCOPY/  LASER OF STONES LEFT SIDE  06-02-2007  . CYSTOSCOPY/RETROGRADE/URETEROSCOPY/STONE EXTRACTION WITH BASKET  03/31/2012   Procedure: CYSTOSCOPY/RETROGRADE/URETEROSCOPY/STONE EXTRACTION WITH BASKET;  Surgeon: Malka So, MD;  Location: [facility];  Service: Urology;  Laterality: Right;   . CYSTOSCOPY/RETROGRADE/URETEROSCOPY/STONE EXTRACTION WITH BASKET Right 03/29/2013   Procedure:  CYSTOSCOPY//URETEROSCOPY/STONE EXTRACTION WITH BASKET;  Surgeon: Malka So, MD;  Location: [facility];  Service: Urology;  Laterality: Right;  . CYSTOSCOPY/RETROGRADE/URETEROSCOPY/STONE EXTRACTION WITH BASKET Right 10/14/2013   Procedure: CYSTOSCOPY/RETROGRADE/URETEROSCOPY/STONE EXTRACTION WITH BASKET;  Surgeon: Irine Seal, MD;  Location: [facility];  Service: Urology;  Laterality: Right;  . ESOPHAGOGASTRODUODENOSCOPY (EGD) WITH PROPOFOL N/A 04/12/2015   Procedure: ESOPHAGOGASTRODUODENOSCOPY (EGD) WITH PROPOFOL;  Surgeon: Arta Silence, MD;  Location: [facility];  Service: Endoscopy;  Laterality: N/A;  . EUS N/A 04/12/2015   Procedure: ESOPHAGEAL ENDOSCOPIC ULTRASOUND (EUS) RADIAL;  Surgeon: Arta Silence, MD;  Location: [facility];  Service: Endoscopy;  Laterality: N/A;  . EXTRACORPOREAL SHOCK WAVE LITHOTRIPSY  about 1308/ Dr Reece Agar   Never completed procedure-could not tolerate pain. Had seizures due to pain  . EYE SURGERY  AGE 14   INJURY  . HERNIA REPAIR  AGE 21  . HOLMIUM LASER APPLICATION Right 6/57/8469   Procedure: HOLMIUM LASER APPLICATION;  Surgeon: Irine Seal, MD;  Location: [facility];  Service: Urology;  Laterality: Right;  . LAPAROSCOPIC CHOLECYSTECTOMY  01-12-2005  . LEFT URETEROSCOPIC STONE EXTRACTION    LAST ONE 04-28-2009   SEVERAL SURGICAL STONE EXTRACTIONS  . LUMBAR LAMINECTOMY  09-30-2002   L4 - 5;  SPONDYLOLISTHISES W/ STENOSIS AND RADICULOPATHY  . PERCUTANEOUS NEPHROSTOLITHOTOMY  1975  . RIGHT URETEROSCOPIC STONE EXTRACTION  LAST ONE 02-21-2011   MULTIPLE SURGICAL STONE EXTRACTIONS  . TONSILECTOMY, ADENOIDECTOMY, BILATERAL MYRINGOTOMY AND TUBES  CHILD  . VAGINAL HYSTERECTOMY  1991   W/ BILATERAL SALPINGOOPHECTOMY    Allergies  Allergen Reactions  . Donepezil Hcl   . Ace Inhibitors Cough  . Hctz [Hydrochlorothiazide]     Bladder problems  . Triptans     Avoid per neuro    Outpatient Encounter Medications as of 02/05/2018    Medication Sig  . aliskiren (TEKTURNA) 150 MG tablet Take 0.5 tablets (75 mg total) by mouth daily.  Marland Kitchen aspirin EC 81 MG tablet Take 81 mg by mouth daily.  Marland Kitchen atorvastatin (LIPITOR) 10 MG tablet Take 10 mg by mouth daily at 6 PM.  . Cranberry 500 MG TABS Take 1 tablet by mouth daily.  Marland Kitchen docusate sodium (COLACE) 100 MG capsule Take 100 mg by mouth daily as needed for mild constipation or moderate constipation.  . DULoxetine (CYMBALTA) 30 MG capsule Take 60 mg by mouth daily.   Marland Kitchen estradiol (ESTRACE) 1 MG tablet Take 1 mg by mouth every other day.   Marland Kitchen LORazepam (ATIVAN) 1 MG tablet Take 0.5 mg by mouth 3 (three) times daily with meals. May also take 0.5 mg by mouth every 4 hours as needed for agitation or restlessness  . Multiple Vitamins-Minerals (MULTIVITAMINS THER. W/MINERALS) TABS tablet Take 1 tablet daily by mouth.  . OLANZapine (ZYPREXA) 10 MG tablet Take 10 mg by mouth at bedtime.  . temazepam (RESTORIL) 15 MG capsule Take 15 mg by mouth at bedtime.  . traZODone (DESYREL) 100 MG tablet Take 150 mg by mouth at bedtime.   . [DISCONTINUED] montelukast (SINGULAIR) 10 MG tablet Take 1 tablet (10 mg total)  by mouth daily.   No facility-administered encounter medications on file as of 02/05/2018.     Review of Systems  Unable to perform ROS: Dementia    Immunization History  Administered Date(s) Administered  . Hepatitis A 11/15/2003, 06/11/2004, 12/25/2012  . Hepatitis B 11/14/2008, 12/15/2008  . Influenza Split 06/17/2011  . Influenza Whole 06/16/2010  . Influenza, High Dose Seasonal PF 05/27/2016  . Influenza-Unspecified 07/10/2017  . Pneumococcal Conjugate-13 08/09/2014  . Pneumococcal Polysaccharide-23 04/22/2011, 11/15/2011  . Tdap 11/14/2008, 12/25/2012  . Typhoid Inactivated 12/25/2012  . Yellow Fever 12/25/2012  . Zoster 05/07/2012  . Zoster Recombinat (Shingrix) 01/03/2017, 04/04/2017   Pertinent  Health Maintenance Due  Topic Date Due  . DEXA SCAN  02/01/2008  .  INFLUENZA VACCINE  04/16/2018  . PNA vac Low Risk Adult  Completed  . COLONOSCOPY  Discontinued   Fall Risk  01/21/2018 10/29/2016  Falls in the past year? Yes Yes  Number falls in past yr: 2 or more 2 or more  Injury with Fall? No Yes  Follow up - Falls prevention discussed   Functional Status Survey:    There were no vitals filed for this visit. There is no height or weight on file to calculate BMI. Physical Exam  Constitutional: No distress.  Cardiovascular: Normal rate.  No murmur heard. Pulmonary/Chest: Effort normal and breath sounds normal.  Abdominal: Soft. Bowel sounds are normal.  Neurological: She is alert.  Not oriented, intermittently able to f/c  Skin: Skin is warm. She is not diaphoretic.  Psychiatric:  Flat affect    Labs reviewed: Recent Labs    03/10/17 1122 03/13/17 11/14/17  NA 139 142 142  K 3.2* 4.2 4.9  BUN 18 19 15   CREATININE 0.9 1.0 0.9   No results for input(s): AST, ALT, ALKPHOS, BILITOT, PROT, ALBUMIN in the last 8760 hours. Recent Labs    03/10/17 1122 03/13/17 11/14/17  WBC 14.1 6.6 6.4  NEUTROABS  --  5  --   HGB 12.8 12.8 14.0  HCT 39 39 41  PLT 156 240 191   Lab Results  Component Value Date   TSH 0.832 09/28/2016   Lab Results  Component Value Date   HGBA1C 5.4 11/14/2017   Lab Results  Component Value Date   CHOL 183 11/14/2017   HDL 64 11/14/2017   LDLCALC 102 11/14/2017   TRIG 86 11/14/2017   CHOLHDL 2.9 07/15/2016    Significant Diagnostic Results in last 30 days:  No results found.  Assessment/Plan  1. Chronic dementia, with behavioral disturbance There is an unclear consensus from the staff regarding her crying episodes. Some feel that she is crying more, while others feel that it has not changed. Her husband has not noticed a change, but he only visits in the morning time. I have noticed periods of crying when I visit her but she is easily distracted and the crying resolves. Her husband feels that her crying  episodes are related to emotions about living in memory care and having dementia, not unprovoked which would not suggest PBA.  She is showing signs of slowing gait and gaining weight. She is less verbal over time and has had two falls recently.  Her symptoms are difficult to manage and not easily reversible. I spent a good while talking with her husband we decided to hold off on medication changes until after her careplan meeting next week. He is aware the these medications present a fall risk, as well as other side effects  and that there are no easy fixes for her crying episodes. Luckily she has had no signs of aggression and is not a danger to her self or others. If there is a better consensus regarding her behaviors at the [place] and they are truly worse, we could consider increasing the ativan or Zyprexa.   2. Primary insomnia Staff report that she sleeps well. Continue trazodone and restoril. Her husband feels that it took a while to get her to the point where she was sleeping this well and does not want these medications changed.    Family/ staff Communication: discussed with staff and her husband  Labs/tests ordered:  NA

## 2018-02-19 ENCOUNTER — Non-Acute Institutional Stay: Payer: Medicare Other | Admitting: Adult Health

## 2018-02-19 ENCOUNTER — Encounter: Payer: Self-pay | Admitting: Adult Health

## 2018-02-19 DIAGNOSIS — F5105 Insomnia due to other mental disorder: Secondary | ICD-10-CM

## 2018-02-19 DIAGNOSIS — R4701 Aphasia: Secondary | ICD-10-CM

## 2018-02-19 DIAGNOSIS — N200 Calculus of kidney: Secondary | ICD-10-CM | POA: Diagnosis not present

## 2018-02-19 DIAGNOSIS — I10 Essential (primary) hypertension: Secondary | ICD-10-CM | POA: Diagnosis not present

## 2018-02-19 DIAGNOSIS — I680 Cerebral amyloid angiopathy: Secondary | ICD-10-CM

## 2018-02-19 NOTE — Assessment & Plan Note (Signed)
She shows signs of less verbalization over time and inability to communicate her needs.

## 2018-02-19 NOTE — Assessment & Plan Note (Signed)
Has not had any pain or fever.

## 2018-02-19 NOTE — Assessment & Plan Note (Signed)
Slow progression in disease with less verbalization and more falls. Remains pleasant with staff, no aggression. Would continue zyprexa for hallucinations/delusions. The decreased ativan dosing seems to have been a good thing for her at this point.

## 2018-02-19 NOTE — Assessment & Plan Note (Signed)
Intermittently elevated. Would avoid aggressive treatment due to falls. If consistently elevated we will increase the [place]

## 2018-02-19 NOTE — Progress Notes (Signed)
Location:  Occupational psychologist of Service:  ALF (13) Provider:   Cindi Carbon, ANP [place] [phone]   Gayland Curry, DO  Patient Care Team: Gayland Curry, DO as PCP - General (Geriatric Medicine) Irine Seal, MD as Attending Physician (Urology) Jodi Marble, MD as Consulting Physician (Otolaryngology) Martinique, Amy, MD as Consulting Physician (Dermatology) Gerda Diss, DO as Consulting Physician (Family Medicine) Deneise Lever, MD as Consulting Physician (Pulmonary Disease) Arta Silence, MD as Consulting Physician (Gastroenterology) Dohmeier, Asencion Partridge, MD as Consulting Physician (Neurology) Geanie Kenning, MD as Consulting Physician (Psychiatry)  Extended Emergency Contact Information Primary Emergency Contact: [name] Address: [address]          [place], [place] [address] [name] of [place] Phone: [phone] Mobile Phone: [phone] Relation: Spouse  Code Status:  DNR Goals of care: Advanced Directive information Advanced Directives 01/21/2018  Does Patient Have a Medical Advance Directive? Yes  Type of Paramedic of [place];Living will;Out of facility DNR (pink MOST or yellow form)  Does patient want to make changes to medical advance directive? -  Copy of [place] in Chart? Yes  Would patient like information on creating a medical advance directive? -  Pre-existing out of facility DNR order (yellow form or pink MOST form) Yellow form placed in chart (order not valid for inpatient use)     Chief Complaint  Patient presents with  . Medical Management of Chronic Issues    HPI:  Pt is a 77 y.o. female seen today for medical management of chronic diseases.   She resides in skilled care due to dementia associated with cerebral amyloid angiopathy.  The nurse reports she has been crying less. Last week we reduced the ativan to see if it would help  (?rebound anxiety).  She continues to ambulate throughout the memory care unit and goes through the nurses cart and other residents rooms. She is never violent and is typically easily directed. She has delusions on occasion as well.  She has had several falls recently that appear to be mechanical in nature but luckily no major injuries. Her husband and daughter visit regularly.   Her bp has been intermittently elevated, other times within normal limits.   She has a hx of recurrent kidney stones which have not been an issue recently.   Sleeps well per staff on trazodone and restoril.   Past Medical History:  Diagnosis Date  . Arthritis    back and neck  . Carotid artery stenosis   . Cerebral amyloid angiopathy (CODE) 06/20/2016  . Chronic rhinosinusitis   . DDD (degenerative disc disease)   . Depression   . Diverticulosis of colon   . Frequency of urination   . Glaucoma   . Headache disorder 12/13/2014   Right temple  . Hearing loss   . History of kidney stones    MULTIBLE SURGICAL INTERVENTIONS  . History of skin cancer HX TOPICAL FACIAL [place] 2013  . History of TIA (transient ischemic attack)    NOV 2013-- NO RESIDUAL  . Hypertension   . IBS (irritable bowel syndrome)   . Memory difficulty 05/14/2016  . Memory loss   . Mild asthma    COLD INDUCED  . Right ureteral stone   . Seasonal and perennial allergic rhinitis   . Shingles    EYEBALL 09-17-2013  . Spondylosis    Past Surgical History:  Procedure Laterality Date  . BUNIONECTOMY  BILATERAL  . CALDWELL LUC     sinus drainage procedure  . CYSTO/ BILATERAL URETEROSCOPIC STONE EXTRACTIONS  02-09-2003  . CYSTO/ BILATERAL URETEROSCOPY/  LASER OF STONES LEFT SIDE  06-02-2007  . CYSTOSCOPY/RETROGRADE/URETEROSCOPY/STONE EXTRACTION WITH BASKET  03/31/2012   Procedure: CYSTOSCOPY/RETROGRADE/URETEROSCOPY/STONE EXTRACTION WITH BASKET;  Surgeon: Malka So, MD;  Location: [facility];  Service: Urology;   Laterality: Right;   . CYSTOSCOPY/RETROGRADE/URETEROSCOPY/STONE EXTRACTION WITH BASKET Right 03/29/2013   Procedure: CYSTOSCOPY//URETEROSCOPY/STONE EXTRACTION WITH BASKET;  Surgeon: Malka So, MD;  [facility];  Service: Urology;  Laterality: Right;  . CYSTOSCOPY/RETROGRADE/URETEROSCOPY/STONE EXTRACTION WITH BASKET Right 10/14/2013   Procedure: CYSTOSCOPY/RETROGRADE/URETEROSCOPY/STONE EXTRACTION WITH BASKET;  Surgeon: Irine Seal, MD;  Location: [facility];  Service: Urology;  Laterality: Right;  . ESOPHAGOGASTRODUODENOSCOPY (EGD) WITH PROPOFOL N/A 04/12/2015   Procedure: ESOPHAGOGASTRODUODENOSCOPY (EGD) WITH PROPOFOL;  Surgeon: Arta Silence, MD;  [facility];  Service: Endoscopy;  Laterality: N/A;  . EUS N/A 04/12/2015   Procedure: ESOPHAGEAL ENDOSCOPIC ULTRASOUND (EUS) RADIAL;  Surgeon: Arta Silence, MD;  Location: [facility];  Service: Endoscopy;  Laterality: N/A;  . EXTRACORPOREAL SHOCK WAVE LITHOTRIPSY  about 2440/ Dr Reece Agar   Never completed procedure-could not tolerate pain. Had seizures due to pain  . EYE SURGERY  AGE 45   INJURY  . HERNIA REPAIR  AGE 82  . HOLMIUM LASER APPLICATION Right 09/18/7251   Procedure: HOLMIUM LASER APPLICATION;  Surgeon: Irine Seal, MD;  Location: [facility];  Service: Urology;  Laterality: Right;  . LAPAROSCOPIC CHOLECYSTECTOMY  01-12-2005  . LEFT URETEROSCOPIC STONE EXTRACTION    LAST ONE 04-28-2009   SEVERAL SURGICAL STONE EXTRACTIONS  . LUMBAR LAMINECTOMY  09-30-2002   L4 - 5;  SPONDYLOLISTHISES W/ STENOSIS AND RADICULOPATHY  . PERCUTANEOUS NEPHROSTOLITHOTOMY  1975  . RIGHT URETEROSCOPIC STONE EXTRACTION  LAST ONE 02-21-2011   MULTIPLE SURGICAL STONE EXTRACTIONS  . TONSILECTOMY, ADENOIDECTOMY, BILATERAL MYRINGOTOMY AND TUBES  CHILD  . VAGINAL HYSTERECTOMY  1991   W/ BILATERAL SALPINGOOPHECTOMY    Allergies  Allergen Reactions  . Donepezil Hcl   . Ace Inhibitors Cough  . Hctz  [Hydrochlorothiazide]     Bladder problems  . Triptans     Avoid per neuro    Outpatient Encounter Medications as of 02/19/2018  Medication Sig  . LORazepam (ATIVAN) 0.5 MG tablet Take 0.25 mg by mouth every 8 (eight) hours.  Marland Kitchen aliskiren (TEKTURNA) 150 MG tablet Take 0.5 tablets (75 mg total) by mouth daily.  Marland Kitchen aspirin EC 81 MG tablet Take 81 mg by mouth daily.  Marland Kitchen atorvastatin (LIPITOR) 10 MG tablet Take 10 mg by mouth daily at 6 PM.  . Cranberry 500 MG TABS Take 1 tablet by mouth daily.  Marland Kitchen docusate sodium (COLACE) 100 MG capsule Take 100 mg by mouth daily as needed for mild constipation or moderate constipation.  . DULoxetine (CYMBALTA) 30 MG capsule Take 60 mg by mouth daily.   Marland Kitchen estradiol (ESTRACE) 1 MG tablet Take 1 mg by mouth every other day.   . Multiple Vitamins-Minerals (MULTIVITAMINS THER. W/MINERALS) TABS tablet Take 1 tablet daily by mouth.  . OLANZapine (ZYPREXA) 10 MG tablet Take 10 mg by mouth at bedtime.  . temazepam (RESTORIL) 15 MG capsule Take 15 mg by mouth at bedtime.  . traZODone (DESYREL) 100 MG tablet Take 150 mg by mouth at bedtime.   . [DISCONTINUED] LORazepam (ATIVAN) 1 MG tablet Take 0.5 mg by mouth 3 (three) times daily with meals. May also take  0.5 mg by mouth every 4 hours as needed for agitation or restlessness  . [DISCONTINUED] montelukast (SINGULAIR) 10 MG tablet Take 1 tablet (10 mg total) by mouth daily.   No facility-administered encounter medications on file as of 02/19/2018.     Review of Systems  Unable to perform ROS: Dementia    Immunization History  Administered Date(s) Administered  . Hepatitis A 11/15/2003, 06/11/2004, 12/25/2012  . Hepatitis B 11/14/2008, 12/15/2008  . Influenza Split 06/17/2011  . Influenza Whole 06/16/2010  . Influenza, High Dose Seasonal PF 05/27/2016  . Influenza-Unspecified 07/10/2017  . Pneumococcal Conjugate-13 08/09/2014  . Pneumococcal Polysaccharide-23 04/22/2011, 11/15/2011  . Tdap 11/14/2008, 12/25/2012  .  Typhoid Inactivated 12/25/2012  . Yellow Fever 12/25/2012  . Zoster 05/07/2012  . Zoster Recombinat (Shingrix) 01/03/2017, 04/04/2017   Pertinent  Health Maintenance Due  Topic Date Due  . DEXA SCAN  02/01/2008  . INFLUENZA VACCINE  04/16/2018  . PNA vac Low Risk Adult  Completed  . COLONOSCOPY  Discontinued   Fall Risk  01/21/2018 10/29/2016  Falls in the past year? Yes Yes  Number falls in past yr: 2 or more 2 or more  Injury with Fall? No Yes  Follow up - Falls prevention discussed   Functional Status Survey:    Vitals:   02/19/18 1608  BP: (!) 151/76  Pulse: 85  Resp: 20  Temp: 98 F (36.7 C)  SpO2: 95%  Weight: 144 lb 12.8 oz (65.7 kg)   Body mass index is 26.48 kg/m. Physical Exam  Constitutional: No distress.  HENT:  Head: Normocephalic and atraumatic.  Neck: No JVD present.  Cardiovascular: Normal rate and regular rhythm.  No murmur heard. Pulmonary/Chest: Effort normal and breath sounds normal. No respiratory distress. She has no wheezes.  Abdominal: Soft. Bowel sounds are normal. She exhibits no distension.  Neurological: She is alert.  No focal deficit but can not follow commads  Skin: Skin is warm and dry. She is not diaphoretic.  Psychiatric:  She cried when I began to speak to her about her family but was easily redirected. Flat affect    Labs reviewed: Recent Labs    03/10/17 1122 03/13/17 11/14/17  NA 139 142 142  K 3.2* 4.2 4.9  BUN 18 19 15   CREATININE 0.9 1.0 0.9   No results for input(s): AST, ALT, ALKPHOS, BILITOT, PROT, ALBUMIN in the last 8760 hours. Recent Labs    03/10/17 1122 03/13/17 11/14/17  WBC 14.1 6.6 6.4  NEUTROABS  --  5  --   HGB 12.8 12.8 14.0  HCT 39 39 41  PLT 156 240 191   Lab Results  Component Value Date   TSH 0.832 09/28/2016   Lab Results  Component Value Date   HGBA1C 5.4 11/14/2017   Lab Results  Component Value Date   CHOL 183 11/14/2017   HDL 64 11/14/2017   LDLCALC 102 11/14/2017   TRIG 86  11/14/2017   CHOLHDL 2.9 07/15/2016    Significant Diagnostic Results in last 30 days:  No results found.  Assessment/Plan  Cerebral amyloid angiopathy (CODE) Slow progression in disease with less verbalization and more falls. Remains pleasant with staff, no aggression. Would continue zyprexa for hallucinations/delusions. The decreased ativan dosing seems to have been a good thing for her at this point.    Essential hypertension Intermittently elevated. Would avoid aggressive treatment due to falls. If consistently elevated we will increase the [place]  Calcium oxalate kidney stones Has not had any pain  or fever.   Expressive aphasia She shows signs of less verbalization over time and inability to communicate her needs.   Insomnia due to mental disorder Controlled with trazodone and restoril. Would not taper these meds given her issues with crying episodes and hyperactivity during the day.     Family/ staff Communication: staff  Labs/tests ordered:  NA

## 2018-02-19 NOTE — Assessment & Plan Note (Signed)
Controlled with trazodone and restoril. Would not taper these meds given her issues with crying episodes and hyperactivity during the day.

## 2018-02-28 LAB — HEMOGLOBIN A1C: Hemoglobin A1C: 5.7

## 2018-02-28 LAB — BASIC METABOLIC PANEL
BUN: 16 (ref 4–21)
Creatinine: 1 (ref 0.5–1.1)
Glucose: 104
Potassium: 4.4 (ref 3.4–5.3)
Sodium: 143 (ref 137–147)

## 2018-02-28 LAB — CBC AND DIFFERENTIAL
HCT: 41 (ref 36–46)
Hemoglobin: 13.8 (ref 12.0–16.0)
Neutrophils Absolute: 4
Platelets: 198 (ref 150–399)
WBC: 6.8

## 2018-03-10 DIAGNOSIS — L84 Corns and callosities: Secondary | ICD-10-CM | POA: Diagnosis not present

## 2018-03-10 DIAGNOSIS — L603 Nail dystrophy: Secondary | ICD-10-CM | POA: Diagnosis not present

## 2018-04-21 ENCOUNTER — Non-Acute Institutional Stay: Payer: Medicare Other | Admitting: Internal Medicine

## 2018-04-21 ENCOUNTER — Encounter: Payer: Self-pay | Admitting: Internal Medicine

## 2018-04-21 DIAGNOSIS — F5105 Insomnia due to other mental disorder: Secondary | ICD-10-CM | POA: Diagnosis not present

## 2018-04-21 DIAGNOSIS — M8589 Other specified disorders of bone density and structure, multiple sites: Secondary | ICD-10-CM | POA: Diagnosis not present

## 2018-04-21 DIAGNOSIS — E782 Mixed hyperlipidemia: Secondary | ICD-10-CM

## 2018-04-21 DIAGNOSIS — F0391 Unspecified dementia with behavioral disturbance: Secondary | ICD-10-CM | POA: Diagnosis not present

## 2018-04-21 DIAGNOSIS — I680 Cerebral amyloid angiopathy: Secondary | ICD-10-CM

## 2018-04-21 DIAGNOSIS — F03918 Unspecified dementia, unspecified severity, with other behavioral disturbance: Secondary | ICD-10-CM

## 2018-04-21 DIAGNOSIS — I10 Essential (primary) hypertension: Secondary | ICD-10-CM

## 2018-04-21 NOTE — Progress Notes (Signed)
Patient ID: Ashley [name], female   DOB: 08/23/1941, 77 y.o.   MRN: [id number]  Location:  [facility] Room Number: 202 memory care Place of Service:  ALF [phone]) Provider:  Gayland Curry, DO  Patient Care Team: Gayland Curry, DO as PCP - General (Geriatric Medicine) Irine Seal, MD as Attending Physician (Urology) Jodi Marble, MD as Consulting Physician (Otolaryngology) Martinique, Amy, MD as Consulting Physician (Dermatology) Gerda Diss, DO as Consulting Physician (Family Medicine) Deneise Lever, MD as Consulting Physician (Pulmonary Disease) Arta Silence, MD as Consulting Physician (Gastroenterology) Dohmeier, Asencion Partridge, MD as Consulting Physician (Neurology) Geanie Kenning, MD as Consulting Physician (Psychiatry)  Extended Emergency Contact Information Primary Emergency Contact: [name] Address: [address]          [place], [place] [address] [name] of [place] Phone: [phone] Mobile Phone: [phone] Relation: Spouse  Code Status:  DNR Goals of care: Advanced Directive information Advanced Directives 04/21/2018  Does Patient Have a Medical Advance Directive? Yes  Type of Paramedic of [place];Living will;Out of facility DNR (pink MOST or yellow form)  Does patient want to make changes to medical advance directive? No - Patient declined  Copy of [place] in Chart? Yes  Would patient like information on creating a medical advance directive? -  Pre-existing out of facility DNR order (yellow form or pink MOST form) Yellow form placed in chart (order not valid for inpatient use)   Chief Complaint  Patient presents with  . Medical Management of Chronic Issues    Routine Visit    HPI:  Pt is a 77 y.o. female seen today for medical management of chronic diseases.    She has gained 9 lbs in 2 months.    BPs 120s-160s/60s-90s.  She is on tekturna 75mg  daily only  for bp.  Of course, BPs are likely with automatic cuff.    Hyperlipidemia with prior TIA, carotid a stenosis:  She remains on lipitor 10mg .  Also baby asa.  Dementia:  She continues to wander about the unit.   She has picked open a small piece of skin under her nose/upper lip which bled significantly.   She remains on trazodone for sleep and mood, restoril for sleep which has been the only effective med.  She is on zyprexa antipsychotic for her paranoid delusions.  No reports of related concerns today.  She's also on cymbalta for depression  No recent difficulty with her kidney stones related to primary hyperparathyroidism.    She remains on her estradiol for her osteopenia.  Past Medical History:  Diagnosis Date  . Arthritis    back and neck  . Carotid artery stenosis   . Cerebral amyloid angiopathy (CODE) 06/20/2016  . Chronic rhinosinusitis   . DDD (degenerative disc disease)   . Depression   . Diverticulosis of colon   . Frequency of urination   . Glaucoma   . Headache disorder 12/13/2014   Right temple  . Hearing loss   . History of kidney stones    MULTIBLE SURGICAL INTERVENTIONS  . History of skin cancer HX TOPICAL FACIAL [place] 2013  . History of TIA (transient ischemic attack)    NOV 2013-- NO RESIDUAL  . Hypertension   . IBS (irritable bowel syndrome)   . Memory difficulty 05/14/2016  . Memory loss   . Mild asthma    COLD INDUCED  . Right ureteral stone   . Seasonal and perennial  allergic rhinitis   . Shingles    EYEBALL 09-17-2013  . Spondylosis    Past Surgical History:  Procedure Laterality Date  . BUNIONECTOMY     BILATERAL  . CALDWELL LUC     sinus drainage procedure  . CYSTO/ BILATERAL URETEROSCOPIC STONE EXTRACTIONS  02-09-2003  . CYSTO/ BILATERAL URETEROSCOPY/  LASER OF STONES LEFT SIDE  06-02-2007  . CYSTOSCOPY/RETROGRADE/URETEROSCOPY/STONE EXTRACTION WITH BASKET  03/31/2012   Procedure: CYSTOSCOPY/RETROGRADE/URETEROSCOPY/STONE EXTRACTION WITH  BASKET;  Surgeon: Malka So, MD;  Location[facility];  Service: Urology;  Laterality: Right;   . CYSTOSCOPY/RETROGRADE/URETEROSCOPY/STONE EXTRACTION WITH BASKET Right 03/29/2013   Procedure: CYSTOSCOPY//URETEROSCOPY/STONE EXTRACTION WITH BASKET;  Surgeon: Malka So, MD;  Location: [facility];  Service: Urology;  Laterality: Right;  . CYSTOSCOPY/RETROGRADE/URETEROSCOPY/STONE EXTRACTION WITH BASKET Right 10/14/2013   Procedure: CYSTOSCOPY/RETROGRADE/URETEROSCOPY/STONE EXTRACTION WITH BASKET;  Surgeon: Irine Seal, MD;  Location: [facility];  Service: Urology;  Laterality: Right;  . ESOPHAGOGASTRODUODENOSCOPY (EGD) WITH PROPOFOL N/A 04/12/2015   Procedure: ESOPHAGOGASTRODUODENOSCOPY (EGD) WITH PROPOFOL;  Surgeon: Arta Silence, MD;  Location: [facility];  Service: Endoscopy;  Laterality: N/A;  . EUS N/A 04/12/2015   Procedure: ESOPHAGEAL ENDOSCOPIC ULTRASOUND (EUS) RADIAL;  Surgeon: Arta Silence, MD;  Location: [facility];  Service: Endoscopy;  Laterality: N/A;  . EXTRACORPOREAL SHOCK WAVE LITHOTRIPSY  about 3532/ Dr Reece Agar   Never completed procedure-could not tolerate pain. Had seizures due to pain  . EYE SURGERY  AGE 68   INJURY  . HERNIA REPAIR  AGE 23  . HOLMIUM LASER APPLICATION Right 9/92/4268   Procedure: HOLMIUM LASER APPLICATION;  Surgeon: Irine Seal, MD;  Location: [facility];  Service: Urology;  Laterality: Right;  . LAPAROSCOPIC CHOLECYSTECTOMY  01-12-2005  . LEFT URETEROSCOPIC STONE EXTRACTION    LAST ONE 04-28-2009   SEVERAL SURGICAL STONE EXTRACTIONS  . LUMBAR LAMINECTOMY  09-30-2002   L4 - 5;  SPONDYLOLISTHISES W/ STENOSIS AND RADICULOPATHY  . PERCUTANEOUS NEPHROSTOLITHOTOMY  1975  . RIGHT URETEROSCOPIC STONE EXTRACTION  LAST ONE 02-21-2011   MULTIPLE SURGICAL STONE EXTRACTIONS  . TONSILECTOMY, ADENOIDECTOMY, BILATERAL MYRINGOTOMY AND TUBES  CHILD  . VAGINAL HYSTERECTOMY  1991   W/ BILATERAL SALPINGOOPHECTOMY     Allergies  Allergen Reactions  . Donepezil Hcl   . Ace Inhibitors Cough  . Hctz [Hydrochlorothiazide]     Bladder problems  . Triptans     Avoid per neuro    Outpatient Encounter Medications as of 04/21/2018  Medication Sig  . aliskiren (TEKTURNA) 150 MG tablet Take 0.5 tablets (75 mg total) by mouth daily.  Marland Kitchen aspirin EC 81 MG tablet Take 81 mg by mouth daily.  Marland Kitchen atorvastatin (LIPITOR) 10 MG tablet Take 10 mg by mouth daily at 6 PM.  . Cranberry 500 MG TABS Take 1 tablet by mouth daily.  . DULoxetine (CYMBALTA) 30 MG capsule Take 60 mg by mouth daily.   Marland Kitchen estradiol (ESTRACE) 1 MG tablet Take 1 mg by mouth every other day.   Marland Kitchen LORazepam (ATIVAN) 0.5 MG tablet Take 0.25 mg by mouth 3 (three) times daily.   . Multiple Vitamins-Minerals (MULTIVITAMINS THER. W/MINERALS) TABS tablet Take 1 tablet daily by mouth.  . OLANZapine (ZYPREXA) 10 MG tablet Take 10 mg by mouth at bedtime.  . temazepam (RESTORIL) 15 MG capsule Take 15 mg by mouth at bedtime.  . traZODone (DESYREL) 100 MG tablet Take 150 mg by mouth at bedtime.   . [DISCONTINUED] montelukast (SINGULAIR) 10 MG tablet Take 10 mg by  mouth daily.  . [DISCONTINUED] docusate sodium (COLACE) 100 MG capsule Take 100 mg by mouth daily as needed for mild constipation or moderate constipation.  . [DISCONTINUED] montelukast (SINGULAIR) 10 MG tablet Take 1 tablet (10 mg total) by mouth daily.   No facility-administered encounter medications on file as of 04/21/2018.     Review of Systems  Constitutional: Negative for activity change.       Weight gain  HENT: Negative for congestion.   Eyes: Negative for visual disturbance.  Respiratory: Negative for shortness of breath.   Cardiovascular: Negative for chest pain and leg swelling.  Gastrointestinal: Negative for constipation.  Genitourinary: Negative for dysuria.  Musculoskeletal: Negative for gait problem.  Skin: Negative for color change.  Neurological: Negative for dizziness.   Psychiatric/Behavioral: Positive for behavioral problems and confusion.    Immunization History  Administered Date(s) Administered  . Hepatitis A 11/15/2003, 06/11/2004, 12/25/2012  . Hepatitis B 11/14/2008, 12/15/2008  . Influenza Split 06/17/2011  . Influenza Whole 06/16/2010  . Influenza, High Dose Seasonal PF 05/27/2016  . Influenza-Unspecified 07/10/2017  . Pneumococcal Conjugate-13 08/09/2014  . Pneumococcal Polysaccharide-23 04/22/2011, 11/15/2011  . Tdap 11/14/2008, 12/25/2012  . Typhoid Inactivated 12/25/2012  . Yellow Fever 12/25/2012  . Zoster 05/07/2012  . Zoster Recombinat (Shingrix) 01/03/2017, 04/04/2017   Pertinent  Health Maintenance Due  Topic Date Due  . DEXA SCAN  02/01/2008  . INFLUENZA VACCINE  04/16/2018  . PNA vac Low Risk Adult  Completed  . COLONOSCOPY  Discontinued   Fall Risk  01/21/2018 10/29/2016  Falls in the past year? Yes Yes  Number falls in past yr: 2 or more 2 or more  Injury with Fall? No Yes  Follow up - Falls prevention discussed   Functional Status Survey:    Vitals:   04/21/18 1030  BP: (!) 148/87  Pulse: 84  Resp: 18  Temp: 97.7 F (36.5 C)  TempSrc: Oral  SpO2: 96%  Weight: 153 lb (69.4 kg)  Height: 5\' 2"  (1.575 m)   Body mass index is 27.98 kg/m. Physical Exam  Constitutional: She appears well-developed and well-nourished. No distress.  HENT:  Head: Normocephalic and atraumatic.  Cardiovascular: Normal rate, regular rhythm, normal heart sounds and intact distal pulses.  Pulmonary/Chest: Effort normal and breath sounds normal. No respiratory distress.  Abdominal: Bowel sounds are normal.  Musculoskeletal: Normal range of motion.  Wandering about unit as usual  Neurological: She is alert.  Skin: Skin is warm and dry.  Small scab on upper lip beneath nose  Psychiatric: She has a normal mood and affect.  For her    Labs reviewed: Recent Labs    11/14/17  NA 142  K 4.9  BUN 15  CREATININE 0.9   No results  for input(s): AST, ALT, ALKPHOS, BILITOT, PROT, ALBUMIN in the last 8760 hours. Recent Labs    11/14/17  WBC 6.4  HGB 14.0  HCT 41  PLT 191   Lab Results  Component Value Date   TSH 1.62 01/05/2018   Lab Results  Component Value Date   HGBA1C 5.4 11/14/2017   Lab Results  Component Value Date   CHOL 183 11/14/2017   HDL 64 11/14/2017   LDLCALC 102 11/14/2017   TRIG 86 11/14/2017   CHOLHDL 2.9 07/15/2016   Assessment/Plan 1. Chronic dementia, with behavioral disturbance -stable, cont current regimen which has been working well, no new concerns from staff or her husband who visits regularly  2. Cerebral amyloid angiopathy (CODE) -cause of #1  remains   3. Insomnia due to mental disorder -stable, cont current regimen as detailed in hpi  4. Essential hypertension -bps running high at times, recommend manual recheck if over 825 systolic as per the standing orders -cont tekturna  5. Mixed hyperlipidemia -cont lipitor therapy due to prior TIA and no difficulty with tolerability or taking her pills--should this become an issue, would consider d/c  6. Osteopenia of multiple sites -ongoing, no longer on vitamins for this outside of multivitamin  Family/ staff Communication: discussed with memory care nurse  Labs/tests ordered:  No new  Arif Amendola L. Osten Janek, D.O. [employer] [address], [place] [address] Cell Phone (Mon-Fri 8am-5pm):  [phone] On Call:  [phone] & follow prompts after 5pm & weekends Office Phone:  [phone] Office Fax:  [phone]

## 2018-07-13 DIAGNOSIS — L603 Nail dystrophy: Secondary | ICD-10-CM | POA: Diagnosis not present

## 2018-07-13 DIAGNOSIS — L84 Corns and callosities: Secondary | ICD-10-CM | POA: Diagnosis not present

## 2018-07-16 DIAGNOSIS — Z23 Encounter for immunization: Secondary | ICD-10-CM | POA: Diagnosis not present

## 2018-08-24 ENCOUNTER — Non-Acute Institutional Stay: Payer: Medicare Other | Admitting: Adult Health

## 2018-08-24 DIAGNOSIS — I680 Cerebral amyloid angiopathy: Secondary | ICD-10-CM

## 2018-08-24 DIAGNOSIS — I10 Essential (primary) hypertension: Secondary | ICD-10-CM | POA: Diagnosis not present

## 2018-08-24 DIAGNOSIS — W19XXXA Unspecified fall, initial encounter: Secondary | ICD-10-CM | POA: Diagnosis not present

## 2018-08-24 DIAGNOSIS — F482 Pseudobulbar affect: Secondary | ICD-10-CM | POA: Diagnosis not present

## 2018-08-24 DIAGNOSIS — F5105 Insomnia due to other mental disorder: Secondary | ICD-10-CM

## 2018-08-24 NOTE — Progress Notes (Signed)
Location:  Occupational psychologist of Service:  ALF (13) Provider:   Cindi Carbon, ANP [place] [phone]  Gayland Curry, DO  Patient Care Team: Gayland Curry, DO as PCP - General (Geriatric Medicine) Irine Seal, MD as Attending Physician (Urology) Jodi Marble, MD as Consulting Physician (Otolaryngology) Martinique, Amy, MD as Consulting Physician (Dermatology) Gerda Diss, DO as Consulting Physician (Family Medicine) Deneise Lever, MD as Consulting Physician (Pulmonary Disease) Arta Silence, MD as Consulting Physician (Gastroenterology) Dohmeier, Asencion Partridge, MD as Consulting Physician (Neurology) Geanie Kenning, MD as Consulting Physician (Psychiatry)  Extended Emergency Contact Information Primary Emergency Contact: [name] Address: [address]          [place], [place] [address] [name] of [place] Phone: [phone] Mobile Phone: [phone] Relation: Spouse  Code Status:  DNR Goals of care: Advanced Directive information Advanced Directives 04/21/2018  Does Patient Have a Medical Advance Directive? Yes  Type of Paramedic of [place];Living will;Out of facility DNR (pink MOST or yellow form)  Does patient want to make changes to medical advance directive? No - Patient declined  Copy of [place] in Chart? Yes  Would patient like information on creating a medical advance directive? -  Pre-existing out of facility DNR order (yellow form or pink MOST form) Yellow form placed in chart (order not valid for inpatient use)     Chief Complaint  Patient presents with  . Medical Management of Chronic Issues    HPI:  Pt is a 77 y.o. female seen today for medical management of chronic diseases.  She resides in the memory care unit with a hx of cerebral amyloid angiopathy. MRI in 2017 showed moderate chronic microvascular ischemic disease and chronic small micro hemorrhages  c/w with the disease. She has shown signs of declining as she becomes weaker and has a worsening gait. She has had several falls recently that luckily have not causes significant injury, one this morning. She continues with periods of non provoked crying periodically and has a flat affect. She is not able to participate in conversation well and can not participate in MMSE testing. Bp is controlled. Weight has been trending upward. Her family had asked that we review her meds this year and remove any that were not necessary. The statin was removed as due to lack of benefit.   Past Medical History:  Diagnosis Date  . Arthritis    back and neck  . Carotid artery stenosis   . Cerebral amyloid angiopathy (CODE) 06/20/2016  . Chronic rhinosinusitis   . DDD (degenerative disc disease)   . Depression   . Diverticulosis of colon   . Frequency of urination   . Glaucoma   . Headache disorder 12/13/2014   Right temple  . Hearing loss   . History of kidney stones    MULTIBLE SURGICAL INTERVENTIONS  . History of skin cancer HX TOPICAL FACIAL [place] 2013  . History of TIA (transient ischemic attack)    NOV 2013-- NO RESIDUAL  . Hypertension   . IBS (irritable bowel syndrome)   . Memory difficulty 05/14/2016  . Memory loss   . Mild asthma    COLD INDUCED  . Right ureteral stone   . Seasonal and perennial allergic rhinitis   . Shingles    EYEBALL 09-17-2013  . Spondylosis    Past Surgical History:  Procedure Laterality Date  . BUNIONECTOMY     BILATERAL  .  CALDWELL LUC     sinus drainage procedure  . CYSTO/ BILATERAL URETEROSCOPIC STONE EXTRACTIONS  02-09-2003  . CYSTO/ BILATERAL URETEROSCOPY/  LASER OF STONES LEFT SIDE  06-02-2007  . CYSTOSCOPY/RETROGRADE/URETEROSCOPY/STONE EXTRACTION WITH BASKET  03/31/2012   Procedure: CYSTOSCOPY/RETROGRADE/URETEROSCOPY/STONE EXTRACTION WITH BASKET;  Surgeon: Malka So, MD;  Location: [facility];  Service: Urology;  Laterality: Right;     . CYSTOSCOPY/RETROGRADE/URETEROSCOPY/STONE EXTRACTION WITH BASKET Right 03/29/2013   Procedure: CYSTOSCOPY//URETEROSCOPY/STONE EXTRACTION WITH BASKET;  Surgeon: Malka So, MD;  Location: [facility];  Service: Urology;  Laterality: Right;  . CYSTOSCOPY/RETROGRADE/URETEROSCOPY/STONE EXTRACTION WITH BASKET Right 10/14/2013   Procedure: CYSTOSCOPY/RETROGRADE/URETEROSCOPY/STONE EXTRACTION WITH BASKET;  Surgeon: Irine Seal, MD;  Location: [facility];  Service: Urology;  Laterality: Right;  . ESOPHAGOGASTRODUODENOSCOPY (EGD) WITH PROPOFOL N/A 04/12/2015   Procedure: ESOPHAGOGASTRODUODENOSCOPY (EGD) WITH PROPOFOL;  Surgeon: Arta Silence, MD;  Location: [facility];  Service: Endoscopy;  Laterality: N/A;  . EUS N/A 04/12/2015   Procedure: ESOPHAGEAL ENDOSCOPIC ULTRASOUND (EUS) RADIAL;  Surgeon: Arta Silence, MD;  Location: [facility];  Service: Endoscopy;  Laterality: N/A;  . EXTRACORPOREAL SHOCK WAVE LITHOTRIPSY  about 1610/ Dr Reece Agar   Never completed procedure-could not tolerate pain. Had seizures due to pain  . EYE SURGERY  AGE 89   INJURY  . HERNIA REPAIR  AGE 61  . HOLMIUM LASER APPLICATION Right 9/60/4540   Procedure: HOLMIUM LASER APPLICATION;  Surgeon: Irine Seal, MD;  Location: [facility];  Service: Urology;  Laterality: Right;  . LAPAROSCOPIC CHOLECYSTECTOMY  01-12-2005  . LEFT URETEROSCOPIC STONE EXTRACTION    LAST ONE 04-28-2009   SEVERAL SURGICAL STONE EXTRACTIONS  . LUMBAR LAMINECTOMY  09-30-2002   L4 - 5;  SPONDYLOLISTHISES W/ STENOSIS AND RADICULOPATHY  . PERCUTANEOUS NEPHROSTOLITHOTOMY  1975  . RIGHT URETEROSCOPIC STONE EXTRACTION  LAST ONE 02-21-2011   MULTIPLE SURGICAL STONE EXTRACTIONS  . TONSILECTOMY, ADENOIDECTOMY, BILATERAL MYRINGOTOMY AND TUBES  CHILD  . VAGINAL HYSTERECTOMY  1991   W/ BILATERAL SALPINGOOPHECTOMY    Allergies  Allergen Reactions  . Donepezil Hcl   . Ace Inhibitors Cough  . Hctz [Hydrochlorothiazide]     Bladder  problems  . Triptans     Avoid per neuro    Outpatient Encounter Medications as of 08/24/2018  Medication Sig  . aliskiren (TEKTURNA) 150 MG tablet Take 0.5 tablets (75 mg total) by mouth daily.  Marland Kitchen aspirin EC 81 MG tablet Take 81 mg by mouth daily.  . Cranberry 500 MG TABS Take 1 tablet by mouth daily.  . DULoxetine (CYMBALTA) 30 MG capsule Take 60 mg by mouth daily.   Marland Kitchen estradiol (ESTRACE) 1 MG tablet Take 1 mg by mouth every other day.   Marland Kitchen LORazepam (ATIVAN) 0.5 MG tablet Take 0.25 mg by mouth 3 (three) times daily.   . Multiple Vitamins-Minerals (MULTIVITAMINS THER. W/MINERALS) TABS tablet Take 1 tablet daily by mouth.  . OLANZapine (ZYPREXA) 10 MG tablet Take 10 mg by mouth at bedtime.  . temazepam (RESTORIL) 15 MG capsule Take 15 mg by mouth at bedtime.  . traZODone (DESYREL) 100 MG tablet Take 150 mg by mouth at bedtime.   . [DISCONTINUED] atorvastatin (LIPITOR) 10 MG tablet Take 10 mg by mouth daily at 6 PM.   No facility-administered encounter medications on file as of 08/24/2018.     Review of Systems  Unable to perform ROS: Dementia    Immunization History  Administered Date(s) Administered  . Hepatitis A 11/15/2003, 06/11/2004, 12/25/2012  . Hepatitis B  11/14/2008, 12/15/2008  . Influenza Split 06/17/2011  . Influenza Whole 06/16/2010  . Influenza, High Dose Seasonal PF 05/27/2016  . Influenza,inj,Quad PF,6+ Mos 07/07/2018  . Influenza-Unspecified 07/10/2017  . Pneumococcal Conjugate-13 08/09/2014  . Pneumococcal Polysaccharide-23 04/22/2011, 11/15/2011  . Tdap 11/14/2008, 12/25/2012  . Typhoid Inactivated 12/25/2012  . Yellow Fever 12/25/2012  . Zoster 05/07/2012  . Zoster Recombinat (Shingrix) 01/03/2017, 04/04/2017   Pertinent  Health Maintenance Due  Topic Date Due  . DEXA SCAN  09/24/2018 (Originally 02/01/2008)  . INFLUENZA VACCINE  Completed  . PNA vac Low Risk Adult  Completed  . COLONOSCOPY  Discontinued   Fall Risk  01/21/2018 10/29/2016  Falls in the  past year? Yes Yes  Number falls in past yr: 2 or more 2 or more  Injury with Fall? No Yes  Follow up - Falls prevention discussed   Functional Status Survey:    Vitals:   08/25/18 0824  BP: (!) 142/80  Pulse: 89  Resp: 18  Temp: 98.9 F (37.2 C)  SpO2: 94%  Weight: 153 lb 9.6 oz (69.7 kg)   Body mass index is 28.09 kg/m.  Wt Readings from Last 3 Encounters:  08/25/18 153 lb 9.6 oz (69.7 kg)  04/21/18 153 lb (69.4 kg)  02/19/18 144 lb 12.8 oz (65.7 kg)   Physical Exam  Constitutional: No distress.  HENT:  Head: Normocephalic and atraumatic.  Eyes: Pupils are equal, round, and reactive to light. Conjunctivae are normal. Right eye exhibits no discharge. Left eye exhibits no discharge.  Neck: No JVD present.  Cardiovascular: Normal rate and regular rhythm.  No murmur heard. Pulmonary/Chest: Effort normal and breath sounds normal. No respiratory distress. She has no wheezes.  Abdominal: Soft. Bowel sounds are normal.  Musculoskeletal: Normal range of motion. She exhibits no edema or tenderness.  Neurological: She is alert.  Not oriented or able to answer q's. Had difficulty following commands for neuro checks but had no obvious focal deficit.  Skin: Skin is warm and dry. She is not diaphoretic.  Psychiatric:  flat  Nursing note and vitals reviewed.   Labs reviewed: Recent Labs    11/14/17  NA 142  K 4.9  BUN 15  CREATININE 0.9   No results for input(s): AST, ALT, ALKPHOS, BILITOT, PROT, ALBUMIN in the last 8760 hours. Recent Labs    11/14/17  WBC 6.4  HGB 14.0  HCT 41  PLT 191   Lab Results  Component Value Date   TSH 1.62 01/05/2018   Lab Results  Component Value Date   HGBA1C 5.4 11/14/2017   Lab Results  Component Value Date   CHOL 183 11/14/2017   HDL 64 11/14/2017   LDLCALC 102 11/14/2017   TRIG 86 11/14/2017   CHOLHDL 2.9 07/15/2016    Significant Diagnostic Results in last 30 days:  No results found.  Assessment/Plan 1. Cerebral  amyloid angiopathy (CODE) Continues with periods of crying and or anxiety but not worse from baseline in discussion with the nurses. She is showing signs of weakness, slowing and worsening gait. As below.   2. Essential hypertension Controlled. Continue tekturna  3. PBA (pseudobulbar affect) Continue Zyprexa 10 mg qd. Tapering ativan as below  4. Insomnia due to mental disorder Continue Restoril and trazodone for sleep at this time.   5. Fall, initial encounter Progressive weakness and worsening gait associated with advancing dementia. Will attempt to reduce ativan and monitor for worsening anxiety. Reduce ativan to 0.25 mg bid x 1 week then 0.25  mg qd no stop date   Family/ staff Communication: discussed with her nurse.   Labs/tests ordered:  NA

## 2018-08-25 ENCOUNTER — Encounter: Payer: Self-pay | Admitting: Adult Health

## 2018-09-15 ENCOUNTER — Other Ambulatory Visit: Payer: Self-pay | Admitting: Adult Health

## 2018-09-27 IMAGING — MR MR MRA HEAD W/O CM
1 series · 19 of 48 positions shown · non-contrast
Comparison: Brain MRI 07/14/2016.  Intracranial MRA 07/10/2012.

CLINICAL DATA: 75-year-old female with abnormal speech. Suspected
amyloid angiopathy. Initial encounter.

EXAM:
MRA HEAD WITHOUT CONTRAST
TECHNIQUE: Angiographic images of the Circle of [place] were obtained using MRA
technique without intravenous contrast.

[Series 3: (id) mt fs · axial · 1.4mm · 0.43mm/px · z∈[-54,+36]mm · 19 of 136 slices shown]
[im 1/136]
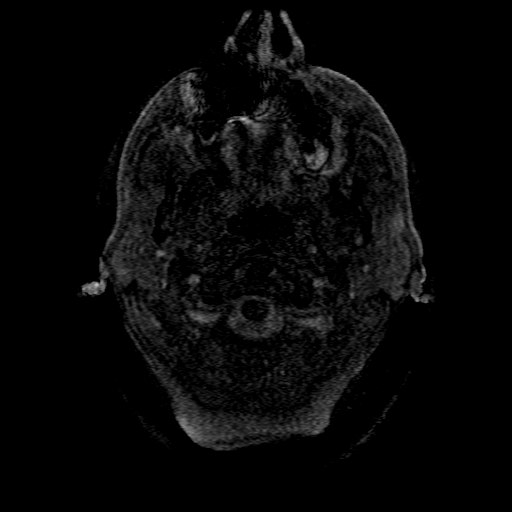
[im 3/136]
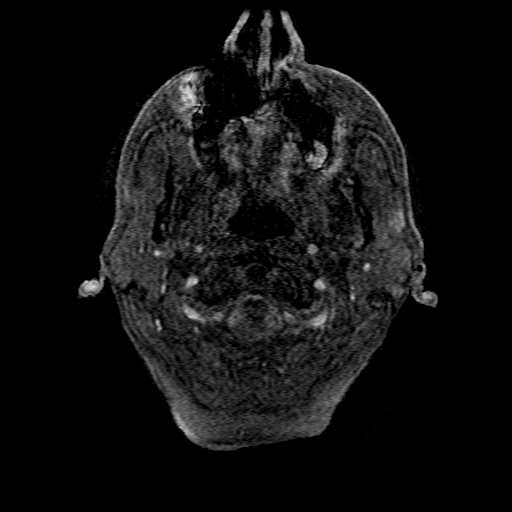
[im 6/136]
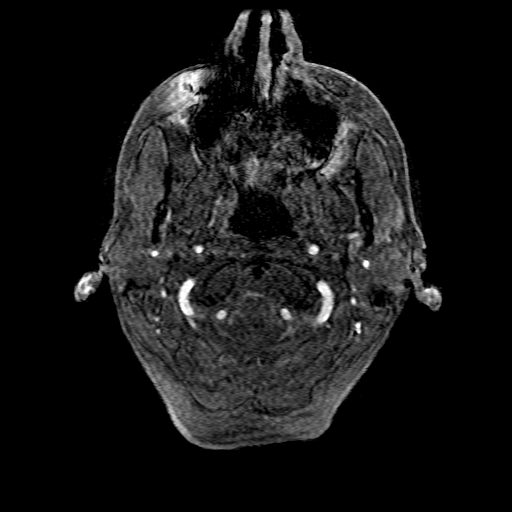
[im 9/136]
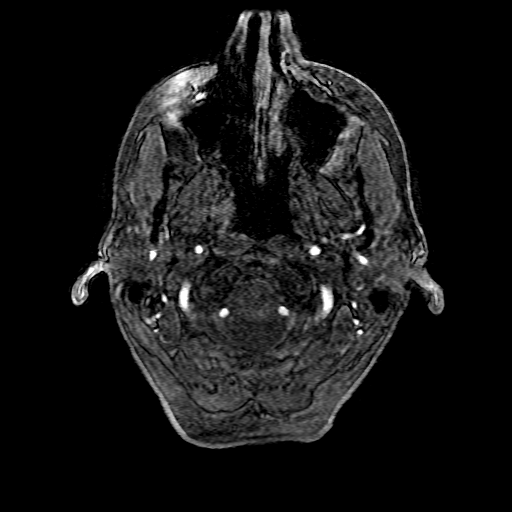
[im 12/136]
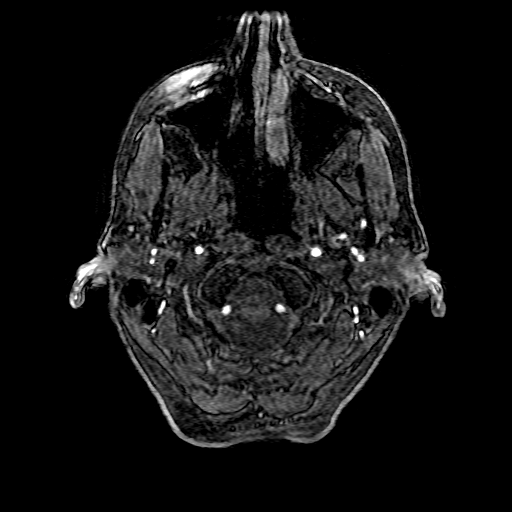
[im 15/136]
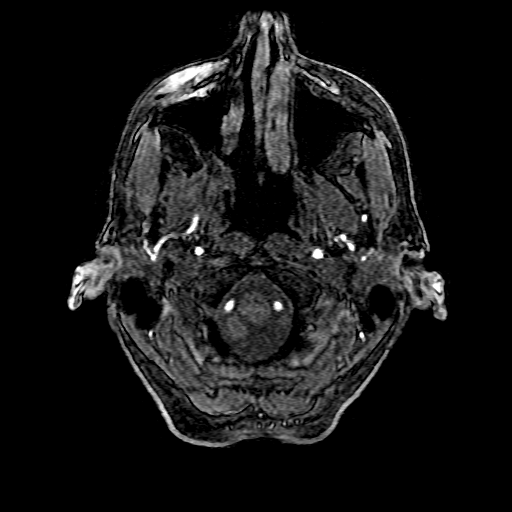
[im 18/136]
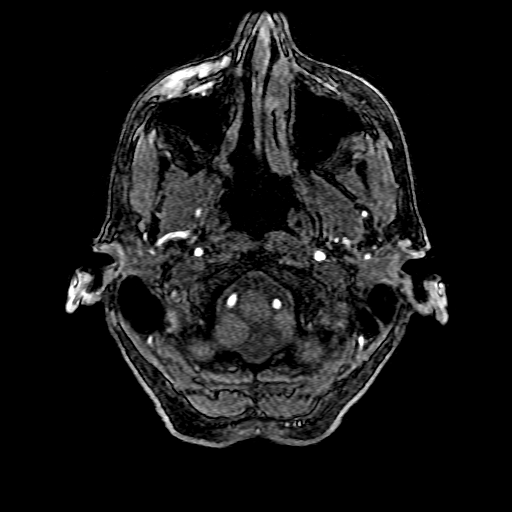
[im 21/136]
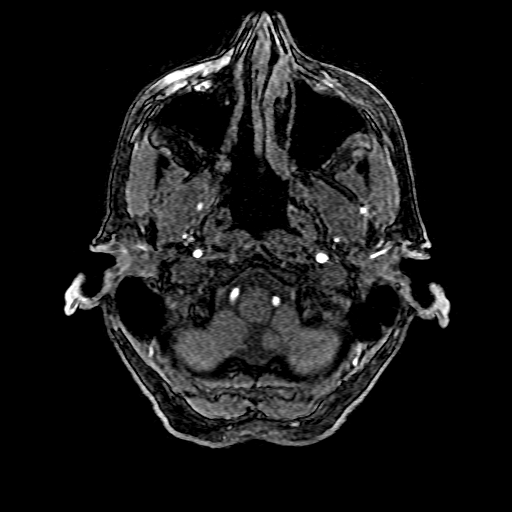
[im 23/136]
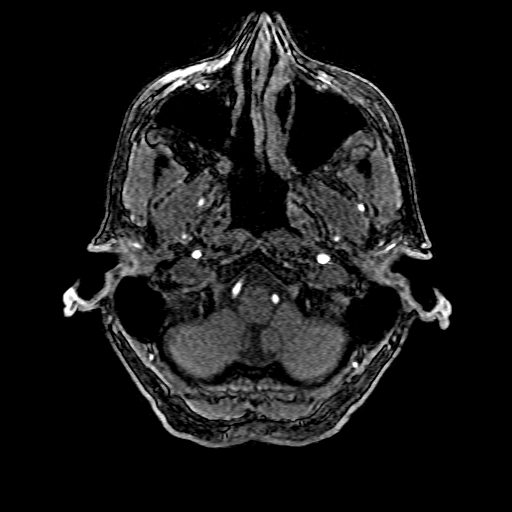
[im 26/136]
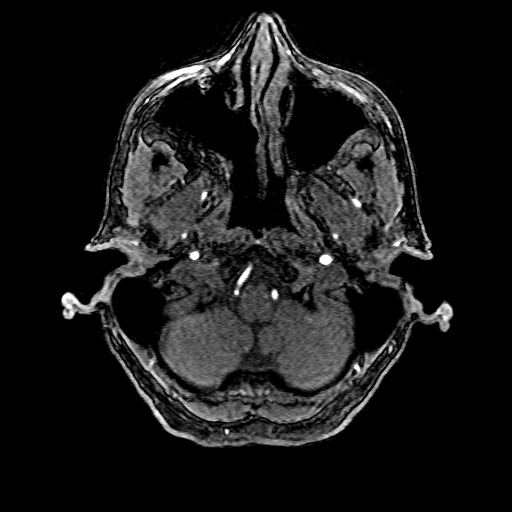
[im 29/136]
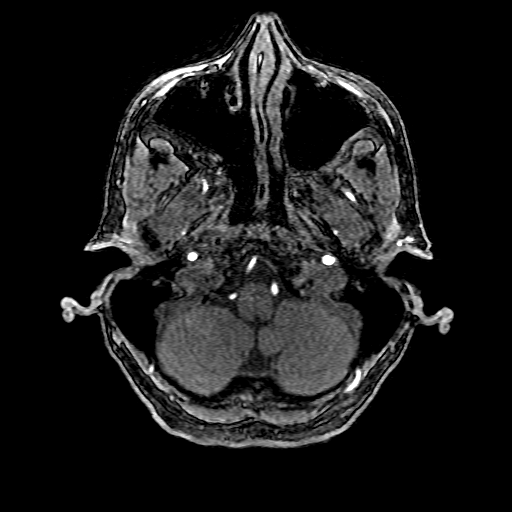
[im 44/136]
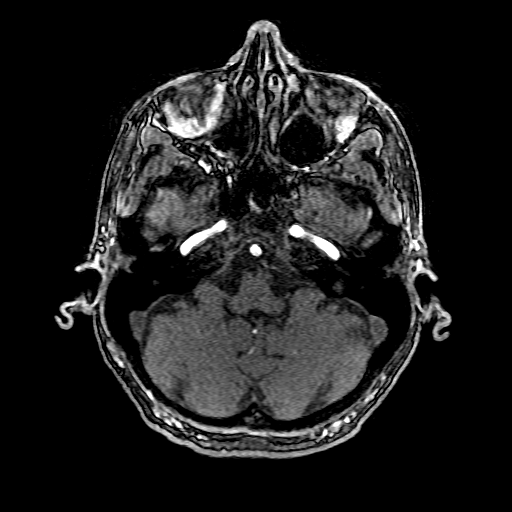
[im 61/136]
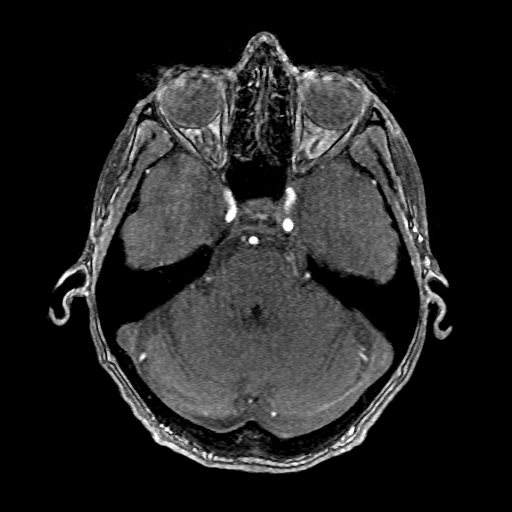
[im 69/136]
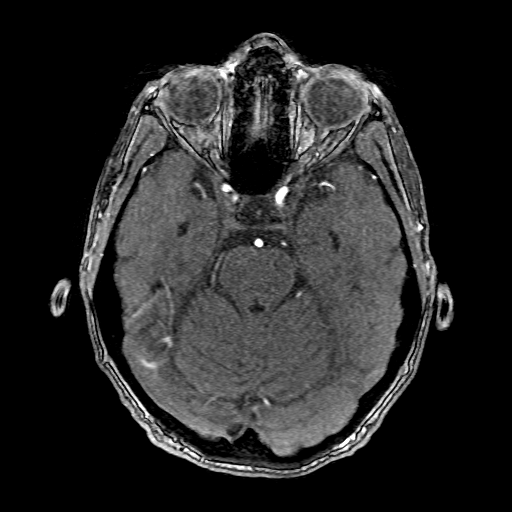
[im 78/136]
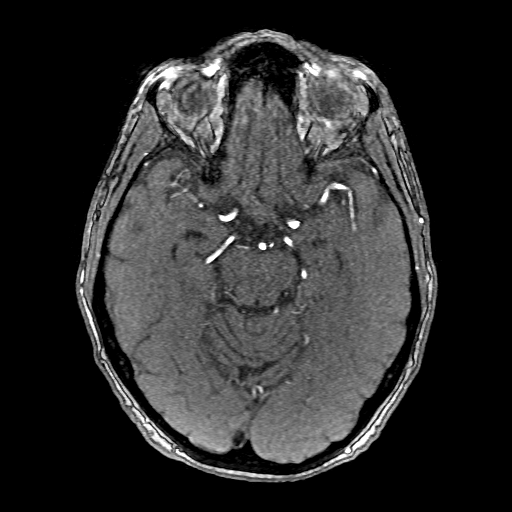
[im 95/136]
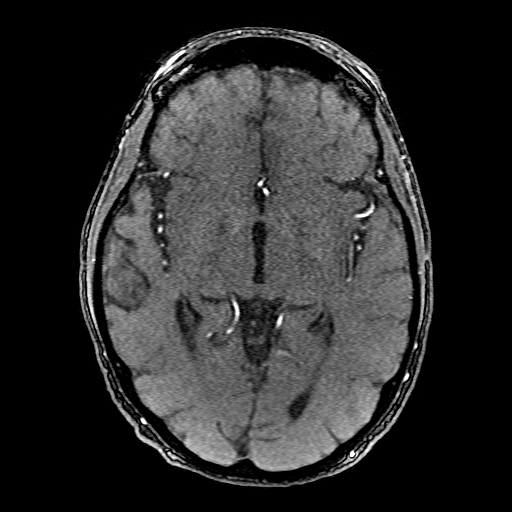
[im 113/136]
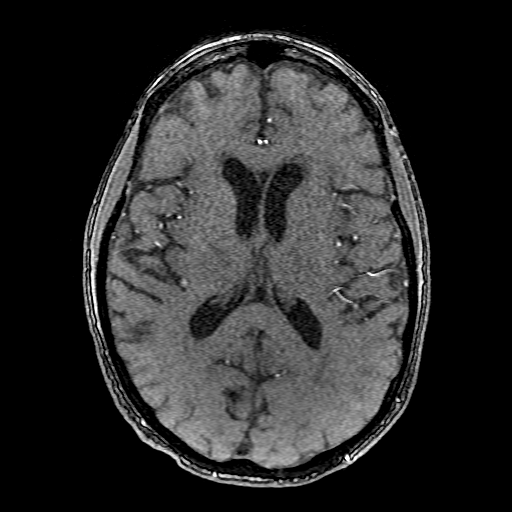
[im 115/136]
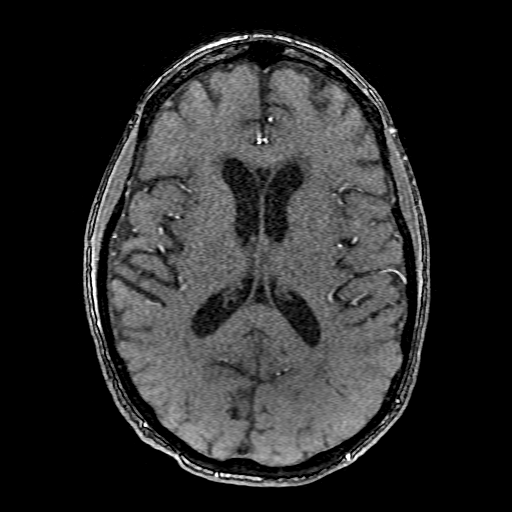
[im 130/136]
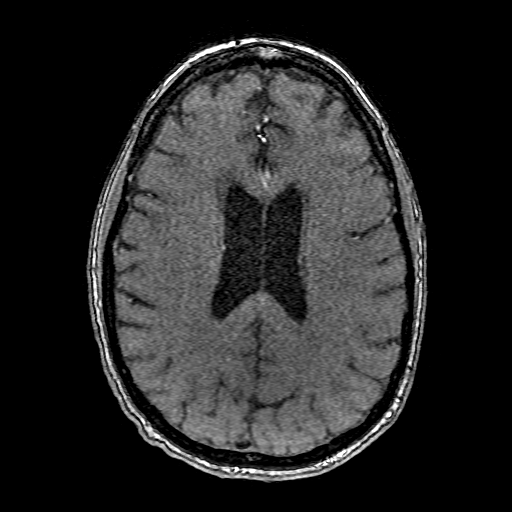

[19 of 48 positions shown; findings below may reference images not displayed]

FINDINGS: Stable antegrade flow in the posterior circulation with fairly
codominant distal vertebral arteries. No distal vertebral stenosis.
Normal right PICA origin. Probably dominant left AICA. Patent
vertebrobasilar junction and basilar artery without stenosis. AICA
and SCA origins are patent. Bilateral PCA origins are normal.
Posterior communicating arteries are diminutive or absent. Stable
and normal PCA branches.

Stable antegrade flow in both ICA siphons, the left appears mildly
dominant owing to the ACA anatomy. Normal ophthalmic artery origins.
Small left hypophyseal artery infundibulum suspected and stable. ICA
siphon irregularity compatible with atherosclerosis, but no siphon
stenosis. Patent carotid termini.

Diminutive or absent right ACA A1 segment. The right ACA is
primarily supplied from the left via the anterior communicating
artery. Normal MCA and left ACA origins. Anterior communicating
artery and visualized ACA branches are within normal limits.
Bilateral MCA branches appear stable since 7505. There is chronic
mild right mid M1 segment irregularity without significant stenosis.
IMPRESSION: Stable intracranial MRA since 7505. Intracranial atherosclerosis but
no significant intracranial stenosis.

## 2018-10-14 ENCOUNTER — Other Ambulatory Visit: Payer: Self-pay | Admitting: Internal Medicine

## 2018-10-14 DIAGNOSIS — F5105 Insomnia due to other mental disorder: Secondary | ICD-10-CM

## 2018-10-14 MED ORDER — TEMAZEPAM 15 MG PO CAPS: 15.0000 mg | ORAL_CAPSULE | Freq: Every day | ORAL | 0 refills | Status: DC

## 2018-10-26 ENCOUNTER — Other Ambulatory Visit: Payer: Self-pay | Admitting: Adult Health

## 2018-10-26 MED ORDER — LORAZEPAM 0.5 MG PO TABS: 0.2500 mg | ORAL_TABLET | Freq: Every day | ORAL | 0 refills | Status: DC

## 2018-11-09 ENCOUNTER — Other Ambulatory Visit: Payer: Self-pay | Admitting: Adult Health

## 2018-11-09 DIAGNOSIS — B351 Tinea unguium: Secondary | ICD-10-CM | POA: Diagnosis not present

## 2018-11-09 DIAGNOSIS — F5105 Insomnia due to other mental disorder: Secondary | ICD-10-CM

## 2018-11-09 DIAGNOSIS — L84 Corns and callosities: Secondary | ICD-10-CM | POA: Diagnosis not present

## 2018-11-09 MED ORDER — TEMAZEPAM 15 MG PO CAPS: 15.0000 mg | ORAL_CAPSULE | Freq: Every day | ORAL | 2 refills | Status: DC

## 2018-11-10 ENCOUNTER — Encounter: Payer: Self-pay | Admitting: Internal Medicine

## 2018-11-10 ENCOUNTER — Non-Acute Institutional Stay: Payer: Medicare Other | Admitting: Internal Medicine

## 2018-11-10 DIAGNOSIS — F5105 Insomnia due to other mental disorder: Secondary | ICD-10-CM | POA: Diagnosis not present

## 2018-11-10 DIAGNOSIS — F482 Pseudobulbar affect: Secondary | ICD-10-CM

## 2018-11-10 DIAGNOSIS — E210 Primary hyperparathyroidism: Secondary | ICD-10-CM

## 2018-11-10 DIAGNOSIS — F03918 Unspecified dementia, unspecified severity, with other behavioral disturbance: Secondary | ICD-10-CM

## 2018-11-10 DIAGNOSIS — R4701 Aphasia: Secondary | ICD-10-CM

## 2018-11-10 DIAGNOSIS — F0391 Unspecified dementia with behavioral disturbance: Secondary | ICD-10-CM | POA: Diagnosis not present

## 2018-11-10 DIAGNOSIS — I680 Cerebral amyloid angiopathy: Secondary | ICD-10-CM | POA: Diagnosis not present

## 2018-11-10 NOTE — Progress Notes (Signed)
  Subjective:     Patient ID: Ashley [name], female   DOB: 08-07-1941, 78 y.o.   MRN: [id number]  HPI  Patient is a 78 y.o. female seen today for medical management of chronic diseases. She currently resides in the memory care unit with a history of cerebral amyloid angiopathy. In 2017, MRI results showed moderate chronic microvascular ischemic disease with chronic small micro hemorrhages. Since last visit, she has consistently shown signs of decline as she becomes weaker and gait has worsened. Nursing staff reports a history of falling without injury. Patient is able to ambulate with a front rolling walker with one-person assist. She continues to have a flat affect with periods of non provoked crying. She is mostly nonverbal and not able to carry on a conversation or participate in MMSE testing. BP has been stable with medication. No new issues have been reported by nursing staff or family.    ROS: Unable to perform due to severe dementia     Objective:   Physical Exam Vitals signs and nursing note reviewed.  Constitutional:      General: She is not in acute distress. HENT:     Head: Normocephalic and atraumatic.     Mouth/Throat:     Mouth: Mucous membranes are moist.  Cardiovascular:     Rate and Rhythm: Normal rate and regular rhythm.     Heart sounds: No murmur.     Comments: No JVD present Pulmonary:     Effort: Pulmonary effort is normal. No respiratory distress.     Breath sounds: Normal breath sounds. No wheezing.  Abdominal:     General: Abdomen is flat. Bowel sounds are normal.     Palpations: Abdomen is soft.  Musculoskeletal: Normal range of motion.        General: No tenderness or signs of injury.     Right lower leg: No edema.     Left lower leg: No edema.  Skin:    General: Skin is warm and dry.     Capillary Refill: Capillary refill takes 2 to 3 seconds.     Findings: No bruising, lesion or rash.  Neurological:     General: No focal deficit present.   Mental Status: She is alert.     Motor: Weakness present.  Psychiatric:     Comments: Flat affect throughout exam        Assessment:    1. Cerebral amyloid angiopathy: Patient is stable and not showing further decline from last exam. Will continue to monitor weakness and gait.   2. Essential hypertension: Last recorded BP on 11/07/2018 was 140/78. BP is controlled with medication. Continue Tekturna 150 mg qd  3. Insomnia due to mental disorder: Continue Restoril and trazodone at night   4. Falls: Patient ambulates with one person assist and front rolling walker. Medications reduced previous visit to limit polypharmacy. Will continue to monitor for progressive weakness and worsening gait.   5. Flat affect: Continue Zyprexa 10 mg qd     Plan:    Labs: none ordered

## 2018-11-10 NOTE — Progress Notes (Signed)
[name] ID: Ashley [name], female   DOB: June 23, 1941, 78 y.o.   MRN: [id number]  Location:  [place] Room Number: [place] of Service:  ALF (13) Provider: Iyana Topor L. Mariea Clonts, D.O., C.M.D. Gayland Curry, DO  [name] Care Team: Gayland Curry, DO as PCP - General (Geriatric Medicine) Irine Seal, MD as Attending Physician (Urology) Jodi Marble, MD as Consulting Physician (Otolaryngology) Martinique, Amy, MD as Consulting Physician (Dermatology) Gerda Diss, DO as Consulting Physician (Family Medicine) Deneise Lever, MD as Consulting Physician (Pulmonary Disease) Arta Silence, MD as Consulting Physician (Gastroenterology) Dohmeier, Asencion Partridge, MD as Consulting Physician (Neurology) Geanie Kenning, MD as Consulting Physician (Psychiatry)  Extended Emergency Contact Information Primary Emergency Contact: [name] Address: [address]          [place], [place] [address] [name] of [place] Phone: [phone] Mobile Phone: [phone] Relation: Spouse  Code Status:  DNR Goals of care: Advanced Directive information Advanced Directives 11/10/2018  Does [name] Have a Medical Advance Directive? Yes  Type of Paramedic of [place];Out of facility DNR (pink MOST or yellow form)  Does [name] want to make changes to medical advance directive? No - [name] declined  Copy of [place] [address] in Chart? Yes - validated most recent copy scanned in chart (See row information)  Would [name] like information on creating a medical advance directive? -  Pre-existing out of facility DNR order (yellow form or pink MOST form) Yellow form placed in chart (order not valid for inpatient use)     Chief Complaint  [name] presents with  . Medical Management of Chronic Issues    Routine Visit    HPI:  Ashley [name] is a 78 y.o. female seen today for medical management of chronic diseases.    Ashley [name] seen  and examined after NP student.  Agree with Ashley [name] note.    Ashley [name] continues to gradually decline.  Ashley [name] is not longer up wandering around Ashley unit--Ashley [name] typically is seen sitting in a chair or sleeping during Ashley daytime when I am here.    Ashley [name] continues to reside in memory care for Ashley [name] cerebral amyloid angiopathy causing dementia.  Ashley [name] has recently had falls w/o injury.  Ashley [name] also has continued Ashley [name] pseudobulbar affect with unprovoked crying spells.  Weight is down one lb over recent months after a period of trending up.  Sleep has always been an issue and Ashley [name] is still requiring temazepam at hs for rest.     Past Medical History:  Diagnosis Date  . Arthritis    back and neck  . Carotid artery stenosis   . Cerebral amyloid angiopathy (CODE) 06/20/2016  . Chronic rhinosinusitis   . DDD (degenerative disc disease)   . Depression   . Diverticulosis of colon   . Frequency of urination   . Glaucoma   . Headache disorder 12/13/2014   Right temple  . Hearing loss   . History of kidney stones    MULTIBLE SURGICAL INTERVENTIONS  . History of skin cancer HX TOPICAL FACIAL [place] 2013  . History of TIA (transient ischemic attack)    NOV 2013-- NO RESIDUAL  . Hypertension   . IBS (irritable bowel syndrome)   . Memory difficulty 05/14/2016  . Memory loss   . Mild asthma    COLD INDUCED  . Right ureteral stone   . Seasonal and perennial allergic rhinitis   . Shingles    EYEBALL 09-17-2013  .  Spondylosis    Past Surgical History:  Procedure Laterality Date  . BUNIONECTOMY     BILATERAL  . CALDWELL LUC     sinus drainage procedure  . CYSTO/ BILATERAL URETEROSCOPIC STONE EXTRACTIONS  02-09-2003  . CYSTO/ BILATERAL URETEROSCOPY/  LASER OF STONES LEFT SIDE  06-02-2007  . CYSTOSCOPY/RETROGRADE/URETEROSCOPY/STONE EXTRACTION WITH BASKET  03/31/2012   Procedure: CYSTOSCOPY/RETROGRADE/URETEROSCOPY/STONE EXTRACTION WITH BASKET;  Surgeon: Malka So, MD;  Location: [facility];  Service:  Urology;  Laterality: Right;   . CYSTOSCOPY/RETROGRADE/URETEROSCOPY/STONE EXTRACTION WITH BASKET Right 03/29/2013   Procedure: CYSTOSCOPY//URETEROSCOPY/STONE EXTRACTION WITH BASKET;  Surgeon: Malka So, MD;  Location: [facility];  Service: Urology;  Laterality: Right;  . CYSTOSCOPY/RETROGRADE/URETEROSCOPY/STONE EXTRACTION WITH BASKET Right 10/14/2013   Procedure: CYSTOSCOPY/RETROGRADE/URETEROSCOPY/STONE EXTRACTION WITH BASKET;  Surgeon: Irine Seal, MD;  Location: [facility];  Service: Urology;  Laterality: Right;  . ESOPHAGOGASTRODUODENOSCOPY (EGD) WITH PROPOFOL N/A 04/12/2015   Procedure: ESOPHAGOGASTRODUODENOSCOPY (EGD) WITH PROPOFOL;  Surgeon: Arta Silence, MD;  Location: [facility];  Service: Endoscopy;  Laterality: N/A;  . EUS N/A 04/12/2015   Procedure: ESOPHAGEAL ENDOSCOPIC ULTRASOUND (EUS) RADIAL;  Surgeon: Arta Silence, MD;  Location: [facility];  Service: Endoscopy;  Laterality: N/A;  . EXTRACORPOREAL SHOCK WAVE LITHOTRIPSY  about 9622/ Dr Reece Agar   Never completed procedure-could not tolerate pain. Had seizures due to pain  . EYE SURGERY  AGE 75   INJURY  . HERNIA REPAIR  AGE 74  . HOLMIUM LASER APPLICATION Right 2/97/9892   Procedure: HOLMIUM LASER APPLICATION;  Surgeon: Irine Seal, MD;  Location: [facility];  Service: Urology;  Laterality: Right;  . LAPAROSCOPIC CHOLECYSTECTOMY  01-12-2005  . LEFT URETEROSCOPIC STONE EXTRACTION    LAST ONE 04-28-2009   SEVERAL SURGICAL STONE EXTRACTIONS  . LUMBAR LAMINECTOMY  09-30-2002   L4 - 5;  SPONDYLOLISTHISES W/ STENOSIS AND RADICULOPATHY  . PERCUTANEOUS NEPHROSTOLITHOTOMY  1975  . RIGHT URETEROSCOPIC STONE EXTRACTION  LAST ONE 02-21-2011   MULTIPLE SURGICAL STONE EXTRACTIONS  . TONSILECTOMY, ADENOIDECTOMY, BILATERAL MYRINGOTOMY AND TUBES  CHILD  . VAGINAL HYSTERECTOMY  1991   W/ BILATERAL SALPINGOOPHECTOMY    Allergies  Allergen Reactions  . Donepezil Hcl   . Ace Inhibitors Cough  . Hctz  [Hydrochlorothiazide]     Bladder problems  . Triptans     Avoid per neuro    Outpatient Encounter Medications as of 11/10/2018  Medication Sig  . aliskiren (TEKTURNA) 150 MG tablet Take 0.5 tablets (75 mg total) by mouth daily.  Marland Kitchen aspirin EC 81 MG tablet Take 81 mg by mouth daily.  . Cranberry 500 MG TABS Take 1 tablet by mouth daily.  . DULoxetine (CYMBALTA) 60 MG capsule Take 60 mg by mouth daily.   Marland Kitchen estradiol (ESTRACE) 1 MG tablet Take 1 mg by mouth every other day.   Marland Kitchen LORazepam (ATIVAN) 0.5 MG tablet Take 0.5 tablets (0.25 mg total) by mouth daily.  . Multiple Vitamins-Minerals (MULTIVITAMINS THER. W/MINERALS) TABS tablet Take 1 tablet daily by mouth.  . OLANZapine (ZYPREXA) 10 MG tablet Take 10 mg by mouth at bedtime.  . temazepam (RESTORIL) 15 MG capsule Take 1 capsule (15 mg total) by mouth at bedtime.  . traZODone (DESYREL) 150 MG tablet Take 150 mg by mouth at bedtime.   . [DISCONTINUED] DULoxetine (CYMBALTA) 30 MG capsule Take 60 mg by mouth daily.   . [DISCONTINUED] traZODone (DESYREL) 100 MG tablet Take 150 mg by mouth at bedtime.    No facility-administered encounter medications on file  as of 11/10/2018.     Review of Systems  Unable to perform ROS: Dementia (obtained from staff)  Constitutional: Positive for activity change and fatigue. Negative for appetite change, fever and unexpected weight change.  HENT: Negative for congestion.   Eyes: Negative for visual disturbance.  Respiratory: Negative for cough and shortness of breath.   Cardiovascular: Negative for chest pain, palpitations and leg swelling.  Gastrointestinal: Negative for abdominal pain, blood in stool, diarrhea, nausea and vomiting.  Genitourinary: Negative for dysuria.       Incontinence  Musculoskeletal: Positive for gait problem. Negative for arthralgias and joint swelling.  Skin: Positive for pallor. Negative for color change.    Immunization History  Administered Date(s) Administered  .  Hepatitis A 11/15/2003, 06/11/2004, 12/25/2012  . Hepatitis B 11/14/2008, 12/15/2008  . Influenza Split 06/17/2011  . Influenza Whole 06/16/2010  . Influenza, High Dose Seasonal PF 05/27/2016  . Influenza,inj,Quad PF,6+ Mos 07/07/2018  . Influenza-Unspecified 07/10/2017  . Pneumococcal Conjugate-13 08/09/2014  . Pneumococcal Polysaccharide-23 04/22/2011, 11/15/2011  . Tdap 11/14/2008, 12/25/2012  . Typhoid Inactivated 12/25/2012  . Yellow Fever 12/25/2012  . Zoster 05/07/2012  . Zoster Recombinat (Shingrix) 01/03/2017, 04/04/2017   Pertinent  Health Maintenance Due  Topic Date Due  . DEXA SCAN  02/01/2008  . INFLUENZA VACCINE  Completed  . PNA vac Low Risk Adult  Completed  . COLONOSCOPY  Discontinued   Fall Risk  01/21/2018 10/29/2016  Falls in Ashley past year? Yes Yes  Number falls in past yr: 2 or more 2 or more  Injury with Fall? No Yes  Follow up - Falls prevention discussed   Functional Status Survey:    Vitals:   11/10/18 1343  BP: 140/78  Pulse: 83  Resp: 18  Temp: 98.3 F (36.8 C)  TempSrc: Oral  SpO2: 94%  Weight: 152 lb (68.9 kg)  Height: 5\' 2"  (1.575 m)   Body mass index is 27.8 kg/m. Physical Exam Vitals signs and nursing note reviewed.  Constitutional:      General: Ashley [name] is not in acute distress.    Appearance: Ashley [name] is normal weight. Ashley [name] is not toxic-appearing.     Comments: Sleepy resting in recliner chair  HENT:     Head: Normocephalic and atraumatic.  Cardiovascular:     Rate and Rhythm: Normal rate and regular rhythm.     Pulses: Normal pulses.     Heart sounds: Normal heart sounds.  Pulmonary:     Breath sounds: Normal breath sounds.  Abdominal:     General: Bowel sounds are normal.     Palpations: Abdomen is soft.  Skin:    Capillary Refill: Capillary refill takes less than 2 seconds.  Neurological:     Mental Status: Ashley [name] is disoriented.     Motor: Weakness present.     Coordination: Coordination abnormal.     Gait: Gait abnormal.      Comments: Decreasing speech, not interactive like Ashley [name] once was     Labs reviewed: Recent Labs    11/14/17  NA 142  K 4.9  BUN 15  CREATININE 0.9   No results for input(s): AST, ALT, ALKPHOS, BILITOT, PROT, ALBUMIN in Ashley last 8760 hours. Recent Labs    11/14/17  WBC 6.4  HGB 14.0  HCT 41  PLT 191   Lab Results  Component Value Date   TSH 1.62 01/05/2018   Lab Results  Component Value Date   HGBA1C 5.4 11/14/2017   Lab Results  Component Value Date  CHOL 183 11/14/2017   HDL 64 11/14/2017   LDLCALC 102 11/14/2017   TRIG 86 11/14/2017   CHOLHDL 2.9 07/15/2016    Assessment/Plan 1. Cerebral amyloid angiopathy (CODE) -cause of Ashley [name] dementia remains Ashley case  2. Hyperparathyroidism, primary ([place]) -has h/o kidney stones, not on current med to lower calcium and pth -monitor for clinical signs of hypercalcemia  3. Chronic dementia, with behavioral disturbance ([place]) -progressing with decreasing ability to ambulate, speak and more adl dependence, cont memory care support -struggles with sleep patterns and temazepam and trazodone have helped, has zyprexa for psychosis (hallucinations and delusions) and ativan for anxiety daily -Ashley [name] would not tolerate a bone density study or Ashley meds necessary for treatment at this stage of Ashley [name] disease  4. Insomnia due to mental disorder -has been stable on Ashley [name] temazepam longstanding since prior to admission here  5. PBA (pseudobulbar affect) -remains an issue, but cost of nuedexta was prohibitive   6. Expressive aphasia -has progressively worsened over time as expected with Ashley [name] illness, cont memory care support  Family/ staff Communication: discussed with memory care nursing  Labs/tests ordered:  No new  Braelynn Benning L. Mena Simonis, D.O. [facility] [address], [place] [address] Cell Phone (Mon-Fri 8am-5pm):  [phone] On Call:  [phone] & follow prompts after 5pm &  weekends Office Phone:  [phone] Office Fax:  [phone]

## 2018-11-25 ENCOUNTER — Other Ambulatory Visit: Payer: Self-pay | Admitting: Internal Medicine

## 2018-11-25 DIAGNOSIS — F0391 Unspecified dementia with behavioral disturbance: Secondary | ICD-10-CM

## 2018-11-25 DIAGNOSIS — F03918 Unspecified dementia, unspecified severity, with other behavioral disturbance: Secondary | ICD-10-CM

## 2018-11-25 MED ORDER — LORAZEPAM 0.5 MG PO TABS: 0.2500 mg | ORAL_TABLET | Freq: Every day | ORAL | 5 refills | Status: DC

## 2018-12-09 ENCOUNTER — Other Ambulatory Visit: Payer: Self-pay | Admitting: *Deleted

## 2018-12-09 DIAGNOSIS — F5105 Insomnia due to other mental disorder: Secondary | ICD-10-CM

## 2018-12-09 MED ORDER — TEMAZEPAM 7.5 MG PO CAPS: 7.5000 mg | ORAL_CAPSULE | Freq: Every day | ORAL | 5 refills | Status: DC

## 2018-12-09 NOTE — Telephone Encounter (Signed)
Per pharmacy recommendation and notes from DON, we will attempt a dose reduction of the temazepam as pt spends much of her time sleeping now anyway.

## 2018-12-09 NOTE — Telephone Encounter (Signed)
Per tabatha temazepam was decreased to 7.5mg ?

## 2019-01-01 ENCOUNTER — Non-Acute Institutional Stay: Payer: Medicare Other | Admitting: Adult Health

## 2019-01-01 ENCOUNTER — Encounter: Payer: Self-pay | Admitting: Adult Health

## 2019-01-01 ENCOUNTER — Other Ambulatory Visit: Payer: Self-pay

## 2019-01-01 DIAGNOSIS — G459 Transient cerebral ischemic attack, unspecified: Secondary | ICD-10-CM

## 2019-01-01 DIAGNOSIS — I680 Cerebral amyloid angiopathy: Secondary | ICD-10-CM

## 2019-01-01 DIAGNOSIS — F0391 Unspecified dementia with behavioral disturbance: Secondary | ICD-10-CM | POA: Diagnosis not present

## 2019-01-01 DIAGNOSIS — I10 Essential (primary) hypertension: Secondary | ICD-10-CM

## 2019-01-01 DIAGNOSIS — F5105 Insomnia due to other mental disorder: Secondary | ICD-10-CM

## 2019-01-01 DIAGNOSIS — E210 Primary hyperparathyroidism: Secondary | ICD-10-CM

## 2019-01-01 DIAGNOSIS — F03918 Unspecified dementia, unspecified severity, with other behavioral disturbance: Secondary | ICD-10-CM

## 2019-01-01 NOTE — Progress Notes (Signed)
Location:  Occupational psychologist of Service:  ALF (13) Provider:   Cindi Carbon, ANP [place] [phone]  Gayland Curry, DO  Patient Care Team: Gayland Curry, DO as PCP - General (Geriatric Medicine) Irine Seal, MD as Attending Physician (Urology) Jodi Marble, MD as Consulting Physician (Otolaryngology) Martinique, Amy, MD as Consulting Physician (Dermatology) Gerda Diss, DO as Consulting Physician (Family Medicine) Deneise Lever, MD as Consulting Physician (Pulmonary Disease) Arta Silence, MD as Consulting Physician (Gastroenterology) Dohmeier, Asencion Partridge, MD as Consulting Physician (Neurology) Geanie Kenning, MD as Consulting Physician (Psychiatry)  Extended Emergency Contact Information Primary Emergency Contact: [name] Address: [address]          [place], [place] [address] [name] of [place] Phone: [phone] Mobile Phone: [phone] Relation: Spouse  Code Status:  DNR Goals of care: Advanced Directive information Advanced Directives 11/10/2018  Does Patient Have a Medical Advance Directive? Yes  Type of Paramedic of [place];Out of facility DNR (pink MOST or yellow form)  Does patient want to make changes to medical advance directive? No - Patient declined  Copy of [place] in Chart? Yes - validated most recent copy scanned in chart (See row information)  Would patient like information on creating a medical advance directive? -  Pre-existing out of facility DNR order (yellow form or pink MOST form) Yellow form placed in chart (order not valid for inpatient use)     Chief Complaint  Patient presents with  . Medical Management of Chronic Issues    HPI:  Pt is a 78 y.o. female seen today for medical management of chronic diseases.   Insomnia:  Temazepam tapered in march, no reports of difficulty sleeping  Dementia/cerebral amyloid angiopathy: Has  periods of restlessness but is easily redirected such as attempting to sit in the chair sideways. She also has occasional tearful periods that appear unprovoked. She remains ambulatory but has an unsteady gait at times. She is not using a walker as she would not know how to use it and would forget it. She has had several falls and is off aspirin. Most recent fall was 4/15 but no injury was obtained. Last month she had a fall which resulted in significant bruising to her face.  Due to her gait/and progressing dementia there have been several GDR to include zyprexa and ativan. She seems to have tolerated this well.   Hyperparathyroidism: Ionized ca 5.5 11/17/17, PTH 96.86 VIt D 40   BP controlled 141/72  Functional status: ambulatory with a walker, incontinent  Past Medical History:  Diagnosis Date  . Arthritis    back and neck  . Carotid artery stenosis   . Cerebral amyloid angiopathy (CODE) 06/20/2016  . Chronic rhinosinusitis   . DDD (degenerative disc disease)   . Depression   . Diverticulosis of colon   . Frequency of urination   . Glaucoma   . Headache disorder 12/13/2014   Right temple  . Hearing loss   . History of kidney stones    MULTIBLE SURGICAL INTERVENTIONS  . History of skin cancer HX TOPICAL FACIAL [place] 2013  . History of TIA (transient ischemic attack)    NOV 2013-- NO RESIDUAL  . Hypertension   . IBS (irritable bowel syndrome)   . Memory difficulty 05/14/2016  . Memory loss   . Mild asthma    COLD INDUCED  . Right ureteral stone   . Seasonal and perennial allergic  rhinitis   . Shingles    EYEBALL 09-17-2013  . Spondylosis    Past Surgical History:  Procedure Laterality Date  . BUNIONECTOMY     BILATERAL  . CALDWELL LUC     sinus drainage procedure  . CYSTO/ BILATERAL URETEROSCOPIC STONE EXTRACTIONS  02-09-2003  . CYSTO/ BILATERAL URETEROSCOPY/  LASER OF STONES LEFT SIDE  06-02-2007  . CYSTOSCOPY/RETROGRADE/URETEROSCOPY/STONE EXTRACTION WITH BASKET   03/31/2012   Procedure: CYSTOSCOPY/RETROGRADE/URETEROSCOPY/STONE EXTRACTION WITH BASKET;  Surgeon: Malka So, MD;  Location: [facility];  Service: Urology;  Laterality: Right;   . CYSTOSCOPY/RETROGRADE/URETEROSCOPY/STONE EXTRACTION WITH BASKET Right 03/29/2013   Procedure: CYSTOSCOPY//URETEROSCOPY/STONE EXTRACTION WITH BASKET;  Surgeon: Malka So, MD;  Location: [facility];  Service: Urology;  Laterality: Right;  . CYSTOSCOPY/RETROGRADE/URETEROSCOPY/STONE EXTRACTION WITH BASKET Right 10/14/2013   Procedure: CYSTOSCOPY/RETROGRADE/URETEROSCOPY/STONE EXTRACTION WITH BASKET;  Surgeon: Irine Seal, MD;  Location: [facility];  Service: Urology;  Laterality: Right;  . ESOPHAGOGASTRODUODENOSCOPY (EGD) WITH PROPOFOL N/A 04/12/2015   Procedure: ESOPHAGOGASTRODUODENOSCOPY (EGD) WITH PROPOFOL;  Surgeon: Arta Silence, MD;  Location: [facility];  Service: Endoscopy;  Laterality: N/A;  . EUS N/A 04/12/2015   Procedure: ESOPHAGEAL ENDOSCOPIC ULTRASOUND (EUS) RADIAL;  Surgeon: Arta Silence, MD;  Location: [facility];  Service: Endoscopy;  Laterality: N/A;  . EXTRACORPOREAL SHOCK WAVE LITHOTRIPSY  about 7510/ Dr Reece Agar   Never completed procedure-could not tolerate pain. Had seizures due to pain  . EYE SURGERY  AGE 67   INJURY  . HERNIA REPAIR  AGE 6  . HOLMIUM LASER APPLICATION Right 2/58/5277   Procedure: HOLMIUM LASER APPLICATION;  Surgeon: Irine Seal, MD;  Location: [facility];  Service: Urology;  Laterality: Right;  . LAPAROSCOPIC CHOLECYSTECTOMY  01-12-2005  . LEFT URETEROSCOPIC STONE EXTRACTION    LAST ONE 04-28-2009   SEVERAL SURGICAL STONE EXTRACTIONS  . LUMBAR LAMINECTOMY  09-30-2002   L4 - 5;  SPONDYLOLISTHISES W/ STENOSIS AND RADICULOPATHY  . PERCUTANEOUS NEPHROSTOLITHOTOMY  1975  . RIGHT URETEROSCOPIC STONE EXTRACTION  LAST ONE 02-21-2011   MULTIPLE SURGICAL STONE EXTRACTIONS  . TONSILECTOMY, ADENOIDECTOMY, BILATERAL MYRINGOTOMY AND TUBES   CHILD  . VAGINAL HYSTERECTOMY  1991   W/ BILATERAL SALPINGOOPHECTOMY    Allergies  Allergen Reactions  . Donepezil Hcl   . Ace Inhibitors Cough  . Hctz [Hydrochlorothiazide]     Bladder problems  . Triptans     Avoid per neuro    Outpatient Encounter Medications as of 01/01/2019  Medication Sig  . aliskiren (TEKTURNA) 150 MG tablet Take 0.5 tablets (75 mg total) by mouth daily.  . Cranberry 400 MG CAPS Take 400 mg by mouth daily.  . DULoxetine (CYMBALTA) 60 MG capsule Take 60 mg by mouth daily.   Marland Kitchen estradiol (ESTRACE) 1 MG tablet Take 1 mg by mouth every other day.   Marland Kitchen LORazepam (ATIVAN) 0.5 MG tablet Take 0.5 tablets (0.25 mg total) by mouth daily.  Marland Kitchen OLANZapine (ZYPREXA) 10 MG tablet Take 5 mg by mouth at bedtime.   . temazepam (RESTORIL) 7.5 MG capsule Take 1 capsule (7.5 mg total) by mouth at bedtime.  . traZODone (DESYREL) 150 MG tablet Take 150 mg by mouth at bedtime.   . [DISCONTINUED] aspirin EC 81 MG tablet Take 81 mg by mouth daily.  . [DISCONTINUED] Cranberry 500 MG TABS Take 1 tablet by mouth daily.  . [DISCONTINUED] Multiple Vitamins-Minerals (MULTIVITAMINS THER. W/MINERALS) TABS tablet Take 1 tablet daily by mouth.   No facility-administered encounter medications on file as of 01/01/2019.  Review of Systems  Unable to perform ROS: Dementia    Immunization History  Administered Date(s) Administered  . Hepatitis A 11/15/2003, 06/11/2004, 12/25/2012  . Hepatitis B 11/14/2008, 12/15/2008  . Influenza Split 06/17/2011  . Influenza Whole 06/16/2010  . Influenza, High Dose Seasonal PF 05/27/2016  . Influenza,inj,Quad PF,6+ Mos 07/07/2018  . Influenza-Unspecified 07/10/2017  . Pneumococcal Conjugate-13 08/09/2014  . Pneumococcal Polysaccharide-23 04/22/2011, 11/15/2011  . Tdap 11/14/2008, 12/25/2012  . Typhoid Inactivated 12/25/2012  . Yellow Fever 12/25/2012  . Zoster 05/07/2012  . Zoster Recombinat (Shingrix) 01/03/2017, 04/04/2017   Pertinent  Health  Maintenance Due  Topic Date Due  . INFLUENZA VACCINE  04/17/2019  . PNA vac Low Risk Adult  Completed  . COLONOSCOPY  Discontinued   Fall Risk  11/17/2018 01/21/2018 10/29/2016  Falls in the past year? 1 Yes Yes  Number falls in past yr: 1 2 or more 2 or more  Injury with Fall? 0 No Yes  Risk for fall due to : History of fall(s);Impaired mobility;Medication side effect;Impaired balance/gait;Mental status change - -  Follow up Falls evaluation completed;Education provided;Falls prevention discussed - Falls prevention discussed  Comment with staff at [facility]:    Vitals:   01/01/19 1002  BP: (!) 141/72  Pulse: 75  Resp: 16  Temp: (!) 96.8 F (36 C)  SpO2: 94%  Weight: 158 lb 9.6 oz (71.9 kg)   Body mass index is 29.01 kg/m. Physical Exam Vitals signs and nursing note reviewed.  Constitutional:      General: She is not in acute distress.    Appearance: She is not diaphoretic.     Comments: overweight  HENT:     Head: Normocephalic and atraumatic.  Neck:     Musculoskeletal: No neck rigidity or muscular tenderness.     Vascular: No carotid bruit or JVD.  Cardiovascular:     Rate and Rhythm: Normal rate and regular rhythm.     Heart sounds: No murmur.  Pulmonary:     Effort: Pulmonary effort is normal. No respiratory distress.     Breath sounds: Normal breath sounds. No wheezing.  Abdominal:     General: Abdomen is flat. Bowel sounds are normal.     Palpations: Abdomen is soft.  Musculoskeletal:     Right lower leg: No edema.     Left lower leg: No edema.  Lymphadenopathy:     Cervical: No cervical adenopathy.  Skin:    General: Skin is warm and dry.  Neurological:     General: No focal deficit present.     Mental Status: She is alert. Mental status is at baseline.     Comments: MAE, difficulty following commands. Oriented to self only.  Psychiatric:     Comments: Flat affect     Labs reviewed: No results for input(s): NA, K,  CL, CO2, GLUCOSE, BUN, CREATININE, CALCIUM, MG, PHOS in the last 8760 hours. No results for input(s): AST, ALT, ALKPHOS, BILITOT, PROT, ALBUMIN in the last 8760 hours. No results for input(s): WBC, NEUTROABS, HGB, HCT, MCV, PLT in the last 8760 hours. Lab Results  Component Value Date   TSH 1.62 01/05/2018   Lab Results  Component Value Date   HGBA1C 5.4 11/14/2017   Lab Results  Component Value Date   CHOL 183 11/14/2017   HDL 64 11/14/2017   LDLCALC 102 11/14/2017   TRIG 86 11/14/2017   CHOLHDL 2.9 07/15/2016    Significant Diagnostic Results in last  30 days:  No results found.  Assessment/Plan 1. Cerebral amyloid angiopathy (CODE) Progressive decline in cognition and function Continue supportive care in the memory unit.   2. Chronic dementia, with behavioral disturbance ([place]) Due to periods of crying will not taper Zyprex and Ativan further at this time.   3. Hyperparathyroidism, primary ([place]) Needs annual Ionized Ca PTH and Vit D when   4. Essential hypertension Controlled  Continue tekturna, avoid aggressive bp management due to falls   5. TIA (transient ischemic attack) Hx of this no longer on aspirin due to falls with head hematoma and goals of care.   6. Insomnia due to mental disorder Continue Trazodone 150 mg qhs     Family/ staff Communication: staff  Labs/tests ordered:  Labs due when covid restrictions lifted.

## 2019-01-09 ENCOUNTER — Other Ambulatory Visit: Payer: Self-pay | Admitting: Internal Medicine

## 2019-02-01 DIAGNOSIS — E210 Primary hyperparathyroidism: Secondary | ICD-10-CM | POA: Diagnosis not present

## 2019-02-01 DIAGNOSIS — E039 Hypothyroidism, unspecified: Secondary | ICD-10-CM | POA: Diagnosis not present

## 2019-02-01 LAB — TSH: TSH: 1.93 (ref <=5.90)

## 2019-02-18 ENCOUNTER — Encounter: Payer: Self-pay | Admitting: Adult Health

## 2019-02-18 ENCOUNTER — Non-Acute Institutional Stay: Payer: Medicare Other | Admitting: Adult Health

## 2019-02-18 DIAGNOSIS — I10 Essential (primary) hypertension: Secondary | ICD-10-CM

## 2019-02-18 DIAGNOSIS — F5105 Insomnia due to other mental disorder: Secondary | ICD-10-CM

## 2019-02-18 DIAGNOSIS — I680 Cerebral amyloid angiopathy: Secondary | ICD-10-CM | POA: Diagnosis not present

## 2019-02-18 DIAGNOSIS — F482 Pseudobulbar affect: Secondary | ICD-10-CM

## 2019-02-18 DIAGNOSIS — F03918 Unspecified dementia, unspecified severity, with other behavioral disturbance: Secondary | ICD-10-CM

## 2019-02-18 DIAGNOSIS — E210 Primary hyperparathyroidism: Secondary | ICD-10-CM

## 2019-02-18 DIAGNOSIS — F0391 Unspecified dementia with behavioral disturbance: Secondary | ICD-10-CM

## 2019-02-18 NOTE — Progress Notes (Signed)
Location:  Occupational psychologist of Service:  ALF (13) Provider:   Cindi Carbon, ANP [place] [phone]   Gayland Curry, DO  Patient Care Team: Gayland Curry, DO as PCP - General (Geriatric Medicine) Irine Seal, MD as Attending Physician (Urology) Jodi Marble, MD as Consulting Physician (Otolaryngology) Martinique, Amy, MD as Consulting Physician (Dermatology) Gerda Diss, DO as Consulting Physician (Family Medicine) Deneise Lever, MD as Consulting Physician (Pulmonary Disease) Arta Silence, MD as Consulting Physician (Gastroenterology) Dohmeier, Asencion Partridge, MD as Consulting Physician (Neurology) Geanie Kenning, MD as Consulting Physician (Psychiatry)  Extended Emergency Contact Information Primary Emergency Contact: [name] Address: [address]          [place], [place] [address] [name] of [place] Phone: [phone] Mobile Phone: [phone] Relation: Spouse  Code Status:  DNR Goals of care: Advanced Directive information Advanced Directives 11/10/2018  Does Patient Have a Medical Advance Directive? Yes  Type of Paramedic of [place];Out of facility DNR (pink MOST or yellow form)  Does patient want to make changes to medical advance directive? No - Patient declined  Copy of [place] in Chart? Yes - validated most recent copy scanned in chart (See row information)  Would patient like information on creating a medical advance directive? -  Pre-existing out of facility DNR order (yellow form or pink MOST form) Yellow form placed in chart (order not valid for inpatient use)     Chief Complaint  Patient presents with  . Medical Management of Chronic Issues    HPI:  Pt is a 78 y.o. female seen today for medical management of chronic diseases.   She resides in the memory care unit due to progressive dementia associated with cerebral amyloid angiopathy. She  remains ambulatory but is walking less and has gained weight over time. Staff report she has unprovoked periods of crying but can be distracted or redirected. She is in contact with her husband daily by virtual visits and this seems to help. There are no reports of difficulty sleeping after tapering of temazepam recently. No issues with appetite or bowels.  BP has been variable 120-160's.     Past Medical History:  Diagnosis Date  . Arthritis    back and neck  . Carotid artery stenosis   . Cerebral amyloid angiopathy (CODE) 06/20/2016  . Chronic rhinosinusitis   . DDD (degenerative disc disease)   . Depression   . Diverticulosis of colon   . Frequency of urination   . Glaucoma   . Headache disorder 12/13/2014   Right temple  . Hearing loss   . History of kidney stones    MULTIBLE SURGICAL INTERVENTIONS  . History of skin cancer HX TOPICAL FACIAL [place] 2013  . History of TIA (transient ischemic attack)    NOV 2013-- NO RESIDUAL  . Hypertension   . IBS (irritable bowel syndrome)   . Memory difficulty 05/14/2016  . Memory loss   . Mild asthma    COLD INDUCED  . Right ureteral stone   . Seasonal and perennial allergic rhinitis   . Shingles    EYEBALL 09-17-2013  . Spondylosis    Past Surgical History:  Procedure Laterality Date  . BUNIONECTOMY     BILATERAL  . CALDWELL LUC     sinus drainage procedure  . CYSTO/ BILATERAL URETEROSCOPIC STONE EXTRACTIONS  02-09-2003  . CYSTO/ BILATERAL URETEROSCOPY/  LASER OF STONES LEFT SIDE  06-02-2007  .  CYSTOSCOPY/RETROGRADE/URETEROSCOPY/STONE EXTRACTION WITH BASKET  03/31/2012   Procedure: CYSTOSCOPY/RETROGRADE/URETEROSCOPY/STONE EXTRACTION WITH BASKET;  Surgeon: Malka So, MD;  Location: [facility];  Service: Urology;  Laterality: Right;   . CYSTOSCOPY/RETROGRADE/URETEROSCOPY/STONE EXTRACTION WITH BASKET Right 03/29/2013   Procedure: CYSTOSCOPY//URETEROSCOPY/STONE EXTRACTION WITH BASKET;  Surgeon: Malka So, MD;   [facility];  Service: Urology;  Laterality: Right;  . CYSTOSCOPY/RETROGRADE/URETEROSCOPY/STONE EXTRACTION WITH BASKET Right 10/14/2013   Procedure: CYSTOSCOPY/RETROGRADE/URETEROSCOPY/STONE EXTRACTION WITH BASKET;  Surgeon: Irine Seal, MD;  Location: [facility];  Service: Urology;  Laterality: Right;  . ESOPHAGOGASTRODUODENOSCOPY (EGD) WITH PROPOFOL N/A 04/12/2015   Procedure: ESOPHAGOGASTRODUODENOSCOPY (EGD) WITH PROPOFOL;  Surgeon: Arta Silence, MD;  [facility];  Service: Endoscopy;  Laterality: N/A;  . EUS N/A 04/12/2015   Procedure: ESOPHAGEAL ENDOSCOPIC ULTRASOUND (EUS) RADIAL;  Surgeon: Arta Silence, MD;  Location[facility];  Service: Endoscopy;  Laterality: N/A;  . EXTRACORPOREAL SHOCK WAVE LITHOTRIPSY  about 2585/ Dr Reece Agar   Never completed procedure-could not tolerate pain. Had seizures due to pain  . EYE SURGERY  AGE 72   INJURY  . HERNIA REPAIR  AGE 30  . HOLMIUM LASER APPLICATION Right 2/77/8242   Procedure: HOLMIUM LASER APPLICATION;  Surgeon: Irine Seal, MD;  Location: [facility];  Service: Urology;  Laterality: Right;  . LAPAROSCOPIC CHOLECYSTECTOMY  01-12-2005  . LEFT URETEROSCOPIC STONE EXTRACTION    LAST ONE 04-28-2009   SEVERAL SURGICAL STONE EXTRACTIONS  . LUMBAR LAMINECTOMY  09-30-2002   L4 - 5;  SPONDYLOLISTHISES W/ STENOSIS AND RADICULOPATHY  . PERCUTANEOUS NEPHROSTOLITHOTOMY  1975  . RIGHT URETEROSCOPIC STONE EXTRACTION  LAST ONE 02-21-2011   MULTIPLE SURGICAL STONE EXTRACTIONS  . TONSILECTOMY, ADENOIDECTOMY, BILATERAL MYRINGOTOMY AND TUBES  CHILD  . VAGINAL HYSTERECTOMY  1991   W/ BILATERAL SALPINGOOPHECTOMY    Allergies  Allergen Reactions  . Donepezil Hcl   . Ace Inhibitors Cough  . Hctz [Hydrochlorothiazide]     Bladder problems  . Triptans     Avoid per neuro    Outpatient Encounter Medications as of 02/18/2019  Medication Sig  . aliskiren (TEKTURNA) 150 MG tablet Take 0.5 tablets (75 mg total)  by mouth daily.  . Cranberry 400 MG CAPS Take 400 mg by mouth daily.  . DULoxetine (CYMBALTA) 60 MG capsule TAKE 1 CAPSULE BY MOUTH ONCE DAILY **DO NOT CRUSH*  . estradiol (ESTRACE) 1 MG tablet Take 1 mg by mouth every other day.   Marland Kitchen LORazepam (ATIVAN) 0.5 MG tablet Take 0.5 tablets (0.25 mg total) by mouth daily.  Marland Kitchen OLANZapine (ZYPREXA) 10 MG tablet Take 5 mg by mouth at bedtime.   . traZODone (DESYREL) 150 MG tablet Take 150 mg by mouth at bedtime.   . [DISCONTINUED] temazepam (RESTORIL) 7.5 MG capsule Take 1 capsule (7.5 mg total) by mouth at bedtime.   No facility-administered encounter medications on file as of 02/18/2019.     Review of Systems  Unable to perform ROS: Dementia    Immunization History  Administered Date(s) Administered  . Hepatitis A 11/15/2003, 06/11/2004, 12/25/2012  . Hepatitis B 11/14/2008, 12/15/2008  . Influenza Split 06/17/2011  . Influenza Whole 06/16/2010  . Influenza, High Dose Seasonal PF 05/27/2016  . Influenza,inj,Quad PF,6+ Mos 07/07/2018  . Influenza-Unspecified 07/10/2017  . Pneumococcal Conjugate-13 08/09/2014  . Pneumococcal Polysaccharide-23 04/22/2011, 11/15/2011  . Tdap 11/14/2008, 12/25/2012  . Typhoid Inactivated 12/25/2012  . Yellow Fever 12/25/2012  . Zoster 05/07/2012  . Zoster Recombinat (Shingrix) 01/03/2017, 04/04/2017   Pertinent  Health  Maintenance Due  Topic Date Due  . INFLUENZA VACCINE  04/17/2019  . PNA vac Low Risk Adult  Completed   Fall Risk  11/17/2018 01/21/2018 10/29/2016  Falls in the past year? 1 Yes Yes  Number falls in past yr: 1 2 or more 2 or more  Injury with Fall? 0 No Yes  Risk for fall due to : History of fall(s);Impaired mobility;Medication side effect;Impaired balance/gait;Mental status change - -  Follow up Falls evaluation completed;Education provided;Falls prevention discussed - Falls prevention discussed  Comment with staff at [facility]:    Vitals:   02/18/19 1612   Weight: 159 lb 12.8 oz (72.5 kg)   Body mass index is 29.23 kg/m. Physical Exam Vitals signs and nursing note reviewed.  Constitutional:      General: She is not in acute distress.    Appearance: She is not diaphoretic.  HENT:     Head: Normocephalic and atraumatic.  Neck:     Vascular: No JVD.  Cardiovascular:     Rate and Rhythm: Normal rate and regular rhythm.     Heart sounds: No murmur.  Pulmonary:     Effort: Pulmonary effort is normal. No respiratory distress.     Breath sounds: Normal breath sounds. No wheezing.  Abdominal:     General: Abdomen is flat. Bowel sounds are normal.     Palpations: Abdomen is soft.  Skin:    General: Skin is warm and dry.  Neurological:     General: No focal deficit present.     Mental Status: She is alert. Mental status is at baseline.     Comments: Verbal but not able to answer questions. MAE  Psychiatric:     Comments: crying     Labs reviewed: No results for input(s): NA, K, CL, CO2, GLUCOSE, BUN, CREATININE, CALCIUM, MG, PHOS in the last 8760 hours. No results for input(s): AST, ALT, ALKPHOS, BILITOT, PROT, ALBUMIN in the last 8760 hours. No results for input(s): WBC, NEUTROABS, HGB, HCT, MCV, PLT in the last 8760 hours. Lab Results  Component Value Date   TSH 1.93 02/01/2019   Lab Results  Component Value Date   HGBA1C 5.4 11/14/2017   Lab Results  Component Value Date   CHOL 183 11/14/2017   HDL 64 11/14/2017   LDLCALC 102 11/14/2017   TRIG 86 11/14/2017   CHOLHDL 2.9 07/15/2016    Significant Diagnostic Results in last 30 days:  No results found.  Assessment/Plan 1. Cerebral amyloid angiopathy (CODE) Led to progressive dementia with behaviors Resides in memory care.   2. Essential hypertension BP is quite variable which may correlate with her mood. Given her fall risk would avoid aggressive bp treatment. Continue tekturna 75 mg qd  3. Hyperparathyroidism, primary ([place]) Monitor Ionized Ca, Vit D, and intact  PTH annually  4. Chronic dementia, with behavioral disturbance ([place]) Progressive decline in cognition and physical function c/w the disease. Continue supportive care in the skilled environment.  5. PBA (pseudobulbar affect) Has periods of crying but not worse from baseline. Can be re directed. Would not add or change meds.   6. Insomnia due to mental disorder No change in sleep habits per nsg notes with tapering of temazepam. Continue trazodone 150 mg qhs    Family/ staff Communication: staff   Labs/tests ordered:  CBC BMP Ionized Calcium, Vit D, intact PTH, A1C

## 2019-02-27 DIAGNOSIS — Z20828 Contact with and (suspected) exposure to other viral communicable diseases: Secondary | ICD-10-CM | POA: Diagnosis not present

## 2019-03-01 DIAGNOSIS — E559 Vitamin D deficiency, unspecified: Secondary | ICD-10-CM | POA: Diagnosis not present

## 2019-03-01 DIAGNOSIS — E119 Type 2 diabetes mellitus without complications: Secondary | ICD-10-CM | POA: Diagnosis not present

## 2019-03-01 DIAGNOSIS — D649 Anemia, unspecified: Secondary | ICD-10-CM | POA: Diagnosis not present

## 2019-03-01 DIAGNOSIS — E210 Primary hyperparathyroidism: Secondary | ICD-10-CM | POA: Diagnosis not present

## 2019-03-01 DIAGNOSIS — R5383 Other fatigue: Secondary | ICD-10-CM | POA: Diagnosis not present

## 2019-03-01 DIAGNOSIS — I10 Essential (primary) hypertension: Secondary | ICD-10-CM | POA: Diagnosis not present

## 2019-04-12 DIAGNOSIS — L84 Corns and callosities: Secondary | ICD-10-CM | POA: Diagnosis not present

## 2019-04-12 DIAGNOSIS — L603 Nail dystrophy: Secondary | ICD-10-CM | POA: Diagnosis not present

## 2019-04-27 ENCOUNTER — Encounter: Payer: Self-pay | Admitting: Internal Medicine

## 2019-04-27 ENCOUNTER — Non-Acute Institutional Stay: Payer: Medicare Other | Admitting: Internal Medicine

## 2019-04-27 DIAGNOSIS — I680 Cerebral amyloid angiopathy: Secondary | ICD-10-CM

## 2019-04-27 DIAGNOSIS — F482 Pseudobulbar affect: Secondary | ICD-10-CM | POA: Diagnosis not present

## 2019-04-27 DIAGNOSIS — I10 Essential (primary) hypertension: Secondary | ICD-10-CM

## 2019-04-27 DIAGNOSIS — F0391 Unspecified dementia with behavioral disturbance: Secondary | ICD-10-CM | POA: Diagnosis not present

## 2019-04-27 DIAGNOSIS — E210 Primary hyperparathyroidism: Secondary | ICD-10-CM

## 2019-04-27 DIAGNOSIS — F03918 Unspecified dementia, unspecified severity, with other behavioral disturbance: Secondary | ICD-10-CM

## 2019-04-27 DIAGNOSIS — F5105 Insomnia due to other mental disorder: Secondary | ICD-10-CM | POA: Diagnosis not present

## 2019-04-27 NOTE — Progress Notes (Signed)
Patient ID: Ashley [name], female   DOB: Dec 08, 1940, 78 y.o.   MRN: [id number]  Location:  [place] Room Number: [place] of Service:  ALF [phone]) Provider:   Gayland Curry, DO  Patient Care Team: Ashley Curry, DO as PCP - General (Geriatric Medicine) Ashley Seal, MD as Attending Physician (Urology) Ashley Marble, MD as Consulting Physician (Otolaryngology) Martinique, Amy, MD as Consulting Physician (Dermatology) Ashley Diss, DO as Consulting Physician (Family Medicine) Ashley Lever, MD as Consulting Physician (Pulmonary Disease) Ashley Silence, MD as Consulting Physician (Gastroenterology) Dohmeier, Asencion Partridge, MD as Consulting Physician (Neurology) Ashley Kenning, MD as Consulting Physician (Psychiatry)  Extended Emergency Contact Information Primary Emergency Contact: Ashley [name] Address: [address]          [place], [place] [address] [name] of [place] Phone: [phone] Mobile Phone: [phone] Relation: Spouse  Code Status:  DNR, MOST Goals of care: Advanced Directive information Advanced Directives 11/10/2018  Does Patient Have a Medical Advance Directive? Yes  Type of Paramedic of [place];Out of facility DNR (pink MOST or yellow form)  Does patient want to make changes to medical advance directive? No - Patient declined  Copy of [place] in Chart? Yes - validated most recent copy scanned in chart (See row information)  Would patient like information on creating a medical advance directive? -  Pre-existing out of facility DNR order (yellow form or pink MOST form) Yellow form placed in chart (order not valid for inpatient use)     Chief Complaint  Patient presents with  . Medical Management of Chronic Issues    Routine Visit    HPI:  Pt is a 78 y.o. 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  Ashley [name] has been the same here lately.  She spends most of the day seated in one of the chairs in the common area.  In fact, a few times, she was seated in a different chair, she got up and with a very unsteady gait, walked to that very chair per the nurse.  She denies pain.  She does eat ok. Weight is stable.  Her husband is very involved in her care and visits as often as we allow.    Past Medical History:  Diagnosis Date  . Arthritis    back and neck  . Carotid artery stenosis   . Cerebral amyloid angiopathy (CODE) 06/20/2016  . Chronic rhinosinusitis   . DDD (degenerative disc disease)   . Depression   . Diverticulosis of colon   . Frequency of urination   . Glaucoma   . Headache disorder 12/13/2014   Right temple  . Hearing loss   . History of kidney stones    MULTIBLE SURGICAL INTERVENTIONS  . History of skin cancer HX TOPICAL FACIAL [place] 2013  . History of TIA (transient ischemic attack)    NOV 2013-- NO RESIDUAL  . Hypertension   . IBS (irritable bowel syndrome)   . Memory difficulty 05/14/2016  . Memory loss   . Mild asthma    COLD INDUCED  . Right ureteral stone   . Seasonal and perennial allergic rhinitis   . Shingles    EYEBALL 09-17-2013  . Spondylosis    Past Surgical History:  Procedure Laterality Date  . BUNIONECTOMY  BILATERAL  . CALDWELL LUC     sinus drainage procedure  . CYSTO/ BILATERAL URETEROSCOPIC STONE EXTRACTIONS  02-09-2003  . CYSTO/ BILATERAL URETEROSCOPY/  LASER OF STONES LEFT SIDE  06-02-2007  . CYSTOSCOPY/RETROGRADE/URETEROSCOPY/STONE EXTRACTION WITH BASKET  03/31/2012   Procedure: CYSTOSCOPY/RETROGRADE/URETEROSCOPY/STONE EXTRACTION WITH BASKET;  Surgeon: Malka So, MD;  Location: [facility];  Service: Urology;  Laterality: Right;   .  CYSTOSCOPY/RETROGRADE/URETEROSCOPY/STONE EXTRACTION WITH BASKET Right 03/29/2013   Procedure: CYSTOSCOPY//URETEROSCOPY/STONE EXTRACTION WITH BASKET;  Surgeon: Malka So, MD;  Location: [facility];  Service: Urology;  Laterality: Right;  . CYSTOSCOPY/RETROGRADE/URETEROSCOPY/STONE EXTRACTION WITH BASKET Right 10/14/2013   Procedure: CYSTOSCOPY/RETROGRADE/URETEROSCOPY/STONE EXTRACTION WITH BASKET;  Surgeon: Ashley Seal, MD;  Location: [facility];  Service: Urology;  Laterality: Right;  . ESOPHAGOGASTRODUODENOSCOPY (EGD) WITH PROPOFOL N/A 04/12/2015   Procedure: ESOPHAGOGASTRODUODENOSCOPY (EGD) WITH PROPOFOL;  Surgeon: Ashley Silence, MD;  Location: [facility];  Service: Endoscopy;  Laterality: N/A;  . EUS N/A 04/12/2015   Procedure: ESOPHAGEAL ENDOSCOPIC ULTRASOUND (EUS) RADIAL;  Surgeon: Ashley Silence, MD;  Location: [facility];  Service: Endoscopy;  Laterality: N/A;  . EXTRACORPOREAL SHOCK WAVE LITHOTRIPSY  about 1696/ Dr Reece Agar   Never completed procedure-could not tolerate pain. Had seizures due to pain  . EYE SURGERY  AGE 80   INJURY  . HERNIA REPAIR  AGE 21  . HOLMIUM LASER APPLICATION Right 7/89/3810   Procedure: HOLMIUM LASER APPLICATION;  Surgeon: Ashley Seal, MD;  Location: [facility];  Service: Urology;  Laterality: Right;  . LAPAROSCOPIC CHOLECYSTECTOMY  01-12-2005  . LEFT URETEROSCOPIC STONE EXTRACTION    LAST ONE 04-28-2009   SEVERAL SURGICAL STONE EXTRACTIONS  . LUMBAR LAMINECTOMY  09-30-2002   L4 - 5;  SPONDYLOLISTHISES W/ STENOSIS AND RADICULOPATHY  . PERCUTANEOUS NEPHROSTOLITHOTOMY  1975  . RIGHT URETEROSCOPIC STONE EXTRACTION  LAST ONE 02-21-2011   MULTIPLE SURGICAL STONE EXTRACTIONS  . TONSILECTOMY, ADENOIDECTOMY, BILATERAL MYRINGOTOMY AND TUBES  CHILD  . VAGINAL HYSTERECTOMY  1991   W/ BILATERAL SALPINGOOPHECTOMY    Allergies  Allergen Reactions  . Donepezil Hcl   . Ace Inhibitors Cough  . Hctz [Hydrochlorothiazide]     Bladder  problems  . Triptans     Avoid per neuro    Outpatient Encounter Medications as of 04/27/2019  Medication Sig  . aliskiren (TEKTURNA) 150 MG tablet Take 0.5 tablets (75 mg total) by mouth daily.  . Cranberry 400 MG CAPS Take 400 mg by mouth daily.  . DULoxetine (CYMBALTA) 60 MG capsule TAKE 1 CAPSULE BY MOUTH ONCE DAILY **DO NOT CRUSH*  . estradiol (ESTRACE) 1 MG tablet Take 1 mg by mouth every other day.   Marland Kitchen LORazepam (ATIVAN) 0.5 MG tablet Take 0.5 tablets (0.25 mg total) by mouth daily.  Marland Kitchen OLANZapine (ZYPREXA) 5 MG tablet Take 1 tablet by mouth daily at 6 PM.  . traZODone (DESYREL) 150 MG tablet Take 150 mg by mouth at bedtime.   . [DISCONTINUED] OLANZapine (ZYPREXA) 10 MG tablet Take 5 mg by mouth at bedtime.    No facility-administered encounter medications on file as of 04/27/2019.     Review of Systems  Constitutional: Negative for activity change, appetite change, chills, fever and unexpected weight change.  HENT: Negative for congestion.   Eyes: Negative for visual disturbance.  Respiratory: Negative for cough and shortness of breath.   Cardiovascular: Negative for chest pain, palpitations and leg swelling.  Genitourinary: Negative for dysuria.  Musculoskeletal: Positive for gait problem.  Skin: Negative for color change.  Neurological: Positive for weakness. Negative for dizziness.       Gait is unsteady  Psychiatric/Behavioral: Positive for confusion and sleep disturbance. The patient is nervous/anxious.     Immunization History  Administered Date(s) Administered  . Hepatitis A 11/15/2003, 06/11/2004, 12/25/2012  . Hepatitis B 11/14/2008, 12/15/2008  . Influenza Split 06/17/2011  . Influenza Whole 06/16/2010  . Influenza, High Dose Seasonal PF 05/27/2016  . Influenza,inj,Quad PF,6+ Mos 07/07/2018  . Influenza-Unspecified 07/10/2017  . Pneumococcal Conjugate-13 08/09/2014  . Pneumococcal Polysaccharide-23 04/22/2011, 11/15/2011  . Tdap 11/14/2008, 12/25/2012  .  Typhoid Inactivated 12/25/2012  . Yellow Fever 12/25/2012  . Zoster 05/07/2012  . Zoster Recombinat (Shingrix) 01/03/2017, 04/04/2017   Pertinent  Health Maintenance Due  Topic Date Due  . INFLUENZA VACCINE  04/17/2019  . PNA vac Low Risk Adult  Completed   Fall Risk  11/17/2018 01/21/2018 10/29/2016  Falls in the past year? 1 Yes Yes  Number falls in past yr: 1 2 or more 2 or more  Injury with Fall? 0 No Yes  Risk for fall due to : History of fall(s);Impaired mobility;Medication side effect;Impaired balance/gait;Mental status change - -  Follow up Falls evaluation completed;Education provided;Falls prevention discussed - Falls prevention discussed  Comment with staff at [facility]:    Vitals:   04/27/19 1144  BP: 136/84  Pulse: 82  Resp: 16  Temp: (!) 97.1 F (36.2 C)  TempSrc: Oral  SpO2: 96%  Weight: 158 lb (71.7 kg)  Height: 5\' 2"  (1.575 m)   Body mass index is 28.9 kg/m. Physical Exam Vitals signs reviewed.  Constitutional:      General: She is not in acute distress.    Appearance: Normal appearance. She is normal weight. She is not toxic-appearing.  HENT:     Head: Normocephalic and atraumatic.  Cardiovascular:     Rate and Rhythm: Normal rate and regular rhythm.     Pulses: Normal pulses.     Heart sounds: Normal heart sounds.  Pulmonary:     Effort: Pulmonary effort is normal.     Breath sounds: Normal breath sounds.  Abdominal:     General: Bowel sounds are normal.     Palpations: Abdomen is soft.     Tenderness: There is no abdominal tenderness.  Musculoskeletal: Normal range of motion.     Right lower leg: No edema.     Left lower leg: No edema.  Skin:    General: Skin is warm and dry.     Capillary Refill: Capillary refill takes less than 2 seconds.  Neurological:     General: No focal deficit present.     Mental Status: She is alert. Mental status is at baseline.     Gait: Gait abnormal.  Psychiatric:      Comments: Flat affect     Labs reviewed: No results for input(s): NA, K, CL, CO2, GLUCOSE, BUN, CREATININE, CALCIUM, MG, PHOS in the last 8760 hours. No results for input(s): AST, ALT, ALKPHOS, BILITOT, PROT, ALBUMIN in the last 8760 hours. No results for input(s): WBC, NEUTROABS, HGB, HCT, MCV, PLT in the last 8760 hours. Lab Results  Component Value Date   TSH 1.93 02/01/2019   Lab Results  Component Value Date   HGBA1C 5.4 11/14/2017   Lab Results  Component Value Date   CHOL 183 11/14/2017   HDL 64 11/14/2017   LDLCALC 102 11/14/2017   TRIG 86 11/14/2017   CHOLHDL 2.9 07/15/2016  Assessment/Plan 1. Cerebral amyloid angiopathy (CODE) -cause of her dementia  2. Chronic dementia, with behavioral disturbance ([place]) -not having behavioral concerns that were reported to me with her current regimen; continues on zyprexa each evening, nightly ativan for anxiety, cymbalta for depression, and trazodone for sleep -? If zyprexa could be reduced to 2.5mg  -no notes in past month indicating any concern with behaviors  3. Essential hypertension -bp at goal w/ tekturna only  4. Hyperparathyroidism, primary ([place]) -not on medication for this at this point; has h/o kidney stones as a consequence of this   5. PBA (pseudobulbar affect) -no longer on nuedexta due to no longer benefiting from this and no longer showing signs of inappropriate laughing or crying   6. Insomnia due to mental disorder -stable, continues on trazodone at hs  Family/ staff Communication: discussed with day memory care nurse  Labs/tests ordered:  No new  [place] Every L. Zyaira Vejar, [name].O. [facility] [address], [place] [address] Cell Phone (Mon-Fri 8am-5pm):  [phone] On Call:  [phone] & follow prompts after 5pm & weekends Office Phone:  [phone] Office Fax:  [phone]

## 2019-05-28 ENCOUNTER — Other Ambulatory Visit: Payer: Self-pay | Admitting: Adult Health

## 2019-05-28 DIAGNOSIS — F0391 Unspecified dementia with behavioral disturbance: Secondary | ICD-10-CM

## 2019-05-28 DIAGNOSIS — F03918 Unspecified dementia, unspecified severity, with other behavioral disturbance: Secondary | ICD-10-CM

## 2019-05-28 MED ORDER — LORAZEPAM 0.5 MG PO TABS: 0.2500 mg | ORAL_TABLET | Freq: Every day | ORAL | 5 refills | Status: DC

## 2019-06-16 DIAGNOSIS — Z20828 Contact with and (suspected) exposure to other viral communicable diseases: Secondary | ICD-10-CM | POA: Diagnosis not present

## 2019-06-17 LAB — NOVEL CORONAVIRUS, NAA: SARS-CoV-2, NAA: NOT DETECTED

## 2019-06-24 DIAGNOSIS — Z20828 Contact with and (suspected) exposure to other viral communicable diseases: Secondary | ICD-10-CM | POA: Diagnosis not present

## 2019-06-30 ENCOUNTER — Encounter: Payer: Self-pay | Admitting: *Deleted

## 2019-06-30 DIAGNOSIS — Z9189 Other specified personal risk factors, not elsewhere classified: Secondary | ICD-10-CM | POA: Diagnosis not present

## 2019-07-01 DIAGNOSIS — Z23 Encounter for immunization: Secondary | ICD-10-CM | POA: Diagnosis not present

## 2019-07-05 DIAGNOSIS — Z9189 Other specified personal risk factors, not elsewhere classified: Secondary | ICD-10-CM | POA: Diagnosis not present

## 2019-07-08 ENCOUNTER — Encounter: Payer: Self-pay | Admitting: Adult Health

## 2019-07-08 ENCOUNTER — Non-Acute Institutional Stay: Payer: Medicare Other | Admitting: Adult Health

## 2019-07-08 DIAGNOSIS — F5105 Insomnia due to other mental disorder: Secondary | ICD-10-CM | POA: Diagnosis not present

## 2019-07-08 DIAGNOSIS — F03918 Unspecified dementia, unspecified severity, with other behavioral disturbance: Secondary | ICD-10-CM

## 2019-07-08 DIAGNOSIS — N200 Calculus of kidney: Secondary | ICD-10-CM

## 2019-07-08 DIAGNOSIS — F0391 Unspecified dementia with behavioral disturbance: Secondary | ICD-10-CM | POA: Diagnosis not present

## 2019-07-08 DIAGNOSIS — I680 Cerebral amyloid angiopathy: Secondary | ICD-10-CM | POA: Diagnosis not present

## 2019-07-08 DIAGNOSIS — M8589 Other specified disorders of bone density and structure, multiple sites: Secondary | ICD-10-CM | POA: Diagnosis not present

## 2019-07-08 DIAGNOSIS — I10 Essential (primary) hypertension: Secondary | ICD-10-CM

## 2019-07-08 MED ORDER — LORAZEPAM 0.5 MG PO TABS: 0.2500 mg | ORAL_TABLET | Freq: Two times a day (BID) | ORAL | 0 refills | Status: AC | PRN

## 2019-07-08 NOTE — Progress Notes (Signed)
Location:  Occupational psychologist of Service:  ALF (13) Provider:   Cindi Carbon, ANP [place] [phone]   Ashley Curry, DO  Patient Care Team: Ashley Curry, DO as PCP - General (Geriatric Medicine) Irine Seal, MD as Attending Physician (Urology) Jodi Marble, MD as Consulting Physician (Otolaryngology) Martinique, Amy, MD as Consulting Physician (Dermatology) Gerda Diss, DO as Consulting Physician (Family Medicine) Deneise Lever, MD as Consulting Physician (Pulmonary Disease) Arta Silence, MD as Consulting Physician (Gastroenterology) Dohmeier, Asencion Partridge, MD as Consulting Physician (Neurology) Geanie Kenning, MD as Consulting Physician (Psychiatry)  Extended Emergency Contact Information Primary Emergency Contact: [name] Address: [address]          [place], [place] [address] [name] of [place] Phone: [phone] Mobile Phone: [phone] Relation: Spouse  Code Status:  DNR Goals of care: Advanced Directive information Advanced Directives 11/10/2018  Does Patient Have a Medical Advance Directive? Yes  Type of Paramedic of [place];Out of facility DNR (pink MOST or yellow form)  Does patient want to make changes to medical advance directive? No - Patient declined  Copy of [place] in Chart? Yes - validated most recent copy scanned in chart (See row information)  Would patient like information on creating a medical advance directive? -  Pre-existing out of facility DNR order (yellow form or pink MOST form) Yellow form placed in chart (order not valid for inpatient use)     Chief Complaint  Patient presents with  . Medical Management of Chronic Issues    HPI:  Pt is a 78 y.o. female seen today for medical management of chronic diseases.   Ms B has a hx of cerebral amyloid angiopathy and resides in the memory care unit. She is walking less and is less  verbal. The nurse reports she is more resistant to personal care and is crying after lunch on a regular basis for the past week or so. She is not able to take an MMSE test and is incontinent. Her goals of care are comfort based with no hospitalizations.  She has no signs of pain. Noted hx of calcium oxylate kidney stones but no issues in the past year. Hx of osteopenia on estrace and Vit d. BP is well controlled. Weight is trending up likely due to being less mobile.  Wt Readings from Last 3 Encounters:  07/08/19 163 lb 3.2 oz (74 kg)  04/27/19 158 lb (71.7 kg)  02/18/19 159 lb 12.8 oz (72.5 kg)      Past Medical History:  Diagnosis Date  . Arthritis    back and neck  . Carotid artery stenosis   . Cerebral amyloid angiopathy (CODE) 06/20/2016  . Chronic rhinosinusitis   . DDD (degenerative disc disease)   . Depression   . Diverticulosis of colon   . Frequency of urination   . Glaucoma   . Headache disorder 12/13/2014   Right temple  . Hearing loss   . History of kidney stones    MULTIBLE SURGICAL INTERVENTIONS  . History of skin cancer HX TOPICAL FACIAL [place] 2013  . History of TIA (transient ischemic attack)    NOV 2013-- NO RESIDUAL  . Hypertension   . IBS (irritable bowel syndrome)   . Memory difficulty 05/14/2016  . Memory loss   . Mild asthma    COLD INDUCED  . Right ureteral stone   . Seasonal and perennial allergic rhinitis   .  Shingles    EYEBALL 09-17-2013  . Spondylosis    Past Surgical History:  Procedure Laterality Date  . BUNIONECTOMY     BILATERAL  . CALDWELL LUC     sinus drainage procedure  . CYSTO/ BILATERAL URETEROSCOPIC STONE EXTRACTIONS  02-09-2003  . CYSTO/ BILATERAL URETEROSCOPY/  LASER OF STONES LEFT SIDE  06-02-2007  . CYSTOSCOPY/RETROGRADE/URETEROSCOPY/STONE EXTRACTION WITH BASKET  03/31/2012   Procedure: CYSTOSCOPY/RETROGRADE/URETEROSCOPY/STONE EXTRACTION WITH BASKET;  Surgeon: Malka So, MD;  Location: [facility];   Service: Urology;  Laterality: Right;   . CYSTOSCOPY/RETROGRADE/URETEROSCOPY/STONE EXTRACTION WITH BASKET Right 03/29/2013   Procedure: CYSTOSCOPY//URETEROSCOPY/STONE EXTRACTION WITH BASKET;  Surgeon: Malka So, MD;  [facility];  Service: Urology;  Laterality: Right;  . CYSTOSCOPY/RETROGRADE/URETEROSCOPY/STONE EXTRACTION WITH BASKET Right 10/14/2013   Procedure: CYSTOSCOPY/RETROGRADE/URETEROSCOPY/STONE EXTRACTION WITH BASKET;  Surgeon: Irine Seal, MD;  Location: [facility];  Service: Urology;  Laterality: Right;  . ESOPHAGOGASTRODUODENOSCOPY (EGD) WITH PROPOFOL N/A 04/12/2015   Procedure: ESOPHAGOGASTRODUODENOSCOPY (EGD) WITH PROPOFOL;  Surgeon: Arta Silence, MD;  [facility];  Service: Endoscopy;  Laterality: N/A;  . EUS N/A 04/12/2015   Procedure: ESOPHAGEAL ENDOSCOPIC ULTRASOUND (EUS) RADIAL;  Surgeon: Arta Silence, MD;  Location[facility];  Service: Endoscopy;  Laterality: N/A;  . EXTRACORPOREAL SHOCK WAVE LITHOTRIPSY  about [id number] Dr Reece Agar   Never completed procedure-could not tolerate pain. Had seizures due to pain  . EYE SURGERY  AGE 48   INJURY  . HERNIA REPAIR  AGE 49  . HOLMIUM LASER APPLICATION Right [id number]   Procedure: HOLMIUM LASER APPLICATION;  Surgeon: Irine Seal, MD;  Location: [facility];  Service: Urology;  Laterality: Right;  . LAPAROSCOPIC CHOLECYSTECTOMY  01-12-2005  . LEFT URETEROSCOPIC STONE EXTRACTION    LAST ONE 04-28-2009   SEVERAL SURGICAL STONE EXTRACTIONS  . LUMBAR LAMINECTOMY  09-30-2002   L4 - 5;  SPONDYLOLISTHISES W/ STENOSIS AND RADICULOPATHY  . PERCUTANEOUS NEPHROSTOLITHOTOMY  1975  . RIGHT URETEROSCOPIC STONE EXTRACTION  LAST ONE 02-21-2011   MULTIPLE SURGICAL STONE EXTRACTIONS  . TONSILECTOMY, ADENOIDECTOMY, BILATERAL MYRINGOTOMY AND TUBES  CHILD  . VAGINAL HYSTERECTOMY  1991   W/ BILATERAL SALPINGOOPHECTOMY    Allergies  Allergen Reactions  . Donepezil Hcl   . Ace Inhibitors Cough  .  Hctz [Hydrochlorothiazide]     Bladder problems  . Triptans     Avoid per neuro    Outpatient Encounter Medications as of 07/08/2019  Medication Sig  . aliskiren (TEKTURNA) 150 MG tablet Take 0.5 tablets (75 mg total) by mouth daily.  . Cranberry 400 MG CAPS Take 400 mg by mouth daily.  . DULoxetine (CYMBALTA) 60 MG capsule TAKE 1 CAPSULE BY MOUTH ONCE DAILY **DO NOT CRUSH*  . estradiol (ESTRACE) 1 MG tablet Take 1 mg by mouth every other day.   Marland Kitchen LORazepam (ATIVAN) 0.5 MG tablet Take 0.5 tablets (0.25 mg total) by mouth daily.  Marland Kitchen LORazepam (ATIVAN) 0.5 MG tablet Take 0.5 tablets (0.25 mg total) by mouth every 12 (twelve) hours as needed for up to 14 days for anxiety.  Marland Kitchen OLANZapine (ZYPREXA) 5 MG tablet Take 1 tablet by mouth daily at 6 PM.  . traZODone (DESYREL) 150 MG tablet Take 150 mg by mouth at bedtime.   . [DISCONTINUED] LORazepam (ATIVAN) 0.5 MG tablet Take 0.25 mg by mouth every 12 (twelve) hours as needed for anxiety.   No facility-administered encounter medications on file as of 07/08/2019.     Review of Systems  Unable to perform  ROS: Dementia    Immunization History  Administered Date(s) Administered  . Hepatitis A 11/15/2003, 06/11/2004, 12/25/2012  . Hepatitis B 11/14/2008, 12/15/2008  . Influenza Split 06/17/2011  . Influenza Whole 06/16/2010  . Influenza, High Dose Seasonal PF 05/27/2016  . Influenza,inj,Quad PF,6+ Mos 07/07/2018  . Influenza-Unspecified 07/10/2017  . Pneumococcal Conjugate-13 08/09/2014  . Pneumococcal Polysaccharide-23 04/22/2011, 11/15/2011  . Tdap 11/14/2008, 12/25/2012  . Typhoid Inactivated 12/25/2012  . Yellow Fever 12/25/2012  . Zoster 05/07/2012  . Zoster Recombinat (Shingrix) 01/03/2017, 04/04/2017   Pertinent  Health Maintenance Due  Topic Date Due  . INFLUENZA VACCINE  04/17/2019  . PNA vac Low Risk Adult  Completed   Fall Risk  11/17/2018 01/21/2018 10/29/2016  Falls in the past year? 1 Yes Yes  Number falls in past yr: 1 2 or  more 2 or more  Injury with Fall? 0 No Yes  Risk for fall due to : History of fall(s);Impaired mobility;Medication side effect;Impaired balance/gait;Mental status change - -  Follow up Falls evaluation completed;Education provided;Falls prevention discussed - Falls prevention discussed  Comment with staff at [facility]:    Vitals:   07/08/19 1227  Weight: 163 lb 3.2 oz (74 kg)   Body mass index is 29.85 kg/m. Physical Exam Vitals signs and nursing note reviewed.  Constitutional:      General: She is not in acute distress.    Appearance: She is not diaphoretic.  HENT:     Head: Normocephalic and atraumatic.  Neck:     Vascular: No JVD.  Cardiovascular:     Rate and Rhythm: Normal rate and regular rhythm.     Heart sounds: No murmur.  Pulmonary:     Effort: Pulmonary effort is normal. No respiratory distress.     Breath sounds: Normal breath sounds. No wheezing.  Abdominal:     General: Abdomen is flat. Bowel sounds are normal.     Palpations: Abdomen is soft.  Musculoskeletal:     Right lower leg: No edema.     Left lower leg: No edema.  Skin:    General: Skin is warm and dry.  Neurological:     General: No focal deficit present.     Mental Status: She is alert. Mental status is at baseline.  Psychiatric:     Comments: Flat affect, does not make eye contact.      Labs reviewed: No results for input(s): NA, K, CL, CO2, GLUCOSE, BUN, CREATININE, CALCIUM, MG, PHOS in the last 8760 hours. No results for input(s): AST, ALT, ALKPHOS, BILITOT, PROT, ALBUMIN in the last 8760 hours. No results for input(s): WBC, NEUTROABS, HGB, HCT, MCV, PLT in the last 8760 hours. Lab Results  Component Value Date   TSH 1.93 02/01/2019   Lab Results  Component Value Date   HGBA1C 5.7 02/28/2018   Lab Results  Component Value Date   CHOL 183 11/14/2017   HDL 64 11/14/2017   LDLCALC 102 11/14/2017   TRIG 86 11/14/2017   CHOLHDL 2.9 07/15/2016     Significant Diagnostic Results in last 30 days:  No results found.  Assessment/Plan 1. Cerebral amyloid angiopathy (CODE) Underlying reason for her severe dementia which necessitated a move to the memory care unit.   2. Essential hypertension Controlled, continue tekturna 75 mg qd   3. Chronic dementia, with behavioral disturbance (HCC) This increase in crying and agitation may be transient, will prescribed Ativan 0.25 mg bid prn x 14 days. If this  is a reoccurring issue will consider additional changes.   4. Osteopenia of multiple sites No longer going out for Dexa scans due to severe dementia. Continue VIt d supplementation. Continue estrace 1 mg qod.  5. Calcium oxalate kidney stones No current issues, avoid ca supplement due to this. Continues on cranberry supplement for UTI prevention.  6. Insomnia due to mental disorder Sleeping well, would not taper trazodone due to behaviors mentioned above    Family/ staff Communication: discussed with her nurse  Labs/tests ordered:  NA

## 2019-07-12 DIAGNOSIS — Z9189 Other specified personal risk factors, not elsewhere classified: Secondary | ICD-10-CM | POA: Diagnosis not present

## 2019-07-19 DIAGNOSIS — Z9189 Other specified personal risk factors, not elsewhere classified: Secondary | ICD-10-CM | POA: Diagnosis not present

## 2019-07-28 DIAGNOSIS — Z9189 Other specified personal risk factors, not elsewhere classified: Secondary | ICD-10-CM | POA: Diagnosis not present

## 2019-08-03 DIAGNOSIS — Z9189 Other specified personal risk factors, not elsewhere classified: Secondary | ICD-10-CM | POA: Diagnosis not present

## 2019-08-10 DIAGNOSIS — Z9189 Other specified personal risk factors, not elsewhere classified: Secondary | ICD-10-CM | POA: Diagnosis not present

## 2019-08-26 ENCOUNTER — Non-Acute Institutional Stay: Payer: Medicare Other | Admitting: Adult Health

## 2019-08-26 DIAGNOSIS — F482 Pseudobulbar affect: Secondary | ICD-10-CM | POA: Diagnosis not present

## 2019-08-26 DIAGNOSIS — I10 Essential (primary) hypertension: Secondary | ICD-10-CM

## 2019-08-26 DIAGNOSIS — F0391 Unspecified dementia with behavioral disturbance: Secondary | ICD-10-CM

## 2019-08-26 DIAGNOSIS — I680 Cerebral amyloid angiopathy: Secondary | ICD-10-CM

## 2019-08-26 DIAGNOSIS — F5105 Insomnia due to other mental disorder: Secondary | ICD-10-CM | POA: Diagnosis not present

## 2019-08-26 DIAGNOSIS — F03918 Unspecified dementia, unspecified severity, with other behavioral disturbance: Secondary | ICD-10-CM

## 2019-08-27 ENCOUNTER — Encounter: Payer: Self-pay | Admitting: Adult Health

## 2019-08-27 NOTE — Progress Notes (Signed)
Location:  Occupational psychologist of Service:  ALF (13) Provider:   Cindi Carbon, [place] [phone]   Gayland Curry, DO  Patient Care Team: Gayland Curry, DO as PCP - General (Geriatric Medicine) Irine Seal, MD as Attending Physician (Urology) Jodi Marble, MD as Consulting Physician (Otolaryngology) Martinique, Amy, MD as Consulting Physician (Dermatology) Gerda Diss, DO as Consulting Physician (Family Medicine) Deneise Lever, MD as Consulting Physician (Pulmonary Disease) Arta Silence, MD as Consulting Physician (Gastroenterology) Dohmeier, Asencion Partridge, MD as Consulting Physician (Neurology) Geanie Kenning, MD as Consulting Physician (Psychiatry)  Extended Emergency Contact Information Primary Emergency Contact: [name] Address: [address]          [place], [place] [address] [name] of [place] Phone: [phone] Mobile Phone: [phone] Relation: Spouse  Code Status:  DNR Goals of care: Advanced Directive information Advanced Directives 08/27/2019  Does Patient Have a Medical Advance Directive? Yes  Type of Paramedic of [place];Living will;Out of facility DNR (pink MOST or yellow form)  Does patient want to make changes to medical advance directive? No - Patient declined  Copy of [place] in Chart? Yes - validated most recent copy scanned in chart (See row information)  Would patient like information on creating a medical advance directive? -  Pre-existing out of facility DNR order (yellow form or pink MOST form) Yellow form placed in chart (order not valid for inpatient use);Pink MOST form placed in chart (order not valid for inpatient use)     Chief Complaint  Patient presents with  . Medical Management of Chronic Issues    HPI:  Pt is a 78 y.o. female seen today for medical management of chronic diseases.  She resides in the memory care unit due  to cerebral amyloid angiopathy and associate dementia with behavioral issues. She has had a slow decline over the past year. She remains verbal and ambulatory but over time she is talking less and her gait has become more unsteady. She can walk short distances but the staff sometimes uses a WC for safety with longer walks. In the past couple of weeks the nurse reports that she is having short crying spells. She has as needed ativan but the spurts of crying and very short and she can be redirected so the ativan has not been used. There are no reports of pain. Vitals are stable.    Past Medical History:  Diagnosis Date  . Arthritis    back and neck  . Carotid artery stenosis   . Cerebral amyloid angiopathy (CODE) 06/20/2016  . Chronic rhinosinusitis   . DDD (degenerative disc disease)   . Depression   . Diverticulosis of colon   . Frequency of urination   . Glaucoma   . Headache disorder 12/13/2014   Right temple  . Hearing loss   . History of kidney stones    MULTIBLE SURGICAL INTERVENTIONS  . History of skin cancer HX TOPICAL FACIAL [facility] 2013  . History of TIA (transient ischemic attack)    NOV 2013-- NO RESIDUAL  . Hypertension   . IBS (irritable bowel syndrome)   . Memory difficulty 05/14/2016  . Memory loss   . Mild asthma    COLD INDUCED  . Right ureteral stone   . Seasonal and perennial allergic rhinitis   . Shingles    EYEBALL 09-17-2013  . Spondylosis    Past Surgical History:  Procedure Laterality Date  .  BUNIONECTOMY     BILATERAL  . CALDWELL LUC     sinus drainage procedure  . CYSTO/ BILATERAL URETEROSCOPIC STONE EXTRACTIONS  02-09-2003  . CYSTO/ BILATERAL URETEROSCOPY/  LASER OF STONES LEFT SIDE  06-02-2007  . CYSTOSCOPY/RETROGRADE/URETEROSCOPY/STONE EXTRACTION WITH BASKET  03/31/2012   Procedure: CYSTOSCOPY/RETROGRADE/URETEROSCOPY/STONE EXTRACTION WITH BASKET;  Surgeon: Malka So, MD;  Location: [facility];  Service: Urology;  Laterality:  Right;   . CYSTOSCOPY/RETROGRADE/URETEROSCOPY/STONE EXTRACTION WITH BASKET Right 03/29/2013   Procedure: CYSTOSCOPY//URETEROSCOPY/STONE EXTRACTION WITH BASKET;  Surgeon: Malka So, MD;  Location: [facility];  Service: Urology;  Laterality: Right;  . CYSTOSCOPY/RETROGRADE/URETEROSCOPY/STONE EXTRACTION WITH BASKET Right 10/14/2013   Procedure: CYSTOSCOPY/RETROGRADE/URETEROSCOPY/STONE EXTRACTION WITH BASKET;  Surgeon: Irine Seal, MD;  Location: [facility];  Service: Urology;  Laterality: Right;  . ESOPHAGOGASTRODUODENOSCOPY (EGD) WITH PROPOFOL N/A 04/12/2015   Procedure: ESOPHAGOGASTRODUODENOSCOPY (EGD) WITH PROPOFOL;  Surgeon: Arta Silence, MD;  [facility];  Service: Endoscopy;  Laterality: N/A;  . EUS N/A 04/12/2015   Procedure: ESOPHAGEAL ENDOSCOPIC ULTRASOUND (EUS) RADIAL;  Surgeon: Arta Silence, MD;  Location: [facility];  Service: Endoscopy;  Laterality: N/A;  . EXTRACORPOREAL SHOCK WAVE LITHOTRIPSY  about [id number] Dr Reece Agar   Never completed procedure-could not tolerate pain. Had seizures due to pain  . EYE SURGERY  AGE 46   INJURY  . HERNIA REPAIR  AGE 55  . HOLMIUM LASER APPLICATION Right [id number]   Procedure: HOLMIUM LASER APPLICATION;  Surgeon: Irine Seal, MD;  Location: [facility];  Service: Urology;  Laterality: Right;  . LAPAROSCOPIC CHOLECYSTECTOMY  01-12-2005  . LEFT URETEROSCOPIC STONE EXTRACTION    LAST ONE 04-28-2009   SEVERAL SURGICAL STONE EXTRACTIONS  . LUMBAR LAMINECTOMY  09-30-2002   L4 - 5;  SPONDYLOLISTHISES W/ STENOSIS AND RADICULOPATHY  . PERCUTANEOUS NEPHROSTOLITHOTOMY  1975  . RIGHT URETEROSCOPIC STONE EXTRACTION  LAST ONE 02-21-2011   MULTIPLE SURGICAL STONE EXTRACTIONS  . TONSILECTOMY, ADENOIDECTOMY, BILATERAL MYRINGOTOMY AND TUBES  CHILD  . VAGINAL HYSTERECTOMY  1991   W/ BILATERAL SALPINGOOPHECTOMY    Allergies  Allergen Reactions  . Donepezil Hcl   . Ace Inhibitors Cough  . Hctz [Hydrochlorothiazide]      Bladder problems  . Triptans     Avoid per neuro    Outpatient Encounter Medications as of 08/26/2019  Medication Sig  . LORazepam (ATIVAN) 0.5 MG tablet Take 0.25 mg by mouth 2 (two) times daily as needed for anxiety.  Marland Kitchen aliskiren (TEKTURNA) 150 MG tablet Take 0.5 tablets (75 mg total) by mouth daily.  . Cranberry 400 MG CAPS Take 400 mg by mouth daily.  . DULoxetine (CYMBALTA) 60 MG capsule TAKE 1 CAPSULE BY MOUTH ONCE DAILY **DO NOT CRUSH*  . estradiol (ESTRACE) 1 MG tablet Take 1 mg by mouth every other day.   Marland Kitchen LORazepam (ATIVAN) 0.5 MG tablet Take 0.5 tablets (0.25 mg total) by mouth daily.  Marland Kitchen OLANZapine (ZYPREXA) 5 MG tablet Take 1 tablet by mouth daily at 6 PM.  . traZODone (DESYREL) 150 MG tablet Take 150 mg by mouth at bedtime.    No facility-administered encounter medications on file as of 08/26/2019.    Review of Systems  Unable to perform ROS: Dementia    Immunization History  Administered Date(s) Administered  . Hepatitis A 11/15/2003, 06/11/2004, 12/25/2012  . Hepatitis B 11/14/2008, 12/15/2008  . Influenza Split 06/17/2011  . Influenza Whole 06/16/2010  . Influenza, High Dose Seasonal PF 05/27/2016, 07/15/2019  . Influenza,inj,Quad PF,6+ Mos 07/07/2018  .  Influenza-Unspecified 07/10/2017  . Pneumococcal Conjugate-13 08/09/2014  . Pneumococcal Polysaccharide-23 04/22/2011, 11/15/2011  . Tdap 11/14/2008, 12/25/2012  . Typhoid Inactivated 12/25/2012  . Yellow Fever 12/25/2012  . Zoster 05/07/2012  . Zoster Recombinat (Shingrix) 01/03/2017, 04/04/2017   Pertinent  Health Maintenance Due  Topic Date Due  . INFLUENZA VACCINE  Completed  . PNA vac Low Risk Adult  Completed   Fall Risk  11/17/2018 01/21/2018 10/29/2016  Falls in the past year? 1 Yes Yes  Number falls in past yr: 1 2 or more 2 or more  Injury with Fall? 0 No Yes  Risk for fall due to : History of fall(s);Impaired mobility;Medication side effect;Impaired balance/gait;Mental status change - -  Follow  up Falls evaluation completed;Education provided;Falls prevention discussed - Falls prevention discussed  Comment with staff at [facility]:    Vitals:   08/27/19 0841  Temp: (!) 97.5 F (36.4 C)  Weight: 165 lb 11.2 oz (75.2 kg)   Body mass index is 30.31 kg/m. Physical Exam Vitals and nursing note reviewed.  Constitutional:      General: She is not in acute distress.    Appearance: She is not diaphoretic.  HENT:     Head: Normocephalic and atraumatic.     Nose: Nose normal. No congestion.     Mouth/Throat:     Mouth: Mucous membranes are moist.     Pharynx: Oropharynx is clear.  Eyes:     Conjunctiva/sclera: Conjunctivae normal.     Pupils: Pupils are equal, round, and reactive to light.  Neck:     Vascular: No JVD.  Cardiovascular:     Rate and Rhythm: Normal rate and regular rhythm.     Heart sounds: No murmur.  Pulmonary:     Effort: Pulmonary effort is normal. No respiratory distress.     Breath sounds: Normal breath sounds. No wheezing.  Abdominal:     General: Bowel sounds are normal. There is no distension.     Palpations: Abdomen is soft.  Musculoskeletal:     Right lower leg: No edema.     Left lower leg: No edema.  Skin:    General: Skin is warm and dry.  Neurological:     General: No focal deficit present.     Mental Status: She is alert. Mental status is at baseline.  Psychiatric:        Mood and Affect: Mood normal.     Labs reviewed: No results for input(s): NA, K, CL, CO2, GLUCOSE, BUN, CREATININE, CALCIUM, MG, PHOS in the last 8760 hours. No results for input(s): AST, ALT, ALKPHOS, BILITOT, PROT, ALBUMIN in the last 8760 hours. No results for input(s): WBC, NEUTROABS, HGB, HCT, MCV, PLT in the last 8760 hours. Lab Results  Component Value Date   TSH 1.93 02/01/2019   Lab Results  Component Value Date   HGBA1C 5.7 02/28/2018   Lab Results  Component Value Date   CHOL 183 11/14/2017   HDL 64 11/14/2017     LDLCALC 102 11/14/2017   TRIG 86 11/14/2017   CHOLHDL 2.9 07/15/2016    Significant Diagnostic Results in last 30 days:  No results found.  Assessment/Plan 1. Cerebral amyloid angiopathy (CODE) She has shown signs of progressive decline in the memory care unit. She is not currently on medication for her memory due to lack of benefit. Continue supportive care only.   2. Chronic dementia, with behavioral disturbance ([place]) Due to #1 Has a DNR and  most form indicating no hospitalizations. Due to her recent bouts of crying recommend no GDR to her current regimen. Continue Zyprexa, ativan 0.25 mg each am and as needed, and cymbalta 60 mg qd.   3. PBA (pseudobulbar affect) Symptoms noted but controlled with current regimen  4. Insomnia due to mental disorder No issues sleeping. Continue trazodone 150 mg qhs  5. Essential hypertension Controlled. Continue Tekturna 75 mg qd     Family/ staff Communication: nurse   Labs/tests ordered:  NA

## 2019-09-13 DIAGNOSIS — Z20828 Contact with and (suspected) exposure to other viral communicable diseases: Secondary | ICD-10-CM | POA: Diagnosis not present

## 2019-09-13 DIAGNOSIS — Z9189 Other specified personal risk factors, not elsewhere classified: Secondary | ICD-10-CM | POA: Diagnosis not present

## 2019-09-20 DIAGNOSIS — Z20828 Contact with and (suspected) exposure to other viral communicable diseases: Secondary | ICD-10-CM | POA: Diagnosis not present

## 2019-09-20 DIAGNOSIS — Z9189 Other specified personal risk factors, not elsewhere classified: Secondary | ICD-10-CM | POA: Diagnosis not present

## 2019-09-21 DIAGNOSIS — Z23 Encounter for immunization: Secondary | ICD-10-CM | POA: Diagnosis not present

## 2019-10-04 DIAGNOSIS — Z9189 Other specified personal risk factors, not elsewhere classified: Secondary | ICD-10-CM | POA: Diagnosis not present

## 2019-10-04 DIAGNOSIS — Z20828 Contact with and (suspected) exposure to other viral communicable diseases: Secondary | ICD-10-CM | POA: Diagnosis not present

## 2019-10-19 ENCOUNTER — Non-Acute Institutional Stay: Payer: Medicare Other | Admitting: Internal Medicine

## 2019-10-19 ENCOUNTER — Encounter: Payer: Self-pay | Admitting: Internal Medicine

## 2019-10-19 DIAGNOSIS — F0391 Unspecified dementia with behavioral disturbance: Secondary | ICD-10-CM

## 2019-10-19 DIAGNOSIS — F5105 Insomnia due to other mental disorder: Secondary | ICD-10-CM | POA: Diagnosis not present

## 2019-10-19 DIAGNOSIS — U071 COVID-19: Secondary | ICD-10-CM | POA: Diagnosis not present

## 2019-10-19 DIAGNOSIS — M8589 Other specified disorders of bone density and structure, multiple sites: Secondary | ICD-10-CM

## 2019-10-19 DIAGNOSIS — I10 Essential (primary) hypertension: Secondary | ICD-10-CM | POA: Diagnosis not present

## 2019-10-19 DIAGNOSIS — Z23 Encounter for immunization: Secondary | ICD-10-CM | POA: Diagnosis not present

## 2019-10-19 DIAGNOSIS — E210 Primary hyperparathyroidism: Secondary | ICD-10-CM | POA: Diagnosis not present

## 2019-10-19 DIAGNOSIS — F03918 Unspecified dementia, unspecified severity, with other behavioral disturbance: Secondary | ICD-10-CM

## 2019-10-19 DIAGNOSIS — I680 Cerebral amyloid angiopathy: Secondary | ICD-10-CM | POA: Diagnosis not present

## 2019-10-19 NOTE — Progress Notes (Signed)
Patient ID: Ashley [name], female   DOB: December 27, 1940, 79 y.o.   MRN: [id number].  Location:  [place] Room Number: [place] of Service:  ALF [phone]) Provider:   Gayland Curry, DO  Patient Care Team: Gayland Curry, DO as PCP - General (Geriatric Medicine) Irine Seal, MD as Attending Physician (Urology) Jodi Marble, MD as Consulting Physician (Otolaryngology) Martinique, Amy, MD as Consulting Physician (Dermatology) Gerda Diss, DO as Consulting Physician (Family Medicine) Deneise Lever, MD as Consulting Physician (Pulmonary Disease) Arta Silence, MD as Consulting Physician (Gastroenterology) Dohmeier, Asencion Partridge, MD as Consulting Physician (Neurology) Geanie Kenning, MD as Consulting Physician (Psychiatry)  Extended Emergency Contact Information Primary Emergency Contact: [name] Address: [address]          [place], [place] [address] [name] of [place] Phone: [phone] Mobile Phone: [phone] Relation: Spouse  Code Status:  DNR, MOST, comfort care Goals of care: Advanced Directive information Advanced Directives 10/19/2019  Does Patient Have a Medical Advance Directive? Yes  Type of Advance Directive Out of facility DNR (pink MOST or yellow form);Healthcare Power of Attorney  Does patient want to make changes to medical advance directive? No - Guardian declined  Copy of [place] in Chart? -  Would patient like information on creating a medical advance directive? -  Pre-existing out of facility DNR order (yellow form or pink MOST form) Pink MOST/Yellow Form most recent copy in chart - Physician notified to receive inpatient order     Chief Complaint  Patient presents with  . Medical Management of Chronic Issues    medical mgt    HPI:  Pt is a 79 y.o. female seen today for medical management of chronic diseases.  Ashley [name] has a h/o cerebral amyloid angiopathy causing her major  neurocognitive disorder (dementia).  Ashley [name] has struggled with hallucinations and delusions.  Ashley [name] used to wander the unit, but after her dementia became more severe, Ashley [name] has become nonambulatory and spends most of her time in one of the recliner chairs in the social area of memory care.  Ashley [name] is pleasant and much quieter than Ashley [name] once was.  Ashley [name] completed her 14 days of "rehab" stay for covid-19 positivity--Ashley [name] returned to memory care yesterday.  Ashley [name] remains weak, but nursing and activities who know her well and spend time with her have noted Ashley [name]'s a bit more talkative since returning.  No new concerns have emerged.  No coughing, sob, fever, diarrhea at this time.  Ashley [name] is sleeping with her current regimen including trazodone at hs.  Ashley [name] takes lorazepam daily for anxiety and agitation, plus has a prn dose bid for agitation with care.  Ashley [name] is on cymbalta for depression.  Ashley [name] takes zyprexa for her psychosis.    Bowels are moving with prn mom occasionally.   Ashley [name] remains on estrace hormone therapy for bone protection at this point.   BP is controlled with her tekturna. Intake is stable.  Wt down 4 lbs since seen 12/10 (amid covid) but Ashley [name]'d actually been gaining since august (decreased activity?).  Past Medical History:  Diagnosis Date  . Arthritis    back and neck  . Carotid artery stenosis   . Cerebral amyloid angiopathy (CODE) 06/20/2016  . Chronic rhinosinusitis   . DDD (degenerative disc disease)   . Depression   . Diverticulosis of colon   . Frequency of urination   . Glaucoma   . Headache disorder 12/13/2014   Right  temple  . Hearing loss   . History of kidney stones    MULTIBLE SURGICAL INTERVENTIONS  . History of skin cancer HX TOPICAL FACIAL [facility] 2013  . History of TIA (transient ischemic attack)    NOV 2013-- NO RESIDUAL  . Hypertension   . IBS (irritable bowel syndrome)   . Memory difficulty 05/14/2016  . Memory loss   . Mild asthma    COLD INDUCED  . Right ureteral stone   .  Seasonal and perennial allergic rhinitis   . Shingles    EYEBALL 09-17-2013  . Spondylosis    Past Surgical History:  Procedure Laterality Date  . BUNIONECTOMY     BILATERAL  . CALDWELL LUC     sinus drainage procedure  . CYSTO/ BILATERAL URETEROSCOPIC STONE EXTRACTIONS  02-09-2003  . CYSTO/ BILATERAL URETEROSCOPY/  LASER OF STONES LEFT SIDE  06-02-2007  . CYSTOSCOPY/RETROGRADE/URETEROSCOPY/STONE EXTRACTION WITH BASKET  03/31/2012   Procedure: CYSTOSCOPY/RETROGRADE/URETEROSCOPY/STONE EXTRACTION WITH BASKET;  Surgeon: Malka So, MD;  Location: [facility];  Service: Urology;  Laterality: Right;   . CYSTOSCOPY/RETROGRADE/URETEROSCOPY/STONE EXTRACTION WITH BASKET Right 03/29/2013   Procedure: CYSTOSCOPY//URETEROSCOPY/STONE EXTRACTION WITH BASKET;  Surgeon: Malka So, MD;  [facility];  Service: Urology;  Laterality: Right;  . CYSTOSCOPY/RETROGRADE/URETEROSCOPY/STONE EXTRACTION WITH BASKET Right 10/14/2013   Procedure: CYSTOSCOPY/RETROGRADE/URETEROSCOPY/STONE EXTRACTION WITH BASKET;  Surgeon: Irine Seal, MD;  Location: [facility];  Service: Urology;  Laterality: Right;  . ESOPHAGOGASTRODUODENOSCOPY (EGD) WITH PROPOFOL N/A 04/12/2015   Procedure: ESOPHAGOGASTRODUODENOSCOPY (EGD) WITH PROPOFOL;  Surgeon: Arta Silence, MD;  [facility];  Service: Endoscopy;  Laterality: N/A;  . EUS N/A 04/12/2015   Procedure: ESOPHAGEAL ENDOSCOPIC ULTRASOUND (EUS) RADIAL;  Surgeon: Arta Silence, MD;  Location[facility];  Service: Endoscopy;  Laterality: N/A;  . EXTRACORPOREAL SHOCK WAVE LITHOTRIPSY  about [id number] Dr Reece Agar   Never completed procedure-could not tolerate pain. Had seizures due to pain  . EYE SURGERY  AGE 37   INJURY  . HERNIA REPAIR  AGE 73  . HOLMIUM LASER APPLICATION Right [id number]   Procedure: HOLMIUM LASER APPLICATION;  Surgeon: Irine Seal, MD;  [facility];  Service: Urology;  Laterality: Right;  .  LAPAROSCOPIC CHOLECYSTECTOMY  01-12-2005  . LEFT URETEROSCOPIC STONE EXTRACTION    LAST ONE 04-28-2009   SEVERAL SURGICAL STONE EXTRACTIONS  . LUMBAR LAMINECTOMY  09-30-2002   L4 - 5;  SPONDYLOLISTHISES W/ STENOSIS AND RADICULOPATHY  . PERCUTANEOUS NEPHROSTOLITHOTOMY  1975  . RIGHT URETEROSCOPIC STONE EXTRACTION  LAST ONE 02-21-2011   MULTIPLE SURGICAL STONE EXTRACTIONS  . TONSILECTOMY, ADENOIDECTOMY, BILATERAL MYRINGOTOMY AND TUBES  CHILD  . VAGINAL HYSTERECTOMY  1991   W/ BILATERAL SALPINGOOPHECTOMY    Allergies  Allergen Reactions  . Donepezil Hcl   . Ace Inhibitors Cough  . Hctz [Hydrochlorothiazide]     Bladder problems  . Triptans     Avoid per neuro    Outpatient Encounter Medications as of 10/19/2019  Medication Sig  . aliskiren (TEKTURNA) 150 MG tablet Take 0.5 tablets (75 mg total) by mouth daily.  . Cranberry 400 MG CAPS Take 400 mg by mouth daily.  . DULoxetine (CYMBALTA) 60 MG capsule TAKE 1 CAPSULE BY MOUTH ONCE DAILY **DO NOT CRUSH*  . estradiol (ESTRACE) 1 MG tablet Take 1 mg by mouth every other day.   Marland Kitchen LORazepam (ATIVAN) 0.5 MG tablet Take 0.5 tablets (0.25 mg total) by mouth daily.  Marland Kitchen LORazepam (ATIVAN) 0.5 MG tablet Take 0.25 mg by  mouth 2 (two) times daily as needed for anxiety.  . magnesium hydroxide (MILK OF MAGNESIA) 400 MG/5ML suspension Take 5 mLs by mouth daily as needed for mild constipation. For Residents who do not take routine laxative: Milk of Magnesia 45cc po daily now and then daily PRN x 2 days (EXCEPT in renal failure/HD pts).  . OLANZapine (ZYPREXA) 5 MG tablet Take 1 tablet by mouth daily at 6 PM.  . traZODone (DESYREL) 150 MG tablet Take 150 mg by mouth at bedtime.    No facility-administered encounter medications on file as of 10/19/2019.    Review of Systems  Constitutional: Positive for activity change and fatigue. Negative for appetite change, chills, fever and unexpected weight change.  HENT: Negative for sore throat.   Eyes: Negative  for visual disturbance.  Respiratory: Negative for chest tightness and shortness of breath.   Cardiovascular: Negative for chest pain, palpitations and leg swelling.  Gastrointestinal: Negative for abdominal pain, constipation, diarrhea, nausea and vomiting.  Genitourinary: Negative for dysuria.  Musculoskeletal: Positive for gait problem. Negative for arthralgias and back pain.  Skin: Negative for color change.  Neurological: Positive for weakness. Negative for dizziness.  Psychiatric/Behavioral: Positive for agitation, behavioral problems and confusion. Negative for hallucinations and sleep disturbance. The patient is nervous/anxious.        No recent sleep or hallucination problems reported    Immunization History  Administered Date(s) Administered  . Hepatitis A 11/15/2003, 06/11/2004, 12/25/2012  . Hepatitis B 11/14/2008, 12/15/2008  . Influenza Split 06/17/2011  . Influenza Whole 06/16/2010  . Influenza, High Dose Seasonal PF 05/27/2016, 07/15/2019  . Influenza,inj,Quad PF,6+ Mos 07/07/2018  . Influenza-Unspecified 07/10/2017  . Moderna SARS-COVID-2 Vaccination 09/21/2019  . Pneumococcal Conjugate-13 08/09/2014  . Pneumococcal Polysaccharide-23 04/22/2011, 11/15/2011  . Tdap 11/14/2008, 12/25/2012  . Typhoid Inactivated 12/25/2012  . Yellow Fever 12/25/2012  . Zoster 05/07/2012  . Zoster Recombinat (Shingrix) 01/03/2017, 04/04/2017   Pertinent  Health Maintenance Due  Topic Date Due  . INFLUENZA VACCINE  Completed  . PNA vac Low Risk Adult  Completed   Fall Risk  11/17/2018 01/21/2018 10/29/2016  Falls in the past year? 1 Yes Yes  Number falls in past yr: 1 2 or more 2 or more  Injury with Fall? 0 No Yes  Risk for fall due to : History of fall(s);Impaired mobility;Medication side effect;Impaired balance/gait;Mental status change - -  Follow up Falls evaluation completed;Education provided;Falls prevention discussed - Falls prevention discussed  Comment with staff at  [facility]:    Vitals:   10/19/19 1524  BP: 126/74  Pulse: 75  Temp: 97.6 F (36.4 C)  SpO2: 98%  Weight: 161 lb (73 kg)  Height: 5\' 2"  (1.575 m)   Body mass index is 29.45 kg/m. Physical Exam Vitals reviewed.  Constitutional:      General: Ashley [name] is not in acute distress.    Appearance: Normal appearance. Ashley [name] is not ill-appearing or toxic-appearing.  HENT:     Head: Normocephalic and atraumatic.     Right Ear: External ear normal.     Left Ear: External ear normal.  Cardiovascular:     Rate and Rhythm: Normal rate and regular rhythm.     Pulses: Normal pulses.     Heart sounds: Normal heart sounds. No murmur.  Pulmonary:     Effort: Pulmonary effort is normal.     Breath sounds: Normal breath sounds. No wheezing, rhonchi or rales.  Abdominal:     General:  Bowel sounds are normal.     Palpations: Abdomen is soft.     Tenderness: There is no abdominal tenderness.  Musculoskeletal:        General: Normal range of motion.     Right lower leg: No edema.     Left lower leg: No edema.  Skin:    General: Skin is warm and dry.  Neurological:     General: No focal deficit present.     Mental Status: Ashley [name] is alert.     Motor: Weakness present.     Gait: Gait abnormal.     Comments: Not ambulatory on her own, requires assistance for safety  Psychiatric:        Mood and Affect: Mood normal.     Labs reviewed: Lab Results  Component Value Date   TSH 1.93 02/01/2019   Lab Results  Component Value Date   HGBA1C 5.7 02/28/2018   Lab Results  Component Value Date   CHOL 183 11/14/2017   HDL 64 11/14/2017   LDLCALC 102 11/14/2017   TRIG 86 11/14/2017   CHOLHDL 2.9 07/15/2016    Assessment/Plan 1. COVID-19 -seems Ashley [name] is nearly recovered, just a bit weak and fatigued at this time  2. Cerebral amyloid angiopathy (CODE) -cause of her dementia  3. Chronic dementia, with behavioral disturbance ([place]) -seemingly stable recently, no  longer ambulatory independently, less verbal (though actually a bit more talkative since returning from covid unit) -cont comfort care in memory care  4. Insomnia due to mental disorder -continue trazodone for sleep  5. Essential hypertension -bp well controlled on her tekturna--cont same and monitor  6. Osteopenia of multiple sites -remains on estrogen supplement for her bones and is high risk for falls plus weaning this could certainly cause challenges with her behaviors  7.  Primary hyperparathyroidism -has h/o kidney stones as complication -not on any calcitriol at this time -maintain hydration and monitor  Family/ staff Communication: discussed with memory care nurse, activities  Labs/tests ordered:  No new  Ryenn Howeth L. Nykayla Marcelli, D.O. [facility] [address], [place] [address] Cell Phone (Mon-Fri 8am-5pm):  [phone] On Call:  [phone] & follow prompts after 5pm & weekends Office Phone:  [phone] Office Fax:  [phone]

## 2019-10-26 ENCOUNTER — Encounter: Payer: Self-pay | Admitting: Internal Medicine

## 2019-12-23 ENCOUNTER — Other Ambulatory Visit: Payer: Self-pay | Admitting: Adult Health

## 2019-12-23 ENCOUNTER — Non-Acute Institutional Stay: Payer: Medicare Other | Admitting: Adult Health

## 2019-12-23 DIAGNOSIS — F0391 Unspecified dementia with behavioral disturbance: Secondary | ICD-10-CM

## 2019-12-23 DIAGNOSIS — I10 Essential (primary) hypertension: Secondary | ICD-10-CM | POA: Diagnosis not present

## 2019-12-23 DIAGNOSIS — I680 Cerebral amyloid angiopathy: Secondary | ICD-10-CM | POA: Diagnosis not present

## 2019-12-23 DIAGNOSIS — F03918 Unspecified dementia, unspecified severity, with other behavioral disturbance: Secondary | ICD-10-CM

## 2019-12-23 DIAGNOSIS — E210 Primary hyperparathyroidism: Secondary | ICD-10-CM

## 2019-12-23 DIAGNOSIS — F5105 Insomnia due to other mental disorder: Secondary | ICD-10-CM

## 2019-12-23 DIAGNOSIS — R296 Repeated falls: Secondary | ICD-10-CM

## 2019-12-23 DIAGNOSIS — F482 Pseudobulbar affect: Secondary | ICD-10-CM

## 2019-12-23 MED ORDER — LORAZEPAM 0.5 MG PO TABS: 0.2500 mg | ORAL_TABLET | Freq: Every day | ORAL | 5 refills | Status: DC

## 2019-12-24 ENCOUNTER — Encounter: Payer: Self-pay | Admitting: Adult Health

## 2019-12-24 NOTE — Progress Notes (Signed)
Location:  Occupational psychologist of Service:  ALF (13) Provider:   Cindi Carbon, ANP [place] [phone]   Gayland Curry, DO  Patient Care Team: Gayland Curry, DO as PCP - General (Geriatric Medicine) Irine Seal, MD as Attending Physician (Urology) Jodi Marble, MD as Consulting Physician (Otolaryngology) Martinique, Amy, MD as Consulting Physician (Dermatology) Gerda Diss, DO as Consulting Physician (Family Medicine) Deneise Lever, MD as Consulting Physician (Pulmonary Disease) Arta Silence, MD as Consulting Physician (Gastroenterology) Dohmeier, Asencion Partridge, MD as Consulting Physician (Neurology) Geanie Kenning, MD as Consulting Physician (Psychiatry)  Extended Emergency Contact Information Primary Emergency Contact: [name] Address: [address]          [place], [place] [address] [name] of [place] Phone: [phone] Mobile Phone: [phone] Relation: Spouse  Code Status:  DNR Goals of care: Advanced Directive information Advanced Directives 10/19/2019  Does Patient Have a Medical Advance Directive? Yes  Type of Advance Directive Out of facility DNR (pink MOST or yellow form);Healthcare Power of Attorney  Does patient want to make changes to medical advance directive? No - Guardian declined  Copy of [place] in Chart? -  Would patient like information on creating a medical advance directive? -  Pre-existing out of facility DNR order (yellow form or pink MOST form) Pink MOST/Yellow Form most recent copy in chart - Physician notified to receive inpatient order     Chief Complaint  Patient presents with  . Medical Management of Chronic Issues    HPI:  Pt is a 79 y.o. female seen today for medical management of chronic diseases.   She resides in the memory care unit due to a hx of cerebral amyloid angiopathy that led to dementia. She is progressively weaker and less verbal. She has had  two falls recently. No major injuries. The WS are monitoring her closely and they have provided her with a juditta chair which tilts back to prevent further falls. She tends to sit sideways in a chair and gets out of bed without assistance. She is taking naps each morning now and is resting comfortably in her bed for my visit. The nurse reports that she has occasional crying episodes but there is no change from baseline. Her BP is well controlled. No issues with sleep or appetite are reported.    Past Medical History:  Diagnosis Date  . Arthritis    back and neck  . Carotid artery stenosis   . Cerebral amyloid angiopathy (CODE) 06/20/2016  . Chronic rhinosinusitis   . DDD (degenerative disc disease)   . Depression   . Diverticulosis of colon   . Frequency of urination   . Glaucoma   . Headache disorder 12/13/2014   Right temple  . Hearing loss   . History of kidney stones    MULTIBLE SURGICAL INTERVENTIONS  . History of skin cancer HX TOPICAL FACIAL [place] 2013  . History of TIA (transient ischemic attack)    NOV 2013-- NO RESIDUAL  . Hypertension   . IBS (irritable bowel syndrome)   . Memory difficulty 05/14/2016  . Memory loss   . Mild asthma    COLD INDUCED  . Right ureteral stone   . Seasonal and perennial allergic rhinitis   . Shingles    EYEBALL 09-17-2013  . Spondylosis    Past Surgical History:  Procedure Laterality Date  . BUNIONECTOMY     BILATERAL  . CALDWELL LUC  sinus drainage procedure  . CYSTO/ BILATERAL URETEROSCOPIC STONE EXTRACTIONS  02-09-2003  . CYSTO/ BILATERAL URETEROSCOPY/  LASER OF STONES LEFT SIDE  06-02-2007  . CYSTOSCOPY/RETROGRADE/URETEROSCOPY/STONE EXTRACTION WITH BASKET  03/31/2012   Procedure: CYSTOSCOPY/RETROGRADE/URETEROSCOPY/STONE EXTRACTION WITH BASKET;  Surgeon: Malka So, MD;  Location: [facility];  Service: Urology;  Laterality: Right;   . CYSTOSCOPY/RETROGRADE/URETEROSCOPY/STONE EXTRACTION WITH BASKET Right  03/29/2013   Procedure: CYSTOSCOPY//URETEROSCOPY/STONE EXTRACTION WITH BASKET;  Surgeon: Malka So, MD;  Location: [facility];  Service: Urology;  Laterality: Right;  . CYSTOSCOPY/RETROGRADE/URETEROSCOPY/STONE EXTRACTION WITH BASKET Right 10/14/2013   Procedure: CYSTOSCOPY/RETROGRADE/URETEROSCOPY/STONE EXTRACTION WITH BASKET;  Surgeon: Irine Seal, MD;  Location: [facility];  Service: Urology;  Laterality: Right;  . ESOPHAGOGASTRODUODENOSCOPY (EGD) WITH PROPOFOL N/A 04/12/2015   Procedure: ESOPHAGOGASTRODUODENOSCOPY (EGD) WITH PROPOFOL;  Surgeon: Arta Silence, MD;  Location: [facility];  Service: Endoscopy;  Laterality: N/A;  . EUS N/A 04/12/2015   Procedure: ESOPHAGEAL ENDOSCOPIC ULTRASOUND (EUS) RADIAL;  Surgeon: Arta Silence, MD;  [facility]: [facility];  Service: Endoscopy;  Laterality: N/A;  . EXTRACORPOREAL SHOCK WAVE LITHOTRIPSY  about [id number] Dr Reece Agar   Never completed procedure-could not tolerate pain. Had seizures due to pain  . EYE SURGERY  AGE 22   INJURY  . HERNIA REPAIR  AGE 82  . HOLMIUM LASER APPLICATION Right [id number]   Procedure: HOLMIUM LASER APPLICATION;  Surgeon: Irine Seal, MD;  Location: [facility];  Service: Urology;  Laterality: Right;  . LAPAROSCOPIC CHOLECYSTECTOMY  01-12-2005  . LEFT URETEROSCOPIC STONE EXTRACTION    LAST ONE 04-28-2009   SEVERAL SURGICAL STONE EXTRACTIONS  . LUMBAR LAMINECTOMY  09-30-2002   L4 - 5;  SPONDYLOLISTHISES W/ STENOSIS AND RADICULOPATHY  . PERCUTANEOUS NEPHROSTOLITHOTOMY  1975  . RIGHT URETEROSCOPIC STONE EXTRACTION  LAST ONE 02-21-2011   MULTIPLE SURGICAL STONE EXTRACTIONS  . TONSILECTOMY, ADENOIDECTOMY, BILATERAL MYRINGOTOMY AND TUBES  CHILD  . VAGINAL HYSTERECTOMY  1991   W/ BILATERAL SALPINGOOPHECTOMY    Allergies  Allergen Reactions  . Donepezil Hcl   . Ace Inhibitors Cough  . Hctz [Hydrochlorothiazide]     Bladder problems  . Triptans     Avoid per neuro    Outpatient Encounter  Medications as of 12/23/2019  Medication Sig  . aliskiren (TEKTURNA) 150 MG tablet Take 0.5 tablets (75 mg total) by mouth daily.  . Cranberry 400 MG CAPS Take 400 mg by mouth daily.  . DULoxetine (CYMBALTA) 60 MG capsule TAKE 1 CAPSULE BY MOUTH ONCE DAILY **DO NOT CRUSH*  . estradiol (ESTRACE) 1 MG tablet Take 1 mg by mouth every other day.   Marland Kitchen LORazepam (ATIVAN) 0.5 MG tablet Take 0.5 tablets (0.25 mg total) by mouth daily.  Marland Kitchen OLANZapine (ZYPREXA) 5 MG tablet Take 1 tablet by mouth daily at 6 PM.  . sennosides-docusate sodium (SENOKOT-S) 8.6-50 MG tablet Take 2 tablets by mouth at bedtime.  . traZODone (DESYREL) 150 MG tablet Take 150 mg by mouth at bedtime.   . [DISCONTINUED] LORazepam (ATIVAN) 0.5 MG tablet Take 0.25 mg by mouth 2 (two) times daily as needed for anxiety.  . [DISCONTINUED] magnesium hydroxide (MILK OF MAGNESIA) 400 MG/5ML suspension Take 5 mLs by mouth daily as needed for mild constipation. For Residents who do not take routine laxative: Milk of Magnesia 45cc po daily now and then daily PRN x 2 days (EXCEPT in renal failure/HD pts).   No facility-administered encounter medications on file as of 12/23/2019.    Review of Systems  Unable to perform  ROS: Dementia    Immunization History  Administered Date(s) Administered  . Hepatitis A 11/15/2003, 06/11/2004, 12/25/2012  . Hepatitis B 11/14/2008, 12/15/2008  . Influenza Split 06/17/2011  . Influenza Whole 06/16/2010  . Influenza, High Dose Seasonal PF 05/27/2016, 07/15/2019  . Influenza,inj,Quad PF,6+ Mos 07/07/2018  . Influenza-Unspecified 07/10/2017  . Moderna SARS-COVID-2 Vaccination 09/21/2019, 10/19/2019  . Pneumococcal Conjugate-13 08/09/2014  . Pneumococcal Polysaccharide-23 04/22/2011, 11/15/2011  . Tdap 11/14/2008, 12/25/2012  . Typhoid Inactivated 12/25/2012  . Yellow Fever 12/25/2012  . Zoster 05/07/2012  . Zoster Recombinat (Shingrix) 01/03/2017, 04/04/2017   Pertinent  Health Maintenance Due  Topic Date  Due  . INFLUENZA VACCINE  04/16/2020  . PNA vac Low Risk Adult  Completed   Fall Risk  11/17/2018 01/21/2018 10/29/2016  Falls in the past year? 1 Yes Yes  Number falls in past yr: 1 2 or more 2 or more  Injury with Fall? 0 No Yes  Risk for fall due to : History of fall(s);Impaired mobility;Medication side effect;Impaired balance/gait;Mental status change - -  Follow up Falls evaluation completed;Education provided;Falls prevention discussed - Falls prevention discussed  Comment with staff at [facility]:    Vitals:   12/24/19 1035  Weight: 160 lb 12.8 oz (72.9 kg)   Body mass index is 29.41 kg/m. Physical Exam Vitals and nursing note reviewed.  Constitutional:      General: She is not in acute distress.    Appearance: She is not diaphoretic.     Comments: Asleep but arouses easily  HENT:     Head: Normocephalic and atraumatic.  Neck:     Vascular: No JVD.  Cardiovascular:     Rate and Rhythm: Normal rate and regular rhythm.     Heart sounds: No murmur.  Pulmonary:     Effort: Pulmonary effort is normal. No respiratory distress.     Breath sounds: Normal breath sounds. No wheezing.  Abdominal:     General: Bowel sounds are normal. There is no distension.     Palpations: Abdomen is soft.  Musculoskeletal:     Right lower leg: No edema.     Left lower leg: No edema.  Skin:    General: Skin is warm and dry.  Neurological:     General: No focal deficit present.     Mental Status: Mental status is at baseline.  Psychiatric:        Mood and Affect: Mood normal.     Labs reviewed: No results for input(s): NA, K, CL, CO2, GLUCOSE, BUN, CREATININE, CALCIUM, MG, PHOS in the last 8760 hours. No results for input(s): AST, ALT, ALKPHOS, BILITOT, PROT, ALBUMIN in the last 8760 hours. No results for input(s): WBC, NEUTROABS, HGB, HCT, MCV, PLT in the last 8760 hours. Lab Results  Component Value Date   TSH 1.93 02/01/2019   Lab Results    Component Value Date   HGBA1C 5.7 02/28/2018   Lab Results  Component Value Date   CHOL 183 11/14/2017   HDL 64 11/14/2017   LDLCALC 102 11/14/2017   TRIG 86 11/14/2017   CHOLHDL 2.9 07/15/2016    Significant Diagnostic Results in last 30 days:  No results found.  Assessment/Plan 1. Falls frequently Due to progressive weakness Staff have implemented a plan to help with fall prevention. She will monitored frequently and less likely to get up with the new tilted WC  2. Cerebral amyloid angiopathy (CODE) Which led to her chronic dementia and memory care  status.   3. Chronic dementia, with behavioral disturbance (HCC) Progressive over the past year with less verbalization and more weakness/falls Continue Zyprexa 5 mg qd and ativan 0.25 mg qd   4. Insomnia due to mental disorder No reported issues sleeping Continue trazodone 150 mg qhs   5. PBA (pseudobulbar affect) Occasional outburst of crying but this has improved over time.   6. Hyperparathyroidism, primary ([place]) Monitor Ca and PTH annually   7. Essential hypertension Controlled with aliskiren 75 mg qd      Family/ staff Communication: nurse  Labs/tests ordered: NA

## 2019-12-29 DIAGNOSIS — Z20828 Contact with and (suspected) exposure to other viral communicable diseases: Secondary | ICD-10-CM | POA: Diagnosis not present

## 2019-12-29 DIAGNOSIS — Z9189 Other specified personal risk factors, not elsewhere classified: Secondary | ICD-10-CM | POA: Diagnosis not present

## 2020-01-21 ENCOUNTER — Other Ambulatory Visit: Payer: Self-pay | Admitting: Adult Health

## 2020-01-21 DIAGNOSIS — F0391 Unspecified dementia with behavioral disturbance: Secondary | ICD-10-CM

## 2020-01-21 DIAGNOSIS — F03918 Unspecified dementia, unspecified severity, with other behavioral disturbance: Secondary | ICD-10-CM

## 2020-01-25 DIAGNOSIS — L602 Onychogryphosis: Secondary | ICD-10-CM | POA: Diagnosis not present

## 2020-01-25 DIAGNOSIS — Q6689 Other  specified congenital deformities of feet: Secondary | ICD-10-CM | POA: Diagnosis not present

## 2020-02-18 ENCOUNTER — Non-Acute Institutional Stay: Payer: Medicare Other | Admitting: Adult Health

## 2020-02-18 ENCOUNTER — Encounter: Payer: Self-pay | Admitting: Adult Health

## 2020-02-18 DIAGNOSIS — I10 Essential (primary) hypertension: Secondary | ICD-10-CM | POA: Diagnosis not present

## 2020-02-18 DIAGNOSIS — F03918 Unspecified dementia, unspecified severity, with other behavioral disturbance: Secondary | ICD-10-CM

## 2020-02-18 DIAGNOSIS — F0391 Unspecified dementia with behavioral disturbance: Secondary | ICD-10-CM | POA: Diagnosis not present

## 2020-02-18 DIAGNOSIS — F482 Pseudobulbar affect: Secondary | ICD-10-CM

## 2020-02-18 DIAGNOSIS — G459 Transient cerebral ischemic attack, unspecified: Secondary | ICD-10-CM

## 2020-02-18 DIAGNOSIS — F5105 Insomnia due to other mental disorder: Secondary | ICD-10-CM | POA: Diagnosis not present

## 2020-02-18 DIAGNOSIS — I680 Cerebral amyloid angiopathy: Secondary | ICD-10-CM | POA: Diagnosis not present

## 2020-02-18 DIAGNOSIS — E210 Primary hyperparathyroidism: Secondary | ICD-10-CM | POA: Diagnosis not present

## 2020-02-18 NOTE — Progress Notes (Signed)
Location:  Occupational psychologist of Service:  ALF (13) Provider:   Cindi Carbon, ANP [place] [phone]   Gayland Curry, DO  Patient Care Team: Gayland Curry, DO as PCP - General (Geriatric Medicine) Irine Seal, MD as Attending Physician (Urology) Jodi Marble, MD as Consulting Physician (Otolaryngology) Martinique, Amy, MD as Consulting Physician (Dermatology) Gerda Diss, DO as Consulting Physician (Family Medicine) Deneise Lever, MD as Consulting Physician (Pulmonary Disease) Arta Silence, MD as Consulting Physician (Gastroenterology) Dohmeier, Asencion Partridge, MD as Consulting Physician (Neurology) Geanie Kenning, MD as Consulting Physician (Psychiatry)  Extended Emergency Contact Information Primary Emergency Contact: [name] Address: [address]          [place], [place] [address] [name] of [place] Phone: [phone] Mobile Phone: [phone] Relation: Spouse  Code Status:  DNR Goals of care: Advanced Directive information Advanced Directives 10/19/2019  Does Patient Have a Medical Advance Directive? Yes  Type of Advance Directive Out of facility DNR (pink MOST or yellow form);Healthcare Power of Attorney  Does patient want to make changes to medical advance directive? No - Guardian declined  Copy of [place] in Chart? -  Would patient like information on creating a medical advance directive? -  Pre-existing out of facility DNR order (yellow form or pink MOST form) Pink MOST/Yellow Form most recent copy in chart - Physician notified to receive inpatient order     Chief Complaint  Patient presents with  . Medical Management of Chronic Issues    HPI:  Pt is a 79 y.o. female seen today for medical management of chronic diseases.   She resides in the memory care unit due to a hx of cerebral amyloid angiopathy that led to dementia. She continues with progressive weakness and now requires  a hoyer lift. She is on a regular diet but needs assistance with feeding. There are no issues with coughing or choking.  Minimally verbal at this point. The nurse reports that when compared to when she first arrived to the memory care unit she is much less anxious and has less crying spells. There are still occasional crying periods that are unprovoked but much less frequent. She is sleeping well. There are reports of resistance to care but no aggression towards staff.   Past Medical History:  Diagnosis Date  . Arthritis    back and neck  . Carotid artery stenosis   . Cerebral amyloid angiopathy (CODE) 06/20/2016  . Chronic rhinosinusitis   . DDD (degenerative disc disease)   . Depression   . Diverticulosis of colon   . Frequency of urination   . Glaucoma   . Headache disorder 12/13/2014   Right temple  . Hearing loss   . History of kidney stones    MULTIBLE SURGICAL INTERVENTIONS  . History of skin cancer HX TOPICAL FACIAL [place] 2013  . History of TIA (transient ischemic attack)    NOV 2013-- NO RESIDUAL  . Hypertension   . IBS (irritable bowel syndrome)   . Memory difficulty 05/14/2016  . Memory loss   . Mild asthma    COLD INDUCED  . Right ureteral stone   . Seasonal and perennial allergic rhinitis   . Shingles    EYEBALL 09-17-2013  . Spondylosis    Past Surgical History:  Procedure Laterality Date  . BUNIONECTOMY     BILATERAL  . CALDWELL LUC     sinus drainage procedure  . CYSTO/ BILATERAL URETEROSCOPIC  STONE EXTRACTIONS  02-09-2003  . CYSTO/ BILATERAL URETEROSCOPY/  LASER OF STONES LEFT SIDE  06-02-2007  . CYSTOSCOPY/RETROGRADE/URETEROSCOPY/STONE EXTRACTION WITH BASKET  03/31/2012   Procedure: CYSTOSCOPY/RETROGRADE/URETEROSCOPY/STONE EXTRACTION WITH BASKET;  Surgeon: Malka So, MD;  Location: [facility];  Service: Urology;  Laterality: Right;   . CYSTOSCOPY/RETROGRADE/URETEROSCOPY/STONE EXTRACTION WITH BASKET Right 03/29/2013   Procedure:  CYSTOSCOPY//URETEROSCOPY/STONE EXTRACTION WITH BASKET;  Surgeon: Malka So, MD;  [facility];  Service: Urology;  Laterality: Right;  . CYSTOSCOPY/RETROGRADE/URETEROSCOPY/STONE EXTRACTION WITH BASKET Right 10/14/2013   Procedure: CYSTOSCOPY/RETROGRADE/URETEROSCOPY/STONE EXTRACTION WITH BASKET;  Surgeon: Irine Seal, MD;  Location: [facility];  Service: Urology;  Laterality: Right;  . ESOPHAGOGASTRODUODENOSCOPY (EGD) WITH PROPOFOL N/A 04/12/2015   Procedure: ESOPHAGOGASTRODUODENOSCOPY (EGD) WITH PROPOFOL;  Surgeon: Arta Silence, MD;  [facility];  Service: Endoscopy;  Laterality: N/A;  . EUS N/A 04/12/2015   Procedure: ESOPHAGEAL ENDOSCOPIC ULTRASOUND (EUS) RADIAL;  Surgeon: Arta Silence, MD;  Location: [facility];  Service: Endoscopy;  Laterality: N/A;  . EXTRACORPOREAL SHOCK WAVE LITHOTRIPSY  about 6789/ Dr Reece Agar   Never completed procedure-could not tolerate pain. Had seizures due to pain  . EYE SURGERY  AGE 54   INJURY  . HERNIA REPAIR  AGE 7  . HOLMIUM LASER APPLICATION Right 3/81/0175   Procedure: HOLMIUM LASER APPLICATION;  Surgeon: Irine Seal, MD;  Location: [facility];  Service: Urology;  Laterality: Right;  . LAPAROSCOPIC CHOLECYSTECTOMY  01-12-2005  . LEFT URETEROSCOPIC STONE EXTRACTION    LAST ONE 04-28-2009   SEVERAL SURGICAL STONE EXTRACTIONS  . LUMBAR LAMINECTOMY  09-30-2002   L4 - 5;  SPONDYLOLISTHISES W/ STENOSIS AND RADICULOPATHY  . PERCUTANEOUS NEPHROSTOLITHOTOMY  1975  . RIGHT URETEROSCOPIC STONE EXTRACTION  LAST ONE 02-21-2011   MULTIPLE SURGICAL STONE EXTRACTIONS  . TONSILECTOMY, ADENOIDECTOMY, BILATERAL MYRINGOTOMY AND TUBES  CHILD  . VAGINAL HYSTERECTOMY  1991   W/ BILATERAL SALPINGOOPHECTOMY    Allergies  Allergen Reactions  . Donepezil Hcl   . Ace Inhibitors Cough  . Hctz [Hydrochlorothiazide]     Bladder problems  . Triptans     Avoid per neuro    Outpatient Encounter Medications as of 02/18/2020    Medication Sig  . aliskiren (TEKTURNA) 150 MG tablet Take 0.5 tablets (75 mg total) by mouth daily.  . Cranberry 400 MG CAPS Take 400 mg by mouth daily.  . DULoxetine (CYMBALTA) 60 MG capsule TAKE 1 CAPSULE BY MOUTH ONCE DAILY **DO NOT CRUSH*  . estradiol (ESTRACE) 1 MG tablet Take 1 mg by mouth every other day.   Marland Kitchen LORazepam (ATIVAN) 0.5 MG tablet Take 0.5 tablets (0.25 mg total) by mouth daily.  Marland Kitchen OLANZapine (ZYPREXA) 5 MG tablet Take 1 tablet by mouth daily at 6 PM.  . sennosides-docusate sodium (SENOKOT-S) 8.6-50 MG tablet Take 2 tablets by mouth at bedtime.  . traZODone (DESYREL) 150 MG tablet Take 150 mg by mouth at bedtime.    No facility-administered encounter medications on file as of 02/18/2020.    Review of Systems  Unable to perform ROS: Dementia    Immunization History  Administered Date(s) Administered  . Hepatitis A 11/15/2003, 06/11/2004, 12/25/2012  . Hepatitis B 11/14/2008, 12/15/2008  . Influenza Split 06/17/2011  . Influenza Whole 06/16/2010  . Influenza, High Dose Seasonal PF 05/27/2016, 07/15/2019  . Influenza,inj,Quad PF,6+ Mos 07/07/2018  . Influenza-Unspecified 07/10/2017  . Moderna SARS-COVID-2 Vaccination 09/21/2019, 10/19/2019  . Pneumococcal Conjugate-13 08/09/2014  . Pneumococcal Polysaccharide-23 04/22/2011, 11/15/2011  . Tdap 11/14/2008, 12/25/2012  .  Typhoid Inactivated 12/25/2012  . Yellow Fever 12/25/2012  . Zoster 05/07/2012  . Zoster Recombinat (Shingrix) 01/03/2017, 04/04/2017   Pertinent  Health Maintenance Due  Topic Date Due  . INFLUENZA VACCINE  04/16/2020  . PNA vac Low Risk Adult  Completed   Fall Risk  11/17/2018 01/21/2018 10/29/2016  Falls in the past year? 1 Yes Yes  Number falls in past yr: 1 2 or more 2 or more  Injury with Fall? 0 No Yes  Risk for fall due to : History of fall(s);Impaired mobility;Medication side effect;Impaired balance/gait;Mental status change - -  Follow up Falls evaluation completed;Education  provided;Falls prevention discussed - Falls prevention discussed  Comment with staff at [place] [facility]:    Vitals:   02/18/20 1106  Weight: 159 lb 3.2 oz (72.2 kg)   Body mass index is 29.12 kg/m. Physical Exam Vitals and nursing note reviewed.  Constitutional:      General: She is not in acute distress.    Appearance: She is not diaphoretic.  HENT:     Head: Normocephalic and atraumatic.  Neck:     Vascular: No carotid bruit or JVD.  Cardiovascular:     Rate and Rhythm: Normal rate and regular rhythm.     Heart sounds: No murmur.  Pulmonary:     Effort: Pulmonary effort is normal. No respiratory distress.     Breath sounds: Normal breath sounds. No wheezing.  Abdominal:     General: Bowel sounds are normal. There is no distension.     Palpations: Abdomen is soft.  Musculoskeletal:     Cervical back: No rigidity or tenderness.     Right lower leg: No edema.     Left lower leg: No edema.  Lymphadenopathy:     Cervical: No cervical adenopathy.  Skin:    General: Skin is warm and dry.  Neurological:     General: No focal deficit present.     Mental Status: She is alert. Mental status is at baseline.  Psychiatric:        Mood and Affect: Mood normal.     Labs reviewed: No results for input(s): NA, K, CL, CO2, GLUCOSE, BUN, CREATININE, CALCIUM, MG, PHOS in the last 8760 hours. No results for input(s): AST, ALT, ALKPHOS, BILITOT, PROT, ALBUMIN in the last 8760 hours. No results for input(s): WBC, NEUTROABS, HGB, HCT, MCV, PLT in the last 8760 hours. Lab Results  Component Value Date   TSH 1.93 02/01/2019   Lab Results  Component Value Date   HGBA1C 5.7 02/28/2018   Lab Results  Component Value Date   CHOL 183 11/14/2017   HDL 64 11/14/2017   LDLCALC 102 11/14/2017   TRIG 86 11/14/2017   CHOLHDL 2.9 07/15/2016    Significant Diagnostic Results in last 30 days:  No results found.  Assessment/Plan  1. Cerebral amyloid  angiopathy (CODE) With progressive dementia  2. Chronic dementia, with behavioral disturbance ([place]) Severe at this point.  Continues with periods of resistance to care. Overall mood is stable with view crying episodes. Will continue Zyprexa 5 mg qd, Ativan 0.25 qam (to help with am care)   3. Essential hypertension Controlled, continue tekturna 7 mg qd   4. TIA (transient ischemic attack) No longer taking baby aspirin due to goals of care and pill burden reduction   5. Hyperparathyroidism, primary ([place]) Labs ordered  6. PBA (pseudobulbar affect) Less crying episodes over the past year, however, given her resistance to personal  care will not taper current regimen  7. Insomnia due to mental disorder Continue trazodone 150 mg qhs  Family/ staff Communication: nurse  Labs/tests ordered: CBC BMP A1C intact PTH

## 2020-02-21 DIAGNOSIS — D649 Anemia, unspecified: Secondary | ICD-10-CM | POA: Diagnosis not present

## 2020-02-21 DIAGNOSIS — E119 Type 2 diabetes mellitus without complications: Secondary | ICD-10-CM | POA: Diagnosis not present

## 2020-02-21 DIAGNOSIS — I10 Essential (primary) hypertension: Secondary | ICD-10-CM | POA: Diagnosis not present

## 2020-02-21 DIAGNOSIS — R5383 Other fatigue: Secondary | ICD-10-CM | POA: Diagnosis not present

## 2020-02-21 DIAGNOSIS — E213 Hyperparathyroidism, unspecified: Secondary | ICD-10-CM | POA: Diagnosis not present

## 2020-02-21 LAB — HEMOGLOBIN A1C: Hemoglobin A1C: 5.3

## 2020-02-21 LAB — CBC AND DIFFERENTIAL
HCT: 42 (ref 36–46)
Hemoglobin: 14.1 (ref 12.0–16.0)
Neutrophils Absolute: 4
Platelets: 188 (ref 150–399)
WBC: 6.1

## 2020-02-21 LAB — CBC: RBC: 4.65 (ref 3.87–5.11)

## 2020-02-21 LAB — BASIC METABOLIC PANEL
BUN: 13 (ref 4–21)
CO2: 28 — AB (ref 13–22)
Chloride: 105 (ref 99–108)
Creatinine: 0.9 (ref 0.5–1.1)
Glucose: 75
Potassium: 4.3 (ref 3.4–5.3)
Sodium: 143 (ref 137–147)

## 2020-02-21 LAB — COMPREHENSIVE METABOLIC PANEL: Calcium: 9.3 (ref 8.7–10.7)

## 2020-05-01 DIAGNOSIS — Z9189 Other specified personal risk factors, not elsewhere classified: Secondary | ICD-10-CM | POA: Diagnosis not present

## 2020-05-08 DIAGNOSIS — Z20828 Contact with and (suspected) exposure to other viral communicable diseases: Secondary | ICD-10-CM | POA: Diagnosis not present

## 2020-05-08 DIAGNOSIS — Z9189 Other specified personal risk factors, not elsewhere classified: Secondary | ICD-10-CM | POA: Diagnosis not present

## 2020-05-11 ENCOUNTER — Encounter: Payer: Self-pay | Admitting: Adult Health

## 2020-05-11 ENCOUNTER — Non-Acute Institutional Stay: Payer: Medicare Other | Admitting: Adult Health

## 2020-05-11 DIAGNOSIS — I680 Cerebral amyloid angiopathy: Secondary | ICD-10-CM

## 2020-05-11 DIAGNOSIS — M8589 Other specified disorders of bone density and structure, multiple sites: Secondary | ICD-10-CM

## 2020-05-11 DIAGNOSIS — F03918 Unspecified dementia, unspecified severity, with other behavioral disturbance: Secondary | ICD-10-CM

## 2020-05-11 DIAGNOSIS — F5105 Insomnia due to other mental disorder: Secondary | ICD-10-CM | POA: Diagnosis not present

## 2020-05-11 DIAGNOSIS — I10 Essential (primary) hypertension: Secondary | ICD-10-CM | POA: Diagnosis not present

## 2020-05-11 DIAGNOSIS — F0391 Unspecified dementia with behavioral disturbance: Secondary | ICD-10-CM

## 2020-05-11 DIAGNOSIS — F482 Pseudobulbar affect: Secondary | ICD-10-CM | POA: Diagnosis not present

## 2020-05-11 NOTE — Progress Notes (Signed)
Location:  Occupational psychologist of Service:  ALF (13) Provider:   Cindi Carbon, ANP [place] [phone]  Gayland Curry, DO  Patient Care Team: Gayland Curry, DO as PCP - General (Geriatric Medicine) Irine Seal, MD as Attending Physician (Urology) Jodi Marble, MD as Consulting Physician (Otolaryngology) Martinique, Amy, MD as Consulting Physician (Dermatology) Gerda Diss, DO as Consulting Physician (Family Medicine) Deneise Lever, MD as Consulting Physician (Pulmonary Disease) Arta Silence, MD as Consulting Physician (Gastroenterology) Dohmeier, Asencion Partridge, MD as Consulting Physician (Neurology) Geanie Kenning, MD as Consulting Physician (Psychiatry)  Extended Emergency Contact Information Primary Emergency Contact: [name] Address: [address]          [place], [place] [address] [name] of [place] Phone: [phone] Mobile Phone: [phone] Relation: Spouse  Code Status:  DNR Goals of care: Advanced Directive information Advanced Directives 10/19/2019  Does Patient Have a Medical Advance Directive? Yes  Type of Advance Directive Out of facility DNR (pink MOST or yellow form);Healthcare Power of Attorney  Does patient want to make changes to medical advance directive? No - Guardian declined  Copy of [place] in Chart? -  Would patient like information on creating a medical advance directive? -  Pre-existing out of facility DNR order (yellow form or pink MOST form) Pink MOST/Yellow Form most recent copy in chart - Physician notified to receive inpatient order     Chief Complaint  Patient presents with  . Medical Management of Chronic Issues    HPI:  Pt is a 79 y.o. female seen today for medical management of chronic diseases.  Ms. Voshell has a hx of cerebral amyloid angiopathy with associated dementia. Over the past few years she has gradually declined. She now requires  assistance with all ADLs including feeding. She has a hx of PBA but this has improved over the past year with few emotional outbursts. She was tapered from trazodone recently and there are no reports of insomnia. For my visit today she is sitting in her supportive chair and appears comfortable. The nurse has no acute complaints for her. Vitals are reviewed and WNL. She has a hx of osteopenia and is on estrogen therapy.   Functional status: incontinent, hoyer lift  Past Medical History:  Diagnosis Date  . Arthritis    back and neck  . Carotid artery stenosis   . Cerebral amyloid angiopathy (CODE) 06/20/2016  . Chronic rhinosinusitis   . DDD (degenerative disc disease)   . Depression   . Diverticulosis of colon   . Frequency of urination   . Glaucoma   . Headache disorder 12/13/2014   Right temple  . Hearing loss   . History of kidney stones    MULTIBLE SURGICAL INTERVENTIONS  . History of skin cancer HX TOPICAL FACIAL [place] 2013  . History of TIA (transient ischemic attack)    NOV 2013-- NO RESIDUAL  . Hypertension   . IBS (irritable bowel syndrome)   . Memory difficulty 05/14/2016  . Memory loss   . Mild asthma    COLD INDUCED  . Right ureteral stone   . Seasonal and perennial allergic rhinitis   . Shingles    EYEBALL 09-17-2013  . Spondylosis    Past Surgical History:  Procedure Laterality Date  . BUNIONECTOMY     BILATERAL  . CALDWELL LUC     sinus drainage procedure  . CYSTO/ BILATERAL URETEROSCOPIC STONE EXTRACTIONS  02-09-2003  . CYSTO/  BILATERAL URETEROSCOPY/  LASER OF STONES LEFT SIDE  06-02-2007  . CYSTOSCOPY/RETROGRADE/URETEROSCOPY/STONE EXTRACTION WITH BASKET  03/31/2012   Procedure: CYSTOSCOPY/RETROGRADE/URETEROSCOPY/STONE EXTRACTION WITH BASKET;  Surgeon: Malka So, MD;  Location: [facility];  Service: Urology;  Laterality: Right;   . CYSTOSCOPY/RETROGRADE/URETEROSCOPY/STONE EXTRACTION WITH BASKET Right 03/29/2013   Procedure:  CYSTOSCOPY//URETEROSCOPY/STONE EXTRACTION WITH BASKET;  Surgeon: Malka So, MD;  Location: [facility];  Service: Urology;  Laterality: Right;  . CYSTOSCOPY/RETROGRADE/URETEROSCOPY/STONE EXTRACTION WITH BASKET Right 10/14/2013   Procedure: CYSTOSCOPY/RETROGRADE/URETEROSCOPY/STONE EXTRACTION WITH BASKET;  Surgeon: Irine Seal, MD;  Location: [facility];  Service: Urology;  Laterality: Right;  . ESOPHAGOGASTRODUODENOSCOPY (EGD) WITH PROPOFOL N/A 04/12/2015   Procedure: ESOPHAGOGASTRODUODENOSCOPY (EGD) WITH PROPOFOL;  Surgeon: Arta Silence, MD;  Location: [facility];  Service: Endoscopy;  Laterality: N/A;  . EUS N/A 04/12/2015   Procedure: ESOPHAGEAL ENDOSCOPIC ULTRASOUND (EUS) RADIAL;  Surgeon: Arta Silence, MD;  Location: [facility];  Service: Endoscopy;  Laterality: N/A;  . EXTRACORPOREAL SHOCK WAVE LITHOTRIPSY  about 4097/ Dr Reece Agar   Never completed procedure-could not tolerate pain. Had seizures due to pain  . EYE SURGERY  AGE 89   INJURY  . HERNIA REPAIR  AGE 21  . HOLMIUM LASER APPLICATION Right 3/53/2992   Procedure: HOLMIUM LASER APPLICATION;  Surgeon: Irine Seal, MD;  Location: [facility];  Service: Urology;  Laterality: Right;  . LAPAROSCOPIC CHOLECYSTECTOMY  01-12-2005  . LEFT URETEROSCOPIC STONE EXTRACTION    LAST ONE 04-28-2009   SEVERAL SURGICAL STONE EXTRACTIONS  . LUMBAR LAMINECTOMY  09-30-2002   L4 - 5;  SPONDYLOLISTHISES W/ STENOSIS AND RADICULOPATHY  . PERCUTANEOUS NEPHROSTOLITHOTOMY  1975  . RIGHT URETEROSCOPIC STONE EXTRACTION  LAST ONE 02-21-2011   MULTIPLE SURGICAL STONE EXTRACTIONS  . TONSILECTOMY, ADENOIDECTOMY, BILATERAL MYRINGOTOMY AND TUBES  CHILD  . VAGINAL HYSTERECTOMY  1991   W/ BILATERAL SALPINGOOPHECTOMY    Allergies  Allergen Reactions  . Donepezil Hcl   . Ace Inhibitors Cough  . Hctz [Hydrochlorothiazide]     Bladder problems  . Triptans     Avoid per neuro    Outpatient Encounter Medications as of 05/11/2020    Medication Sig  . LORazepam (ATIVAN) 0.5 MG tablet Take 0.5 tablets (0.25 mg total) by mouth daily.  Marland Kitchen OLANZapine (ZYPREXA) 5 MG tablet Take 1 tablet by mouth daily at 6 PM.  . sennosides-docusate sodium (SENOKOT-S) 8.6-50 MG tablet Take 2 tablets by mouth at bedtime.  Marland Kitchen aliskiren (TEKTURNA) 150 MG tablet Take 0.5 tablets (75 mg total) by mouth daily.  . DULoxetine (CYMBALTA) 60 MG capsule TAKE 1 CAPSULE BY MOUTH ONCE DAILY **DO NOT CRUSH*  . estradiol (ESTRACE) 1 MG tablet Take 1 mg by mouth every other day.   . [DISCONTINUED] Cranberry 400 MG CAPS Take 400 mg by mouth daily.  . [DISCONTINUED] traZODone (DESYREL) 150 MG tablet Take 150 mg by mouth at bedtime.    No facility-administered encounter medications on file as of 05/11/2020.    Review of Systems  Unable to perform ROS: Dementia    Immunization History  Administered Date(s) Administered  . Hepatitis A 11/15/2003, 06/11/2004, 12/25/2012  . Hepatitis B 11/14/2008, 12/15/2008  . Influenza Split 06/17/2011  . Influenza Whole 06/16/2010  . Influenza, High Dose Seasonal PF 05/27/2016, 07/15/2019  . Influenza,inj,Quad PF,6+ Mos 07/07/2018  . Influenza-Unspecified 07/10/2017  . Moderna SARS-COVID-2 Vaccination 09/21/2019, 10/19/2019  . Pneumococcal Conjugate-13 08/09/2014  . Pneumococcal Polysaccharide-23 04/22/2011, 11/15/2011  . Tdap 11/14/2008, 12/25/2012  . Typhoid Inactivated 12/25/2012  .  Yellow Fever 12/25/2012  . Zoster 05/07/2012  . Zoster Recombinat (Shingrix) 01/03/2017, 04/04/2017   Pertinent  Health Maintenance Due  Topic Date Due  . INFLUENZA VACCINE  04/16/2020  . PNA vac Low Risk Adult  Completed   Fall Risk  11/17/2018 01/21/2018 10/29/2016  Falls in the past year? 1 Yes Yes  Number falls in past yr: 1 2 or more 2 or more  Injury with Fall? 0 No Yes  Risk for fall due to : History of fall(s);Impaired mobility;Medication side effect;Impaired balance/gait;Mental status change - -  Follow up Falls evaluation  completed;Education provided;Falls prevention discussed - Falls prevention discussed  Comment with staff at [facility]:    Vitals:   05/11/20 1621  Weight: 161 lb 9.6 oz (73.3 kg)   Body mass index is 29.56 kg/m. Physical Exam Vitals and nursing note reviewed.  Constitutional:      General: She is not in acute distress.    Appearance: She is not diaphoretic.  HENT:     Head: Normocephalic and atraumatic.     Mouth/Throat:     Comments: Bit on tongue blade and resisted exam Eyes:     General:        Right eye: No discharge.        Left eye: No discharge.     Conjunctiva/sclera: Conjunctivae normal.     Pupils: Pupils are equal, round, and reactive to light.  Neck:     Vascular: No carotid bruit or JVD.  Cardiovascular:     Rate and Rhythm: Normal rate and regular rhythm.     Heart sounds: No murmur heard.   Pulmonary:     Effort: Pulmonary effort is normal. No respiratory distress.     Breath sounds: Normal breath sounds. No wheezing.  Abdominal:     General: Bowel sounds are normal. There is no distension.     Palpations: Abdomen is soft.  Musculoskeletal:     Cervical back: No rigidity or tenderness.     Right lower leg: No edema.     Left lower leg: No edema.  Lymphadenopathy:     Cervical: No cervical adenopathy.  Skin:    General: Skin is warm and dry.  Neurological:     General: No focal deficit present.     Mental Status: She is alert. Mental status is at baseline.     Comments: Alert, smiles. Not able to answer q's or f/c.    Psychiatric:        Mood and Affect: Mood normal.     Labs reviewed: Recent Labs    02/21/20 0000  NA 143  K 4.3  CL 105  CO2 28*  BUN 13  CREATININE 0.9  CALCIUM 9.3   No results for input(s): AST, ALT, ALKPHOS, BILITOT, PROT, ALBUMIN in the last 8760 hours. Recent Labs    02/21/20 0000  WBC 6.1  NEUTROABS 4  HGB 14.1  HCT 42  PLT 188   Lab Results  Component Value Date   TSH  1.93 02/01/2019   Lab Results  Component Value Date   HGBA1C 5.3 02/21/2020   Lab Results  Component Value Date   CHOL 183 11/14/2017   HDL 64 11/14/2017   LDLCALC 102 11/14/2017   TRIG 86 11/14/2017   CHOLHDL 2.9 07/15/2016    Significant Diagnostic Results in last 30 days:  No results found.  Assessment/Plan 1. Osteopenia of multiple sites She is no longer ambulatory and  therefore not likely benefiting from estrogen therapy. Decrease Estrace to 1 mg q 3 days x 2 weeks, then q weekly x 2 weeks, then d/c  2. Chronic dementia, with behavioral disturbance (HCC) Severe  Requires assistance for all ADLs Contiue Zyprexa at 5 mg qd, as well ativan 0.25 mg qd   3. Cerebral amyloid angiopathy (CODE) Led to #2  4. Essential hypertension Controlled Continue tekturna 75 mg qd   5. PBA (pseudobulbar affect) Over time symptoms of emotional outbursts have taper. Could consider tapering Ativan further at next visit   6. Insomnia due to mental disorder Tolerated trazodone taper well.     Family/ staff Communication: discussed with the nruse  Labs/tests ordered: NA

## 2020-05-26 DIAGNOSIS — Z20828 Contact with and (suspected) exposure to other viral communicable diseases: Secondary | ICD-10-CM | POA: Diagnosis not present

## 2020-05-26 DIAGNOSIS — Z9189 Other specified personal risk factors, not elsewhere classified: Secondary | ICD-10-CM | POA: Diagnosis not present

## 2020-06-01 DIAGNOSIS — Z9189 Other specified personal risk factors, not elsewhere classified: Secondary | ICD-10-CM | POA: Diagnosis not present

## 2020-06-01 DIAGNOSIS — Z20828 Contact with and (suspected) exposure to other viral communicable diseases: Secondary | ICD-10-CM | POA: Diagnosis not present

## 2020-06-05 DIAGNOSIS — Z20828 Contact with and (suspected) exposure to other viral communicable diseases: Secondary | ICD-10-CM | POA: Diagnosis not present

## 2020-06-05 DIAGNOSIS — Z9189 Other specified personal risk factors, not elsewhere classified: Secondary | ICD-10-CM | POA: Diagnosis not present

## 2020-06-12 DIAGNOSIS — Z20828 Contact with and (suspected) exposure to other viral communicable diseases: Secondary | ICD-10-CM | POA: Diagnosis not present

## 2020-06-12 DIAGNOSIS — Z9189 Other specified personal risk factors, not elsewhere classified: Secondary | ICD-10-CM | POA: Diagnosis not present

## 2020-06-22 ENCOUNTER — Other Ambulatory Visit: Payer: Self-pay | Admitting: Adult Health

## 2020-06-22 DIAGNOSIS — F03918 Unspecified dementia, unspecified severity, with other behavioral disturbance: Secondary | ICD-10-CM

## 2020-06-22 MED ORDER — LORAZEPAM 0.5 MG PO TABS: 0.2500 mg | ORAL_TABLET | Freq: Every day | ORAL | 5 refills | Status: DC

## 2020-07-07 DIAGNOSIS — Z23 Encounter for immunization: Secondary | ICD-10-CM | POA: Diagnosis not present

## 2020-07-26 DIAGNOSIS — Z23 Encounter for immunization: Secondary | ICD-10-CM | POA: Diagnosis not present

## 2020-07-27 ENCOUNTER — Non-Acute Institutional Stay: Payer: Medicare Other | Admitting: Adult Health

## 2020-07-27 ENCOUNTER — Encounter: Payer: Self-pay | Admitting: Adult Health

## 2020-07-27 DIAGNOSIS — F0391 Unspecified dementia with behavioral disturbance: Secondary | ICD-10-CM

## 2020-07-27 DIAGNOSIS — F5105 Insomnia due to other mental disorder: Secondary | ICD-10-CM | POA: Diagnosis not present

## 2020-07-27 DIAGNOSIS — F482 Pseudobulbar affect: Secondary | ICD-10-CM

## 2020-07-27 DIAGNOSIS — E210 Primary hyperparathyroidism: Secondary | ICD-10-CM | POA: Diagnosis not present

## 2020-07-27 DIAGNOSIS — I10 Essential (primary) hypertension: Secondary | ICD-10-CM | POA: Diagnosis not present

## 2020-07-27 DIAGNOSIS — I680 Cerebral amyloid angiopathy: Secondary | ICD-10-CM

## 2020-07-27 DIAGNOSIS — F03918 Unspecified dementia, unspecified severity, with other behavioral disturbance: Secondary | ICD-10-CM

## 2020-07-27 NOTE — Progress Notes (Signed)
Location:  Occupational psychologist of Service:  ALF (13) Provider:   Cindi Carbon, ANP [place] [phone]  Gayland Curry, DO  Patient Care Team: Gayland Curry, DO as PCP - General (Geriatric Medicine) Irine Seal, MD as Attending Physician (Urology) Jodi Marble, MD as Consulting Physician (Otolaryngology) Martinique, Amy, MD as Consulting Physician (Dermatology) Arta Silence, MD as Consulting Physician (Gastroenterology) Dohmeier, Asencion Partridge, MD as Consulting Physician (Neurology) Geanie Kenning, MD as Consulting Physician (Psychiatry)  Extended Emergency Contact Information Primary Emergency Contact: [name] Address: [address]          [place], [place] [address] [name] of [place] Phone: [phone] Mobile Phone: [phone] Relation: Spouse  Code Status:  DNR Goals of care: Advanced Directive information Advanced Directives 07/27/2020  Does Patient Have a Medical Advance Directive? Yes  Type of Paramedic of [place];Living will;Out of facility DNR (pink MOST or yellow form)  Does patient want to make changes to medical advance directive? -  Copy of [place] in Chart? Yes - validated most recent copy scanned in chart (See row information)  Would patient like information on creating a medical advance directive? -  Pre-existing out of facility DNR order (yellow form or pink MOST form) -     Chief Complaint  Patient presents with  . Medical Management of Chronic Issues    HPI:  Pt is a 79 y.o. female seen today for medical management of chronic diseases.    Cerebral amyloid angiopathy with dementia: not able to complete MMSE. Minimally verbal. Requires assistance for all ADLs. No periods of crying or agitation but does grimace and appears sad at times. No verbalization of depression or anxiety. No hallucinations.   BP controlled  No issues swallowing. Tolerating a  regular diet well.   Weight fairly stable with adequate intake.  Wt Readings from Last 3 Encounters:  07/27/20 158 lb 12.8 oz (72 kg)  05/11/20 161 lb 9.6 oz (73.3 kg)  02/18/20 159 lb 3.2 oz (72.2 kg)   Hyperparathyroidism: Ca 9.3 in June. PTH needs to be abstracted. No current issues.   Functional status: incontinent, hoyer lift  Past Medical History:  Diagnosis Date  . Arthritis    back and neck  . Carotid artery stenosis   . Cerebral amyloid angiopathy (CODE) 06/20/2016  . Chronic rhinosinusitis   . DDD (degenerative disc disease)   . Depression   . Diverticulosis of colon   . Frequency of urination   . Glaucoma   . Headache disorder 12/13/2014   Right temple  . Hearing loss   . History of kidney stones    MULTIBLE SURGICAL INTERVENTIONS  . History of skin cancer HX TOPICAL FACIAL [place] 2013  . History of TIA (transient ischemic attack)    NOV 2013-- NO RESIDUAL  . Hypertension   . IBS (irritable bowel syndrome)   . Memory difficulty 05/14/2016  . Memory loss   . Mild asthma    COLD INDUCED  . Right ureteral stone   . Seasonal and perennial allergic rhinitis   . Shingles    EYEBALL 09-17-2013  . Spondylosis    Past Surgical History:  Procedure Laterality Date  . BUNIONECTOMY     BILATERAL  . CALDWELL LUC     sinus drainage procedure  . CYSTO/ BILATERAL URETEROSCOPIC STONE EXTRACTIONS  02-09-2003  . CYSTO/ BILATERAL URETEROSCOPY/  LASER OF STONES LEFT SIDE  06-02-2007  . CYSTOSCOPY/RETROGRADE/URETEROSCOPY/STONE EXTRACTION  WITH BASKET  03/31/2012   Procedure: CYSTOSCOPY/RETROGRADE/URETEROSCOPY/STONE EXTRACTION WITH BASKET;  Surgeon: Malka So, MD;  Location: [facility];  Service: Urology;  Laterality: Right;   . CYSTOSCOPY/RETROGRADE/URETEROSCOPY/STONE EXTRACTION WITH BASKET Right 03/29/2013   Procedure: CYSTOSCOPY//URETEROSCOPY/STONE EXTRACTION WITH BASKET;  Surgeon: Malka So, MD;  Location: [facility];  Service: Urology;  Laterality: Right;   . CYSTOSCOPY/RETROGRADE/URETEROSCOPY/STONE EXTRACTION WITH BASKET Right 10/14/2013   Procedure: CYSTOSCOPY/RETROGRADE/URETEROSCOPY/STONE EXTRACTION WITH BASKET;  Surgeon: Irine Seal, MD;  [facility];  Service: Urology;  Laterality: Right;  . ESOPHAGOGASTRODUODENOSCOPY (EGD) WITH PROPOFOL N/A 04/12/2015   Procedure: ESOPHAGOGASTRODUODENOSCOPY (EGD) WITH PROPOFOL;  Surgeon: Arta Silence, MD;  Location: [facility];  Service: Endoscopy;  Laterality: N/A;  . EUS N/A 04/12/2015   Procedure: ESOPHAGEAL ENDOSCOPIC ULTRASOUND (EUS) RADIAL;  Surgeon: Arta Silence, MD;  Location: [facility];  Service: Endoscopy;  Laterality: N/A;  . EXTRACORPOREAL SHOCK WAVE LITHOTRIPSY  about 1751/ Dr Reece Agar   Never completed procedure-could not tolerate pain. Had seizures due to pain  . EYE SURGERY  AGE 78   INJURY  . HERNIA REPAIR  AGE 58  . HOLMIUM LASER APPLICATION Right 0/25/8527   Procedure: HOLMIUM LASER APPLICATION;  Surgeon: Irine Seal, MD;  Location: [facility];  Service: Urology;  Laterality: Right;  . LAPAROSCOPIC CHOLECYSTECTOMY  01-12-2005  . LEFT URETEROSCOPIC STONE EXTRACTION    LAST ONE 04-28-2009   SEVERAL SURGICAL STONE EXTRACTIONS  . LUMBAR LAMINECTOMY  09-30-2002   L4 - 5;  SPONDYLOLISTHISES W/ STENOSIS AND RADICULOPATHY  . PERCUTANEOUS NEPHROSTOLITHOTOMY  1975  . RIGHT URETEROSCOPIC STONE EXTRACTION  LAST ONE 02-21-2011   MULTIPLE SURGICAL STONE EXTRACTIONS  . TONSILECTOMY, ADENOIDECTOMY, BILATERAL MYRINGOTOMY AND TUBES  CHILD  . VAGINAL HYSTERECTOMY  1991   W/ BILATERAL SALPINGOOPHECTOMY    Allergies  Allergen Reactions  . Donepezil Hcl   . Ace Inhibitors Cough  . Hctz [Hydrochlorothiazide]     Bladder problems  . Triptans     Avoid per neuro    Outpatient Encounter Medications as of 07/27/2020  Medication Sig  . LORazepam (ATIVAN) 0.5 MG tablet Take 0.5 tablets (0.25 mg total) by mouth daily.  Marland Kitchen OLANZapine (ZYPREXA) 5 MG tablet  Take 1 tablet by mouth daily at 6 PM.  . sennosides-docusate sodium (SENOKOT-S) 8.6-50 MG tablet Take 2 tablets by mouth at bedtime.  Marland Kitchen aliskiren (TEKTURNA) 150 MG tablet Take 0.5 tablets (75 mg total) by mouth daily.  . DULoxetine (CYMBALTA) 60 MG capsule TAKE 1 CAPSULE BY MOUTH ONCE DAILY **DO NOT CRUSH*  . [DISCONTINUED] estradiol (ESTRACE) 1 MG tablet Take 1 mg by mouth every other day.    No facility-administered encounter medications on file as of 07/27/2020.    Review of Systems  Unable to perform ROS: Dementia    Immunization History  Administered Date(s) Administered  . Hepatitis A 11/15/2003, 06/11/2004, 12/25/2012  . Hepatitis B 11/14/2008, 12/15/2008  . Influenza Split 06/17/2011  . Influenza Whole 06/16/2010  . Influenza, High Dose Seasonal PF 05/27/2016, 07/15/2019  . Influenza,inj,Quad PF,6+ Mos 07/07/2018  . Influenza-Unspecified 07/10/2017  . Moderna SARS-COVID-2 Vaccination 09/21/2019, 10/19/2019  . Pneumococcal Conjugate-13 08/09/2014  . Pneumococcal Polysaccharide-23 04/22/2011, 11/15/2011  . Tdap 11/14/2008, 12/25/2012  . Typhoid Inactivated 12/25/2012  . Yellow Fever 12/25/2012  . Zoster 05/07/2012  . Zoster Recombinat (Shingrix) 01/03/2017, 04/04/2017   Pertinent  Health Maintenance Due  Topic Date Due  . INFLUENZA VACCINE  04/16/2020  . PNA vac Low Risk Adult  Completed  Fall Risk  11/17/2018 01/21/2018 10/29/2016  Falls in the past year? 1 Yes Yes  Number falls in past yr: 1 2 or more 2 or more  Injury with Fall? 0 No Yes  Risk for fall due to : History of fall(s);Impaired mobility;Medication side effect;Impaired balance/gait;Mental status change - -  Follow up Falls evaluation completed;Education provided;Falls prevention discussed - Falls prevention discussed  Comment with staff at [facility]:    Vitals:   07/27/20 1616  Weight: 158 lb 12.8 oz (72 kg)   Body mass index is 29.04 kg/m. Physical Exam Vitals and  nursing note reviewed.  Constitutional:      General: She is not in acute distress.    Appearance: She is not diaphoretic.  HENT:     Head: Normocephalic and atraumatic.     Mouth/Throat:     Comments: Bit on tongue blade and resisted exam Eyes:     General:        Right eye: No discharge.        Left eye: No discharge.     Conjunctiva/sclera: Conjunctivae normal.     Pupils: Pupils are equal, round, and reactive to light.  Neck:     Vascular: No carotid bruit or JVD.  Cardiovascular:     Rate and Rhythm: Normal rate and regular rhythm.     Heart sounds: No murmur heard.   Pulmonary:     Effort: Pulmonary effort is normal. No respiratory distress.     Breath sounds: Normal breath sounds. No wheezing.  Abdominal:     General: Bowel sounds are normal. There is no distension.     Palpations: Abdomen is soft.  Musculoskeletal:     Cervical back: No rigidity or tenderness.     Right lower leg: No edema.     Left lower leg: No edema.  Lymphadenopathy:     Cervical: No cervical adenopathy.  Skin:    General: Skin is warm and dry.  Neurological:     General: No focal deficit present.     Mental Status: She is alert. Mental status is at baseline.     Comments: Alert grimaces during the visit. Mild tremor in the left hand. Mild rigidity noted to BUE  Psychiatric:        Mood and Affect: Mood normal.     Labs reviewed: Recent Labs    02/21/20 0000  NA 143  K 4.3  CL 105  CO2 28*  BUN 13  CREATININE 0.9  CALCIUM 9.3   No results for input(s): AST, ALT, ALKPHOS, BILITOT, PROT, ALBUMIN in the last 8760 hours. Recent Labs    02/21/20 0000  WBC 6.1  NEUTROABS 4  HGB 14.1  HCT 42  PLT 188   Lab Results  Component Value Date   TSH 1.93 02/01/2019   Lab Results  Component Value Date   HGBA1C 5.3 02/21/2020   Lab Results  Component Value Date   CHOL 183 11/14/2017   HDL 64 11/14/2017   LDLCALC 102 11/14/2017   TRIG 86 11/14/2017   CHOLHDL 2.9 07/15/2016     Significant Diagnostic Results in last 30 days:  No results found.  Assessment/Plan 1. Cerebral amyloid angiopathy (CODE) With associated dementia  2. Chronic dementia, with behavioral disturbance (HCC) Severe nearing end stage.  Has maintained her weight and no dysphagia at this time Some occasional periods of sadness, would not taper medications at this time  3. Insomnia due to  mental disorder No issues.   4. PBA (pseudobulbar affect) No emotional outbursts noted. See #2  5. Hyperparathyroidism, primary ([place]) Ca acceptable, continue to monitor annually  6. Essential hypertension Controlled Continue Tekturna 75 mg qd    Family/ staff Communication: discussed with the nruse  Labs/tests ordered: NA

## 2020-09-21 ENCOUNTER — Non-Acute Institutional Stay: Payer: Medicare Other | Admitting: Adult Health

## 2020-09-21 DIAGNOSIS — F03918 Unspecified dementia, unspecified severity, with other behavioral disturbance: Secondary | ICD-10-CM

## 2020-09-21 DIAGNOSIS — K5901 Slow transit constipation: Secondary | ICD-10-CM

## 2020-09-21 DIAGNOSIS — F482 Pseudobulbar affect: Secondary | ICD-10-CM

## 2020-09-21 DIAGNOSIS — E210 Primary hyperparathyroidism: Secondary | ICD-10-CM

## 2020-09-21 DIAGNOSIS — I10 Essential (primary) hypertension: Secondary | ICD-10-CM | POA: Diagnosis not present

## 2020-09-21 DIAGNOSIS — I680 Cerebral amyloid angiopathy: Secondary | ICD-10-CM | POA: Diagnosis not present

## 2020-09-21 DIAGNOSIS — F0391 Unspecified dementia with behavioral disturbance: Secondary | ICD-10-CM

## 2020-09-22 ENCOUNTER — Encounter: Payer: Self-pay | Admitting: Adult Health

## 2020-09-22 DIAGNOSIS — K5901 Slow transit constipation: Secondary | ICD-10-CM | POA: Insufficient documentation

## 2020-09-22 NOTE — Progress Notes (Signed)
Location:  Occupational psychologist of Service:  ALF (13) Provider:   Cindi Carbon, ANP [place] [phone]  Gayland Curry, DO  Patient Care Team: Gayland Curry, DO as PCP - General (Geriatric Medicine) Irine Seal, MD as Attending Physician (Urology) Jodi Marble, MD as Consulting Physician (Otolaryngology) Martinique, Amy, MD as Consulting Physician (Dermatology) Arta Silence, MD as Consulting Physician (Gastroenterology) Dohmeier, Asencion Partridge, MD as Consulting Physician (Neurology) Geanie Kenning, MD as Consulting Physician (Psychiatry)  Extended Emergency Contact Information Primary Emergency Contact: [name] Address: [address]          [place], [place] [address] [name] of [place] Phone: [phone] Mobile Phone: [phone] Relation: Spouse  Code Status:  DNR Goals of care: Advanced Directive information Advanced Directives 07/27/2020  Does Patient Have a Medical Advance Directive? Yes  Type of Paramedic of [place];Living will;Out of facility DNR (pink MOST or yellow form)  Does patient want to make changes to medical advance directive? -  Copy of [place] in Chart? Yes - validated most recent copy scanned in chart (See row information)  Would patient like information on creating a medical advance directive? -  Pre-existing out of facility DNR order (yellow form or pink MOST form) -     Chief Complaint  Patient presents with  . Medical Management of Chronic Issues    HPI:  Pt is a 80 y.o. female seen today for medical management of chronic diseases.    The nurse reports she is not having any behavioral concerns except for an occasional crying episode due to hx of PBA. She has lost 6 lbs in the past 5 months. No reported issues swallowing but is resistant to swallowing pills.  No reports of issues sleeping.   BP controlled  Hyperparathyroidism: 02/21/20 Ca 9.3  PTH 75  Functional status: incontinent, hoyer lift  Past Medical History:  Diagnosis Date  . Arthritis    back and neck  . Carotid artery stenosis   . Cerebral amyloid angiopathy (CODE) 06/20/2016  . Chronic rhinosinusitis   . DDD (degenerative disc disease)   . Depression   . Diverticulosis of colon   . Frequency of urination   . Glaucoma   . Headache disorder 12/13/2014   Right temple  . Hearing loss   . History of kidney stones    MULTIBLE SURGICAL INTERVENTIONS  . History of skin cancer HX TOPICAL FACIAL [place] 2013  . History of TIA (transient ischemic attack)    NOV 2013-- NO RESIDUAL  . Hypertension   . IBS (irritable bowel syndrome)   . Memory difficulty 05/14/2016  . Memory loss   . Mild asthma    COLD INDUCED  . Right ureteral stone   . Seasonal and perennial allergic rhinitis   . Shingles    EYEBALL 09-17-2013  . Spondylosis    Past Surgical History:  Procedure Laterality Date  . BUNIONECTOMY     BILATERAL  . CALDWELL LUC     sinus drainage procedure  . CYSTO/ BILATERAL URETEROSCOPIC STONE EXTRACTIONS  02-09-2003  . CYSTO/ BILATERAL URETEROSCOPY/  LASER OF STONES LEFT SIDE  06-02-2007  . CYSTOSCOPY/RETROGRADE/URETEROSCOPY/STONE EXTRACTION WITH BASKET  03/31/2012   Procedure: CYSTOSCOPY/RETROGRADE/URETEROSCOPY/STONE EXTRACTION WITH BASKET;  Surgeon: Malka So, MD;  [facility];  Service: Urology;  Laterality: Right;   . CYSTOSCOPY/RETROGRADE/URETEROSCOPY/STONE EXTRACTION WITH BASKET Right 03/29/2013   Procedure: CYSTOSCOPY//URETEROSCOPY/STONE EXTRACTION WITH BASKET;  Surgeon: Marshall Cork  Jeffie Pollock, MD;  Location: [facility];  Service: Urology;  Laterality: Right;  . CYSTOSCOPY/RETROGRADE/URETEROSCOPY/STONE EXTRACTION WITH BASKET Right 10/14/2013   Procedure: CYSTOSCOPY/RETROGRADE/URETEROSCOPY/STONE EXTRACTION WITH BASKET;  Surgeon: Irine Seal, MD;  Location[facility];  Service: Urology;  Laterality: Right;  .  ESOPHAGOGASTRODUODENOSCOPY (EGD) WITH PROPOFOL N/A 04/12/2015   Procedure: ESOPHAGOGASTRODUODENOSCOPY (EGD) WITH PROPOFOL;  Surgeon: Arta Silence, MD;  [facility];  Service: Endoscopy;  Laterality: N/A;  . EUS N/A 04/12/2015   Procedure: ESOPHAGEAL ENDOSCOPIC ULTRASOUND (EUS) RADIAL;  Surgeon: Arta Silence, MD;  [facility];  Service: Endoscopy;  Laterality: N/A;  . EXTRACORPOREAL SHOCK WAVE LITHOTRIPSY  about [id number] Dr Reece Agar   Never completed procedure-could not tolerate pain. Had seizures due to pain  . EYE SURGERY  AGE 89   INJURY  . HERNIA REPAIR  AGE 41  . HOLMIUM LASER APPLICATION Right [id number]   Procedure: HOLMIUM LASER APPLICATION;  Surgeon: Irine Seal, MD;  Location: [facility];  Service: Urology;  Laterality: Right;  . LAPAROSCOPIC CHOLECYSTECTOMY  01-12-2005  . LEFT URETEROSCOPIC STONE EXTRACTION    LAST ONE 04-28-2009   SEVERAL SURGICAL STONE EXTRACTIONS  . LUMBAR LAMINECTOMY  09-30-2002   L4 - 5;  SPONDYLOLISTHISES W/ STENOSIS AND RADICULOPATHY  . PERCUTANEOUS NEPHROSTOLITHOTOMY  1975  . RIGHT URETEROSCOPIC STONE EXTRACTION  LAST ONE 02-21-2011   MULTIPLE SURGICAL STONE EXTRACTIONS  . TONSILECTOMY, ADENOIDECTOMY, BILATERAL MYRINGOTOMY AND TUBES  CHILD  . VAGINAL HYSTERECTOMY  1991   W/ BILATERAL SALPINGOOPHECTOMY    Allergies  Allergen Reactions  . Donepezil Hcl   . Ace Inhibitors Cough  . Hctz [Hydrochlorothiazide]     Bladder problems  . Triptans     Avoid per neuro    Outpatient Encounter Medications as of 09/21/2020  Medication Sig  . aliskiren (TEKTURNA) 150 MG tablet Take 0.5 tablets (75 mg total) by mouth daily.  . DULoxetine (CYMBALTA) 60 MG capsule TAKE 1 CAPSULE BY MOUTH ONCE DAILY **DO NOT CRUSH*  . LORazepam (ATIVAN) 0.5 MG tablet Take 0.5 tablets (0.25 mg total) by mouth daily.  Marland Kitchen OLANZapine (ZYPREXA) 5 MG tablet Take 1 tablet by mouth daily at 6 PM.  . sennosides-docusate sodium (SENOKOT-S) 8.6-50 MG tablet  Take 2 tablets by mouth at bedtime.   No facility-administered encounter medications on file as of 09/21/2020.    Review of Systems  Unable to perform ROS: Dementia    Immunization History  Administered Date(s) Administered  . Hepatitis A 11/15/2003, 06/11/2004, 12/25/2012  . Hepatitis B 11/14/2008, 12/15/2008  . Influenza Split 06/17/2011  . Influenza Whole 06/16/2010  . Influenza, High Dose Seasonal PF 05/27/2016, 07/15/2019  . Influenza,inj,Quad PF,6+ Mos 07/07/2018  . Influenza-Unspecified 07/10/2017  . Moderna Sars-Covid-2 Vaccination 09/21/2019, 10/19/2019  . Pneumococcal Conjugate-13 08/09/2014  . Pneumococcal Polysaccharide-23 04/22/2011, 11/15/2011  . Tdap 11/14/2008, 12/25/2012  . Typhoid Inactivated 12/25/2012  . Yellow Fever 12/25/2012  . Zoster 05/07/2012  . Zoster Recombinat (Shingrix) 01/03/2017, 04/04/2017   Pertinent  Health Maintenance Due  Topic Date Due  . INFLUENZA VACCINE  04/16/2020  . PNA vac Low Risk Adult  Completed   Fall Risk  11/17/2018 01/21/2018 10/29/2016  Falls in the past year? 1 Yes Yes  Number falls in past yr: 1 2 or more 2 or more  Injury with Fall? 0 No Yes  Risk for fall due to : History of fall(s);Impaired mobility;Medication side effect;Impaired balance/gait;Mental status change - -  Follow up Falls evaluation completed;Education provided;Falls prevention discussed - Falls prevention discussed  Comment with staff at [facility]:    Vitals:   09/22/20 0841  Weight: 155 lb (70.3 kg)   Body mass index is 28.35 kg/m.  Wt Readings from Last 3 Encounters:  09/22/20 155 lb (70.3 kg)  07/27/20 158 lb 12.8 oz (72 kg)  05/11/20 161 lb 9.6 oz (73.3 kg)    Physical Exam Vitals and nursing note reviewed.  Constitutional:      General: She is not in acute distress.    Appearance: She is not diaphoretic.  HENT:     Head: Normocephalic and atraumatic.  Eyes:     General:        Right eye: No discharge.         Left eye: No discharge.     Conjunctiva/sclera: Conjunctivae normal.     Pupils: Pupils are equal, round, and reactive to light.  Neck:     Vascular: No carotid bruit or JVD.  Cardiovascular:     Rate and Rhythm: Normal rate and regular rhythm.     Heart sounds: No murmur heard.   Pulmonary:     Effort: Pulmonary effort is normal. No respiratory distress.     Breath sounds: Normal breath sounds. No wheezing.  Abdominal:     General: Bowel sounds are normal. There is no distension.     Palpations: Abdomen is soft.  Musculoskeletal:     Cervical back: No rigidity or tenderness.     Right lower leg: No edema.     Left lower leg: No edema.  Lymphadenopathy:     Cervical: No cervical adenopathy.  Skin:    General: Skin is warm and dry.  Neurological:     General: No focal deficit present.     Mental Status: She is alert. Mental status is at baseline.     Comments: Alert not answering questions. No obvious focal deficit  Psychiatric:        Mood and Affect: Mood normal.     Labs reviewed: Recent Labs    02/21/20 0000  NA 143  K 4.3  CL 105  CO2 28*  BUN 13  CREATININE 0.9  CALCIUM 9.3   No results for input(s): AST, ALT, ALKPHOS, BILITOT, PROT, ALBUMIN in the last 8760 hours. Recent Labs    02/21/20 0000  WBC 6.1  NEUTROABS 4  HGB 14.1  HCT 42  PLT 188   Lab Results  Component Value Date   TSH 1.93 02/01/2019   Lab Results  Component Value Date   HGBA1C 5.3 02/21/2020   Lab Results  Component Value Date   CHOL 183 11/14/2017   HDL 64 11/14/2017   LDLCALC 102 11/14/2017   TRIG 86 11/14/2017   CHOLHDL 2.9 07/15/2016    Significant Diagnostic Results in last 30 days:  No results found.  Assessment/Plan  1. Hyperparathyroidism, primary ([place]) 02/21/20 Ca 9.3 PTH 75 Labs monitored annually   2. Cerebral amyloid angiopathy (CODE) Led to #3   3. Chronic dementia, with behavioral disturbance (HCC) Severe, now with weight loss.  Will attempt  to taper ativan 0.25 mg qod x 2 weeks then d/c  4. Essential hypertension Controlled Continues on Tekturna   5. PBA (pseudobulbar affect) Has rare crying spells per nursing staff  6. Constipation  Continue senokot s 2 tabs qhs   Family/ staff Communication: discussed with her husband Jenny Reichmann and nurse Danielle  Labs/tests ordered: NA

## 2020-11-06 ENCOUNTER — Encounter: Payer: Self-pay | Admitting: Internal Medicine

## 2021-01-05 ENCOUNTER — Non-Acute Institutional Stay (SKILLED_NURSING_FACILITY): Payer: Medicare Other | Admitting: Adult Health

## 2021-01-05 ENCOUNTER — Encounter: Payer: Self-pay | Admitting: Adult Health

## 2021-01-05 DIAGNOSIS — F5105 Insomnia due to other mental disorder: Secondary | ICD-10-CM

## 2021-01-05 DIAGNOSIS — F0391 Unspecified dementia with behavioral disturbance: Secondary | ICD-10-CM | POA: Diagnosis not present

## 2021-01-05 DIAGNOSIS — E210 Primary hyperparathyroidism: Secondary | ICD-10-CM | POA: Diagnosis not present

## 2021-01-05 DIAGNOSIS — I10 Essential (primary) hypertension: Secondary | ICD-10-CM | POA: Diagnosis not present

## 2021-01-05 DIAGNOSIS — F03918 Unspecified dementia, unspecified severity, with other behavioral disturbance: Secondary | ICD-10-CM

## 2021-01-05 DIAGNOSIS — F482 Pseudobulbar affect: Secondary | ICD-10-CM

## 2021-01-05 DIAGNOSIS — I680 Cerebral amyloid angiopathy: Secondary | ICD-10-CM

## 2021-01-05 NOTE — Progress Notes (Signed)
Location:  Occupational psychologist of Service:  SNF (31) Provider:  Cindi Carbon, ANP [place] [phone]   Gayland Curry, DO  Patient Care Team: Gayland Curry, DO as PCP - General (Geriatric Medicine) Irine Seal, MD as Attending Physician (Urology) Jodi Marble, MD as Consulting Physician (Otolaryngology) Martinique, Amy, MD as Consulting Physician (Dermatology) Arta Silence, MD as Consulting Physician (Gastroenterology) Dohmeier, Asencion Partridge, MD as Consulting Physician (Neurology) Geanie Kenning, MD as Consulting Physician (Psychiatry)  Extended Emergency Contact Information Primary Emergency Contact: [name] Address: [address]          [place], [place] [address] [name] of [place] Phone: [phone] Mobile Phone: [phone] Relation: Spouse  Code Status:  DNR Goals of care: Advanced Directive information Advanced Directives 01/05/2021  Does Patient Have a Medical Advance Directive? Yes  Type of Paramedic of [place];Living will;Out of facility DNR (pink MOST or yellow form)  Does patient want to make changes to medical advance directive? -  Copy of [place] in Chart? Yes - validated most recent copy scanned in chart (See row information)  Would patient like information on creating a medical advance directive? -  Pre-existing out of facility DNR order (yellow form or pink MOST form) Yellow form placed in chart (order not valid for inpatient use);Pink MOST form placed in chart (order not valid for inpatient use)     Chief Complaint  Patient presents with  . Medical Management of Chronic Issues    HPI:  Pt is a 80 y.o. female seen today for medical management of chronic diseases.   PMH significant for cerebral amyloid angiopathy with dementia, hyperparathyroidism, PBA, HTN, renal stones, depression, glaucoma, TIA, spondylosis, and constipation.  DNR in place and goals  of care are comfort based Just moved to skilled care from memory care this week and seems to be adjusting.  She requires assistance for all ADLs, has incontinence, and is minimally verbal. In the past she had frequent unprovoked crying episodes but this has dissipated. She mostly [facility] the [facility] with eyes open but not engaged. No reports of pain. Vitals are stable. Recently changed to puree due to ease of feeding as she would close her mouth.   Past Medical History:  Diagnosis Date  . Arthritis    back and neck  . Carotid artery stenosis   . Cerebral amyloid angiopathy (CODE) 06/20/2016  . Chronic rhinosinusitis   . DDD (degenerative disc disease)   . Depression   . Diverticulosis of colon   . Frequency of urination   . Glaucoma   . Headache disorder 12/13/2014   Right temple  . Hearing loss   . History of kidney stones    MULTIBLE SURGICAL INTERVENTIONS  . History of skin cancer HX TOPICAL FACIAL [place] 2013  . History of TIA (transient ischemic attack)    NOV 2013-- NO RESIDUAL  . Hypertension   . IBS (irritable bowel syndrome)   . Memory difficulty 05/14/2016  . Memory loss   . Mild asthma    COLD INDUCED  . Right ureteral stone   . Seasonal and perennial allergic rhinitis   . Shingles    EYEBALL 09-17-2013  . Spondylosis    Past Surgical History:  Procedure Laterality Date  . BUNIONECTOMY     BILATERAL  . CALDWELL LUC     sinus drainage procedure  . CYSTO/ BILATERAL URETEROSCOPIC STONE EXTRACTIONS  02-09-2003  . CYSTO/  BILATERAL URETEROSCOPY/  LASER OF STONES LEFT SIDE  06-02-2007  . CYSTOSCOPY/RETROGRADE/URETEROSCOPY/STONE EXTRACTION WITH BASKET  03/31/2012   Procedure: CYSTOSCOPY/RETROGRADE/URETEROSCOPY/STONE EXTRACTION WITH BASKET;  Surgeon: Malka So, MD;  Location[facility];  Service: Urology;  Laterality: Right;   . CYSTOSCOPY/RETROGRADE/URETEROSCOPY/STONE EXTRACTION WITH BASKET Right 03/29/2013   Procedure:  CYSTOSCOPY//URETEROSCOPY/STONE EXTRACTION WITH BASKET;  Surgeon: Malka So, MD;  [facility];  Service: Urology;  Laterality: Right;  . CYSTOSCOPY/RETROGRADE/URETEROSCOPY/STONE EXTRACTION WITH BASKET Right 10/14/2013   Procedure: CYSTOSCOPY/RETROGRADE/URETEROSCOPY/STONE EXTRACTION WITH BASKET;  Surgeon: Irine Seal, MD;  Location[facility];  Service: Urology;  Laterality: Right;  . ESOPHAGOGASTRODUODENOSCOPY (EGD) WITH PROPOFOL N/A 04/12/2015   Procedure: ESOPHAGOGASTRODUODENOSCOPY (EGD) WITH PROPOFOL;  Surgeon: Arta Silence, MD;  [facility];  Service: Endoscopy;  Laterality: N/A;  . EUS N/A 04/12/2015   Procedure: ESOPHAGEAL ENDOSCOPIC ULTRASOUND (EUS) RADIAL;  Surgeon: Arta Silence, MD;  [facility];  Service: Endoscopy;  Laterality: N/A;  . EXTRACORPOREAL SHOCK WAVE LITHOTRIPSY  about [id number] Dr Reece Agar   Never completed procedure-could not tolerate pain. Had seizures due to pain  . EYE SURGERY  AGE 89   INJURY  . HERNIA REPAIR  AGE 41  . HOLMIUM LASER APPLICATION Right [id number]   Procedure: HOLMIUM LASER APPLICATION;  Surgeon: Irine Seal, MD;  Location: [facility];  Service: Urology;  Laterality: Right;  . LAPAROSCOPIC CHOLECYSTECTOMY  01-12-2005  . LEFT URETEROSCOPIC STONE EXTRACTION    LAST ONE 04-28-2009   SEVERAL SURGICAL STONE EXTRACTIONS  . LUMBAR LAMINECTOMY  09-30-2002   L4 - 5;  SPONDYLOLISTHISES W/ STENOSIS AND RADICULOPATHY  . PERCUTANEOUS NEPHROSTOLITHOTOMY  1975  . RIGHT URETEROSCOPIC STONE EXTRACTION  LAST ONE 02-21-2011   MULTIPLE SURGICAL STONE EXTRACTIONS  . TONSILECTOMY, ADENOIDECTOMY, BILATERAL MYRINGOTOMY AND TUBES  CHILD  . VAGINAL HYSTERECTOMY  1991   W/ BILATERAL SALPINGOOPHECTOMY    Allergies  Allergen Reactions  . Donepezil Hcl   . Ace Inhibitors Cough  . Hctz [Hydrochlorothiazide]     Bladder problems  . Triptans     Avoid per neuro    Outpatient Encounter Medications as of 01/05/2021   Medication Sig  . aliskiren (TEKTURNA) 150 MG tablet Take 0.5 tablets (75 mg total) by mouth daily.  . DULoxetine (CYMBALTA) 60 MG capsule TAKE 1 CAPSULE BY MOUTH ONCE DAILY **DO NOT CRUSH*  . LORazepam (ATIVAN) 0.5 MG tablet Take 0.5 tablets (0.25 mg total) by mouth daily.  Marland Kitchen OLANZapine (ZYPREXA) 5 MG tablet Take 1 tablet by mouth daily at 6 PM.  . sennosides-docusate sodium (SENOKOT-S) 8.6-50 MG tablet Take 2 tablets by mouth at bedtime.   No facility-administered encounter medications on file as of 01/05/2021.    Review of Systems  Unable to perform ROS: Dementia    Immunization History  Administered Date(s) Administered  . Hepatitis A 11/15/2003, 06/11/2004, 12/25/2012  . Hepatitis B 11/14/2008, 12/15/2008  . Influenza Split 06/17/2011  . Influenza Whole 06/16/2010  . Influenza, High Dose Seasonal PF 05/27/2016, 07/15/2019  . Influenza,inj,Quad PF,6+ Mos 07/07/2018  . Influenza-Unspecified 07/10/2017  . Moderna Sars-Covid-2 Vaccination 09/21/2019, 10/19/2019  . Pneumococcal Conjugate-13 08/09/2014  . Pneumococcal Polysaccharide-23 04/22/2011, 11/15/2011  . Tdap 11/14/2008, 12/25/2012  . Typhoid Inactivated 12/25/2012  . Yellow Fever 12/25/2012  . Zoster 05/07/2012  . Zoster Recombinat (Shingrix) 01/03/2017, 04/04/2017   Pertinent  Health Maintenance Due  Topic Date Due  . INFLUENZA VACCINE  04/16/2021  . PNA vac Low Risk Adult  Completed   Fall Risk  11/17/2018 01/21/2018 10/29/2016  Falls in the past year? 1 Yes Yes  Number falls in past yr: 1 2 or more 2 or more  Injury with Fall? 0 No Yes  Risk for fall due to : History of fall(s);Impaired mobility;Medication side effect;Impaired balance/gait;Mental status change - -  Follow up Falls evaluation completed;Education provided;Falls prevention discussed - Falls prevention discussed  Comment with staff at [facility] - -   Functional Status Survey:    Vitals:   01/05/21 1031  Weight: 150 lb (68 kg)   Body mass index  is 27.44 kg/m. Physical Exam Vitals and nursing note reviewed.  Constitutional:      General: She is not in acute distress.    Appearance: She is not diaphoretic.  HENT:     Head: Normocephalic and atraumatic.     Nose: Nose normal. No congestion.     Mouth/Throat:     Comments: refused Eyes:     General:        Right eye: No discharge.        Left eye: No discharge.     Conjunctiva/sclera: Conjunctivae normal.     Pupils: Pupils are equal, round, and reactive to light.  Neck:     Vascular: No JVD.  Cardiovascular:     Rate and Rhythm: Normal rate and regular rhythm.     Heart sounds: No murmur heard.   Pulmonary:     Effort: Pulmonary effort is normal. No respiratory distress.     Breath sounds: Normal breath sounds. No wheezing.  Abdominal:     General: Bowel sounds are normal. There is no distension.     Palpations: Abdomen is soft.     Tenderness: There is no abdominal tenderness.  Musculoskeletal:     Right lower leg: No edema.     Left lower leg: No edema.     Comments: Decreased ROM with rigidity to BUE and BLE  Skin:    General: Skin is warm and dry.  Neurological:     General: No focal deficit present.     Mental Status: She is alert. Mental status is at baseline.  Psychiatric:     Comments: Flat does not follow commands,      Labs reviewed: Recent Labs    02/21/20 0000  NA 143  K 4.3  CL 105  CO2 28*  BUN 13  CREATININE 0.9  CALCIUM 9.3   No results for input(s): AST, ALT, ALKPHOS, BILITOT, PROT, ALBUMIN in the last 8760 hours. Recent Labs    02/21/20 0000  WBC 6.1  NEUTROABS 4  HGB 14.1  HCT 42  PLT 188   Lab Results  Component Value Date   TSH 1.93 02/01/2019   Lab Results  Component Value Date   HGBA1C 5.3 02/21/2020   Lab Results  Component Value Date   CHOL 183 11/14/2017   HDL 64 11/14/2017   LDLCALC 102 11/14/2017   TRIG 86 11/14/2017   CHOLHDL 2.9 07/15/2016    Significant Diagnostic Results in last 30 days:  No  results found.  Assessment/Plan  1. Cerebral amyloid angiopathy (CODE) Led to #2  2. Chronic dementia, with behavioral disturbance (HCC) Adjusting well to skilled care but she has only been there 2 days. After one month would consider tapering Zyprexa   3. PBA (pseudobulbar affect) Not currently on issue  Will monitor if tapering Zyprexa at the next visit.   4. Insomnia due to mental disorder No reported issues.   5. Hyperparathyroidism,  primary ([place]) 02/21/20 Intact PTH 75.16 CA 9.3 Monitor annually   6. Essential hypertension Controlled Continue Tekturna 75 mg qd   Labs/tests ordered:  Labs checked annually in June

## 2021-01-22 ENCOUNTER — Non-Acute Institutional Stay (SKILLED_NURSING_FACILITY): Payer: Medicare Other | Admitting: Adult Health

## 2021-01-22 ENCOUNTER — Encounter: Payer: Self-pay | Admitting: Adult Health

## 2021-01-22 DIAGNOSIS — B3731 Acute candidiasis of vulva and vagina: Secondary | ICD-10-CM

## 2021-01-22 DIAGNOSIS — B373 Candidiasis of vulva and vagina: Secondary | ICD-10-CM | POA: Diagnosis not present

## 2021-01-22 DIAGNOSIS — R8281 Pyuria: Secondary | ICD-10-CM

## 2021-01-22 NOTE — Progress Notes (Signed)
Location:  Occupational psychologist of Service:  SNF (31) Provider:   Cindi Carbon, ANP [place] [phone]   Gayland Curry, DO  Patient Care Team: Gayland Curry, DO as PCP - General (Geriatric Medicine) Irine Seal, MD as Attending Physician (Urology) Jodi Marble, MD as Consulting Physician (Otolaryngology) Martinique, Amy, MD as Consulting Physician (Dermatology) Arta Silence, MD as Consulting Physician (Gastroenterology) Dohmeier, Asencion Partridge, MD as Consulting Physician (Neurology) Geanie Kenning, MD as Consulting Physician (Psychiatry)  Extended Emergency Contact Information Primary Emergency Contact: [name] Address: [address]          [place], [place] [address] [name] of [place] Phone: [phone] Mobile Phone: [phone] Relation: Spouse  Code Status: DNR Goals of care: Advanced Directive information Advanced Directives 01/05/2021  Does Patient Have a Medical Advance Directive? Yes  Type of Paramedic of [place];Living will;Out of facility DNR (pink MOST or yellow form)  Does patient want to make changes to medical advance directive? -  Copy of [place] in Chart? Yes - validated most recent copy scanned in chart (See row information)  Would patient like information on creating a medical advance directive? -  Pre-existing out of facility DNR order (yellow form or pink MOST form) Yellow form placed in chart (order not valid for inpatient use);Pink MOST form placed in chart (order not valid for inpatient use)     Chief Complaint  Patient presents with  . Acute Visit    Vaginal irritation, brown discharge    HPI:  Pt is a 80 y.o. female seen today for an acute visit for brown discharge, vaginal irritation, and foul odor.  Symptoms present on 4/25 and she was given 1 dose of Diflucan, unclear if things improved as she has dementia and can not contribute to the  hx.  Symptoms present again 5/8. Pt has a hx of UTI and renal stones.  No fever, vomiting, or other symptoms but pt can not verbalize.      Past Medical History:  Diagnosis Date  . Arthritis    back and neck  . Carotid artery stenosis   . Cerebral amyloid angiopathy (CODE) 06/20/2016  . Chronic rhinosinusitis   . DDD (degenerative disc disease)   . Depression   . Diverticulosis of colon   . Frequency of urination   . Glaucoma   . Headache disorder 12/13/2014   Right temple  . Hearing loss   . History of kidney stones    MULTIBLE SURGICAL INTERVENTIONS  . History of skin cancer HX TOPICAL FACIAL [place] 2013  . History of TIA (transient ischemic attack)    NOV 2013-- NO RESIDUAL  . Hypertension   . IBS (irritable bowel syndrome)   . Memory difficulty 05/14/2016  . Memory loss   . Mild asthma    COLD INDUCED  . Right ureteral stone   . Seasonal and perennial allergic rhinitis   . Shingles    EYEBALL 09-17-2013  . Spondylosis    Past Surgical History:  Procedure Laterality Date  . BUNIONECTOMY     BILATERAL  . CALDWELL LUC     sinus drainage procedure  . CYSTO/ BILATERAL URETEROSCOPIC STONE EXTRACTIONS  02-09-2003  . CYSTO/ BILATERAL URETEROSCOPY/  LASER OF STONES LEFT SIDE  06-02-2007  . CYSTOSCOPY/RETROGRADE/URETEROSCOPY/STONE EXTRACTION WITH BASKET  03/31/2012   Procedure: CYSTOSCOPY/RETROGRADE/URETEROSCOPY/STONE EXTRACTION WITH BASKET;  Surgeon: Malka So, MD;  Location: [facility];  Service: Urology;  Laterality: Right;   . CYSTOSCOPY/RETROGRADE/URETEROSCOPY/STONE EXTRACTION WITH BASKET Right 03/29/2013   Procedure: CYSTOSCOPY//URETEROSCOPY/STONE EXTRACTION WITH BASKET;  Surgeon: Malka So, MD;  Location: [facility];  Service: Urology;  Laterality: Right;  . CYSTOSCOPY/RETROGRADE/URETEROSCOPY/STONE EXTRACTION WITH BASKET Right 10/14/2013   Procedure: CYSTOSCOPY/RETROGRADE/URETEROSCOPY/STONE EXTRACTION WITH BASKET;  Surgeon: Irine Seal, MD;   Location: [facility];  Service: Urology;  Laterality: Right;  . ESOPHAGOGASTRODUODENOSCOPY (EGD) WITH PROPOFOL N/A 04/12/2015   Procedure: ESOPHAGOGASTRODUODENOSCOPY (EGD) WITH PROPOFOL;  Surgeon: Arta Silence, MD;  [facility];  Service: Endoscopy;  Laterality: N/A;  . EUS N/A 04/12/2015   Procedure: ESOPHAGEAL ENDOSCOPIC ULTRASOUND (EUS) RADIAL;  Surgeon: Arta Silence, MD;  [facility];  Service: Endoscopy;  Laterality: N/A;  . EXTRACORPOREAL SHOCK WAVE LITHOTRIPSY  about 6010/ Dr Reece Agar   Never completed procedure-could not tolerate pain. Had seizures due to pain  . EYE SURGERY  AGE 70   INJURY  . HERNIA REPAIR  AGE 3  . HOLMIUM LASER APPLICATION Right 9/32/3557   Procedure: HOLMIUM LASER APPLICATION;  Surgeon: Irine Seal, MD;  [facility];  Service: Urology;  Laterality: Right;  . LAPAROSCOPIC CHOLECYSTECTOMY  01-12-2005  . LEFT URETEROSCOPIC STONE EXTRACTION    LAST ONE 04-28-2009   SEVERAL SURGICAL STONE EXTRACTIONS  . LUMBAR LAMINECTOMY  09-30-2002   L4 - 5;  SPONDYLOLISTHISES W/ STENOSIS AND RADICULOPATHY  . PERCUTANEOUS NEPHROSTOLITHOTOMY  1975  . RIGHT URETEROSCOPIC STONE EXTRACTION  LAST ONE 02-21-2011   MULTIPLE SURGICAL STONE EXTRACTIONS  . TONSILECTOMY, ADENOIDECTOMY, BILATERAL MYRINGOTOMY AND TUBES  CHILD  . VAGINAL HYSTERECTOMY  1991   W/ BILATERAL SALPINGOOPHECTOMY    Allergies  Allergen Reactions  . Donepezil Hcl   . Ace Inhibitors Cough  . Hctz [Hydrochlorothiazide]     Bladder problems  . Triptans     Avoid per neuro    Outpatient Encounter Medications as of 01/22/2021  Medication Sig  . aliskiren (TEKTURNA) 150 MG tablet Take 0.5 tablets (75 mg total) by mouth daily.  . DULoxetine (CYMBALTA) 60 MG capsule TAKE 1 CAPSULE BY MOUTH ONCE DAILY **DO NOT CRUSH*  . OLANZapine (ZYPREXA) 5 MG tablet Take 1 tablet by mouth daily at 6 PM.  . sennosides-docusate sodium (SENOKOT-S) 8.6-50 MG tablet Take  2 tablets by mouth at bedtime.   No facility-administered encounter medications on file as of 01/22/2021.    Review of Systems  Unable to perform ROS: Dementia    Immunization History  Administered Date(s) Administered  . Hepatitis A 11/15/2003, 06/11/2004, 12/25/2012  . Hepatitis B 11/14/2008, 12/15/2008  . Influenza Split 06/17/2011  . Influenza Whole 06/16/2010  . Influenza, High Dose Seasonal PF 05/27/2016, 07/15/2019  . Influenza,inj,Quad PF,6+ Mos 07/07/2018  . Influenza-Unspecified 07/10/2017  . Moderna Sars-Covid-2 Vaccination 09/21/2019, 10/19/2019  . Pneumococcal Conjugate-13 08/09/2014  . Pneumococcal Polysaccharide-23 04/22/2011, 11/15/2011  . Tdap 11/14/2008, 12/25/2012  . Typhoid Inactivated 12/25/2012  . Yellow Fever 12/25/2012  . Zoster 05/07/2012  . Zoster Recombinat (Shingrix) 01/03/2017, 04/04/2017   Pertinent  Health Maintenance Due  Topic Date Due  . INFLUENZA VACCINE  04/16/2021  . PNA vac Low Risk Adult  Completed   Fall Risk  11/17/2018 01/21/2018 10/29/2016  Falls in the past year? 1 Yes Yes  Number falls in past yr: 1 2 or more 2 or more  Injury with Fall? 0 No Yes  Risk for fall due to : History of fall(s);Impaired mobility;Medication side effect;Impaired balance/gait;Mental status change - -  Follow up Falls evaluation completed;Education provided;Falls  prevention discussed - Falls prevention discussed  Comment with staff at [facility]:    Vitals:   01/22/21 1001  BP: 130/78  Pulse: 70  Resp: 20  Temp: 97.7 F (36.5 C)   There is no height or weight on file to calculate BMI. Physical Exam Vitals and nursing note reviewed.  Constitutional:      General: She is not in acute distress.    Appearance: She is not diaphoretic.  HENT:     Head: Normocephalic and atraumatic.  Neck:     Vascular: No JVD.  Cardiovascular:     Rate and Rhythm: Normal rate and regular rhythm.     Heart sounds: No murmur  heard.   Pulmonary:     Effort: Pulmonary effort is normal. No respiratory distress.     Breath sounds: Normal breath sounds. No wheezing.  Abdominal:     General: Bowel sounds are normal. There is no distension.     Palpations: Abdomen is soft.     Tenderness: There is no abdominal tenderness. There is no right CVA tenderness or left CVA tenderness.  Skin:    General: Skin is warm and dry.  Neurological:     General: No focal deficit present.     Mental Status: She is alert. Mental status is at baseline.     Labs reviewed: Recent Labs    02/21/20 0000  NA 143  K 4.3  CL 105  CO2 28*  BUN 13  CREATININE 0.9  CALCIUM 9.3   No results for input(s): AST, ALT, ALKPHOS, BILITOT, PROT, ALBUMIN in the last 8760 hours. Recent Labs    02/21/20 0000  WBC 6.1  NEUTROABS 4  HGB 14.1  HCT 42  PLT 188   Lab Results  Component Value Date   TSH 1.93 02/01/2019   Lab Results  Component Value Date   HGBA1C 5.3 02/21/2020   Lab Results  Component Value Date   CHOL 183 11/14/2017   HDL 64 11/14/2017   LDLCALC 102 11/14/2017   TRIG 86 11/14/2017   CHOLHDL 2.9 07/15/2016    Significant Diagnostic Results in last 30 days:  No results found.  Assessment/Plan  1. Vaginal candidiasis Monostat 7 qhs for 7 days  2. Urine purulent Check UA C and S  Family/ staff Communication: nurse  Labs/tests ordered:  UA C and S

## 2021-01-23 ENCOUNTER — Encounter: Payer: Self-pay | Admitting: Internal Medicine

## 2021-01-23 DIAGNOSIS — N390 Urinary tract infection, site not specified: Secondary | ICD-10-CM | POA: Diagnosis not present

## 2021-02-08 ENCOUNTER — Encounter: Payer: Self-pay | Admitting: Adult Health

## 2021-02-08 ENCOUNTER — Non-Acute Institutional Stay (SKILLED_NURSING_FACILITY): Payer: Medicare Other | Admitting: Adult Health

## 2021-02-08 DIAGNOSIS — K5901 Slow transit constipation: Secondary | ICD-10-CM

## 2021-02-08 DIAGNOSIS — Z66 Do not resuscitate: Secondary | ICD-10-CM

## 2021-02-08 DIAGNOSIS — R1312 Dysphagia, oropharyngeal phase: Secondary | ICD-10-CM | POA: Diagnosis not present

## 2021-02-08 DIAGNOSIS — F0391 Unspecified dementia with behavioral disturbance: Secondary | ICD-10-CM | POA: Diagnosis not present

## 2021-02-08 DIAGNOSIS — F482 Pseudobulbar affect: Secondary | ICD-10-CM

## 2021-02-08 DIAGNOSIS — I10 Essential (primary) hypertension: Secondary | ICD-10-CM | POA: Diagnosis not present

## 2021-02-08 DIAGNOSIS — F03918 Unspecified dementia, unspecified severity, with other behavioral disturbance: Secondary | ICD-10-CM

## 2021-02-08 NOTE — Progress Notes (Signed)
Location:  [facility] Room Number: 125-A Place of Service:  SNF (31) Provider:   Royal Hawthorn, NP  Patient Care Team: Royal Hawthorn, NP as PCP - General (Nurse Practitioner) Irine Seal, MD as Attending Physician (Urology) Jodi Marble, MD as Consulting Physician (Otolaryngology) Martinique, Amy, MD as Consulting Physician (Dermatology) Arta Silence, MD as Consulting Physician (Gastroenterology) Dohmeier, Asencion Partridge, MD as Consulting Physician (Neurology) Geanie Kenning, MD as Consulting Physician (Psychiatry)  Extended Emergency Contact Information Primary Emergency Contact: [name] Address: [address]          [place], [place] [address] [name] of [place] Phone: [phone] Mobile Phone: [phone] Relation: Spouse  Code Status:  DNR Goals of care: Advanced Directive information Advanced Directives 02/08/2021  Does Patient Have a Medical Advance Directive? Yes  Type of Advance Directive Living will;Out of facility DNR (pink MOST or yellow form);Healthcare Power of Attorney  Does patient want to make changes to medical advance directive? -  Copy of [place] in Chart? Yes - validated most recent copy scanned in chart (See row information)  Would patient like information on creating a medical advance directive? -  Pre-existing out of facility DNR order (yellow form or pink MOST form) Yellow form placed in chart (order not valid for inpatient use);Pink MOST form placed in chart (order not valid for inpatient use)     Chief Complaint  Patient presents with  . Medical Management of Chronic Issues    Routine Visit    HPI:  Pt is a 80 y.o. female seen today for medical management of chronic diseases.  PMH significant for cerebral amyloid angiopathy, renal stones, carotid artery stenosis, HTN, UTI, vaginal hysterectomy, TIA, HTN and IBS.  There are no acute complaints for this visit.  Weight is  stable BP 138/76, other numbers in matrix are elevated up to 170s which may have been done with the automatic cuff.  She was treated for a UTI with keflex and was having irritative symptoms and discharge. The nurse reports this has resolved. She is on a D1 diet and tolerating well.  Bowels are moving No behavioral issues are reported.   Past Medical History:  Diagnosis Date  . Arthritis    back and neck  . Carotid artery stenosis   . Cerebral amyloid angiopathy (CODE) 06/20/2016  . Chronic rhinosinusitis   . DDD (degenerative disc disease)   . Depression   . Diverticulosis of colon   . Frequency of urination   . Glaucoma   . Headache disorder 12/13/2014   Right temple  . Hearing loss   . History of kidney stones    MULTIBLE SURGICAL INTERVENTIONS  . History of skin cancer HX TOPICAL FACIAL [place] 2013  . History of TIA (transient ischemic attack)    NOV 2013-- NO RESIDUAL  . Hypertension   . IBS (irritable bowel syndrome)   . Memory difficulty 05/14/2016  . Memory loss   . Mild asthma    COLD INDUCED  . Right ureteral stone   . Seasonal and perennial allergic rhinitis   . Shingles    EYEBALL 09-17-2013  . Spondylosis    Past Surgical History:  Procedure Laterality Date  . BUNIONECTOMY     BILATERAL  . CALDWELL LUC     sinus drainage procedure  . CYSTO/ BILATERAL URETEROSCOPIC STONE EXTRACTIONS  02-09-2003  . CYSTO/ BILATERAL URETEROSCOPY/  LASER OF STONES LEFT SIDE  06-02-2007  . CYSTOSCOPY/RETROGRADE/URETEROSCOPY/STONE EXTRACTION WITH BASKET  03/31/2012  Procedure: CYSTOSCOPY/RETROGRADE/URETEROSCOPY/STONE EXTRACTION WITH BASKET;  Surgeon: Malka So, MD;  Location[facility];  Service: Urology;  Laterality: Right;   . CYSTOSCOPY/RETROGRADE/URETEROSCOPY/STONE EXTRACTION WITH BASKET Right 03/29/2013   Procedure: CYSTOSCOPY//URETEROSCOPY/STONE EXTRACTION WITH BASKET;  Surgeon: Malka So, MD;  [facility];  Service: Urology;  Laterality: Right;   . CYSTOSCOPY/RETROGRADE/URETEROSCOPY/STONE EXTRACTION WITH BASKET Right 10/14/2013   Procedure: CYSTOSCOPY/RETROGRADE/URETEROSCOPY/STONE EXTRACTION WITH BASKET;  Surgeon: Irine Seal, MD;  Location[facility];  Service: Urology;  Laterality: Right;  . ESOPHAGOGASTRODUODENOSCOPY (EGD) WITH PROPOFOL N/A 04/12/2015   Procedure: ESOPHAGOGASTRODUODENOSCOPY (EGD) WITH PROPOFOL;  Surgeon: Arta Silence, MD;  [facility];  Service: Endoscopy;  Laterality: N/A;  . EUS N/A 04/12/2015   Procedure: ESOPHAGEAL ENDOSCOPIC ULTRASOUND (EUS) RADIAL;  Surgeon: Arta Silence, MD;  [facility];  Service: Endoscopy;  Laterality: N/A;  . EXTRACORPOREAL SHOCK WAVE LITHOTRIPSY  about 0630/ Dr Reece Agar   Never completed procedure-could not tolerate pain. Had seizures due to pain  . EYE SURGERY  AGE 70   INJURY  . HERNIA REPAIR  AGE 68  . HOLMIUM LASER APPLICATION Right 1/60/1093   Procedure: HOLMIUM LASER APPLICATION;  Surgeon: Irine Seal, MD;  [facility]  Service: Urology;  Laterality: Right;  . LAPAROSCOPIC CHOLECYSTECTOMY  01-12-2005  . LEFT URETEROSCOPIC STONE EXTRACTION    LAST ONE 04-28-2009   SEVERAL SURGICAL STONE EXTRACTIONS  . LUMBAR LAMINECTOMY  09-30-2002   L4 - 5;  SPONDYLOLISTHISES W/ STENOSIS AND RADICULOPATHY  . PERCUTANEOUS NEPHROSTOLITHOTOMY  1975  . RIGHT URETEROSCOPIC STONE EXTRACTION  LAST ONE 02-21-2011   MULTIPLE SURGICAL STONE EXTRACTIONS  . TONSILECTOMY, ADENOIDECTOMY, BILATERAL MYRINGOTOMY AND TUBES  CHILD  . VAGINAL HYSTERECTOMY  1991   W/ BILATERAL SALPINGOOPHECTOMY    Allergies  Allergen Reactions  . Donepezil Hcl   . Ace Inhibitors Cough  . Hctz [Hydrochlorothiazide]     Bladder problems  . Triptans     Avoid per neuro    Outpatient Encounter Medications as of 02/08/2021  Medication Sig  . aliskiren (TEKTURNA) 150 MG tablet Take 0.5 tablets (75 mg total) by mouth daily.  . DULoxetine (CYMBALTA) 60 MG capsule  TAKE 1 CAPSULE BY MOUTH ONCE DAILY **DO NOT CRUSH*  . OLANZapine (ZYPREXA) 5 MG tablet Take 1 tablet by mouth daily at 6 PM.  . polyethylene glycol (MIRALAX / GLYCOLAX) 17 g packet Take 17 g by mouth daily. With 8 oz of fluid  . sennosides-docusate sodium (SENOKOT-S) 8.6-50 MG tablet Take 2 tablets by mouth at bedtime.   No facility-administered encounter medications on file as of 02/08/2021.    Review of Systems  Unable to perform ROS: Dementia    Immunization History  Administered Date(s) Administered  . Hepatitis A 11/15/2003, 06/11/2004, 12/25/2012  . Hepatitis B 11/14/2008, 12/15/2008  . Influenza Split 06/17/2011  . Influenza Whole 06/16/2010  . Influenza, High Dose Seasonal PF 05/27/2016, 07/15/2019  . Influenza,inj,Quad PF,6+ Mos 07/07/2018  . Influenza-Unspecified 07/10/2017  . Moderna Sars-Covid-2 Vaccination 09/21/2019, 10/19/2019  . Pneumococcal Conjugate-13 08/09/2014  . Pneumococcal Polysaccharide-23 04/22/2011, 11/15/2011  . Tdap 11/14/2008, 12/25/2012  . Typhoid Inactivated 12/25/2012  . Yellow Fever 12/25/2012  . Zoster Recombinat (Shingrix) 01/03/2017, 04/04/2017  . Zoster, Live 05/07/2012   Pertinent  Health Maintenance Due  Topic Date Due  . INFLUENZA VACCINE  04/16/2021  . PNA vac Low Risk Adult  Completed   Fall Risk  11/17/2018 01/21/2018 10/29/2016  Falls in the past year? 1 Yes Yes  Number  falls in past yr: 1 2 or more 2 or more  Injury with Fall? 0 No Yes  Risk for fall due to : History of fall(s);Impaired mobility;Medication side effect;Impaired balance/gait;Mental status change - -  Follow up Falls evaluation completed;Education provided;Falls prevention discussed - Falls prevention discussed  Comment with staff at [facility]:    Vitals:   02/08/21 1504  BP: 138/76  Pulse: 67  Resp: 17  Temp: (!) 96.8 F (36 C)  SpO2: 99%  Weight: 150 lb (68 kg)  Height: 5\' 2"  (1.575 m)   Body mass index is 27.44 kg/m.   Wt Readings from Last 3 Encounters:  02/08/21 150 lb (68 kg)  01/05/21 150 lb (68 kg)  09/22/20 155 lb (70.3 kg)    Physical Exam Vitals and nursing note reviewed.  Constitutional:      General: She is not in acute distress.    Appearance: She is not diaphoretic.  HENT:     Head: Normocephalic and atraumatic.  Neck:     Vascular: No JVD.  Cardiovascular:     Rate and Rhythm: Normal rate and regular rhythm.     Heart sounds: No murmur heard.   Pulmonary:     Effort: Pulmonary effort is normal. No respiratory distress.     Breath sounds: Normal breath sounds. No wheezing.  Musculoskeletal:     Cervical back: No rigidity or tenderness.     Right lower leg: No edema.     Left lower leg: No edema.  Lymphadenopathy:     Cervical: No cervical adenopathy.  Skin:    General: Skin is warm and dry.  Neurological:     General: No focal deficit present.     Mental Status: She is alert. Mental status is at baseline.     Labs reviewed: Recent Labs    02/21/20 0000  NA 143  K 4.3  CL 105  CO2 28*  BUN 13  CREATININE 0.9  CALCIUM 9.3   No results for input(s): AST, ALT, ALKPHOS, BILITOT, PROT, ALBUMIN in the last 8760 hours. Recent Labs    02/21/20 0000  WBC 6.1  NEUTROABS 4  HGB 14.1  HCT 42  PLT 188   Lab Results  Component Value Date   TSH 1.93 02/01/2019   Lab Results  Component Value Date   HGBA1C 5.3 02/21/2020   Lab Results  Component Value Date   CHOL 183 11/14/2017   HDL 64 11/14/2017   LDLCALC 102 11/14/2017   TRIG 86 11/14/2017   CHOLHDL 2.9 07/15/2016    Significant Diagnostic Results in last 30 days:  No results found.  Assessment/Plan 1. Do not resuscitate Order updated  2. Chronic dementia, with behavioral disturbance (HCC) Severe stage Goals of care are comfort based with no hospitalizations   3. Slow transit constipation Controlled Continue senokot 2 tabs qhs and miralax qd   4. Essential hypertension Intermittently  elevated, recommend manual cuff for measures Avoid aggressive treatment due to goals of care.   5. Oropharyngeal dysphagia Continue D1 diet Asp prec 1:1 supervision   6. PBA (pseudobulbar affect) No recent symptoms, doing well off ativan Conitnues on Zyprexa 4 mg qd    Labs/tests ordered:  Labs due next month

## 2021-02-09 ENCOUNTER — Encounter: Payer: Self-pay | Admitting: Adult Health

## 2021-02-09 DIAGNOSIS — R1310 Dysphagia, unspecified: Secondary | ICD-10-CM | POA: Insufficient documentation

## 2021-03-09 ENCOUNTER — Encounter: Payer: Self-pay | Admitting: Adult Health

## 2021-03-09 ENCOUNTER — Non-Acute Institutional Stay (SKILLED_NURSING_FACILITY): Payer: Medicare Other | Admitting: Adult Health

## 2021-03-09 DIAGNOSIS — E210 Primary hyperparathyroidism: Secondary | ICD-10-CM

## 2021-03-09 DIAGNOSIS — F03918 Unspecified dementia, unspecified severity, with other behavioral disturbance: Secondary | ICD-10-CM

## 2021-03-09 DIAGNOSIS — F0391 Unspecified dementia with behavioral disturbance: Secondary | ICD-10-CM | POA: Diagnosis not present

## 2021-03-09 DIAGNOSIS — R1312 Dysphagia, oropharyngeal phase: Secondary | ICD-10-CM

## 2021-03-09 DIAGNOSIS — I680 Cerebral amyloid angiopathy: Secondary | ICD-10-CM | POA: Diagnosis not present

## 2021-03-09 DIAGNOSIS — D509 Iron deficiency anemia, unspecified: Secondary | ICD-10-CM

## 2021-03-09 DIAGNOSIS — I10 Essential (primary) hypertension: Secondary | ICD-10-CM

## 2021-03-09 NOTE — Progress Notes (Signed)
Location:  Occupational psychologist of Service:  SNF (31) Provider:   Cindi Carbon, [place] [phone]   Royal Hawthorn, NP  Patient Care Team: Royal Hawthorn, NP as PCP - General (Nurse Practitioner) Irine Seal, MD as Attending Physician (Urology) Jodi Marble, MD as Consulting Physician (Otolaryngology) Martinique, Amy, MD as Consulting Physician (Dermatology) Arta Silence, MD as Consulting Physician (Gastroenterology) Dohmeier, Asencion Partridge, MD as Consulting Physician (Neurology) Geanie Kenning, MD as Consulting Physician (Psychiatry)  Extended Emergency Contact Information Primary Emergency Contact: [name] Address: [address]          [place], [place] [address] [name] of [place] Phone: [phone] Mobile Phone: [phone] Relation: Spouse  Code Status:  DNR Goals of care: Advanced Directive information Advanced Directives 02/08/2021  Does Patient Have a Medical Advance Directive? Yes  Type of Advance Directive Living will;Out of facility DNR (pink MOST or yellow form);Healthcare Power of Attorney  Does patient want to make changes to medical advance directive? -  Copy of [place] in Chart? Yes - validated most recent copy scanned in chart (See row information)  Would patient like information on creating a medical advance directive? -  Pre-existing out of facility DNR order (yellow form or pink MOST form) Yellow form placed in chart (order not valid for inpatient use);Pink MOST form placed in chart (order not valid for inpatient use)     Chief Complaint  Patient presents with   Medical Management of Chronic Issues    HPI:  Pt is a 80 y.o. female seen today for medical management of chronic diseases.    BP range 112-145/71-84  Cerebral amyloid angiopathy with dementia: no current issues with resistance to care, crying, anger, etc. Tolerated tapering of ativan in the past  Needs  assistance with all ADLs, incontinent, hoyer lift for transfers  Was found on the mat by the bed 6/21 no injury Can lean forward in the chair which has to be tilted back to prevent this Uses pillow by bed to prevent rolling out  Noted hx of hyperparathyroidism with labs monitored annually   Lost 4 lbs in the past month  Currently on a D1 diet puree tolerating well. No choking episodes.   Past Medical History:  Diagnosis Date   Arthritis    back and neck   Carotid artery stenosis    Cerebral amyloid angiopathy (CODE) 06/20/2016   Chronic rhinosinusitis    DDD (degenerative disc disease)    Depression    Diverticulosis of colon    Frequency of urination    Glaucoma    Headache disorder 12/13/2014   Right temple   Hearing loss    History of kidney stones    MULTIBLE SURGICAL INTERVENTIONS   History of skin cancer HX TOPICAL FACIAL [place] 2013   History of TIA (transient ischemic attack)    NOV 2013-- NO RESIDUAL   Hypertension    IBS (irritable bowel syndrome)    Memory difficulty 05/14/2016   Memory loss    Mild asthma    COLD INDUCED   Right ureteral stone    Seasonal and perennial allergic rhinitis    Shingles    EYEBALL 09-17-2013   Spondylosis    Past Surgical History:  Procedure Laterality Date   BUNIONECTOMY     BILATERAL   CALDWELL LUC     sinus drainage procedure   CYSTO/ BILATERAL URETEROSCOPIC STONE EXTRACTIONS  02-09-2003   CYSTO/ BILATERAL URETEROSCOPY/  LASER  OF STONES LEFT SIDE  06-02-2007   CYSTOSCOPY/RETROGRADE/URETEROSCOPY/STONE EXTRACTION WITH BASKET  03/31/2012   Procedure: CYSTOSCOPY/RETROGRADE/URETEROSCOPY/STONE EXTRACTION WITH BASKET;  Surgeon: Malka So, MD;  Location: [facility];  Service: Urology;  Laterality: Right;    CYSTOSCOPY/RETROGRADE/URETEROSCOPY/STONE EXTRACTION WITH BASKET Right 03/29/2013   Procedure: CYSTOSCOPY//URETEROSCOPY/STONE EXTRACTION WITH BASKET;  Surgeon: Malka So, MD;  [facility];  Service:  Urology;  Laterality: Right;   CYSTOSCOPY/RETROGRADE/URETEROSCOPY/STONE EXTRACTION WITH BASKET Right 10/14/2013   Procedure: CYSTOSCOPY/RETROGRADE/URETEROSCOPY/STONE EXTRACTION WITH BASKET;  Surgeon: Irine Seal, MD;  Location: [facility];  Service: Urology;  Laterality: Right;   ESOPHAGOGASTRODUODENOSCOPY (EGD) WITH PROPOFOL N/A 04/12/2015   Procedure: ESOPHAGOGASTRODUODENOSCOPY (EGD) WITH PROPOFOL;  Surgeon: Arta Silence, MD;  [facility];  Service: Endoscopy;  Laterality: N/A;   EUS N/A 04/12/2015   Procedure: ESOPHAGEAL ENDOSCOPIC ULTRASOUND (EUS) RADIAL;  Surgeon: Arta Silence, MD;  [facility];  Service: Endoscopy;  Laterality: N/A;   EXTRACORPOREAL SHOCK WAVE LITHOTRIPSY  about 4270/ Dr Reece Agar   Never completed procedure-could not tolerate pain. Had seizures due to pain   EYE SURGERY  AGE 89   INJURY   HERNIA REPAIR  AGE 64   HOLMIUM LASER APPLICATION Right 03/09/7627   Procedure: HOLMIUM LASER APPLICATION;  Surgeon: Irine Seal, MD;  Location: [facility];  Service: Urology;  Laterality: Right;   LAPAROSCOPIC CHOLECYSTECTOMY  01-12-2005   LEFT URETEROSCOPIC STONE EXTRACTION    LAST ONE 04-28-2009   SEVERAL SURGICAL STONE EXTRACTIONS   LUMBAR LAMINECTOMY  09-30-2002   L4 - 5;  SPONDYLOLISTHISES W/ STENOSIS AND RADICULOPATHY   PERCUTANEOUS NEPHROSTOLITHOTOMY  1975   RIGHT URETEROSCOPIC STONE EXTRACTION  LAST ONE 02-21-2011   MULTIPLE SURGICAL STONE EXTRACTIONS   TONSILECTOMY, ADENOIDECTOMY, BILATERAL MYRINGOTOMY AND TUBES  CHILD   VAGINAL HYSTERECTOMY  1991   W/ BILATERAL SALPINGOOPHECTOMY    Allergies  Allergen Reactions   Donepezil Hcl    Ace Inhibitors Cough   Hctz [Hydrochlorothiazide]     Bladder problems   Triptans     Avoid per neuro    Outpatient Encounter Medications as of 03/09/2021  Medication Sig   aliskiren (TEKTURNA) 150 MG tablet Take 0.5 tablets (75 mg total) by mouth daily.   DULoxetine (CYMBALTA) 60 MG  capsule TAKE 1 CAPSULE BY MOUTH ONCE DAILY **DO NOT CRUSH*   OLANZapine (ZYPREXA) 5 MG tablet Take 1 tablet by mouth daily at 6 PM.   polyethylene glycol (MIRALAX / GLYCOLAX) 17 g packet Take 17 g by mouth daily. With 8 oz of fluid   sennosides-docusate sodium (SENOKOT-S) 8.6-50 MG tablet Take 2 tablets by mouth at bedtime.   No facility-administered encounter medications on file as of 03/09/2021.    Review of Systems  Unable to perform ROS: Dementia   Immunization History  Administered Date(s) Administered   Hepatitis A 11/15/2003, 06/11/2004, 12/25/2012   Hepatitis B 11/14/2008, 12/15/2008   Influenza Split 06/17/2011   Influenza Whole 06/16/2010   Influenza, High Dose Seasonal PF 05/27/2016, 07/15/2019   Influenza,inj,Quad PF,6+ Mos 07/07/2018   Influenza-Unspecified 07/10/2017   Moderna Sars-Covid-2 Vaccination 09/21/2019, 10/19/2019   Pneumococcal Conjugate-13 08/09/2014   Pneumococcal Polysaccharide-23 04/22/2011, 11/15/2011   Tdap 11/14/2008, 12/25/2012   Typhoid Inactivated 12/25/2012   Yellow Fever 12/25/2012   Zoster Recombinat (Shingrix) 01/03/2017, 04/04/2017   Zoster, Live 05/07/2012   Pertinent  Health Maintenance Due  Topic Date Due   INFLUENZA VACCINE  04/16/2021   PNA vac Low Risk Adult  Completed   Fall Risk  11/17/2018 01/21/2018 10/29/2016  Falls in the past year? 1 Yes Yes  Number falls in past yr: 1 2 or more 2 or more  Injury with Fall? 0 No Yes  Risk for fall due to : History of fall(s);Impaired mobility;Medication side effect;Impaired balance/gait;Mental status change - -  Follow up Falls evaluation completed;Education provided;Falls prevention discussed - Falls prevention discussed  Comment with staff at [facility]:    Vitals:   03/09/21 0937  Weight: 146 lb 9.6 oz (66.5 kg)   Body mass index is 26.81 kg/m. Wt Readings from Last 3 Encounters:  03/09/21 146 lb 9.6 oz (66.5 kg)  02/08/21 150 lb (68 kg)  01/05/21  150 lb (68 kg)    Physical Exam Vitals and nursing note reviewed.  Constitutional:      General: She is not in acute distress.    Appearance: She is not diaphoretic.  HENT:     Head: Normocephalic and atraumatic.  Neck:     Vascular: No JVD.  Cardiovascular:     Rate and Rhythm: Normal rate and regular rhythm.     Heart sounds: No murmur heard. Pulmonary:     Effort: Pulmonary effort is normal. No respiratory distress.     Breath sounds: Normal breath sounds. No wheezing.  Abdominal:     General: Bowel sounds are normal. There is no distension.     Palpations: Abdomen is soft.     Tenderness: There is no abdominal tenderness.  Musculoskeletal:     Right lower leg: No edema.     Left lower leg: No edema.  Skin:    General: Skin is warm and dry.  Neurological:     General: No focal deficit present.     Mental Status: She is alert. Mental status is at baseline.     Comments: Not able to f/c,  mild rigidity to BUE and BLE   Psychiatric:     Comments: Flat     Labs reviewed: No results for input(s): NA, K, CL, CO2, GLUCOSE, BUN, CREATININE, CALCIUM, MG, PHOS in the last 8760 hours. No results for input(s): AST, ALT, ALKPHOS, BILITOT, PROT, ALBUMIN in the last 8760 hours. No results for input(s): WBC, NEUTROABS, HGB, HCT, MCV, PLT in the last 8760 hours. Lab Results  Component Value Date   TSH 1.93 02/01/2019   Lab Results  Component Value Date   HGBA1C 5.3 02/21/2020   Lab Results  Component Value Date   CHOL 183 11/14/2017   HDL 64 11/14/2017   LDLCALC 102 11/14/2017   TRIG 86 11/14/2017   CHOLHDL 2.9 07/15/2016    Significant Diagnostic Results in last 30 days:  No results found.  Assessment/Plan   1. Cerebral amyloid angiopathy (CODE) Led to #2  2. Chronic dementia, with behavioral disturbance (HCC) Severe stage Continue supportive care Decreased Zyprexa to 2.5 mg and monitor behaviors.   3. Oropharyngeal dysphagia Continue D1 diet Asp prec 1:1  supervision  4. Essential hypertension Controlled Continue tekturna   5. Hyperparathyroidism, primary [facility]) Not currently on meds Monitor labs annually   6. Iron deficiency anemia, unspecified iron deficiency anemia type Has not been an issue Will check CBC   Labs/tests ordered:  CBC BMP intact PTH

## 2021-03-12 DIAGNOSIS — Z5181 Encounter for therapeutic drug level monitoring: Secondary | ICD-10-CM | POA: Diagnosis not present

## 2021-03-12 DIAGNOSIS — E210 Primary hyperparathyroidism: Secondary | ICD-10-CM | POA: Diagnosis not present

## 2021-03-12 LAB — BASIC METABOLIC PANEL
BUN: 11 (ref 4–21)
CO2: 25 — AB (ref 13–22)
Chloride: 104 (ref 99–108)
Creatinine: 0.8 (ref 0.5–1.1)
Glucose: 97
Potassium: 4.7 (ref 3.4–5.3)
Sodium: 142 (ref 137–147)

## 2021-03-12 LAB — CBC AND DIFFERENTIAL
HCT: 44 (ref 36–46)
Hemoglobin: 15.2 (ref 12.0–16.0)
Platelets: 206 (ref 150–399)
WBC: 7

## 2021-03-12 LAB — CBC: RBC: 4.89 (ref 3.87–5.11)

## 2021-03-12 LAB — COMPREHENSIVE METABOLIC PANEL: Calcium: 9.6 (ref 8.7–10.7)

## 2021-04-06 ENCOUNTER — Non-Acute Institutional Stay (SKILLED_NURSING_FACILITY): Payer: Medicare Other | Admitting: Adult Health

## 2021-04-06 ENCOUNTER — Encounter: Payer: Self-pay | Admitting: Adult Health

## 2021-04-06 DIAGNOSIS — F03918 Unspecified dementia, unspecified severity, with other behavioral disturbance: Secondary | ICD-10-CM

## 2021-04-06 DIAGNOSIS — F5105 Insomnia due to other mental disorder: Secondary | ICD-10-CM

## 2021-04-06 DIAGNOSIS — I10 Essential (primary) hypertension: Secondary | ICD-10-CM

## 2021-04-06 DIAGNOSIS — F482 Pseudobulbar affect: Secondary | ICD-10-CM | POA: Diagnosis not present

## 2021-04-06 DIAGNOSIS — F0391 Unspecified dementia with behavioral disturbance: Secondary | ICD-10-CM

## 2021-04-06 DIAGNOSIS — R1312 Dysphagia, oropharyngeal phase: Secondary | ICD-10-CM | POA: Diagnosis not present

## 2021-04-06 NOTE — Progress Notes (Signed)
Location:  [place] Room Number: 125-A Place of Service:  SNF (31) Provider:  Royal Hawthorn, NP  Patient Care Team: Royal Hawthorn, NP as PCP - General (Nurse Practitioner) Irine Seal, MD as Attending Physician (Urology) Jodi Marble, MD as Consulting Physician (Otolaryngology) Martinique, Amy, MD as Consulting Physician (Dermatology) Arta Silence, MD as Consulting Physician (Gastroenterology) Dohmeier, Asencion Partridge, MD as Consulting Physician (Neurology) Geanie Kenning, MD as Consulting Physician (Psychiatry)  Extended Emergency Contact Information Primary Emergency Contact: [name] Address: [address]          [place], [place] [address] [name] of [place] Phone: [phone] Mobile Phone: [phone] Relation: Spouse  Code Status:  DNR Goals of care: Advanced Directive information Advanced Directives 02/08/2021  Does Patient Have a Medical Advance Directive? Yes  Type of Advance Directive Living will;Out of facility DNR (pink MOST or yellow form);Healthcare Power of Attorney  Does patient want to make changes to medical advance directive? -  Copy of [place] in Chart? Yes - validated most recent copy scanned in chart (See row information)  Would patient like information on creating a medical advance directive? -  Pre-existing out of facility DNR order (yellow form or pink MOST form) Yellow form placed in chart (order not valid for inpatient use);Pink MOST form placed in chart (order not valid for inpatient use)     Chief Complaint  Patient presents with   Medical Management of Chronic Issues    Routine visit and discuss need for covid vaccine #4 or exclude     HPI:  Pt is a 80 y.o. female seen today for medical management of chronic diseases.  PMH significant for cerebral amyloid angiopathy with dementia, carotid artery stenosis, renal stones, UTI, PBA, DDD, TIA, HTN, IBS, and constipation.    Zyprexa was decreased to 2.5 mg last month. She has not had any issues with crying, agitation, etc. She has tried to get out of bed without help at night and was found on the mat x 3 with no significant injury. There are no reports that she is having trouble sleeping but has had issues in the past. During the day she spends a lot of the time sleeping. She has a period of wakefulness and this is when the CNAs feed her.   Her family had a careplan and were requesting to taper any unnecessary meds. Her husband Jenny Reichmann wants her to be comfortable and avoid any life sustaining measures due to her reduced QOL.    She continues to tolerate a D1 diet with no choking or coughing.   Wt Readings from Last 3 Encounters:  04/06/21 147 lb (66.7 kg)  03/09/21 146 lb 9.6 oz (66.5 kg)  02/08/21 150 lb (68 kg)  Weight is fairly stable  BP 120-140 range.    Past Medical History:  Diagnosis Date   Arthritis    back and neck   Carotid artery stenosis    Cerebral amyloid angiopathy (CODE) 06/20/2016   Chronic rhinosinusitis    DDD (degenerative disc disease)    Depression    Diverticulosis of colon    Frequency of urination    Glaucoma    Headache disorder 12/13/2014   Right temple   Hearing loss    History of kidney stones    MULTIBLE SURGICAL INTERVENTIONS   History of skin cancer HX TOPICAL FACIAL [place] 2013   History of TIA (transient ischemic attack)    NOV 2013-- NO RESIDUAL   Hypertension  IBS (irritable bowel syndrome)    Memory difficulty 05/14/2016   Memory loss    Mild asthma    COLD INDUCED   Right ureteral stone    Seasonal and perennial allergic rhinitis    Shingles    EYEBALL 09-17-2013   Spondylosis    Past Surgical History:  Procedure Laterality Date   BUNIONECTOMY     BILATERAL   CALDWELL LUC     sinus drainage procedure   CYSTO/ BILATERAL URETEROSCOPIC STONE EXTRACTIONS  02-09-2003   CYSTO/ BILATERAL URETEROSCOPY/  LASER OF STONES LEFT SIDE  06-02-2007    CYSTOSCOPY/RETROGRADE/URETEROSCOPY/STONE EXTRACTION WITH BASKET  03/31/2012   Procedure: CYSTOSCOPY/RETROGRADE/URETEROSCOPY/STONE EXTRACTION WITH BASKET;  Surgeon: Malka So, MD;  [facility];  Service: Urology;  Laterality: Right;    CYSTOSCOPY/RETROGRADE/URETEROSCOPY/STONE EXTRACTION WITH BASKET Right 03/29/2013   Procedure: CYSTOSCOPY//URETEROSCOPY/STONE EXTRACTION WITH BASKET;  Surgeon: Malka So, MD;  [facility];  Service: Urology;  Laterality: Right;   CYSTOSCOPY/RETROGRADE/URETEROSCOPY/STONE EXTRACTION WITH BASKET Right 10/14/2013   Procedure: CYSTOSCOPY/RETROGRADE/URETEROSCOPY/STONE EXTRACTION WITH BASKET;  Surgeon: Irine Seal, MD;  Location: [facility];  Service: Urology;  Laterality: Right;   ESOPHAGOGASTRODUODENOSCOPY (EGD) WITH PROPOFOL N/A 04/12/2015   Procedure: ESOPHAGOGASTRODUODENOSCOPY (EGD) WITH PROPOFOL;  Surgeon: Arta Silence, MD;  [facility];  Service: Endoscopy;  Laterality: N/A;   EUS N/A 04/12/2015   Procedure: ESOPHAGEAL ENDOSCOPIC ULTRASOUND (EUS) RADIAL;  Surgeon: Arta Silence, MD;  [facility];  Service: Endoscopy;  Laterality: N/A;   EXTRACORPOREAL SHOCK WAVE LITHOTRIPSY  about [id number] Dr Reece Agar   Never completed procedure-could not tolerate pain. Had seizures due to pain   EYE SURGERY  AGE 65   INJURY   HERNIA REPAIR  AGE 37   HOLMIUM LASER APPLICATION Right [id number]   Procedure: HOLMIUM LASER APPLICATION;  Surgeon: Irine Seal, MD;  Location: [facility];  Service: Urology;  Laterality: Right;   LAPAROSCOPIC CHOLECYSTECTOMY  01-12-2005   LEFT URETEROSCOPIC STONE EXTRACTION    LAST ONE 04-28-2009   SEVERAL SURGICAL STONE EXTRACTIONS   LUMBAR LAMINECTOMY  09-30-2002   L4 - 5;  SPONDYLOLISTHISES W/ STENOSIS AND RADICULOPATHY   PERCUTANEOUS NEPHROSTOLITHOTOMY  1975   RIGHT URETEROSCOPIC STONE EXTRACTION  LAST ONE 02-21-2011   MULTIPLE SURGICAL STONE EXTRACTIONS   TONSILECTOMY,  ADENOIDECTOMY, BILATERAL MYRINGOTOMY AND TUBES  CHILD   VAGINAL HYSTERECTOMY  1991   W/ BILATERAL SALPINGOOPHECTOMY    Allergies  Allergen Reactions   Donepezil Hcl    Ace Inhibitors Cough   Hctz [Hydrochlorothiazide]     Bladder problems   Triptans     Avoid per neuro    Outpatient Encounter Medications as of 04/06/2021  Medication Sig   aliskiren (TEKTURNA) 150 MG tablet Take 0.5 tablets (75 mg total) by mouth daily.   DULoxetine (CYMBALTA) 60 MG capsule TAKE 1 CAPSULE BY MOUTH ONCE DAILY **DO NOT CRUSH*   OLANZapine (ZYPREXA) 2.5 MG tablet Take 2.5 mg by mouth daily. 6 pm   polyethylene glycol (MIRALAX / GLYCOLAX) 17 g packet Take 17 g by mouth daily. With 8 oz of fluid   sennosides-docusate sodium (SENOKOT-S) 8.6-50 MG tablet Take 2 tablets by mouth at bedtime.   [DISCONTINUED] OLANZapine (ZYPREXA) 5 MG tablet Take 0.5 tablets by mouth daily at 6 PM.   No facility-administered encounter medications on file as of 04/06/2021.    Review of Systems  Unable to perform ROS: Dementia   Immunization History  Administered Date(s) Administered   Hepatitis A 11/15/2003, 06/11/2004, 12/25/2012  Hepatitis B 11/14/2008, 12/15/2008   Influenza Split 06/17/2011   Influenza Whole 06/16/2010   Influenza, High Dose Seasonal PF 05/27/2016, 07/15/2019   Influenza,inj,Quad PF,6+ Mos 07/07/2018   Influenza-Unspecified 07/10/2017   Moderna SARS-COV2 Booster Vaccination 07/26/2020   Moderna Sars-Covid-2 Vaccination 09/21/2019, 10/19/2019   Pneumococcal Conjugate-13 08/09/2014   Pneumococcal Polysaccharide-23 04/22/2011, 11/15/2011   Tdap 11/14/2008, 12/25/2012   Typhoid Inactivated 12/25/2012   Yellow Fever 12/25/2012   Zoster Recombinat (Shingrix) 01/03/2017, 04/04/2017   Zoster, Live 05/07/2012   Pertinent  Health Maintenance Due  Topic Date Due   INFLUENZA VACCINE  04/16/2021   PNA vac Low Risk Adult  Completed   Fall Risk  11/17/2018 01/21/2018 10/29/2016  Falls in the past year? 1  Yes Yes  Number falls in past yr: 1 2 or more 2 or more  Injury with Fall? 0 No Yes  Risk for fall due to : History of fall(s);Impaired mobility;Medication side effect;Impaired balance/gait;Mental status change - -  Follow up Falls evaluation completed;Education provided;Falls prevention discussed - Falls prevention discussed  Comment with staff at [facility]:    Vitals:   04/06/21 1024  BP: 124/68  Pulse: 74  Resp: 16  Temp: (!) 97.3 F (36.3 C)  SpO2: 95%  Weight: 147 lb (66.7 kg)  Height: '5\' 2"'$  (1.575 m)   Body mass index is 26.89 kg/m. Physical Exam Vitals and nursing note reviewed.  Constitutional:      General: She is not in acute distress.    Appearance: She is not diaphoretic.     Comments: asleep  HENT:     Head: Normocephalic and atraumatic.     Mouth/Throat:     Mouth: Mucous membranes are moist.     Pharynx: Oropharynx is clear.  Eyes:     Conjunctiva/sclera: Conjunctivae normal.     Pupils: Pupils are equal, round, and reactive to light.  Neck:     Vascular: No JVD.  Cardiovascular:     Rate and Rhythm: Normal rate and regular rhythm.     Heart sounds: No murmur heard. Pulmonary:     Effort: Pulmonary effort is normal. No respiratory distress.     Breath sounds: Normal breath sounds. No wheezing.  Abdominal:     General: Abdomen is flat. Bowel sounds are normal. There is no distension.     Palpations: Abdomen is soft.  Musculoskeletal:     Cervical back: No rigidity or tenderness.     Right lower leg: No edema.     Left lower leg: No edema.  Lymphadenopathy:     Cervical: No cervical adenopathy.  Skin:    General: Skin is warm and dry.  Neurological:     General: No focal deficit present.     Mental Status: Mental status is at baseline.  Psychiatric:     Comments: flat    Labs reviewed: Recent Labs    03/12/21 0000  NA 142  K 4.7  CL 104  CO2 25*  BUN 11  CREATININE 0.8  CALCIUM 9.6   No results  for input(s): AST, ALT, ALKPHOS, BILITOT, PROT, ALBUMIN in the last 8760 hours. Recent Labs    03/12/21 0000  WBC 7.0  HGB 15.2  HCT 44  PLT 206   Lab Results  Component Value Date   TSH 1.93 02/01/2019   Lab Results  Component Value Date   HGBA1C 5.3 02/21/2020   Lab Results  Component Value Date   CHOL 183  11/14/2017   HDL 64 11/14/2017   LDLCALC 102 11/14/2017   TRIG 86 11/14/2017   CHOLHDL 2.9 07/15/2016    Significant Diagnostic Results in last 30 days:  No results found.  Assessment/Plan  1. Essential hypertension Controlled Will discontinue tekturna and monitor for pill burden decrease   2. Chronic dementia, with behavioral disturbance (HCC) Severe stage Goals of care are comfort based Will continue zyprexa at this time   Decrease cymbalta to 30 mg qd and may taper further next visit  3. PBA (pseudobulbar affect) Not currently an issue  4. Insomnia due to mental disorder Unclear if this is contributing to her falls to the mat Will have staff monitor sleep cycle and report back.   5. Oropharyngeal dysphagia Continue D1 diet  Asp prec 1:1 sypervision   Family/ staff Communication: discussed her care with her husband Jenny Reichmann.  He would like to keep her comfortable at [facility] and avoid any unnecessary or life prolonging measures. She has a DNR in place and most form. May consider avoiding antibiotics in the future but this would need to be discussed with Jenny Reichmann prior to decision being made  Labs/tests ordered:  NA

## 2021-05-08 DIAGNOSIS — Z23 Encounter for immunization: Secondary | ICD-10-CM | POA: Diagnosis not present

## 2021-05-14 ENCOUNTER — Non-Acute Institutional Stay (SKILLED_NURSING_FACILITY): Payer: Medicare Other | Admitting: Internal Medicine

## 2021-05-14 ENCOUNTER — Encounter: Payer: Self-pay | Admitting: Internal Medicine

## 2021-05-14 DIAGNOSIS — R1312 Dysphagia, oropharyngeal phase: Secondary | ICD-10-CM | POA: Diagnosis not present

## 2021-05-14 DIAGNOSIS — F0391 Unspecified dementia with behavioral disturbance: Secondary | ICD-10-CM | POA: Diagnosis not present

## 2021-05-14 DIAGNOSIS — F03918 Unspecified dementia, unspecified severity, with other behavioral disturbance: Secondary | ICD-10-CM

## 2021-05-14 DIAGNOSIS — I680 Cerebral amyloid angiopathy: Secondary | ICD-10-CM | POA: Diagnosis not present

## 2021-05-14 NOTE — Progress Notes (Signed)
Location:   [facility] Room Number: 125 Place of Service:  SNF [phone]) Provider:  Veleta Miners MD  Royal Hawthorn, NP  Patient Care Team: Royal Hawthorn, NP as PCP - General (Nurse Practitioner) Irine Seal, MD as Attending Physician (Urology) Jodi Marble, MD as Consulting Physician (Otolaryngology) Martinique, Amy, MD as Consulting Physician (Dermatology) Arta Silence, MD as Consulting Physician (Gastroenterology) Dohmeier, Asencion Partridge, MD as Consulting Physician (Neurology) Geanie Kenning, MD as Consulting Physician (Psychiatry)  Extended Emergency Contact Information Primary Emergency Contact: [name] Address: [address]          [place], [place] [address] [name] of [place] Phone: [phone] Mobile Phone: [phone] Relation: Spouse  Code Status:  DNR Goals of care: Advanced Directive information Advanced Directives 05/14/2021  Does Patient Have a Medical Advance Directive? Yes  Type of Advance Directive Living will;Out of facility DNR (pink MOST or yellow form)  Does patient want to make changes to medical advance directive? No - Patient declined  Copy of [place] in Chart? -  Would patient like information on creating a medical advance directive? -  Pre-existing out of facility DNR order (yellow form or pink MOST form) Yellow form placed in chart (order not valid for inpatient use);Pink MOST form placed in chart (order not valid for inpatient use)     Chief Complaint  Patient presents with   Medical Management of Chronic Issues   Quality Metric Gaps    #4 Covid and Flu shot    HPI:  Pt is a 80 y.o. female seen today for medical management of chronic diseases.   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 Complete Dependent for her ADLS and Harrel Lemon Life Is on D 1 Diet No Nursing issues Has lost more weight       Past Medical History:  Diagnosis Date   Arthritis    back and neck   Carotid artery stenosis    Cerebral amyloid angiopathy (CODE) 06/20/2016   Chronic rhinosinusitis    DDD (degenerative disc disease)    Depression    Diverticulosis of colon    Frequency of urination    Glaucoma    Headache disorder 12/13/2014   Right temple   Hearing loss    History of kidney stones    MULTIBLE SURGICAL INTERVENTIONS   History of skin cancer HX TOPICAL FACIAL [place] 2013   History of TIA (transient ischemic attack)    NOV 2013-- NO RESIDUAL   Hypertension    IBS (irritable bowel syndrome)    Memory difficulty 05/14/2016   Memory loss    Mild asthma    COLD INDUCED   Right ureteral stone    Seasonal and perennial allergic rhinitis    Shingles    EYEBALL 09-17-2013   Spondylosis    Past Surgical History:  Procedure Laterality Date   BUNIONECTOMY     BILATERAL   CALDWELL LUC     sinus drainage procedure   CYSTO/ BILATERAL URETEROSCOPIC STONE EXTRACTIONS  02-09-2003   CYSTO/ BILATERAL URETEROSCOPY/  LASER OF STONES LEFT SIDE  06-02-2007   CYSTOSCOPY/RETROGRADE/URETEROSCOPY/STONE EXTRACTION WITH BASKET  03/31/2012   Procedure: CYSTOSCOPY/RETROGRADE/URETEROSCOPY/STONE EXTRACTION WITH BASKET;  Surgeon: Malka So, MD;  Location: [facility];  Service: Urology;  Laterality: Right;    CYSTOSCOPY/RETROGRADE/URETEROSCOPY/STONE EXTRACTION WITH BASKET Right 03/29/2013   Procedure: CYSTOSCOPY//URETEROSCOPY/STONE EXTRACTION WITH BASKET;  Surgeon: Malka So, MD;  Location: [facility];  Service: Urology;  Laterality: Right;   CYSTOSCOPY/RETROGRADE/URETEROSCOPY/STONE EXTRACTION WITH BASKET Right 10/14/2013   Procedure: CYSTOSCOPY/RETROGRADE/URETEROSCOPY/STONE EXTRACTION WITH BASKET;  Surgeon: Irine Seal, MD;  Location: [facility];  Service: Urology;  Laterality: Right;   ESOPHAGOGASTRODUODENOSCOPY (EGD) WITH PROPOFOL N/A 04/12/2015   Procedure:  ESOPHAGOGASTRODUODENOSCOPY (EGD) WITH PROPOFOL;  Surgeon: Arta Silence, MD;  Location: [facility];  Service: Endoscopy;  Laterality: N/A;   EUS N/A 04/12/2015   Procedure: ESOPHAGEAL ENDOSCOPIC ULTRASOUND (EUS) RADIAL;  Surgeon: Arta Silence, MD;  Location: [facility];  Service: Endoscopy;  Laterality: N/A;   EXTRACORPOREAL SHOCK WAVE LITHOTRIPSY  about [id number] Dr Reece Agar   Never completed procedure-could not tolerate pain. Had seizures due to pain   EYE SURGERY  AGE 81   INJURY   HERNIA REPAIR  AGE 56   HOLMIUM LASER APPLICATION Right [id number]   Procedure: HOLMIUM LASER APPLICATION;  Surgeon: Irine Seal, MD;  [facility];  Service: Urology;  Laterality: Right;   LAPAROSCOPIC CHOLECYSTECTOMY  01-12-2005   LEFT URETEROSCOPIC STONE EXTRACTION    LAST ONE 04-28-2009   SEVERAL SURGICAL STONE EXTRACTIONS   LUMBAR LAMINECTOMY  09-30-2002   L4 - 5;  SPONDYLOLISTHISES W/ STENOSIS AND RADICULOPATHY   PERCUTANEOUS NEPHROSTOLITHOTOMY  1975   RIGHT URETEROSCOPIC STONE EXTRACTION  LAST ONE 02-21-2011   MULTIPLE SURGICAL STONE EXTRACTIONS   TONSILECTOMY, ADENOIDECTOMY, BILATERAL MYRINGOTOMY AND TUBES  CHILD   VAGINAL HYSTERECTOMY  1991   W/ BILATERAL SALPINGOOPHECTOMY    Allergies  Allergen Reactions   Donepezil Hcl    Ace Inhibitors Cough   Hctz [Hydrochlorothiazide]     Bladder problems   Triptans     Avoid per neuro    Allergies as of 05/14/2021       Reactions   Donepezil Hcl    Ace Inhibitors Cough   Hctz [hydrochlorothiazide]    Bladder problems   Triptans    Avoid per neuro        Medication List        Accurate as of May 14, 2021 11:11 AM. If you have any questions, ask your nurse or doctor.          DULoxetine 30 MG capsule Commonly known as: CYMBALTA Take 30 mg by mouth daily.   OLANZapine 2.5 MG tablet Commonly known as: ZYPREXA Take 2.5 mg by mouth daily. 6 pm   polyethylene glycol 17 g packet Commonly known as: MIRALAX  / GLYCOLAX Take 17 g by mouth daily. With 8 oz of fluid   sennosides-docusate sodium 8.6-50 MG tablet Commonly known as: SENOKOT-S Take 2 tablets by mouth at bedtime.        Review of Systems  Unable to perform ROS: Dementia   Immunization History  Administered Date(s) Administered   Hepatitis A 11/15/2003, 06/11/2004, 12/25/2012   Hepatitis B 11/14/2008, 12/15/2008   Influenza Split 06/17/2011   Influenza Whole 06/16/2010   Influenza, High Dose Seasonal PF 05/27/2016, 07/15/2019   Influenza,inj,Quad PF,6+ Mos 07/07/2018   Influenza-Unspecified 07/10/2017   Moderna SARS-COV2 Booster Vaccination 07/26/2020   Moderna Sars-Covid-2 Vaccination 09/21/2019, 10/19/2019   Pneumococcal Conjugate-13 08/09/2014   Pneumococcal Polysaccharide-23 04/22/2011, 11/15/2011   Tdap 11/14/2008, 12/25/2012   Typhoid Inactivated 12/25/2012   Yellow Fever 12/25/2012   Zoster Recombinat (Shingrix) 01/03/2017, 04/04/2017   Zoster, Live 05/07/2012   Pertinent  Health Maintenance Due  Topic Date Due   INFLUENZA VACCINE  04/16/2021   PNA vac Low Risk Adult  Completed   Fall Risk  11/17/2018 01/21/2018 10/29/2016  Falls in the past year? 1 Yes Yes  Number falls in past yr: 1 2 or more 2 or more  Injury with Fall? 0 No Yes  Risk for fall due to : History of fall(s);Impaired mobility;Medication side effect;Impaired balance/gait;Mental status change - -  Follow up Falls evaluation completed;Education provided;Falls prevention discussed - Falls prevention discussed  Comment with staff at [facility]:    Vitals:   05/14/21 1105  BP: 124/68  Pulse: 74  Resp: 16  Temp: (!) 97.3 F (36.3 C)  SpO2: 95%  Weight: 144 lb 9.6 oz (65.6 kg)  Height: '5\' 2"'$  (1.575 m)   Body mass index is 26.45 kg/m. Physical Exam Vitals reviewed.  HENT:     Head: Normocephalic.     Nose: Nose normal.     Mouth/Throat:     Mouth: Mucous membranes are moist.     Pharynx: Oropharynx is  clear.  Eyes:     Pupils: Pupils are equal, round, and reactive to light.  Cardiovascular:     Rate and Rhythm: Normal rate.     Pulses: Normal pulses.     Heart sounds: Normal heart sounds.  Pulmonary:     Effort: Pulmonary effort is normal.     Breath sounds: Normal breath sounds.  Abdominal:     General: Abdomen is flat. Bowel sounds are normal.     Palpations: Abdomen is soft.  Musculoskeletal:        General: Swelling present.     Cervical back: Neck supple.  Skin:    General: Skin is warm.  Neurological:     Mental Status: She is alert.     Comments: Does not respond. Her eyes were open Unable to do more exam   Psychiatric:        Mood and Affect: Mood normal.    Labs reviewed: Recent Labs    03/12/21 0000  NA 142  K 4.7  CL 104  CO2 25*  BUN 11  CREATININE 0.8  CALCIUM 9.6   No results for input(s): AST, ALT, ALKPHOS, BILITOT, PROT, ALBUMIN in the last 8760 hours. Recent Labs    03/12/21 0000  WBC 7.0  HGB 15.2  HCT 44  PLT 206   Lab Results  Component Value Date   TSH 1.93 02/01/2019   Lab Results  Component Value Date   HGBA1C 5.3 02/21/2020   Lab Results  Component Value Date   CHOL 183 11/14/2017   HDL 64 11/14/2017   LDLCALC 102 11/14/2017   TRIG 86 11/14/2017   CHOLHDL 2.9 07/15/2016    Significant Diagnostic Results in last 30 days:  No results found.  Assessment/Plan Patient with End stage dementia and Total Care with Dysphagia Will discontinue Zyprexa Eventually will taper her off Zoloft Patient is comfort care Would not do any Labs    Family/ staff Communication:   Labs/tests ordered:

## 2021-05-29 ENCOUNTER — Non-Acute Institutional Stay (SKILLED_NURSING_FACILITY): Payer: Medicare Other | Admitting: Orthopedic Surgery

## 2021-05-29 ENCOUNTER — Encounter: Payer: Self-pay | Admitting: Orthopedic Surgery

## 2021-05-29 DIAGNOSIS — L2089 Other atopic dermatitis: Secondary | ICD-10-CM | POA: Diagnosis not present

## 2021-05-29 NOTE — Progress Notes (Signed)
Location:  [facility] Room Number: 125-A Place of Service:  SNF (31) Provider:  Yvonna Alanis, NP   Patient Care Team: Royal Hawthorn, NP as PCP - General (Nurse Practitioner) Irine Seal, MD as Attending Physician (Urology) Jodi Marble, MD as Consulting Physician (Otolaryngology) Martinique, Ilham Roughton, MD as Consulting Physician (Dermatology) Arta Silence, MD as Consulting Physician (Gastroenterology) Dohmeier, Asencion Partridge, MD as Consulting Physician (Neurology) Geanie Kenning, MD as Consulting Physician (Psychiatry)  Extended Emergency Contact Information Primary Emergency Contact: [name] Address: [address]          [place], [place] [address] [name] of [place] Phone: [phone] Mobile Phone: [phone] Relation: Spouse  Code Status:  DNR Goals of care: Advanced Directive information Advanced Directives 05/29/2021  Does Patient Have a Medical Advance Directive? Yes  Type of Advance Directive Living will;Out of facility DNR (pink MOST or yellow form)  Does patient want to make changes to medical advance directive? No - Patient declined  Copy of [place] in Chart? -  Would patient like information on creating a medical advance directive? -  Pre-existing out of facility DNR order (yellow form or pink MOST form) Yellow form placed in chart (order not valid for inpatient use);Pink MOST form placed in chart (order not valid for inpatient use)     Chief Complaint  Patient presents with   Acute Visit    Rash    HPI:  Pt is a 80 y.o. female seen today for an acute visit for rash.   She is a poor historian due to dementia.   Nursing reports rash over chest and upper legs. Symptoms began 2 days ago. She has also been observed scratching her chest. Today, her chest has open scabs.   No changes to soaps or detergents. Nursing staff denies contact with insects or poison ivy. No recent changes in medications.     Past Medical History:  Diagnosis Date   Arthritis    back and neck   Carotid artery stenosis    Cerebral amyloid angiopathy (CODE) 06/20/2016   Chronic rhinosinusitis    DDD (degenerative disc disease)    Depression    Diverticulosis of colon    Frequency of urination    Glaucoma    Headache disorder 12/13/2014   Right temple   Hearing loss    History of kidney stones    MULTIBLE SURGICAL INTERVENTIONS   History of skin cancer HX TOPICAL FACIAL [place] 2013   History of TIA (transient ischemic attack)    NOV 2013-- NO RESIDUAL   Hypertension    IBS (irritable bowel syndrome)    Memory difficulty 05/14/2016   Memory loss    Mild asthma    COLD INDUCED   Right ureteral stone    Seasonal and perennial allergic rhinitis    Shingles    EYEBALL 09-17-2013   Spondylosis    Past Surgical History:  Procedure Laterality Date   BUNIONECTOMY     BILATERAL   CALDWELL LUC     sinus drainage procedure   CYSTO/ BILATERAL URETEROSCOPIC STONE EXTRACTIONS  02-09-2003   CYSTO/ BILATERAL URETEROSCOPY/  LASER OF STONES LEFT SIDE  06-02-2007   CYSTOSCOPY/RETROGRADE/URETEROSCOPY/STONE EXTRACTION WITH BASKET  03/31/2012   Procedure: CYSTOSCOPY/RETROGRADE/URETEROSCOPY/STONE EXTRACTION WITH BASKET;  Surgeon: Malka So, MD;  Location: [facility];  Service: Urology;  Laterality: Right;    CYSTOSCOPY/RETROGRADE/URETEROSCOPY/STONE EXTRACTION WITH BASKET Right 03/29/2013   Procedure: CYSTOSCOPY//URETEROSCOPY/STONE EXTRACTION WITH BASKET;  Surgeon: Marshall Cork  Jeffie Pollock, MD;  Location: [facility];  Service: Urology;  Laterality: Right;   CYSTOSCOPY/RETROGRADE/URETEROSCOPY/STONE EXTRACTION WITH BASKET Right 10/14/2013   Procedure: CYSTOSCOPY/RETROGRADE/URETEROSCOPY/STONE EXTRACTION WITH BASKET;  Surgeon: Irine Seal, MD;  Location: [facility];  Service: Urology;  Laterality: Right;   ESOPHAGOGASTRODUODENOSCOPY (EGD) WITH PROPOFOL N/A 04/12/2015   Procedure: ESOPHAGOGASTRODUODENOSCOPY  (EGD) WITH PROPOFOL;  Surgeon: Arta Silence, MD;  [facility];  Service: Endoscopy;  Laterality: N/A;   EUS N/A 04/12/2015   Procedure: ESOPHAGEAL ENDOSCOPIC ULTRASOUND (EUS) RADIAL;  Surgeon: Arta Silence, MD;  Location: [facility];  Service: Endoscopy;  Laterality: N/A;   EXTRACORPOREAL SHOCK WAVE LITHOTRIPSY  about [id number] Dr Reece Agar   Never completed procedure-could not tolerate pain. Had seizures due to pain   EYE SURGERY  AGE 89   INJURY   HERNIA REPAIR  AGE 80   HOLMIUM LASER APPLICATION Right [id number]   Procedure: HOLMIUM LASER APPLICATION;  Surgeon: Irine Seal, MD;  Location: [facility];  Service: Urology;  Laterality: Right;   LAPAROSCOPIC CHOLECYSTECTOMY  01-12-2005   LEFT URETEROSCOPIC STONE EXTRACTION    LAST ONE 04-28-2009   SEVERAL SURGICAL STONE EXTRACTIONS   LUMBAR LAMINECTOMY  09-30-2002   L4 - 5;  SPONDYLOLISTHISES W/ STENOSIS AND RADICULOPATHY   PERCUTANEOUS NEPHROSTOLITHOTOMY  1975   RIGHT URETEROSCOPIC STONE EXTRACTION  LAST ONE 02-21-2011   MULTIPLE SURGICAL STONE EXTRACTIONS   TONSILECTOMY, ADENOIDECTOMY, BILATERAL MYRINGOTOMY AND TUBES  CHILD   VAGINAL HYSTERECTOMY  1991   W/ BILATERAL SALPINGOOPHECTOMY    Allergies  Allergen Reactions   Donepezil Hcl    Ace Inhibitors Cough   Hctz [Hydrochlorothiazide]     Bladder problems   Triptans     Avoid per neuro    Outpatient Encounter Medications as of 05/29/2021  Medication Sig   DULoxetine (CYMBALTA) 30 MG capsule Take 30 mg by mouth daily.   polyethylene glycol (MIRALAX / GLYCOLAX) 17 g packet Take 17 g by mouth daily. With 8 oz of fluid   sennosides-docusate sodium (SENOKOT-S) 8.6-50 MG tablet Take 2 tablets by mouth at bedtime.   No facility-administered encounter medications on file as of 05/29/2021.    Review of Systems  Unable to perform ROS: Dementia   Immunization History  Administered Date(s) Administered   Hepatitis A 11/15/2003, 06/11/2004, 12/25/2012    Hepatitis B 11/14/2008, 12/15/2008   Influenza Split 06/17/2011   Influenza Whole 06/16/2010   Influenza, High Dose Seasonal PF 05/27/2016, 07/15/2019   Influenza,inj,Quad PF,6+ Mos 07/07/2018   Influenza-Unspecified 07/10/2017   Moderna SARS-COV2 Booster Vaccination 07/26/2020, 05/08/2021   Moderna Sars-Covid-2 Vaccination 09/21/2019, 10/19/2019   Pneumococcal Conjugate-13 08/09/2014   Pneumococcal Polysaccharide-23 04/22/2011, 11/15/2011   Tdap 11/14/2008, 12/25/2012   Typhoid Inactivated 12/25/2012   Yellow Fever 12/25/2012   Zoster Recombinat (Shingrix) 01/03/2017, 04/04/2017   Zoster, Live 05/07/2012   Pertinent  Health Maintenance Due  Topic Date Due   INFLUENZA VACCINE  04/16/2021   PNA vac Low Risk Adult  Completed   Fall Risk  11/17/2018 01/21/2018 10/29/2016  Falls in the past year? 1 Yes Yes  Number falls in past yr: 1 2 or more 2 or more  Injury with Fall? 0 No Yes  Risk for fall due to : History of fall(s);Impaired mobility;Medication side effect;Impaired balance/gait;Mental status change - -  Follow up Falls evaluation completed;Education provided;Falls prevention discussed - Falls prevention discussed  Comment with staff at [facility]:    Vitals:   05/29/21 1418  BP: Marland Kitchen)  167/88  Pulse: 84  Resp: 18  Temp: 98.3 F (36.8 C)  SpO2: 95%  Weight: 145 lb (65.8 kg)  Height: '5\' 2"'$  (1.575 m)   Body mass index is 26.52 kg/m. Physical Exam Vitals reviewed.  Constitutional:      General: She is not in acute distress. Cardiovascular:     Rate and Rhythm: Normal rate and regular rhythm.     Pulses: Normal pulses.     Heart sounds: Normal heart sounds.  Pulmonary:     Effort: Pulmonary effort is normal. No respiratory distress.     Breath sounds: Normal breath sounds. No wheezing.  Skin:    Capillary Refill: Capillary refill takes less than 2 seconds.     Findings: Rash present.     Comments: Scattered red,papule rash over chest  between breasts, some skin breakdown and scratch marks noted. Scattered red,papular rash over upper legs, some scabbing present.   Neurological:     General: No focal deficit present.     Mental Status: She is alert. Mental status is at baseline.  Psychiatric:        Mood and Affect: Mood normal.        Speech: She is noncommunicative.        Behavior: Behavior normal.        Cognition and Memory: Memory is impaired.    Labs reviewed: Recent Labs    03/12/21 0000  NA 142  K 4.7  CL 104  CO2 25*  BUN 11  CREATININE 0.8  CALCIUM 9.6   No results for input(s): AST, ALT, ALKPHOS, BILITOT, PROT, ALBUMIN in the last 8760 hours. Recent Labs    03/12/21 0000  WBC 7.0  HGB 15.2  HCT 44  PLT 206   Lab Results  Component Value Date   TSH 1.93 02/01/2019   Lab Results  Component Value Date   HGBA1C 5.3 02/21/2020   Lab Results  Component Value Date   CHOL 183 11/14/2017   HDL 64 11/14/2017   LDLCALC 102 11/14/2017   TRIG 86 11/14/2017   CHOLHDL 2.9 07/15/2016    Significant Diagnostic Results in last 30 days:  No results found.  Assessment/Plan 1. Other atopic dermatitis - papular rash over chest, breasts and upper legs - observed scratching often - start triamcinolone 0.1 %- apply to chest and upper legs QID x 14 days - start Claritin 10 mg po daily x 14 days to help with itching    Family/ staff Communication: plan discussed with nurse  Labs/tests ordered:  none

## 2021-06-07 ENCOUNTER — Encounter: Payer: Self-pay | Admitting: Adult Health

## 2021-06-07 ENCOUNTER — Non-Acute Institutional Stay (SKILLED_NURSING_FACILITY): Payer: Medicare Other | Admitting: Adult Health

## 2021-06-07 DIAGNOSIS — L304 Erythema intertrigo: Secondary | ICD-10-CM | POA: Diagnosis not present

## 2021-06-07 DIAGNOSIS — F0391 Unspecified dementia with behavioral disturbance: Secondary | ICD-10-CM | POA: Diagnosis not present

## 2021-06-07 DIAGNOSIS — W19XXXD Unspecified fall, subsequent encounter: Secondary | ICD-10-CM | POA: Diagnosis not present

## 2021-06-07 DIAGNOSIS — I680 Cerebral amyloid angiopathy: Secondary | ICD-10-CM

## 2021-06-07 DIAGNOSIS — I10 Essential (primary) hypertension: Secondary | ICD-10-CM | POA: Diagnosis not present

## 2021-06-07 DIAGNOSIS — F03918 Unspecified dementia, unspecified severity, with other behavioral disturbance: Secondary | ICD-10-CM

## 2021-06-07 DIAGNOSIS — L080 Pyoderma: Secondary | ICD-10-CM | POA: Diagnosis not present

## 2021-06-07 MED ORDER — FLUCONAZOLE 150 MG PO TABS: 150.0000 mg | ORAL_TABLET | Freq: Once | ORAL | 0 refills | Status: AC

## 2021-06-07 MED ORDER — DOXYCYCLINE HYCLATE 100 MG PO TABS: 100.0000 mg | ORAL_TABLET | Freq: Every day | ORAL | 0 refills | Status: DC

## 2021-06-07 MED ORDER — BENZOYL PEROXIDE WASH 5 % EX LIQD: Freq: Every day | CUTANEOUS | 12 refills | Status: AC

## 2021-06-07 NOTE — Progress Notes (Signed)
Location:  [facility] Room Number: 125-A  Place of Service:  SNF (31) Provider: Royal Hawthorn, NP  Patient Care Team: Royal Hawthorn, NP as PCP - General (Nurse Practitioner) Irine Seal, MD as Attending Physician (Urology) Jodi Marble, MD as Consulting Physician (Otolaryngology) Martinique, Amy, MD as Consulting Physician (Dermatology) Arta Silence, MD as Consulting Physician (Gastroenterology) Dohmeier, Asencion Partridge, MD as Consulting Physician (Neurology) Geanie Kenning, MD as Consulting Physician (Psychiatry)  Extended Emergency Contact Information Primary Emergency Contact: [name] Address: [address]          [place], [place] [address] [name] of [place] Phone: [phone] Mobile Phone: [phone] Relation: Spouse  Code Status: DNR Goals of care: Advanced Directive information Advanced Directives 06/07/2021  Does Patient Have a Medical Advance Directive? Yes  Type of Advance Directive Living will;Out of facility DNR (pink MOST or yellow form)  Does patient want to make changes to medical advance directive? No - Patient declined  Copy of [place] in Chart? -  Would patient like information on creating a medical advance directive? -  Pre-existing out of facility DNR order (yellow form or pink MOST form) Yellow form placed in chart (order not valid for inpatient use);Pink MOST form placed in chart (order not valid for inpatient use)     Chief Complaint  Patient presents with   Acute Visit    Rash    HPI:  Pt is a 80 y.o. female seen today for an acute visit for rash and chronic disease management.  PMH significant for cerebral amyloid angiopathy with dementia, carotid artery stenosis, renal stones, UTI, PBA, DDD, TIA, HTN, IBS, and constipation.  Nurse reports that she has a rash to her chest that is pustular. She has been trying kenolog cream with no relief. Also has a rash to the groin area.   She falls out of bed and is found on the floor mat. At times is restless other times sleeps a lot and is difficult to arouse. Minimally verbal. Not able to f/c.  Incontinent of B/B.  No episodes of crying, hitting, kicking   SBP 139-162  Past Medical History:  Diagnosis Date   Arthritis    back and neck   Carotid artery stenosis    Cerebral amyloid angiopathy (CODE) 06/20/2016   Chronic rhinosinusitis    DDD (degenerative disc disease)    Depression    Diverticulosis of colon    Frequency of urination    Glaucoma    Headache disorder 12/13/2014   Right temple   Hearing loss    History of kidney stones    MULTIBLE SURGICAL INTERVENTIONS   History of skin cancer HX TOPICAL FACIAL [place] 2013   History of TIA (transient ischemic attack)    NOV 2013-- NO RESIDUAL   Hypertension    IBS (irritable bowel syndrome)    Memory difficulty 05/14/2016   Memory loss    Mild asthma    COLD INDUCED   Right ureteral stone    Seasonal and perennial allergic rhinitis    Shingles    EYEBALL 09-17-2013   Spondylosis    Past Surgical History:  Procedure Laterality Date   BUNIONECTOMY     BILATERAL   CALDWELL LUC     sinus drainage procedure   CYSTO/ BILATERAL URETEROSCOPIC STONE EXTRACTIONS  02-09-2003   CYSTO/ BILATERAL URETEROSCOPY/  LASER OF STONES LEFT SIDE  06-02-2007   CYSTOSCOPY/RETROGRADE/URETEROSCOPY/STONE EXTRACTION WITH BASKET  03/31/2012   Procedure: CYSTOSCOPY/RETROGRADE/URETEROSCOPY/STONE EXTRACTION WITH  BASKET;  Surgeon: Malka So, MD;  Location: [facility];  Service: Urology;  Laterality: Right;    CYSTOSCOPY/RETROGRADE/URETEROSCOPY/STONE EXTRACTION WITH BASKET Right 03/29/2013   Procedure: CYSTOSCOPY//URETEROSCOPY/STONE EXTRACTION WITH BASKET;  Surgeon: Malka So, MD;  Location: [facility];  Service: Urology;  Laterality: Right;   CYSTOSCOPY/RETROGRADE/URETEROSCOPY/STONE EXTRACTION WITH BASKET Right 10/14/2013   Procedure:  CYSTOSCOPY/RETROGRADE/URETEROSCOPY/STONE EXTRACTION WITH BASKET;  Surgeon: Irine Seal, MD;  Location[facility];  Service: Urology;  Laterality: Right;   ESOPHAGOGASTRODUODENOSCOPY (EGD) WITH PROPOFOL N/A 04/12/2015   Procedure: ESOPHAGOGASTRODUODENOSCOPY (EGD) WITH PROPOFOL;  Surgeon: Arta Silence, MD;  Location[facility];  Service: Endoscopy;  Laterality: N/A;   EUS N/A 04/12/2015   Procedure: ESOPHAGEAL ENDOSCOPIC ULTRASOUND (EUS) RADIAL;  Surgeon: Arta Silence, MD;  [facility];  Service: Endoscopy;  Laterality: N/A;   EXTRACORPOREAL SHOCK WAVE LITHOTRIPSY  about 4665/ Dr Reece Agar   Never completed procedure-could not tolerate pain. Had seizures due to pain   EYE SURGERY  AGE 89   INJURY   HERNIA REPAIR  AGE 35   HOLMIUM LASER APPLICATION Right 9/93/5701   Procedure: HOLMIUM LASER APPLICATION;  Surgeon: Irine Seal, MD;  [facility];  Service: Urology;  Laterality: Right;   LAPAROSCOPIC CHOLECYSTECTOMY  01-12-2005   LEFT URETEROSCOPIC STONE EXTRACTION    LAST ONE 04-28-2009   SEVERAL SURGICAL STONE EXTRACTIONS   LUMBAR LAMINECTOMY  09-30-2002   L4 - 5;  SPONDYLOLISTHISES W/ STENOSIS AND RADICULOPATHY   PERCUTANEOUS NEPHROSTOLITHOTOMY  1975   RIGHT URETEROSCOPIC STONE EXTRACTION  LAST ONE 02-21-2011   MULTIPLE SURGICAL STONE EXTRACTIONS   TONSILECTOMY, ADENOIDECTOMY, BILATERAL MYRINGOTOMY AND TUBES  CHILD   VAGINAL HYSTERECTOMY  1991   W/ BILATERAL SALPINGOOPHECTOMY    Allergies  Allergen Reactions   Donepezil Hcl    Ace Inhibitors Cough   Hctz [Hydrochlorothiazide]     Bladder problems   Triptans     Avoid per neuro    Outpatient Encounter Medications as of 06/07/2021  Medication Sig   DULoxetine (CYMBALTA) 30 MG capsule Take 30 mg by mouth daily.   Loratadine 10 MG CAPS Take 10 mg by mouth daily.   polyethylene glycol (MIRALAX / GLYCOLAX) 17 g packet Take 17 g by mouth daily. With 8 oz of fluid   sennosides-docusate  sodium (SENOKOT-S) 8.6-50 MG tablet Take 2 tablets by mouth at bedtime.   triamcinolone cream (KENALOG) 0.1 % Apply 1 application topically 4 (four) times daily. Apply to chest and upper legs x 14 days   No facility-administered encounter medications on file as of 06/07/2021.    Review of Systems  Unable to perform ROS: Dementia   Immunization History  Administered Date(s) Administered   Hepatitis A 11/15/2003, 06/11/2004, 12/25/2012   Hepatitis B 11/14/2008, 12/15/2008   Influenza Split 06/17/2011   Influenza Whole 06/16/2010   Influenza, High Dose Seasonal PF 05/27/2016, 07/15/2019   Influenza,inj,Quad PF,6+ Mos 07/07/2018   Influenza-Unspecified 07/10/2017   Moderna SARS-COV2 Booster Vaccination 07/26/2020, 05/08/2021   Moderna Sars-Covid-2 Vaccination 09/21/2019, 10/19/2019   Pneumococcal Conjugate-13 08/09/2014   Pneumococcal Polysaccharide-23 04/22/2011, 11/15/2011   Tdap 11/14/2008, 12/25/2012   Typhoid Inactivated 12/25/2012   Yellow Fever 12/25/2012   Zoster Recombinat (Shingrix) 01/03/2017, 04/04/2017   Zoster, Live 05/07/2012   Pertinent  Health Maintenance Due  Topic Date Due   INFLUENZA VACCINE  04/16/2021   Fall Risk  11/17/2018 01/21/2018 10/29/2016  Falls in the past year? 1 Yes Yes  Number falls in past yr: 1 2  or more 2 or more  Injury with Fall? 0 No Yes  Risk for fall due to : History of fall(s);Impaired mobility;Medication side effect;Impaired balance/gait;Mental status change - -  Follow up Falls evaluation completed;Education provided;Falls prevention discussed - Falls prevention discussed  Comment with staff at [facility]:    Vitals:   06/07/21 0952  BP: (!) 167/88  Pulse: 84  Resp: 18  Temp: 98.3 F (36.8 C)  Weight: 145 lb (65.8 kg)  Height: 5\' 2"  (1.575 m)   Body mass index is 26.52 kg/m. Physical Exam Vitals and nursing note reviewed.  Constitutional:      General: She is not in acute distress.     Appearance: She is not diaphoretic.  HENT:     Head: Normocephalic and atraumatic.  Neck:     Vascular: No JVD.  Cardiovascular:     Rate and Rhythm: Normal rate and regular rhythm.     Heart sounds: No murmur heard. Pulmonary:     Effort: Pulmonary effort is normal. No respiratory distress.     Breath sounds: Normal breath sounds. No wheezing.  Abdominal:     General: Abdomen is flat. Bowel sounds are normal.     Palpations: Abdomen is soft.  Musculoskeletal:     Right lower leg: No edema.     Left lower leg: No edema.  Skin:    General: Skin is warm and dry.     Findings: Rash (pustular to chest) present.     Comments: Erythema to groin area.   Neurological:     General: No focal deficit present.     Mental Status: She is alert. Mental status is at baseline.    Labs reviewed: Recent Labs    03/12/21 0000  NA 142  K 4.7  CL 104  CO2 25*  BUN 11  CREATININE 0.8  CALCIUM 9.6   No results for input(s): AST, ALT, ALKPHOS, BILITOT, PROT, ALBUMIN in the last 8760 hours. Recent Labs    03/12/21 0000  WBC 7.0  HGB 15.2  HCT 44  PLT 206   Lab Results  Component Value Date   TSH 1.93 02/01/2019   Lab Results  Component Value Date   HGBA1C 5.3 02/21/2020   Lab Results  Component Value Date   CHOL 183 11/14/2017   HDL 64 11/14/2017   LDLCALC 102 11/14/2017   TRIG 86 11/14/2017   CHOLHDL 2.9 07/15/2016    Significant Diagnostic Results in last 30 days:  No results found.  Assessment/Plan  1. Pustular rash  - benzoyl peroxide 5 % external liquid; Apply topically daily for 10 days.  Dispense: 142 g; Refill: 12 - doxycycline (VIBRA-TABS) 100 MG tablet; Take 1 tablet (100 mg total) by mouth daily.  Dispense: 10 tablet; Refill: 0  2. Intertrigo  - fluconazole (DIFLUCAN) 150 MG tablet; Take 1 tablet (150 mg total) by mouth once for 1 dose.  Dispense: 1 tablet; Refill: 0  3. Essential hypertension Slight elevation off meds Goals of care comfort basd  4.  Chronic dementia, with behavioral disturbance ([place]) End stage DNR in place Most form indicating no hospitalizations  Progressive decline in cognition and physical function c/w the disease. Continue supportive care in the skilled environment.  5. Cerebral amyloid angiopathy (CODE) Led to #4  6. Fall, subsequent encounter Fall prec  Mat to floor Proper position Frequent checks and monitoring    Family/ staff Communication: called and left a message with her  husband Jenny Reichmann  Labs/tests ordered:  NA

## 2021-06-12 ENCOUNTER — Non-Acute Institutional Stay (SKILLED_NURSING_FACILITY): Payer: Medicare Other | Admitting: Internal Medicine

## 2021-06-12 ENCOUNTER — Encounter: Payer: Self-pay | Admitting: Internal Medicine

## 2021-06-12 DIAGNOSIS — F03918 Unspecified dementia, unspecified severity, with other behavioral disturbance: Secondary | ICD-10-CM

## 2021-06-12 DIAGNOSIS — I680 Cerebral amyloid angiopathy: Secondary | ICD-10-CM

## 2021-06-12 DIAGNOSIS — R1312 Dysphagia, oropharyngeal phase: Secondary | ICD-10-CM | POA: Diagnosis not present

## 2021-06-12 DIAGNOSIS — F0391 Unspecified dementia with behavioral disturbance: Secondary | ICD-10-CM | POA: Diagnosis not present

## 2021-06-12 NOTE — Progress Notes (Signed)
Location:   [facility] Room Number: 125 Place of Service:  SNF [phone]) Provider:  Veleta Miners MD  Royal Hawthorn, NP  Patient Care Team: Royal Hawthorn, NP as PCP - General (Nurse Practitioner) Irine Seal, MD as Attending Physician (Urology) Jodi Marble, MD as Consulting Physician (Otolaryngology) Martinique, Amy, MD as Consulting Physician (Dermatology) Arta Silence, MD as Consulting Physician (Gastroenterology) Dohmeier, Asencion Partridge, MD as Consulting Physician (Neurology) Geanie Kenning, MD as Consulting Physician (Psychiatry)  Extended Emergency Contact Information Primary Emergency Contact: [name] Address: [address]          [place], [place] [address] [name] of [place] Phone: [phone] Mobile Phone: [phone] Relation: Spouse  Code Status:  DNR Goals of care: Advanced Directive information Advanced Directives 06/12/2021  Does Patient Have a Medical Advance Directive? Yes  Type of Advance Directive Living will;Out of facility DNR (pink MOST or yellow form)  Does patient want to make changes to medical advance directive? No - Patient declined  Copy of [place] in Chart? -  Would patient like information on creating a medical advance directive? -  Pre-existing out of facility DNR order (yellow form or pink MOST form) Yellow form placed in chart (order not valid for inpatient use);Pink MOST form placed in chart (order not valid for inpatient use)     Chief Complaint  Patient presents with   Acute Visit    Behaviors    HPI:  Pt is a 80 y.o. female seen today for Acute Changes in Behavior, Restlessness and Fall  Patient has h/o Cerebral Amyloid Angiopathy with now End stage dementia and Depression with Dysphagia  She does not respond. Complete Dependent for her ADLS and Harrel Lemon Life Is on D 1 Diet I had discontinued her Zyprexa on my last visit Since then patient has been restless  especially at night  She even fell once  No Injuries POA does not want anything Aggressive    Past Medical History:  Diagnosis Date   Arthritis    back and neck   Carotid artery stenosis    Cerebral amyloid angiopathy (CODE) 06/20/2016   Chronic rhinosinusitis    DDD (degenerative disc disease)    Depression    Diverticulosis of colon    Frequency of urination    Glaucoma    Headache disorder 12/13/2014   Right temple   Hearing loss    History of kidney stones    MULTIBLE SURGICAL INTERVENTIONS   History of skin cancer HX TOPICAL FACIAL [place] 2013   History of TIA (transient ischemic attack)    NOV 2013-- NO RESIDUAL   Hypertension    IBS (irritable bowel syndrome)    Memory difficulty 05/14/2016   Memory loss    Mild asthma    COLD INDUCED   Right ureteral stone    Seasonal and perennial allergic rhinitis    Shingles    EYEBALL 09-17-2013   Spondylosis    Past Surgical History:  Procedure Laterality Date   BUNIONECTOMY     BILATERAL   CALDWELL LUC     sinus drainage procedure   CYSTO/ BILATERAL URETEROSCOPIC STONE EXTRACTIONS  02-09-2003   CYSTO/ BILATERAL URETEROSCOPY/  LASER OF STONES LEFT SIDE  06-02-2007   CYSTOSCOPY/RETROGRADE/URETEROSCOPY/STONE EXTRACTION WITH BASKET  03/31/2012   Procedure: CYSTOSCOPY/RETROGRADE/URETEROSCOPY/STONE EXTRACTION WITH BASKET;  Surgeon: Malka So, MD;  Location: [facility];  Service: Urology;  Laterality: Right;    CYSTOSCOPY/RETROGRADE/URETEROSCOPY/STONE EXTRACTION WITH BASKET Right 03/29/2013  Procedure: CYSTOSCOPY//URETEROSCOPY/STONE EXTRACTION WITH BASKET;  Surgeon: Malka So, MD;  Location[facility];  Service: Urology;  Laterality: Right;   CYSTOSCOPY/RETROGRADE/URETEROSCOPY/STONE EXTRACTION WITH BASKET Right 10/14/2013   Procedure: CYSTOSCOPY/RETROGRADE/URETEROSCOPY/STONE EXTRACTION WITH BASKET;  Surgeon: Irine Seal, MD;  Location: [facility];  Service: Urology;  Laterality: Right;    ESOPHAGOGASTRODUODENOSCOPY (EGD) WITH PROPOFOL N/A 04/12/2015   Procedure: ESOPHAGOGASTRODUODENOSCOPY (EGD) WITH PROPOFOL;  Surgeon: Arta Silence, MD;  [facility];  Service: Endoscopy;  Laterality: N/A;   EUS N/A 04/12/2015   Procedure: ESOPHAGEAL ENDOSCOPIC ULTRASOUND (EUS) RADIAL;  Surgeon: Arta Silence, MD;  Location[facility];  Service: Endoscopy;  Laterality: N/A;   EXTRACORPOREAL SHOCK WAVE LITHOTRIPSY  about 0981/ Dr Reece Agar   Never completed procedure-could not tolerate pain. Had seizures due to pain   EYE SURGERY  AGE 48   INJURY   HERNIA REPAIR  AGE 57   HOLMIUM LASER APPLICATION Right 1/91/4782   Procedure: HOLMIUM LASER APPLICATION;  Surgeon: Irine Seal, MD;  Location: [facility];  Service: Urology;  Laterality: Right;   LAPAROSCOPIC CHOLECYSTECTOMY  01-12-2005   LEFT URETEROSCOPIC STONE EXTRACTION    LAST ONE 04-28-2009   SEVERAL SURGICAL STONE EXTRACTIONS   LUMBAR LAMINECTOMY  09-30-2002   L4 - 5;  SPONDYLOLISTHISES W/ STENOSIS AND RADICULOPATHY   PERCUTANEOUS NEPHROSTOLITHOTOMY  1975   RIGHT URETEROSCOPIC STONE EXTRACTION  LAST ONE 02-21-2011   MULTIPLE SURGICAL STONE EXTRACTIONS   TONSILECTOMY, ADENOIDECTOMY, BILATERAL MYRINGOTOMY AND TUBES  CHILD   VAGINAL HYSTERECTOMY  1991   W/ BILATERAL SALPINGOOPHECTOMY    Allergies  Allergen Reactions   Donepezil Hcl    Ace Inhibitors Cough   Hctz [Hydrochlorothiazide]     Bladder problems   Triptans     Avoid per neuro    Allergies as of 06/12/2021       Reactions   Donepezil Hcl    Ace Inhibitors Cough   Hctz [hydrochlorothiazide]    Bladder problems   Triptans    Avoid per neuro        Medication List        Accurate as of June 12, 2021  1:38 PM. If you have any questions, ask your nurse or doctor.          benzoyl peroxide 5 % external liquid Generic drug: benzoyl peroxide Apply topically daily for 10 days.   doxycycline 100 MG tablet Commonly known as:  VIBRA-TABS Take 1 tablet (100 mg total) by mouth daily.   DULoxetine 30 MG capsule Commonly known as: CYMBALTA Take 30 mg by mouth daily.   Loratadine 10 MG Caps Take 10 mg by mouth daily.   OLANZapine 2.5 MG tablet Commonly known as: ZYPREXA Take 2.5 mg by mouth at bedtime.   polyethylene glycol 17 g packet Commonly known as: MIRALAX / GLYCOLAX Take 17 g by mouth daily. With 8 oz of fluid   sennosides-docusate sodium 8.6-50 MG tablet Commonly known as: SENOKOT-S Take 2 tablets by mouth at bedtime.   triamcinolone cream 0.1 % Commonly known as: KENALOG Apply 1 application topically 4 (four) times daily. Apply to chest and upper legs x 14 days        Review of Systems  Unable to perform ROS: Dementia   Immunization History  Administered Date(s) Administered   Hepatitis A 11/15/2003, 06/11/2004, 12/25/2012   Hepatitis B 11/14/2008, 12/15/2008   Influenza Split 06/17/2011   Influenza Whole 06/16/2010   Influenza, High Dose Seasonal PF 05/27/2016, 07/15/2019   Influenza,inj,Quad PF,6+  Mos 07/07/2018   Influenza-Unspecified 07/10/2017   Moderna SARS-COV2 Booster Vaccination 07/26/2020, 05/08/2021   Moderna Sars-Covid-2 Vaccination 09/21/2019, 10/19/2019   Pneumococcal Conjugate-13 08/09/2014   Pneumococcal Polysaccharide-23 04/22/2011, 11/15/2011   Tdap 11/14/2008, 12/25/2012   Typhoid Inactivated 12/25/2012   Yellow Fever 12/25/2012   Zoster Recombinat (Shingrix) 01/03/2017, 04/04/2017   Zoster, Live 05/07/2012   Pertinent  Health Maintenance Due  Topic Date Due   INFLUENZA VACCINE  04/16/2021   Fall Risk  11/17/2018 01/21/2018 10/29/2016  Falls in the past year? 1 Yes Yes  Number falls in past yr: 1 2 or more 2 or more  Injury with Fall? 0 No Yes  Risk for fall due to : History of fall(s);Impaired mobility;Medication side effect;Impaired balance/gait;Mental status change - -  Follow up Falls evaluation completed;Education provided;Falls prevention discussed -  Falls prevention discussed  Comment with staff at [facility]:    Vitals:   06/12/21 1109  BP: (!) 142/88  Pulse: 89  Resp: 18  Temp: 97.9 F (36.6 C)  SpO2: 94%  Weight: 145 lb (65.8 kg)  Height: 5\' 2"  (1.575 m)   Body mass index is 26.52 kg/m. Physical Exam Vitals reviewed.  HENT:     Head: Normocephalic.     Nose: Nose normal.     Mouth/Throat:     Mouth: Mucous membranes are moist.     Pharynx: Oropharynx is clear.  Eyes:     Pupils: Pupils are equal, round, and reactive to light.  Cardiovascular:     Rate and Rhythm: Normal rate.     Pulses: Normal pulses.     Heart sounds: Normal heart sounds.  Pulmonary:     Effort: Pulmonary effort is normal.     Breath sounds: Normal breath sounds.  Abdominal:     General: Abdomen is flat. Bowel sounds are normal.     Palpations: Abdomen is soft.  Musculoskeletal:        General: Swelling present.     Cervical back: Neck supple.  Skin:    General: Skin is warm.  Neurological:     Mental Status: She is alert.     Comments: Does not respond. Her eyes were open Unable to do more exam   Psychiatric:        Mood and Affect: Mood normal.    Labs reviewed: Recent Labs    03/12/21 0000  NA 142  K 4.7  CL 104  CO2 25*  BUN 11  CREATININE 0.8  CALCIUM 9.6   No results for input(s): AST, ALT, ALKPHOS, BILITOT, PROT, ALBUMIN in the last 8760 hours. Recent Labs    03/12/21 0000  WBC 7.0  HGB 15.2  HCT 44  PLT 206   Lab Results  Component Value Date   TSH 1.93 02/01/2019   Lab Results  Component Value Date   HGBA1C 5.3 02/21/2020   Lab Results  Component Value Date   CHOL 183 11/14/2017   HDL 64 11/14/2017   LDLCALC 102 11/14/2017   TRIG 86 11/14/2017   CHOLHDL 2.9 07/15/2016    Significant Diagnostic Results in last 30 days:  No results found.  Assessment/Plan Patient with End stage dementia and Total Care with Dysphagia Will Restart her Zyprexa And also do  Hospice consult for weight loss and Dementia if ok with POA  Patient in Doxycyline for Infected Pustules on her chest    Family/ staff Communication:   Labs/tests ordered:

## 2021-06-14 DIAGNOSIS — G309 Alzheimer's disease, unspecified: Secondary | ICD-10-CM | POA: Diagnosis not present

## 2021-06-14 DIAGNOSIS — M1990 Unspecified osteoarthritis, unspecified site: Secondary | ICD-10-CM | POA: Diagnosis not present

## 2021-06-14 DIAGNOSIS — I6529 Occlusion and stenosis of unspecified carotid artery: Secondary | ICD-10-CM | POA: Diagnosis not present

## 2021-06-14 DIAGNOSIS — R1310 Dysphagia, unspecified: Secondary | ICD-10-CM | POA: Diagnosis not present

## 2021-06-14 DIAGNOSIS — Z8673 Personal history of transient ischemic attack (TIA), and cerebral infarction without residual deficits: Secondary | ICD-10-CM | POA: Diagnosis not present

## 2021-06-14 DIAGNOSIS — Z87442 Personal history of urinary calculi: Secondary | ICD-10-CM | POA: Diagnosis not present

## 2021-06-14 DIAGNOSIS — F323 Major depressive disorder, single episode, severe with psychotic features: Secondary | ICD-10-CM | POA: Diagnosis not present

## 2021-06-14 DIAGNOSIS — F0281 Dementia in other diseases classified elsewhere with behavioral disturbance: Secondary | ICD-10-CM | POA: Diagnosis not present

## 2021-06-14 DIAGNOSIS — J45909 Unspecified asthma, uncomplicated: Secondary | ICD-10-CM | POA: Diagnosis not present

## 2021-06-14 DIAGNOSIS — I680 Cerebral amyloid angiopathy: Secondary | ICD-10-CM | POA: Diagnosis not present

## 2021-06-14 DIAGNOSIS — L0390 Cellulitis, unspecified: Secondary | ICD-10-CM | POA: Diagnosis not present

## 2021-06-14 DIAGNOSIS — F411 Generalized anxiety disorder: Secondary | ICD-10-CM | POA: Diagnosis not present

## 2021-06-14 DIAGNOSIS — K589 Irritable bowel syndrome without diarrhea: Secondary | ICD-10-CM | POA: Diagnosis not present

## 2021-06-14 DIAGNOSIS — E854 Organ-limited amyloidosis: Secondary | ICD-10-CM | POA: Diagnosis not present

## 2021-06-14 DIAGNOSIS — R296 Repeated falls: Secondary | ICD-10-CM | POA: Diagnosis not present

## 2021-06-15 DIAGNOSIS — R296 Repeated falls: Secondary | ICD-10-CM | POA: Diagnosis not present

## 2021-06-15 DIAGNOSIS — L0390 Cellulitis, unspecified: Secondary | ICD-10-CM | POA: Diagnosis not present

## 2021-06-15 DIAGNOSIS — F0281 Dementia in other diseases classified elsewhere with behavioral disturbance: Secondary | ICD-10-CM | POA: Diagnosis not present

## 2021-06-15 DIAGNOSIS — R1310 Dysphagia, unspecified: Secondary | ICD-10-CM | POA: Diagnosis not present

## 2021-06-15 DIAGNOSIS — F323 Major depressive disorder, single episode, severe with psychotic features: Secondary | ICD-10-CM | POA: Diagnosis not present

## 2021-06-15 DIAGNOSIS — G309 Alzheimer's disease, unspecified: Secondary | ICD-10-CM | POA: Diagnosis not present

## 2021-06-16 DIAGNOSIS — I6529 Occlusion and stenosis of unspecified carotid artery: Secondary | ICD-10-CM | POA: Diagnosis not present

## 2021-06-16 DIAGNOSIS — G309 Alzheimer's disease, unspecified: Secondary | ICD-10-CM | POA: Diagnosis not present

## 2021-06-16 DIAGNOSIS — R296 Repeated falls: Secondary | ICD-10-CM | POA: Diagnosis not present

## 2021-06-16 DIAGNOSIS — F0284 Dementia in other diseases classified elsewhere, unspecified severity, with anxiety: Secondary | ICD-10-CM | POA: Diagnosis not present

## 2021-06-16 DIAGNOSIS — K589 Irritable bowel syndrome without diarrhea: Secondary | ICD-10-CM | POA: Diagnosis not present

## 2021-06-16 DIAGNOSIS — R1310 Dysphagia, unspecified: Secondary | ICD-10-CM | POA: Diagnosis not present

## 2021-06-16 DIAGNOSIS — J45909 Unspecified asthma, uncomplicated: Secondary | ICD-10-CM | POA: Diagnosis not present

## 2021-06-16 DIAGNOSIS — E854 Organ-limited amyloidosis: Secondary | ICD-10-CM | POA: Diagnosis not present

## 2021-06-16 DIAGNOSIS — Z8673 Personal history of transient ischemic attack (TIA), and cerebral infarction without residual deficits: Secondary | ICD-10-CM | POA: Diagnosis not present

## 2021-06-16 DIAGNOSIS — F0282 Dementia in other diseases classified elsewhere, unspecified severity, with psychotic disturbance: Secondary | ICD-10-CM | POA: Diagnosis not present

## 2021-06-16 DIAGNOSIS — Z87442 Personal history of urinary calculi: Secondary | ICD-10-CM | POA: Diagnosis not present

## 2021-06-16 DIAGNOSIS — L0390 Cellulitis, unspecified: Secondary | ICD-10-CM | POA: Diagnosis not present

## 2021-06-16 DIAGNOSIS — I680 Cerebral amyloid angiopathy: Secondary | ICD-10-CM | POA: Diagnosis not present

## 2021-06-16 DIAGNOSIS — M1990 Unspecified osteoarthritis, unspecified site: Secondary | ICD-10-CM | POA: Diagnosis not present

## 2021-06-16 DIAGNOSIS — F0283 Dementia in other diseases classified elsewhere, unspecified severity, with mood disturbance: Secondary | ICD-10-CM | POA: Diagnosis not present

## 2021-06-18 DIAGNOSIS — G309 Alzheimer's disease, unspecified: Secondary | ICD-10-CM | POA: Diagnosis not present

## 2021-06-18 DIAGNOSIS — F0283 Dementia in other diseases classified elsewhere, unspecified severity, with mood disturbance: Secondary | ICD-10-CM | POA: Diagnosis not present

## 2021-06-18 DIAGNOSIS — R296 Repeated falls: Secondary | ICD-10-CM | POA: Diagnosis not present

## 2021-06-18 DIAGNOSIS — L0390 Cellulitis, unspecified: Secondary | ICD-10-CM | POA: Diagnosis not present

## 2021-06-18 DIAGNOSIS — R1310 Dysphagia, unspecified: Secondary | ICD-10-CM | POA: Diagnosis not present

## 2021-06-18 DIAGNOSIS — F0284 Dementia in other diseases classified elsewhere, unspecified severity, with anxiety: Secondary | ICD-10-CM | POA: Diagnosis not present

## 2021-06-20 DIAGNOSIS — Z23 Encounter for immunization: Secondary | ICD-10-CM | POA: Diagnosis not present

## 2021-06-22 DIAGNOSIS — G309 Alzheimer's disease, unspecified: Secondary | ICD-10-CM | POA: Diagnosis not present

## 2021-06-22 DIAGNOSIS — R1310 Dysphagia, unspecified: Secondary | ICD-10-CM | POA: Diagnosis not present

## 2021-06-22 DIAGNOSIS — R296 Repeated falls: Secondary | ICD-10-CM | POA: Diagnosis not present

## 2021-06-22 DIAGNOSIS — F0283 Dementia in other diseases classified elsewhere, unspecified severity, with mood disturbance: Secondary | ICD-10-CM | POA: Diagnosis not present

## 2021-06-22 DIAGNOSIS — L0390 Cellulitis, unspecified: Secondary | ICD-10-CM | POA: Diagnosis not present

## 2021-06-22 DIAGNOSIS — F0284 Dementia in other diseases classified elsewhere, unspecified severity, with anxiety: Secondary | ICD-10-CM | POA: Diagnosis not present

## 2021-06-25 DIAGNOSIS — R296 Repeated falls: Secondary | ICD-10-CM | POA: Diagnosis not present

## 2021-06-25 DIAGNOSIS — R1310 Dysphagia, unspecified: Secondary | ICD-10-CM | POA: Diagnosis not present

## 2021-06-25 DIAGNOSIS — G309 Alzheimer's disease, unspecified: Secondary | ICD-10-CM | POA: Diagnosis not present

## 2021-06-25 DIAGNOSIS — F0284 Dementia in other diseases classified elsewhere, unspecified severity, with anxiety: Secondary | ICD-10-CM | POA: Diagnosis not present

## 2021-06-25 DIAGNOSIS — F0283 Dementia in other diseases classified elsewhere, unspecified severity, with mood disturbance: Secondary | ICD-10-CM | POA: Diagnosis not present

## 2021-06-25 DIAGNOSIS — L0390 Cellulitis, unspecified: Secondary | ICD-10-CM | POA: Diagnosis not present

## 2021-06-30 DIAGNOSIS — L0390 Cellulitis, unspecified: Secondary | ICD-10-CM | POA: Diagnosis not present

## 2021-06-30 DIAGNOSIS — R296 Repeated falls: Secondary | ICD-10-CM | POA: Diagnosis not present

## 2021-06-30 DIAGNOSIS — F0283 Dementia in other diseases classified elsewhere, unspecified severity, with mood disturbance: Secondary | ICD-10-CM | POA: Diagnosis not present

## 2021-06-30 DIAGNOSIS — F0284 Dementia in other diseases classified elsewhere, unspecified severity, with anxiety: Secondary | ICD-10-CM | POA: Diagnosis not present

## 2021-06-30 DIAGNOSIS — R1310 Dysphagia, unspecified: Secondary | ICD-10-CM | POA: Diagnosis not present

## 2021-06-30 DIAGNOSIS — G309 Alzheimer's disease, unspecified: Secondary | ICD-10-CM | POA: Diagnosis not present

## 2021-07-02 ENCOUNTER — Encounter: Payer: Self-pay | Admitting: Adult Health

## 2021-07-02 ENCOUNTER — Non-Acute Institutional Stay (SKILLED_NURSING_FACILITY): Payer: Medicare Other | Admitting: Adult Health

## 2021-07-02 DIAGNOSIS — R1312 Dysphagia, oropharyngeal phase: Secondary | ICD-10-CM | POA: Diagnosis not present

## 2021-07-02 DIAGNOSIS — F0283 Dementia in other diseases classified elsewhere, unspecified severity, with mood disturbance: Secondary | ICD-10-CM | POA: Diagnosis not present

## 2021-07-02 DIAGNOSIS — R296 Repeated falls: Secondary | ICD-10-CM | POA: Diagnosis not present

## 2021-07-02 DIAGNOSIS — F03918 Unspecified dementia, unspecified severity, with other behavioral disturbance: Secondary | ICD-10-CM | POA: Diagnosis not present

## 2021-07-02 DIAGNOSIS — E210 Primary hyperparathyroidism: Secondary | ICD-10-CM | POA: Diagnosis not present

## 2021-07-02 DIAGNOSIS — F482 Pseudobulbar affect: Secondary | ICD-10-CM | POA: Diagnosis not present

## 2021-07-02 DIAGNOSIS — F0284 Dementia in other diseases classified elsewhere, unspecified severity, with anxiety: Secondary | ICD-10-CM | POA: Diagnosis not present

## 2021-07-02 DIAGNOSIS — K5901 Slow transit constipation: Secondary | ICD-10-CM | POA: Diagnosis not present

## 2021-07-02 DIAGNOSIS — L0390 Cellulitis, unspecified: Secondary | ICD-10-CM | POA: Diagnosis not present

## 2021-07-02 DIAGNOSIS — I680 Cerebral amyloid angiopathy: Secondary | ICD-10-CM

## 2021-07-02 DIAGNOSIS — I10 Essential (primary) hypertension: Secondary | ICD-10-CM

## 2021-07-02 DIAGNOSIS — G309 Alzheimer's disease, unspecified: Secondary | ICD-10-CM | POA: Diagnosis not present

## 2021-07-02 DIAGNOSIS — R1310 Dysphagia, unspecified: Secondary | ICD-10-CM | POA: Diagnosis not present

## 2021-07-02 NOTE — Progress Notes (Signed)
Location:  [facility]up[facility] psychologist of Service:  SNF (31) Provider:   Cindi Carbon, [place] [phone]   Royal Hawthorn, NP  Patient Care Team: Royal Hawthorn, NP as PCP - General (Nurse Practitioner) Irine Seal, MD as Attending Physician (Urology) Jodi Marble, MD as Consulting Physician (Otolaryngology) Martinique, Amy, MD as Consulting Physician (Dermatology) Arta Silence, MD as Consulting Physician (Gastroenterology) Dohmeier, Asencion Partridge, MD as Consulting Physician (Neurology) Geanie Kenning, MD as Consulting Physician (Psychiatry)  Extended Emergency Contact Information Primary Emergency Contact: [name] Address: [address]          [place], [place] [address] [name] of [place] Phone: [phone] Mobile Phone: [phone] Relation: Spouse  Code Status:  DNR Goals of care: Advanced Directive information Advanced Directives 06/12/2021  Does Patient Have a Medical Advance Directive? Yes  Type of Advance Directive Living will;Out of facility DNR (pink MOST or yellow form)  Does patient want to make changes to medical advance directive? No - Patient declined  Copy of [place] in Chart? -  Would patient like information on creating a medical advance directive? -  Pre-existing out of facility DNR order (yellow form or pink MOST form) Yellow form placed in chart (order not valid for inpatient use);Pink MOST form placed in chart (order not valid for inpatient use)     Chief Complaint  Patient presents with   Medical Management of Chronic Issues    HPI:  Pt is a 80 y.o. female seen today for medical management of chronic diseases.   PMH significant for cerebral amyloid angiopathy with dementia, carotid artery stenosis, renal stones, UTI, PBA, DDD, TIA, HTN, IBS, and constipation. She is followed by hospice for dementia.   She was treated for a pustular rash last month with doxycycline and it  seems to have resolved.  No new acute complaints  Was taken off zyprexa then she had some restless and it was restarted one month ago.   Continues on a D1 diet with no coughing or choking.   Needs assistance with a ADLs hoyer lift, incontinent.   Last fall 10/7 due to getting out of bed without help Wt Readings from Last 3 Encounters:  07/02/21 141 lb 6.4 oz (64.1 kg)  06/12/21 145 lb (65.8 kg)  06/07/21 145 lb (65.8 kg)   BPs 150/93 150/81   Past Medical History:  Diagnosis Date   Arthritis    back and neck   Carotid artery stenosis    Cerebral amyloid angiopathy (CODE) 06/20/2016   Chronic rhinosinusitis    DDD (degenerative disc disease)    Depression    Diverticulosis of colon    Frequency of urination    Glaucoma    Headache disorder 12/13/2014   Right temple   Hearing loss    History of kidney stones    MULTIBLE SURGICAL INTERVENTIONS   History of skin cancer HX TOPICAL FACIAL [place] 2013   History of TIA (transient ischemic attack)    NOV 2013-- NO RESIDUAL   Hypertension    IBS (irritable bowel syndrome)    Memory difficulty 05/14/2016   Memory loss    Mild asthma    COLD INDUCED   Right ureteral stone    Seasonal and perennial allergic rhinitis    Shingles    EYEBALL 09-17-2013   Spondylosis    Past Surgical History:  Procedure Laterality Date   BUNIONECTOMY     BILATERAL   CALDWELL LUC  sinus drainage procedure   CYSTO/ BILATERAL URETEROSCOPIC STONE EXTRACTIONS  02-09-2003   CYSTO/ BILATERAL URETEROSCOPY/  LASER OF STONES LEFT SIDE  06-02-2007   CYSTOSCOPY/RETROGRADE/URETEROSCOPY/STONE EXTRACTION WITH BASKET  03/31/2012   Procedure: CYSTOSCOPY/RETROGRADE/URETEROSCOPY/STONE EXTRACTION WITH BASKET;  Surgeon: Malka So, MD;  Location: [facility];  Service: Urology;  Laterality: Right;    CYSTOSCOPY/RETROGRADE/URETEROSCOPY/STONE EXTRACTION WITH BASKET Right 03/29/2013   Procedure: CYSTOSCOPY//URETEROSCOPY/STONE EXTRACTION WITH BASKET;   Surgeon: Malka So, MD;  Location: [facility];  Service: Urology;  Laterality: Right;   CYSTOSCOPY/RETROGRADE/URETEROSCOPY/STONE EXTRACTION WITH BASKET Right 10/14/2013   Procedure: CYSTOSCOPY/RETROGRADE/URETEROSCOPY/STONE EXTRACTION WITH BASKET;  Surgeon: Irine Seal, MD;  Location: [facility];  Service: Urology;  Laterality: Right;   ESOPHAGOGASTRODUODENOSCOPY (EGD) WITH PROPOFOL N/A 04/12/2015   Procedure: ESOPHAGOGASTRODUODENOSCOPY (EGD) WITH PROPOFOL;  Surgeon: Arta Silence, MD;  Location[facility];  Service: Endoscopy;  Laterality: N/A;   EUS N/A 04/12/2015   Procedure: ESOPHAGEAL ENDOSCOPIC ULTRASOUND (EUS) RADIAL;  Surgeon: Arta Silence, MD;  [facility];  Service: Endoscopy;  Laterality: N/A;   EXTRACORPOREAL SHOCK WAVE LITHOTRIPSY  about 1914/ Dr Reece Agar   Never completed procedure-could not tolerate pain. Had seizures due to pain   EYE SURGERY  AGE 89   INJURY   HERNIA REPAIR  AGE 23   HOLMIUM LASER APPLICATION Right 7/82/9562   Procedure: HOLMIUM LASER APPLICATION;  Surgeon: Irine Seal, MD;  [facility];  Service: Urology;  Laterality: Right;   LAPAROSCOPIC CHOLECYSTECTOMY  01-12-2005   LEFT URETEROSCOPIC STONE EXTRACTION    LAST ONE 04-28-2009   SEVERAL SURGICAL STONE EXTRACTIONS   LUMBAR LAMINECTOMY  09-30-2002   L4 - 5;  SPONDYLOLISTHISES W/ STENOSIS AND RADICULOPATHY   PERCUTANEOUS NEPHROSTOLITHOTOMY  1975   RIGHT URETEROSCOPIC STONE EXTRACTION  LAST ONE 02-21-2011   MULTIPLE SURGICAL STONE EXTRACTIONS   TONSILECTOMY, ADENOIDECTOMY, BILATERAL MYRINGOTOMY AND TUBES  CHILD   VAGINAL HYSTERECTOMY  1991   W/ BILATERAL SALPINGOOPHECTOMY    Allergies  Allergen Reactions   Donepezil Hcl    Ace Inhibitors Cough   Hctz [Hydrochlorothiazide]     Bladder problems   Triptans     Avoid per neuro    Outpatient Encounter Medications as of 07/02/2021  Medication Sig   LORazepam (ATIVAN) 0.5 MG tablet Take 0.5 mg by mouth  every 6 (six) hours as needed for anxiety.   DULoxetine (CYMBALTA) 30 MG capsule Take 30 mg by mouth daily.   OLANZapine (ZYPREXA) 2.5 MG tablet Take 2.5 mg by mouth at bedtime.   polyethylene glycol (MIRALAX / GLYCOLAX) 17 g packet Take 17 g by mouth daily. With 8 oz of fluid   sennosides-docusate sodium (SENOKOT-S) 8.6-50 MG tablet Take 2 tablets by mouth at bedtime.   triamcinolone cream (KENALOG) 0.1 % Apply 1 application topically 4 (four) times daily. Apply to chest and upper legs x 14 days   [DISCONTINUED] doxycycline (VIBRA-TABS) 100 MG tablet Take 1 tablet (100 mg total) by mouth daily.   [DISCONTINUED] Loratadine 10 MG CAPS Take 10 mg by mouth daily.   No facility-administered encounter medications on file as of 07/02/2021.    Review of Systems  Unable to perform ROS: Dementia   Immunization History  Administered Date(s) Administered   Hepatitis A 11/15/2003, 06/11/2004, 12/25/2012   Hepatitis B 11/14/2008, 12/15/2008   Influenza Split 06/17/2011   Influenza Whole 06/16/2010   Influenza, High Dose Seasonal PF 05/27/2016, 07/15/2019   Influenza,inj,Quad PF,6+ Mos 07/07/2018   Influenza-Unspecified 07/10/2017   Moderna SARS-COV2  Booster Vaccination 07/26/2020, 05/08/2021   Moderna Sars-Covid-2 Vaccination 09/21/2019, 10/19/2019   Pneumococcal Conjugate-13 08/09/2014   Pneumococcal Polysaccharide-23 04/22/2011, 11/15/2011   Tdap 11/14/2008, 12/25/2012   Typhoid Inactivated 12/25/2012   Yellow Fever 12/25/2012   Zoster Recombinat (Shingrix) 01/03/2017, 04/04/2017   Zoster, Live 05/07/2012   Pertinent  Health Maintenance Due  Topic Date Due   INFLUENZA VACCINE  04/16/2021   Fall Risk  11/17/2018 01/21/2018 10/29/2016  Falls in the past year? 1 Yes Yes  Number falls in past yr: 1 2 or more 2 or more  Injury with Fall? 0 No Yes  Risk for fall due to : History of fall(s);Impaired mobility;Medication side effect;Impaired balance/gait;Mental status change - -  Follow up Falls  evaluation completed;Education provided;Falls prevention discussed - Falls prevention discussed  Comment with staff at [employer]:    Vitals:   07/02/21 1305  Weight: 141 lb 6.4 oz (64.1 kg)   Body mass index is 25.86 kg/m. Physical Exam Vitals and nursing note reviewed.  Constitutional:      General: She is not in acute distress.    Appearance: She is not diaphoretic.  HENT:     Head: Normocephalic and atraumatic.     Nose: Nose normal. No congestion.     Mouth/Throat:     Comments: Refused oral exam  Neck:     Vascular: No JVD.  Cardiovascular:     Rate and Rhythm: Normal rate and regular rhythm.     Heart sounds: No murmur heard. Pulmonary:     Effort: Pulmonary effort is normal. No respiratory distress.     Breath sounds: Normal breath sounds. No wheezing.  Abdominal:     General: Bowel sounds are normal. There is no distension.     Palpations: Abdomen is soft.     Tenderness: There is no abdominal tenderness.  Musculoskeletal:     Right lower leg: No edema.     Left lower leg: No edema.  Skin:    General: Skin is warm and dry.  Neurological:     General: No focal deficit present.     Mental Status: She is alert. Mental status is at baseline.  Psychiatric:     Comments: Flat stares in to space    Labs reviewed: Recent Labs    03/12/21 0000  NA 142  K 4.7  CL 104  CO2 25*  BUN 11  CREATININE 0.8  CALCIUM 9.6   No results for input(s): AST, ALT, ALKPHOS, BILITOT, PROT, ALBUMIN in the last 8760 hours. Recent Labs    03/12/21 0000  WBC 7.0  HGB 15.2  HCT 44  PLT 206   Lab Results  Component Value Date   TSH 1.93 02/01/2019   Lab Results  Component Value Date   HGBA1C 5.3 02/21/2020   Lab Results  Component Value Date   CHOL 183 11/14/2017   HDL 64 11/14/2017   LDLCALC 102 11/14/2017   TRIG 86 11/14/2017   CHOLHDL 2.9 07/15/2016    Significant Diagnostic Results in last 30 days:  No results  found.  Assessment/Plan 1. Cerebral amyloid angiopathy (CODE) Led to progressive dementia. Severe stage, now followed by hospice.   2. Oropharyngeal dysphagia Continue D1 diet  Asp prec 1:1 sypervision   3. Chronic dementia, with behavioral disturbance See #1  4. Hyperparathyroidism, primary ([place]) Ca WNL. Has elevated PTH, labs checked annually   5. PBA (pseudobulbar affect) Not currently an issue  6. Slow transit  constipation Continue miralax and senokot   7. Essential hypertension Slight elevation off tekturna. Would not treat due to goals of care and debility     Family/ staff Communication: nurse  Labs/tests ordered:  NA

## 2021-07-09 DIAGNOSIS — R296 Repeated falls: Secondary | ICD-10-CM | POA: Diagnosis not present

## 2021-07-09 DIAGNOSIS — L0390 Cellulitis, unspecified: Secondary | ICD-10-CM | POA: Diagnosis not present

## 2021-07-09 DIAGNOSIS — R1310 Dysphagia, unspecified: Secondary | ICD-10-CM | POA: Diagnosis not present

## 2021-07-09 DIAGNOSIS — F0283 Dementia in other diseases classified elsewhere, unspecified severity, with mood disturbance: Secondary | ICD-10-CM | POA: Diagnosis not present

## 2021-07-09 DIAGNOSIS — G309 Alzheimer's disease, unspecified: Secondary | ICD-10-CM | POA: Diagnosis not present

## 2021-07-09 DIAGNOSIS — F0284 Dementia in other diseases classified elsewhere, unspecified severity, with anxiety: Secondary | ICD-10-CM | POA: Diagnosis not present

## 2021-07-10 DIAGNOSIS — F0284 Dementia in other diseases classified elsewhere, unspecified severity, with anxiety: Secondary | ICD-10-CM | POA: Diagnosis not present

## 2021-07-10 DIAGNOSIS — R1310 Dysphagia, unspecified: Secondary | ICD-10-CM | POA: Diagnosis not present

## 2021-07-10 DIAGNOSIS — L0390 Cellulitis, unspecified: Secondary | ICD-10-CM | POA: Diagnosis not present

## 2021-07-10 DIAGNOSIS — F0283 Dementia in other diseases classified elsewhere, unspecified severity, with mood disturbance: Secondary | ICD-10-CM | POA: Diagnosis not present

## 2021-07-10 DIAGNOSIS — G309 Alzheimer's disease, unspecified: Secondary | ICD-10-CM | POA: Diagnosis not present

## 2021-07-10 DIAGNOSIS — R296 Repeated falls: Secondary | ICD-10-CM | POA: Diagnosis not present

## 2021-07-17 DIAGNOSIS — G309 Alzheimer's disease, unspecified: Secondary | ICD-10-CM | POA: Diagnosis not present

## 2021-07-17 DIAGNOSIS — F0283 Dementia in other diseases classified elsewhere, unspecified severity, with mood disturbance: Secondary | ICD-10-CM | POA: Diagnosis not present

## 2021-07-17 DIAGNOSIS — F0282 Dementia in other diseases classified elsewhere, unspecified severity, with psychotic disturbance: Secondary | ICD-10-CM | POA: Diagnosis not present

## 2021-07-17 DIAGNOSIS — Z8673 Personal history of transient ischemic attack (TIA), and cerebral infarction without residual deficits: Secondary | ICD-10-CM | POA: Diagnosis not present

## 2021-07-17 DIAGNOSIS — F0284 Dementia in other diseases classified elsewhere, unspecified severity, with anxiety: Secondary | ICD-10-CM | POA: Diagnosis not present

## 2021-07-17 DIAGNOSIS — K589 Irritable bowel syndrome without diarrhea: Secondary | ICD-10-CM | POA: Diagnosis not present

## 2021-07-17 DIAGNOSIS — R1310 Dysphagia, unspecified: Secondary | ICD-10-CM | POA: Diagnosis not present

## 2021-07-17 DIAGNOSIS — L0390 Cellulitis, unspecified: Secondary | ICD-10-CM | POA: Diagnosis not present

## 2021-07-17 DIAGNOSIS — I6529 Occlusion and stenosis of unspecified carotid artery: Secondary | ICD-10-CM | POA: Diagnosis not present

## 2021-07-17 DIAGNOSIS — Z87442 Personal history of urinary calculi: Secondary | ICD-10-CM | POA: Diagnosis not present

## 2021-07-17 DIAGNOSIS — J45909 Unspecified asthma, uncomplicated: Secondary | ICD-10-CM | POA: Diagnosis not present

## 2021-07-17 DIAGNOSIS — R296 Repeated falls: Secondary | ICD-10-CM | POA: Diagnosis not present

## 2021-07-17 DIAGNOSIS — I680 Cerebral amyloid angiopathy: Secondary | ICD-10-CM | POA: Diagnosis not present

## 2021-07-17 DIAGNOSIS — M1990 Unspecified osteoarthritis, unspecified site: Secondary | ICD-10-CM | POA: Diagnosis not present

## 2021-07-17 DIAGNOSIS — E854 Organ-limited amyloidosis: Secondary | ICD-10-CM | POA: Diagnosis not present

## 2021-07-20 DIAGNOSIS — F0283 Dementia in other diseases classified elsewhere, unspecified severity, with mood disturbance: Secondary | ICD-10-CM | POA: Diagnosis not present

## 2021-07-20 DIAGNOSIS — L0390 Cellulitis, unspecified: Secondary | ICD-10-CM | POA: Diagnosis not present

## 2021-07-20 DIAGNOSIS — F0284 Dementia in other diseases classified elsewhere, unspecified severity, with anxiety: Secondary | ICD-10-CM | POA: Diagnosis not present

## 2021-07-20 DIAGNOSIS — R1310 Dysphagia, unspecified: Secondary | ICD-10-CM | POA: Diagnosis not present

## 2021-07-20 DIAGNOSIS — R296 Repeated falls: Secondary | ICD-10-CM | POA: Diagnosis not present

## 2021-07-20 DIAGNOSIS — G309 Alzheimer's disease, unspecified: Secondary | ICD-10-CM | POA: Diagnosis not present

## 2021-07-23 DIAGNOSIS — F0284 Dementia in other diseases classified elsewhere, unspecified severity, with anxiety: Secondary | ICD-10-CM | POA: Diagnosis not present

## 2021-07-23 DIAGNOSIS — G309 Alzheimer's disease, unspecified: Secondary | ICD-10-CM | POA: Diagnosis not present

## 2021-07-23 DIAGNOSIS — R296 Repeated falls: Secondary | ICD-10-CM | POA: Diagnosis not present

## 2021-07-23 DIAGNOSIS — F0283 Dementia in other diseases classified elsewhere, unspecified severity, with mood disturbance: Secondary | ICD-10-CM | POA: Diagnosis not present

## 2021-07-23 DIAGNOSIS — L0390 Cellulitis, unspecified: Secondary | ICD-10-CM | POA: Diagnosis not present

## 2021-07-23 DIAGNOSIS — R1310 Dysphagia, unspecified: Secondary | ICD-10-CM | POA: Diagnosis not present

## 2021-07-24 DIAGNOSIS — L0390 Cellulitis, unspecified: Secondary | ICD-10-CM | POA: Diagnosis not present

## 2021-07-24 DIAGNOSIS — F0283 Dementia in other diseases classified elsewhere, unspecified severity, with mood disturbance: Secondary | ICD-10-CM | POA: Diagnosis not present

## 2021-07-24 DIAGNOSIS — R1310 Dysphagia, unspecified: Secondary | ICD-10-CM | POA: Diagnosis not present

## 2021-07-24 DIAGNOSIS — R296 Repeated falls: Secondary | ICD-10-CM | POA: Diagnosis not present

## 2021-07-24 DIAGNOSIS — F0284 Dementia in other diseases classified elsewhere, unspecified severity, with anxiety: Secondary | ICD-10-CM | POA: Diagnosis not present

## 2021-07-24 DIAGNOSIS — G309 Alzheimer's disease, unspecified: Secondary | ICD-10-CM | POA: Diagnosis not present

## 2021-07-26 ENCOUNTER — Non-Acute Institutional Stay (SKILLED_NURSING_FACILITY): Payer: Medicare Other | Admitting: Adult Health

## 2021-07-26 DIAGNOSIS — N760 Acute vaginitis: Secondary | ICD-10-CM | POA: Diagnosis not present

## 2021-07-27 ENCOUNTER — Encounter: Payer: Self-pay | Admitting: Adult Health

## 2021-07-27 MED ORDER — METRONIDAZOLE 0.75 % VA GEL: 1.0000 | Freq: Every day | VAGINAL | 0 refills | Status: AC

## 2021-07-27 NOTE — Progress Notes (Signed)
Location:  [facility] of Service:  SNF (31) Provider:   Cindi Carbon, [place] [phone]   Royal Hawthorn, NP  Patient Care Team: Royal Hawthorn, NP as PCP - General (Nurse Practitioner) Irine Seal, MD as Attending Physician (Urology) Jodi Marble, MD as Consulting Physician (Otolaryngology) Martinique, Amy, MD as Consulting Physician (Dermatology) Arta Silence, MD as Consulting Physician (Gastroenterology) Dohmeier, Asencion Partridge, MD as Consulting Physician (Neurology) Geanie Kenning, MD as Consulting Physician (Psychiatry)  Extended Emergency Contact Information Primary Emergency Contact: [name] Address: [address]          [place], [place] [address] [name] of [place] Phone: [phone] Mobile Phone: [phone] Relation: Spouse  Code Status:  DNR Goals of care: Advanced Directive information Advanced Directives 06/12/2021  Does Patient Have a Medical Advance Directive? Yes  Type of Advance Directive Living will;Out of facility DNR (pink MOST or yellow form)  Does patient want to make changes to medical advance directive? No - Patient declined  Copy of [place] in Chart? -  Would patient like information on creating a medical advance directive? -  Pre-existing out of facility DNR order (yellow form or pink MOST form) Yellow form placed in chart (order not valid for inpatient use);Pink MOST form placed in chart (order not valid for inpatient use)     Chief Complaint  Patient presents with   Acute Visit    Vaginal discharge    HPI:  Pt is a 80 y.o. female seen today for an acute visit for vaginal discharge. Resident has advanced dementia and is not able to complete a history. Nurse wrote an Paramedic for green and yellow drainage with a foul odor coming form the vaginal area.    Past Medical History:  Diagnosis Date   Arthritis    back and neck   Carotid artery stenosis     Cerebral amyloid angiopathy (CODE) 06/20/2016   Chronic rhinosinusitis    DDD (degenerative disc disease)    Depression    Diverticulosis of colon    Frequency of urination    Glaucoma    Headache disorder 12/13/2014   Right temple   Hearing loss    History of kidney stones    MULTIBLE SURGICAL INTERVENTIONS   History of skin cancer HX TOPICAL FACIAL [place] 2013   History of TIA (transient ischemic attack)    NOV 2013-- NO RESIDUAL   Hypertension    IBS (irritable bowel syndrome)    Memory difficulty 05/14/2016   Memory loss    Mild asthma    COLD INDUCED   Right ureteral stone    Seasonal and perennial allergic rhinitis    Shingles    EYEBALL 09-17-2013   Spondylosis    Past Surgical History:  Procedure Laterality Date   BUNIONECTOMY     BILATERAL   CALDWELL LUC     sinus drainage procedure   CYSTO/ BILATERAL URETEROSCOPIC STONE EXTRACTIONS  02-09-2003   CYSTO/ BILATERAL URETEROSCOPY/  LASER OF STONES LEFT SIDE  06-02-2007   CYSTOSCOPY/RETROGRADE/URETEROSCOPY/STONE EXTRACTION WITH BASKET  03/31/2012   Procedure: CYSTOSCOPY/RETROGRADE/URETEROSCOPY/STONE EXTRACTION WITH BASKET;  Surgeon: Malka So, MD;  Location: [facility];  Service: Urology;  Laterality: Right;    CYSTOSCOPY/RETROGRADE/URETEROSCOPY/STONE EXTRACTION WITH BASKET Right 03/29/2013   Procedure: CYSTOSCOPY//URETEROSCOPY/STONE EXTRACTION WITH BASKET;  Surgeon: Malka So, MD;  [facility];  Service: Urology;  Laterality: Right;   CYSTOSCOPY/RETROGRADE/URETEROSCOPY/STONE EXTRACTION WITH BASKET Right 10/14/2013   Procedure:  CYSTOSCOPY/RETROGRADE/URETEROSCOPY/STONE EXTRACTION WITH BASKET;  Surgeon: Irine Seal, MD;  Location: [facility];  Service: Urology;  Laterality: Right;   ESOPHAGOGASTRODUODENOSCOPY (EGD) WITH PROPOFOL N/A 04/12/2015   Procedure: ESOPHAGOGASTRODUODENOSCOPY (EGD) WITH PROPOFOL;  Surgeon: Arta Silence, MD;  [facility];  Service: Endoscopy;   Laterality: N/A;   EUS N/A 04/12/2015   Procedure: ESOPHAGEAL ENDOSCOPIC ULTRASOUND (EUS) RADIAL;  Surgeon: Arta Silence, MD;  [facility];  Service: Endoscopy;  Laterality: N/A;   EXTRACORPOREAL SHOCK WAVE LITHOTRIPSY  about 1093/ Dr Reece Agar   Never completed procedure-could not tolerate pain. Had seizures due to pain   EYE SURGERY  AGE 81   INJURY   HERNIA REPAIR  AGE 20   HOLMIUM LASER APPLICATION Right 2/35/5732   Procedure: HOLMIUM LASER APPLICATION;  Surgeon: Irine Seal, MD;  Location: [facility];  Service: Urology;  Laterality: Right;   LAPAROSCOPIC CHOLECYSTECTOMY  01-12-2005   LEFT URETEROSCOPIC STONE EXTRACTION    LAST ONE 04-28-2009   SEVERAL SURGICAL STONE EXTRACTIONS   LUMBAR LAMINECTOMY  09-30-2002   L4 - 5;  SPONDYLOLISTHISES W/ STENOSIS AND RADICULOPATHY   PERCUTANEOUS NEPHROSTOLITHOTOMY  1975   RIGHT URETEROSCOPIC STONE EXTRACTION  LAST ONE 02-21-2011   MULTIPLE SURGICAL STONE EXTRACTIONS   TONSILECTOMY, ADENOIDECTOMY, BILATERAL MYRINGOTOMY AND TUBES  CHILD   VAGINAL HYSTERECTOMY  1991   W/ BILATERAL SALPINGOOPHECTOMY    Allergies  Allergen Reactions   Donepezil Hcl    Ace Inhibitors Cough   Hctz [Hydrochlorothiazide]     Bladder problems   Triptans     Avoid per neuro    Outpatient Encounter Medications as of 07/26/2021  Medication Sig   metroNIDAZOLE (METROGEL VAGINAL) 0.75 % vaginal gel Place 1 Applicatorful vaginally at bedtime for 5 days.   DULoxetine (CYMBALTA) 30 MG capsule Take 30 mg by mouth daily.   LORazepam (ATIVAN) 0.5 MG tablet Take 0.5 mg by mouth every 6 (six) hours as needed for anxiety.   OLANZapine (ZYPREXA) 2.5 MG tablet Take 2.5 mg by mouth at bedtime.   polyethylene glycol (MIRALAX / GLYCOLAX) 17 g packet Take 17 g by mouth daily. With 8 oz of fluid   sennosides-docusate sodium (SENOKOT-S) 8.6-50 MG tablet Take 2 tablets by mouth at bedtime.   triamcinolone cream (KENALOG) 0.1 % Apply 1 application topically  4 (four) times daily. Apply to chest and upper legs x 14 days   No facility-administered encounter medications on file as of 07/26/2021.    Review of Systems  Unable to perform ROS: Dementia   Immunization History  Administered Date(s) Administered   Hepatitis A 11/15/2003, 06/11/2004, 12/25/2012   Hepatitis B 11/14/2008, 12/15/2008   Influenza Split 06/17/2011   Influenza Whole 06/16/2010   Influenza, High Dose Seasonal PF 05/27/2016, 07/15/2019   Influenza,inj,Quad PF,6+ Mos 07/07/2018   Influenza-Unspecified 07/10/2017   Moderna SARS-COV2 Booster Vaccination 07/26/2020, 05/08/2021   Moderna Sars-Covid-2 Vaccination 09/21/2019, 10/19/2019   Pneumococcal Conjugate-13 08/09/2014   Pneumococcal Polysaccharide-23 04/22/2011, 11/15/2011   Tdap 11/14/2008, 12/25/2012   Typhoid Inactivated 12/25/2012   Yellow Fever 12/25/2012   Zoster Recombinat (Shingrix) 01/03/2017, 04/04/2017   Zoster, Live 05/07/2012   Pertinent  Health Maintenance Due  Topic Date Due   INFLUENZA VACCINE  04/16/2021   Fall Risk 10/29/2016 01/21/2018 11/17/2018  Falls in the past year? Yes Yes 1  Was there an injury with Fall? Yes No 0  Fall Risk Category Calculator - - 2  Fall Risk Category - - Moderate  Patient Fall Risk Level - -  Moderate fall risk  Patient at Risk for Falls Due to - - History of fall(s);Impaired mobility;Medication side effect;Impaired balance/gait;Mental status change  Fall risk Follow up Falls prevention discussed - Falls evaluation completed;Education provided;Falls prevention discussed  Fall risk Follow up - - with staff at [facility]:    There were no vitals filed for this visit. There is no height or weight on file to calculate BMI. Physical Exam Vitals and nursing note reviewed. Exam conducted with a chaperone present.  Constitutional:      Appearance: Normal appearance.  Genitourinary:    Pubic Area: No rash.      Labia:        Right: No rash or  tenderness.        Left: No rash or tenderness.      Vagina: Vaginal discharge present.    Neurological:     Mental Status: She is alert.    Labs reviewed: Recent Labs    03/12/21 0000  NA 142  K 4.7  CL 104  CO2 25*  BUN 11  CREATININE 0.8  CALCIUM 9.6   No results for input(s): AST, ALT, ALKPHOS, BILITOT, PROT, ALBUMIN in the last 8760 hours. Recent Labs    03/12/21 0000  WBC 7.0  HGB 15.2  HCT 44  PLT 206   Lab Results  Component Value Date   TSH 1.93 02/01/2019   Lab Results  Component Value Date   HGBA1C 5.3 02/21/2020   Lab Results  Component Value Date   CHOL 183 11/14/2017   HDL 64 11/14/2017   LDLCALC 102 11/14/2017   TRIG 86 11/14/2017   CHOLHDL 2.9 07/15/2016    Significant Diagnostic Results in last 30 days:  No results found.  Assessment/Plan 1. Acute vaginitis Goals of care are comfort based with no treatment to prolong life but her POA is ok with treatment that will improve any symptoms that are uncomfortable.  - metroNIDAZOLE (METROGEL VAGINAL) 0.75 % vaginal gel; Place 1 Applicatorful vaginally at bedtime for 5 days.  Dispense: 70 g; Refill: 0    Family/ staff Communication: nurse  Labs/tests ordered:  NA

## 2021-07-30 DIAGNOSIS — F0284 Dementia in other diseases classified elsewhere, unspecified severity, with anxiety: Secondary | ICD-10-CM | POA: Diagnosis not present

## 2021-07-30 DIAGNOSIS — R296 Repeated falls: Secondary | ICD-10-CM | POA: Diagnosis not present

## 2021-07-30 DIAGNOSIS — R1310 Dysphagia, unspecified: Secondary | ICD-10-CM | POA: Diagnosis not present

## 2021-07-30 DIAGNOSIS — L0390 Cellulitis, unspecified: Secondary | ICD-10-CM | POA: Diagnosis not present

## 2021-07-30 DIAGNOSIS — F0283 Dementia in other diseases classified elsewhere, unspecified severity, with mood disturbance: Secondary | ICD-10-CM | POA: Diagnosis not present

## 2021-07-30 DIAGNOSIS — G309 Alzheimer's disease, unspecified: Secondary | ICD-10-CM | POA: Diagnosis not present

## 2021-08-01 DIAGNOSIS — R296 Repeated falls: Secondary | ICD-10-CM | POA: Diagnosis not present

## 2021-08-01 DIAGNOSIS — F0283 Dementia in other diseases classified elsewhere, unspecified severity, with mood disturbance: Secondary | ICD-10-CM | POA: Diagnosis not present

## 2021-08-01 DIAGNOSIS — F0284 Dementia in other diseases classified elsewhere, unspecified severity, with anxiety: Secondary | ICD-10-CM | POA: Diagnosis not present

## 2021-08-01 DIAGNOSIS — R1310 Dysphagia, unspecified: Secondary | ICD-10-CM | POA: Diagnosis not present

## 2021-08-01 DIAGNOSIS — G309 Alzheimer's disease, unspecified: Secondary | ICD-10-CM | POA: Diagnosis not present

## 2021-08-01 DIAGNOSIS — L0390 Cellulitis, unspecified: Secondary | ICD-10-CM | POA: Diagnosis not present

## 2021-08-02 ENCOUNTER — Non-Acute Institutional Stay (SKILLED_NURSING_FACILITY): Payer: Medicare Other | Admitting: Adult Health

## 2021-08-02 DIAGNOSIS — F03918 Unspecified dementia, unspecified severity, with other behavioral disturbance: Secondary | ICD-10-CM | POA: Diagnosis not present

## 2021-08-02 DIAGNOSIS — R1312 Dysphagia, oropharyngeal phase: Secondary | ICD-10-CM

## 2021-08-02 DIAGNOSIS — I10 Essential (primary) hypertension: Secondary | ICD-10-CM | POA: Diagnosis not present

## 2021-08-02 DIAGNOSIS — I680 Cerebral amyloid angiopathy: Secondary | ICD-10-CM | POA: Diagnosis not present

## 2021-08-02 DIAGNOSIS — G459 Transient cerebral ischemic attack, unspecified: Secondary | ICD-10-CM

## 2021-08-03 ENCOUNTER — Encounter: Payer: Self-pay | Admitting: Adult Health

## 2021-08-03 NOTE — Progress Notes (Signed)
Location:  [facility] of Service:  SNF (31) Provider:   Cindi Carbon, Odem [phone]   Royal Hawthorn, NP  Patient Care Team: Royal Hawthorn, NP as PCP - General (Nurse Practitioner) Irine Seal, MD as Attending Physician (Urology) Jodi Marble, MD as Consulting Physician (Otolaryngology) Martinique, Amy, MD as Consulting Physician (Dermatology) Arta Silence, MD as Consulting Physician (Gastroenterology) Dohmeier, Asencion Partridge, MD as Consulting Physician (Neurology) Geanie Kenning, MD as Consulting Physician (Psychiatry)  Extended Emergency Contact Information Primary Emergency Contact: [name] Address: [address]          [place], [place] [address] [name] of [place] Phone: [phone] Mobile Phone: [phone] Relation: Spouse  Code Status:  DNR Goals of care: Advanced Directive information Advanced Directives 06/12/2021  Does Patient Have a Medical Advance Directive? Yes  Type of Advance Directive Living will;Out of facility DNR (pink MOST or yellow form)  Does patient want to make changes to medical advance directive? No - Patient declined  Copy of [place] in Chart? -  Would patient like information on creating a medical advance directive? -  Pre-existing out of facility DNR order (yellow form or pink MOST form) Yellow form placed in chart (order not valid for inpatient use);Pink MOST form placed in chart (order not valid for inpatient use)     Chief Complaint  Patient presents with   Medical Management of Chronic Issues    HPI:  Pt is a 80 y.o. female seen today for medical management of chronic diseases.   PMH significant for cerebral amyloid angiopathy with dementia, carotid artery stenosis, renal stones, UTI, PBA, DDD, TIA, HTN, IBS, and constipation. She is followed by hospice for dementia.   Treated for vaginal discharge with metro gel one week ago. No further  issues reported.   Continues on a D1 diet with no coughing or choking.   Needs assistance with a ADLs hoyer lift, incontinent.   Weight fairly stable.  No issues with behaviors. Back on zyprexa for restlessness. Had a series of falls which is why it was restarted. Wt Readings from Last 3 Encounters:  08/03/21 143 lb (64.9 kg)  07/02/21 141 lb 6.4 oz (64.1 kg)  06/12/21 145 lb (65.8 kg)     Past Medical History:  Diagnosis Date   Arthritis    back and neck   Carotid artery stenosis    Cerebral amyloid angiopathy (CODE) 06/20/2016   Chronic rhinosinusitis    DDD (degenerative disc disease)    Depression    Diverticulosis of colon    Frequency of urination    Glaucoma    Headache disorder 12/13/2014   Right temple   Hearing loss    History of kidney stones    MULTIBLE SURGICAL INTERVENTIONS   History of skin cancer HX TOPICAL FACIAL [place] 2013   History of TIA (transient ischemic attack)    NOV 2013-- NO RESIDUAL   Hypertension    IBS (irritable bowel syndrome)    Memory difficulty 05/14/2016   Memory loss    Mild asthma    COLD INDUCED   Right ureteral stone    Seasonal and perennial allergic rhinitis    Shingles    EYEBALL 09-17-2013   Spondylosis    Past Surgical History:  Procedure Laterality Date   BUNIONECTOMY     BILATERAL   CALDWELL LUC     sinus drainage procedure   CYSTO/ BILATERAL URETEROSCOPIC STONE EXTRACTIONS  02-09-2003  CYSTO/ BILATERAL URETEROSCOPY/  LASER OF STONES LEFT SIDE  06-02-2007   CYSTOSCOPY/RETROGRADE/URETEROSCOPY/STONE EXTRACTION WITH BASKET  03/31/2012   Procedure: CYSTOSCOPY/RETROGRADE/URETEROSCOPY/STONE EXTRACTION WITH BASKET;  Surgeon: Malka So, MD;  Location[facility];  Service: Urology;  Laterality: Right;    CYSTOSCOPY/RETROGRADE/URETEROSCOPY/STONE EXTRACTION WITH BASKET Right 03/29/2013   Procedure: CYSTOSCOPY//URETEROSCOPY/STONE EXTRACTION WITH BASKET;  Surgeon: Malka So, MD;  Location: [facility];   Service: Urology;  Laterality: Right;   CYSTOSCOPY/RETROGRADE/URETEROSCOPY/STONE EXTRACTION WITH BASKET Right 10/14/2013   Procedure: CYSTOSCOPY/RETROGRADE/URETEROSCOPY/STONE EXTRACTION WITH BASKET;  Surgeon: Irine Seal, MD;  Location: [facility];  Service: Urology;  Laterality: Right;   ESOPHAGOGASTRODUODENOSCOPY (EGD) WITH PROPOFOL N/A 04/12/2015   Procedure: ESOPHAGOGASTRODUODENOSCOPY (EGD) WITH PROPOFOL;  Surgeon: Arta Silence, MD;  Location: [facility];  Service: Endoscopy;  Laterality: N/A;   EUS N/A 04/12/2015   Procedure: ESOPHAGEAL ENDOSCOPIC ULTRASOUND (EUS) RADIAL;  Surgeon: Arta Silence, MD;  Location: [facility];  Service: Endoscopy;  Laterality: N/A;   EXTRACORPOREAL SHOCK WAVE LITHOTRIPSY  about 0102/ Dr Reece Agar   Never completed procedure-could not tolerate pain. Had seizures due to pain   EYE SURGERY  AGE 80   INJURY   HERNIA REPAIR  AGE 48   HOLMIUM LASER APPLICATION Right 04/10/3663   Procedure: HOLMIUM LASER APPLICATION;  Surgeon: Irine Seal, MD;  Location: [facility];  Service: Urology;  Laterality: Right;   LAPAROSCOPIC CHOLECYSTECTOMY  01-12-2005   LEFT URETEROSCOPIC STONE EXTRACTION    LAST ONE 04-28-2009   SEVERAL SURGICAL STONE EXTRACTIONS   LUMBAR LAMINECTOMY  09-30-2002   L4 - 5;  SPONDYLOLISTHISES W/ STENOSIS AND RADICULOPATHY   PERCUTANEOUS NEPHROSTOLITHOTOMY  1975   RIGHT URETEROSCOPIC STONE EXTRACTION  LAST ONE 02-21-2011   MULTIPLE SURGICAL STONE EXTRACTIONS   TONSILECTOMY, ADENOIDECTOMY, BILATERAL MYRINGOTOMY AND TUBES  CHILD   VAGINAL HYSTERECTOMY  1991   W/ BILATERAL SALPINGOOPHECTOMY    Allergies  Allergen Reactions   Donepezil Hcl    Ace Inhibitors Cough   Hctz [Hydrochlorothiazide]     Bladder problems   Triptans     Avoid per neuro    Outpatient Encounter Medications as of 08/02/2021  Medication Sig   DULoxetine (CYMBALTA) 30 MG capsule Take 30 mg by mouth daily.   LORazepam (ATIVAN) 0.5 MG tablet  Take 0.5 mg by mouth every 6 (six) hours as needed for anxiety.   OLANZapine (ZYPREXA) 2.5 MG tablet Take 2.5 mg by mouth at bedtime.   polyethylene glycol (MIRALAX / GLYCOLAX) 17 g packet Take 17 g by mouth daily. With 8 oz of fluid   sennosides-docusate sodium (SENOKOT-S) 8.6-50 MG tablet Take 2 tablets by mouth at bedtime.   triamcinolone cream (KENALOG) 0.1 % Apply 1 application topically 4 (four) times daily. Apply to chest and upper legs x 14 days   No facility-administered encounter medications on file as of 08/02/2021.    Review of Systems  Unable to perform ROS: Dementia   Immunization History  Administered Date(s) Administered   Hepatitis A 11/15/2003, 06/11/2004, 12/25/2012   Hepatitis B 11/14/2008, 12/15/2008   Influenza Split 06/17/2011   Influenza Whole 06/16/2010   Influenza, High Dose Seasonal PF 05/27/2016, 07/15/2019   Influenza,inj,Quad PF,6+ Mos 07/07/2018   Influenza-Unspecified 07/10/2017   Moderna SARS-COV2 Booster Vaccination 07/26/2020, 05/08/2021   Moderna Sars-Covid-2 Vaccination 09/21/2019, 10/19/2019   Pneumococcal Conjugate-13 08/09/2014   Pneumococcal Polysaccharide-23 04/22/2011, 11/15/2011   Tdap 11/14/2008, 12/25/2012   Typhoid Inactivated 12/25/2012   Yellow Fever 12/25/2012   Zoster Recombinat (Shingrix) 01/03/2017, 04/04/2017  Zoster, Live 05/07/2012   Pertinent  Health Maintenance Due  Topic Date Due   INFLUENZA VACCINE  04/16/2021   Fall Risk  11/17/2018 01/21/2018 10/29/2016  Falls in the past year? 1 Yes Yes  Number falls in past yr: 1 2 or more 2 or more  Injury with Fall? 0 No Yes  Risk for fall due to : History of fall(s);Impaired mobility;Medication side effect;Impaired balance/gait;Mental status change - -  Follow up Falls evaluation completed;Education provided;Falls prevention discussed - Falls prevention discussed  Comment with staff at [facility]:    Vitals:   08/03/21 0950  Weight: 143 lb  (64.9 kg)   Body mass index is 26.16 kg/m. Physical Exam Vitals and nursing note reviewed.  Constitutional:      General: She is not in acute distress.    Appearance: She is not diaphoretic.  HENT:     Head: Normocephalic and atraumatic.     Nose: Nose normal. No congestion.     Mouth/Throat:     Comments: Refused oral exam  Neck:     Vascular: No JVD.  Cardiovascular:     Rate and Rhythm: Normal rate and regular rhythm.     Heart sounds: No murmur heard. Pulmonary:     Effort: Pulmonary effort is normal. No respiratory distress.     Breath sounds: Normal breath sounds. No wheezing.  Abdominal:     General: Bowel sounds are normal. There is no distension.     Palpations: Abdomen is soft.     Tenderness: There is no abdominal tenderness.  Musculoskeletal:     Right lower leg: No edema.     Left lower leg: No edema.  Skin:    General: Skin is warm and dry.  Neurological:     General: No focal deficit present.     Mental Status: She is alert. Mental status is at baseline.  Psychiatric:     Comments: Flat stares in to space    Labs reviewed: Recent Labs    03/12/21 0000  NA 142  K 4.7  CL 104  CO2 25*  BUN 11  CREATININE 0.8  CALCIUM 9.6    No results for input(s): AST, ALT, ALKPHOS, BILITOT, PROT, ALBUMIN in the last 8760 hours. Recent Labs    03/12/21 0000  WBC 7.0  HGB 15.2  HCT 44  PLT 206    Lab Results  Component Value Date   TSH 1.93 02/01/2019   Lab Results  Component Value Date   HGBA1C 5.3 02/21/2020   Lab Results  Component Value Date   CHOL 183 11/14/2017   HDL 64 11/14/2017   LDLCALC 102 11/14/2017   TRIG 86 11/14/2017   CHOLHDL 2.9 07/15/2016    Significant Diagnostic Results in last 30 days:  No results found.  Assessment/Plan  1. Cerebral amyloid angiopathy (CODE) Noted on MRI In 2017 Multiple punctate foci susceptibility artifacts scattered throughout both cerebral hemispheres, consistent with chronic small micro  hemorrhages, and compatible with known history of cerebral amyloid angiopathy.Moderate chronic microvascular ischemic disease  2. Chronic dementia, with behavioral disturbance Severe stage now on hospice Continue zyprexa at bedtime and as needed ativan  DNR Most form states no hospitalizations, no antibiotics unless for comfort  3. Oropharyngeal dysphagia Continue D1 diet  Asp prec 1:1 sypervision  4. TIA (transient ischemic attack) No new events, was on asa now off due to goals of care.   5. Essential hypertension Not currently on  meds, which were discontinued due to lack of benefit.  SBP 140-150's  Avoid aggressive treatment due to advanced dementia and goals of care.   Family/ staff Communication: nurse  Labs/tests ordered:  NA

## 2021-08-07 DIAGNOSIS — L0390 Cellulitis, unspecified: Secondary | ICD-10-CM | POA: Diagnosis not present

## 2021-08-07 DIAGNOSIS — G309 Alzheimer's disease, unspecified: Secondary | ICD-10-CM | POA: Diagnosis not present

## 2021-08-07 DIAGNOSIS — R1310 Dysphagia, unspecified: Secondary | ICD-10-CM | POA: Diagnosis not present

## 2021-08-07 DIAGNOSIS — F0284 Dementia in other diseases classified elsewhere, unspecified severity, with anxiety: Secondary | ICD-10-CM | POA: Diagnosis not present

## 2021-08-07 DIAGNOSIS — R296 Repeated falls: Secondary | ICD-10-CM | POA: Diagnosis not present

## 2021-08-07 DIAGNOSIS — F0283 Dementia in other diseases classified elsewhere, unspecified severity, with mood disturbance: Secondary | ICD-10-CM | POA: Diagnosis not present

## 2021-08-14 DIAGNOSIS — R1310 Dysphagia, unspecified: Secondary | ICD-10-CM | POA: Diagnosis not present

## 2021-08-14 DIAGNOSIS — G309 Alzheimer's disease, unspecified: Secondary | ICD-10-CM | POA: Diagnosis not present

## 2021-08-14 DIAGNOSIS — R296 Repeated falls: Secondary | ICD-10-CM | POA: Diagnosis not present

## 2021-08-14 DIAGNOSIS — F0283 Dementia in other diseases classified elsewhere, unspecified severity, with mood disturbance: Secondary | ICD-10-CM | POA: Diagnosis not present

## 2021-08-14 DIAGNOSIS — F0284 Dementia in other diseases classified elsewhere, unspecified severity, with anxiety: Secondary | ICD-10-CM | POA: Diagnosis not present

## 2021-08-14 DIAGNOSIS — L0390 Cellulitis, unspecified: Secondary | ICD-10-CM | POA: Diagnosis not present

## 2021-08-20 ENCOUNTER — Non-Acute Institutional Stay (SKILLED_NURSING_FACILITY): Payer: Medicare Other | Admitting: Internal Medicine

## 2021-08-20 ENCOUNTER — Encounter: Payer: Self-pay | Admitting: Internal Medicine

## 2021-08-20 DIAGNOSIS — F03918 Unspecified dementia, unspecified severity, with other behavioral disturbance: Secondary | ICD-10-CM

## 2021-08-20 DIAGNOSIS — R1312 Dysphagia, oropharyngeal phase: Secondary | ICD-10-CM

## 2021-08-20 DIAGNOSIS — I680 Cerebral amyloid angiopathy: Secondary | ICD-10-CM | POA: Diagnosis not present

## 2021-08-20 NOTE — Progress Notes (Signed)
Location:   [place] Room Number: 125 Place of Service:  SNF ([place] Provider:  Veleta Miners, MD  Royal Hawthorn, NP  Patient Care Team: Royal Hawthorn, NP as PCP - General (Nurse Practitioner) Irine Seal, MD as Attending Physician (Urology) Jodi Marble, MD as Consulting Physician (Otolaryngology) Martinique, Amy, MD as Consulting Physician (Dermatology) Arta Silence, MD as Consulting Physician (Gastroenterology) Dohmeier, Asencion Partridge, MD as Consulting Physician (Neurology) Geanie Kenning, MD as Consulting Physician (Psychiatry)  Extended Emergency Contact Information Primary Emergency Contact: [name] Address: [address]          [place], [place] [address] [name] of [place] Phone: [phone] Mobile Phone: [phone] Relation: Spouse  Code Status:  DNR Goals of care: Advanced Directive information Advanced Directives 08/20/2021  Does Patient Have a Medical Advance Directive? Yes  Type of Advance Directive Living will;Out of facility DNR (pink MOST or yellow form)  Does patient want to make changes to medical advance directive? No - Patient declined  Copy of [place] in Chart? -  Would patient like information on creating a medical advance directive? -  Pre-existing out of facility DNR order (yellow form or pink MOST form) Yellow form placed in chart (order not valid for inpatient use)     Chief Complaint  Patient presents with   Medical Management of Chronic Issues    Routine follow up visit   Addison booster    HPI:  Pt is a 80 y.o. female seen today for medical management of chronic diseases.    Patient has h/o Cerebral Amyloid Angiopathy with now End stage dementia and Depression with Dysphagia     She is stable. No new Nursing issues. No Behavior issues Her weight is stable Hoyer lift Care Needs help with Feeding No Falls Wt  Readings from Last 3 Encounters:  08/20/21 143 lb (64.9 kg)  08/03/21 143 lb (64.9 kg)  07/02/21 141 lb 6.4 oz (64.1 kg)   Past Medical History:  Diagnosis Date   Arthritis    back and neck   Carotid artery stenosis    Cerebral amyloid angiopathy (CODE) 06/20/2016   Chronic rhinosinusitis    DDD (degenerative disc disease)    Depression    Diverticulosis of colon    Frequency of urination    Glaucoma    Headache disorder 12/13/2014   Right temple   Hearing loss    History of kidney stones    MULTIBLE SURGICAL INTERVENTIONS   History of skin cancer HX TOPICAL FACIAL [place] 2013   History of TIA (transient ischemic attack)    NOV 2013-- NO RESIDUAL   Hypertension    IBS (irritable bowel syndrome)    Memory difficulty 05/14/2016   Memory loss    Mild asthma    COLD INDUCED   Right ureteral stone    Seasonal and perennial allergic rhinitis    Shingles    EYEBALL 09-17-2013   Spondylosis    Past Surgical History:  Procedure Laterality Date   BUNIONECTOMY     BILATERAL   CALDWELL LUC     sinus drainage procedure   CYSTO/ BILATERAL URETEROSCOPIC STONE EXTRACTIONS  02-09-2003   CYSTO/ BILATERAL URETEROSCOPY/  LASER OF STONES LEFT SIDE  06-02-2007   CYSTOSCOPY/RETROGRADE/URETEROSCOPY/STONE EXTRACTION WITH BASKET  03/31/2012   Procedure: CYSTOSCOPY/RETROGRADE/URETEROSCOPY/STONE EXTRACTION WITH BASKET;  Surgeon: Malka So, MD;  [facility];  Service: Urology;  Laterality: Right;  CYSTOSCOPY/RETROGRADE/URETEROSCOPY/STONE EXTRACTION WITH BASKET Right 03/29/2013   Procedure: CYSTOSCOPY//URETEROSCOPY/STONE EXTRACTION WITH BASKET;  Surgeon: Malka So, MD;  Location: [facility];  Service: Urology;  Laterality: Right;   CYSTOSCOPY/RETROGRADE/URETEROSCOPY/STONE EXTRACTION WITH BASKET Right 10/14/2013   Procedure: CYSTOSCOPY/RETROGRADE/URETEROSCOPY/STONE EXTRACTION WITH BASKET;  Surgeon: Irine Seal, MD;  Location: [facility];  Service: Urology;   Laterality: Right;   ESOPHAGOGASTRODUODENOSCOPY (EGD) WITH PROPOFOL N/A 04/12/2015   Procedure: ESOPHAGOGASTRODUODENOSCOPY (EGD) WITH PROPOFOL;  Surgeon: Arta Silence, MD;  [facility];  Service: Endoscopy;  Laterality: N/A;   EUS N/A 04/12/2015   Procedure: ESOPHAGEAL ENDOSCOPIC ULTRASOUND (EUS) RADIAL;  Surgeon: Arta Silence, MD;  Location: [facility];  Service: Endoscopy;  Laterality: N/A;   EXTRACORPOREAL SHOCK WAVE LITHOTRIPSY  about 4098/ Dr Reece Agar   Never completed procedure-could not tolerate pain. Had seizures due to pain   EYE SURGERY  AGE 81   INJURY   HERNIA REPAIR  AGE 83   HOLMIUM LASER APPLICATION Right 10/04/1476   Procedure: HOLMIUM LASER APPLICATION;  Surgeon: Irine Seal, MD;  Location: [facility];  Service: Urology;  Laterality: Right;   LAPAROSCOPIC CHOLECYSTECTOMY  01-12-2005   LEFT URETEROSCOPIC STONE EXTRACTION    LAST ONE 04-28-2009   SEVERAL SURGICAL STONE EXTRACTIONS   LUMBAR LAMINECTOMY  09-30-2002   L4 - 5;  SPONDYLOLISTHISES W/ STENOSIS AND RADICULOPATHY   PERCUTANEOUS NEPHROSTOLITHOTOMY  1975   RIGHT URETEROSCOPIC STONE EXTRACTION  LAST ONE 02-21-2011   MULTIPLE SURGICAL STONE EXTRACTIONS   TONSILECTOMY, ADENOIDECTOMY, BILATERAL MYRINGOTOMY AND TUBES  CHILD   VAGINAL HYSTERECTOMY  1991   W/ BILATERAL SALPINGOOPHECTOMY    Allergies  Allergen Reactions   Donepezil Hcl    Ace Inhibitors Cough   Hctz [Hydrochlorothiazide]     Bladder problems   Triptans     Avoid per neuro    Allergies as of 08/20/2021       Reactions   Donepezil Hcl    Ace Inhibitors Cough   Hctz [hydrochlorothiazide]    Bladder problems   Triptans    Avoid per neuro        Medication List        Accurate as of August 20, 2021  1:37 PM. If you have any questions, ask your nurse or doctor.          DULoxetine 30 MG capsule Commonly known as: CYMBALTA Take 30 mg by mouth daily.   LORazepam 0.5 MG tablet Commonly known as:  ATIVAN Take 0.5 mg by mouth every 6 (six) hours as needed for anxiety.   OLANZapine 2.5 MG tablet Commonly known as: ZYPREXA Take 2.5 mg by mouth at bedtime.   polyethylene glycol 17 g packet Commonly known as: MIRALAX / GLYCOLAX Take 17 g by mouth daily. With 8 oz of fluid   sennosides-docusate sodium 8.6-50 MG tablet Commonly known as: SENOKOT-S Take 2 tablets by mouth in the morning and at bedtime.   triamcinolone cream 0.1 % Commonly known as: KENALOG Apply 1 application topically 4 (four) times daily. Apply to chest and upper legs x 14 days        Review of Systems  Unable to perform ROS: Dementia   Immunization History  Administered Date(s) Administered   Hepatitis A 11/15/2003, 06/11/2004, 12/25/2012   Hepatitis B 11/14/2008, 12/15/2008   Influenza Split 06/17/2011   Influenza Whole 06/16/2010   Influenza, High Dose Seasonal PF 05/27/2016, 07/15/2019   Influenza,inj,Quad PF,6+ Mos 07/07/2018   Influenza-Unspecified 07/10/2017, 06/20/2021   Moderna Covid-19 Vaccine Bivalent Booster  92yrs & up 06/27/2021   Moderna SARS-COV2 Booster Vaccination 07/26/2020, 05/08/2021   Moderna Sars-Covid-2 Vaccination 09/21/2019, 10/19/2019, 07/26/2020, 06/27/2021   Pneumococcal Conjugate-13 08/09/2014   Pneumococcal Polysaccharide-23 04/22/2011, 11/15/2011   Tdap 11/14/2008, 12/25/2012   Typhoid Inactivated 12/25/2012   Yellow Fever 12/25/2012   Zoster Recombinat (Shingrix) 01/03/2017, 04/04/2017   Zoster, Live 05/07/2012   Pertinent  Health Maintenance Due  Topic Date Due   INFLUENZA VACCINE  Completed   Fall Risk 10/29/2016 01/21/2018 11/17/2018  Falls in the past year? Yes Yes 1  Was there an injury with Fall? Yes No 0  Fall Risk Category Calculator - - 2  Fall Risk Category - - Moderate  Patient Fall Risk Level - - Moderate fall risk  Patient at Risk for Falls Due to - - History of fall(s);Impaired mobility;Medication side effect;Impaired balance/gait;Mental status change   Fall risk Follow up Falls prevention discussed - Falls evaluation completed;Education provided;Falls prevention discussed  Fall risk Follow up - - with staff at [facility]:    Vitals:   08/20/21 1146  BP: 99/63  Pulse: 83  Resp: 16  Temp: 97.6 F (36.4 C)  SpO2: 97%  Weight: 143 lb (64.9 kg)  Height: 5\' 2"  (1.575 m)   Body mass index is 26.16 kg/m. Physical Exam Vitals reviewed.  Constitutional:      Appearance: Normal appearance.  HENT:     Head: Normocephalic.     Nose: Nose normal.     Mouth/Throat:     Mouth: Mucous membranes are moist.     Pharynx: Oropharynx is clear.  Eyes:     Pupils: Pupils are equal, round, and reactive to light.  Cardiovascular:     Rate and Rhythm: Normal rate and regular rhythm.     Pulses: Normal pulses.     Heart sounds: Normal heart sounds. No murmur heard. Pulmonary:     Effort: Pulmonary effort is normal.     Breath sounds: Normal breath sounds.  Abdominal:     General: Abdomen is flat. Bowel sounds are normal.     Palpations: Abdomen is soft.  Musculoskeletal:        General: No swelling.     Cervical back: Neck supple.  Skin:    General: Skin is warm.  Neurological:     General: No focal deficit present.     Mental Status: She is alert.     Comments: Does not respond or Follow any commands  Psychiatric:        Mood and Affect: Mood normal.        Thought Content: Thought content normal.    Labs reviewed: Recent Labs    03/12/21 0000  NA 142  K 4.7  CL 104  CO2 25*  BUN 11  CREATININE 0.8  CALCIUM 9.6   No results for input(s): AST, ALT, ALKPHOS, BILITOT, PROT, ALBUMIN in the last 8760 hours. Recent Labs    03/12/21 0000  WBC 7.0  HGB 15.2  HCT 44  PLT 206   Lab Results  Component Value Date   TSH 1.93 02/01/2019   Lab Results  Component Value Date   HGBA1C 5.3 02/21/2020   Lab Results  Component Value Date   CHOL 183 11/14/2017   HDL 64 11/14/2017   LDLCALC 102  11/14/2017   TRIG 86 11/14/2017   CHOLHDL 2.9 07/15/2016    Significant Diagnostic Results in last 30 days:  No results found.  Assessment/Plan Cerebral amyloid angiopathy (CODE)  Supportive care Chronic dementia, with behavioral disturbance On Zyprexa Failed previous [place] Oropharyngeal dysphagia Weight stable Enrolled [facility] hospice   Family/ staff Communication:   Labs/tests ordered:

## 2021-09-24 ENCOUNTER — Encounter: Payer: Self-pay | Admitting: Adult Health

## 2021-09-24 ENCOUNTER — Non-Acute Institutional Stay (SKILLED_NURSING_FACILITY): Payer: PPO | Admitting: Adult Health

## 2021-09-24 DIAGNOSIS — U071 COVID-19: Secondary | ICD-10-CM

## 2021-09-24 DIAGNOSIS — R0902 Hypoxemia: Secondary | ICD-10-CM | POA: Diagnosis not present

## 2021-09-24 DIAGNOSIS — R1312 Dysphagia, oropharyngeal phase: Secondary | ICD-10-CM

## 2021-09-24 NOTE — Progress Notes (Signed)
Location:  [facility] of Service:  SNF (31) Provider:   Cindi Carbon, [place] [phone]   Royal Hawthorn, NP  Patient Care Team: Royal Hawthorn, NP as PCP - General (Nurse Practitioner) Irine Seal, MD as Attending Physician (Urology) Jodi Marble, MD as Consulting Physician (Otolaryngology) Martinique, Amy, MD as Consulting Physician (Dermatology) Arta Silence, MD as Consulting Physician (Gastroenterology) Dohmeier, Asencion Partridge, MD as Consulting Physician (Neurology) Geanie Kenning, MD as Consulting Physician (Psychiatry)  Extended Emergency Contact Information Primary Emergency Contact: [name] Address: [address]          [place], [place] [address] [name] of [place] Phone: [phone] Mobile Phone: [phone] Relation: Spouse  Code Status:  DNR Goals of care: Advanced Directive information Advanced Directives 08/20/2021  Does Patient Have a Medical Advance Directive? Yes  Type of Advance Directive Living will;Out of facility DNR (pink MOST or yellow form)  Does patient want to make changes to medical advance directive? No - Patient declined  Copy of [place] in Chart? -  Would patient like information on creating a medical advance directive? -  Pre-existing out of facility DNR order (yellow form or pink MOST form) Yellow form placed in chart (order not valid for inpatient use)     Chief Complaint  Patient presents with   Acute Visit    Fever     HPI:  Pt is a 82 y.o. female seen today for an acute visit for fever. Resident presented with congested cough and fever of 101.2 on 1/8 in the evening. Resident was placed on isolation. She was noted to be coughing at meals on 1/8 and this morning. No sputum production. Has hx of dementia and dysphagia and is on a puree diet.  Goals of care are comfort based, no antibiotics, and DNR in place. Followed by hospice. Sats 89-90%,  resident placed on 2 liters of oxygen. Sats now 97%. Appears comfortable and no noted sob. Not verbal to ask  further questions. Rapid covid returned positive.    Past Medical History:  Diagnosis Date   Arthritis    back and neck   Carotid artery stenosis    Cerebral amyloid angiopathy (CODE) 06/20/2016   Chronic rhinosinusitis    DDD (degenerative disc disease)    Depression    Diverticulosis of colon    Frequency of urination    Glaucoma    Headache disorder 12/13/2014   Right temple   Hearing loss    History of kidney stones    MULTIBLE SURGICAL INTERVENTIONS   History of skin cancer HX TOPICAL FACIAL [place] 2013   History of TIA (transient ischemic attack)    NOV 2013-- NO RESIDUAL   Hypertension    IBS (irritable bowel syndrome)    Memory difficulty 05/14/2016   Memory loss    Mild asthma    COLD INDUCED   Right ureteral stone    Seasonal and perennial allergic rhinitis    Shingles    EYEBALL 09-17-2013   Spondylosis    Past Surgical History:  Procedure Laterality Date   BUNIONECTOMY     BILATERAL   CALDWELL LUC     sinus drainage procedure   CYSTO/ BILATERAL URETEROSCOPIC STONE EXTRACTIONS  02-09-2003   CYSTO/ BILATERAL URETEROSCOPY/  LASER OF STONES LEFT SIDE  06-02-2007   CYSTOSCOPY/RETROGRADE/URETEROSCOPY/STONE EXTRACTION WITH BASKET  03/31/2012   Procedure: CYSTOSCOPY/RETROGRADE/URETEROSCOPY/STONE EXTRACTION WITH BASKET;  Surgeon: Malka So, MD;  Location: [facility];  Service: Urology;  Laterality: Right;    CYSTOSCOPY/RETROGRADE/URETEROSCOPY/STONE EXTRACTION WITH BASKET Right 03/29/2013   Procedure: CYSTOSCOPY//URETEROSCOPY/STONE EXTRACTION WITH BASKET;  Surgeon: Malka So, MD;  Location: [facility];  Service: Urology;  Laterality: Right;   CYSTOSCOPY/RETROGRADE/URETEROSCOPY/STONE EXTRACTION WITH BASKET Right 10/14/2013   Procedure: CYSTOSCOPY/RETROGRADE/URETEROSCOPY/STONE EXTRACTION WITH BASKET;  Surgeon: Irine Seal, MD;  Location[facility];  Service: Urology;  Laterality: Right;   ESOPHAGOGASTRODUODENOSCOPY (EGD) WITH PROPOFOL N/A 04/12/2015   Procedure: ESOPHAGOGASTRODUODENOSCOPY (EGD) WITH PROPOFOL;  Surgeon: Arta Silence, MD;  Location: [facility];  Service: Endoscopy;  Laterality: N/A;   EUS N/A 04/12/2015   Procedure: ESOPHAGEAL ENDOSCOPIC ULTRASOUND (EUS) RADIAL;  Surgeon: Arta Silence, MD;  [facility];  Service: Endoscopy;  Laterality: N/A;   EXTRACORPOREAL SHOCK WAVE LITHOTRIPSY  about 3419/ Dr Reece Agar   Never completed procedure-could not tolerate pain. Had seizures due to pain   EYE SURGERY  AGE 8   INJURY   HERNIA REPAIR  AGE 71   HOLMIUM LASER APPLICATION Right 3/79/0240   Procedure: HOLMIUM LASER APPLICATION;  Surgeon: Irine Seal, MD;  Location[facility];  Service: Urology;  Laterality: Right;   LAPAROSCOPIC CHOLECYSTECTOMY  01-12-2005   LEFT URETEROSCOPIC STONE EXTRACTION    LAST ONE 04-28-2009   SEVERAL SURGICAL STONE EXTRACTIONS   LUMBAR LAMINECTOMY  09-30-2002   L4 - 5;  SPONDYLOLISTHISES W/ STENOSIS AND RADICULOPATHY   PERCUTANEOUS NEPHROSTOLITHOTOMY  1975   RIGHT URETEROSCOPIC STONE EXTRACTION  LAST ONE 02-21-2011   MULTIPLE SURGICAL STONE EXTRACTIONS   TONSILECTOMY, ADENOIDECTOMY, BILATERAL MYRINGOTOMY AND TUBES  CHILD   VAGINAL HYSTERECTOMY  1991   W/ BILATERAL SALPINGOOPHECTOMY    Allergies  Allergen Reactions   Donepezil Hcl    Ace Inhibitors Cough   Hctz [Hydrochlorothiazide]     Bladder problems   Triptans     Avoid per neuro    Outpatient Encounter Medications as of 09/24/2021  Medication Sig   DULoxetine (CYMBALTA) 30 MG capsule Take 30 mg by mouth daily.   OLANZapine (ZYPREXA) 2.5 MG tablet Take 2.5 mg by mouth at bedtime.   polyethylene glycol (MIRALAX / GLYCOLAX) 17 g packet Take 17 g by mouth daily. With 8 oz of fluid   sennosides-docusate sodium (SENOKOT-S) 8.6-50 MG tablet Take 2 tablets by mouth in the morning and at bedtime.    [DISCONTINUED] LORazepam (ATIVAN) 0.5 MG tablet Take 0.5 mg by mouth every 6 (six) hours as needed for anxiety.   [DISCONTINUED] triamcinolone cream (KENALOG) 0.1 % Apply 1 application topically 4 (four) times daily. Apply to chest and upper legs x 14 days   No facility-administered encounter medications on file as of 09/24/2021.    Review of Systems  Unable to perform ROS: Dementia   Immunization History  Administered Date(s) Administered   Hepatitis A 11/15/2003, 06/11/2004, 12/25/2012   Hepatitis B 11/14/2008, 12/15/2008   Influenza Split 06/17/2011   Influenza Whole 06/16/2010   Influenza, High Dose Seasonal PF 05/27/2016, 07/15/2019   Influenza,inj,Quad PF,6+ Mos 07/07/2018   Influenza-Unspecified 07/10/2017, 06/20/2021   Moderna Covid-19 Vaccine Bivalent Booster 50yrs & up 06/27/2021   Moderna SARS-COV2 Booster Vaccination 07/26/2020, 05/08/2021   Moderna Sars-Covid-2 Vaccination 09/21/2019, 10/19/2019, 07/26/2020, 06/27/2021   Pneumococcal Conjugate-13 08/09/2014   Pneumococcal Polysaccharide-23 04/22/2011, 11/15/2011   Tdap 11/14/2008, 12/25/2012   Typhoid Inactivated 12/25/2012   Yellow Fever 12/25/2012   Zoster Recombinat (Shingrix) 01/03/2017, 04/04/2017   Zoster, Live 05/07/2012   Pertinent  Health Maintenance Due  Topic Date Due  INFLUENZA VACCINE  Completed   Fall Risk 10/29/2016 01/21/2018 11/17/2018  Falls in the past year? Yes Yes 1  Was there an injury with Fall? Yes No 0  Fall Risk Category Calculator - - 2  Fall Risk Category - - Moderate  Patient Fall Risk Level - - Moderate fall risk  Patient at Risk for Falls Due to - - History of fall(s);Impaired mobility;Medication side effect;Impaired balance/gait;Mental status change  Fall risk Follow up Falls prevention discussed - Falls evaluation completed;Education provided;Falls prevention discussed  Fall risk Follow up - - with staff at [employer]:    Vitals:   09/24/21 0923  BP:  (!) 145/91  Pulse: 90  Resp: 18  Temp: 98.9 F (37.2 C)  SpO2: 97%   There is no height or weight on file to calculate BMI. Physical Exam Vitals and nursing note reviewed.  Constitutional:      General: She is not in acute distress.    Appearance: She is not diaphoretic.  HENT:     Head: Normocephalic and atraumatic.     Nose: Nose normal. No congestion.     Mouth/Throat:     Mouth: Mucous membranes are moist.     Pharynx: Oropharynx is clear.  Neck:     Vascular: No JVD.  Cardiovascular:     Rate and Rhythm: Normal rate and regular rhythm.     Heart sounds: No murmur heard. Pulmonary:     Effort: Pulmonary effort is normal. No respiratory distress.     Breath sounds: Rales present. No wheezing.  Abdominal:     General: Abdomen is flat. Bowel sounds are normal. There is no distension.     Palpations: Abdomen is soft.     Tenderness: There is no abdominal tenderness.  Musculoskeletal:     Cervical back: No rigidity or tenderness.     Right lower leg: No edema.     Left lower leg: No edema.  Lymphadenopathy:     Cervical: No cervical adenopathy.  Skin:    General: Skin is warm and dry.  Neurological:     General: No focal deficit present.     Mental Status: She is alert.     Comments: Asleep difficult to arouse. Does stir with physical stim. Not able to f/c and non verbal at baseline.     Labs reviewed: Recent Labs    03/12/21 0000  NA 142  K 4.7  CL 104  CO2 25*  BUN 11  CREATININE 0.8  CALCIUM 9.6   No results for input(s): AST, ALT, ALKPHOS, BILITOT, PROT, ALBUMIN in the last 8760 hours. Recent Labs    03/12/21 0000  WBC 7.0  HGB 15.2  HCT 44  PLT 206   Lab Results  Component Value Date   TSH 1.93 02/01/2019   Lab Results  Component Value Date   HGBA1C 5.3 02/21/2020   Lab Results  Component Value Date   CHOL 183 11/14/2017   HDL 64 11/14/2017   LDLCALC 102 11/14/2017   TRIG 86 11/14/2017   CHOLHDL 2.9 07/15/2016    Significant  Diagnostic Results in last 30 days:  No results found.  Assessment/Plan  Covid infection Maintain isolation for 10 days. Discussed starting paxlovid but pt is now having difficulty taking pills. Due to goals of care and dysphagia will not prescribe paxlovid at this time. Continue to monitor.   2. Hypoxia Resolved with oxygen, due to covid Would not order xray as we have  agreed not to treat any infection due the advanced nature of her dementia and poor QOL. Continue supportive hospice care.   3. Oropharyngeal dysphagia Continue puree diet if alert and able to swallow. Upright for all meals. 1:1 supervision with asp prec in place.    Family/ staff Communication: Left message with Jenny Reichmann  Labs/tests ordered:  Rapid covid

## 2021-10-25 ENCOUNTER — Non-Acute Institutional Stay (SKILLED_NURSING_FACILITY): Payer: PPO | Admitting: Adult Health

## 2021-10-25 ENCOUNTER — Encounter: Payer: Self-pay | Admitting: Adult Health

## 2021-10-25 DIAGNOSIS — I680 Cerebral amyloid angiopathy: Secondary | ICD-10-CM | POA: Diagnosis not present

## 2021-10-25 DIAGNOSIS — F482 Pseudobulbar affect: Secondary | ICD-10-CM

## 2021-10-25 DIAGNOSIS — F03918 Unspecified dementia, unspecified severity, with other behavioral disturbance: Secondary | ICD-10-CM | POA: Diagnosis not present

## 2021-10-25 DIAGNOSIS — E210 Primary hyperparathyroidism: Secondary | ICD-10-CM | POA: Diagnosis not present

## 2021-10-25 DIAGNOSIS — R1312 Dysphagia, oropharyngeal phase: Secondary | ICD-10-CM

## 2021-10-25 DIAGNOSIS — K5901 Slow transit constipation: Secondary | ICD-10-CM

## 2021-10-25 DIAGNOSIS — I10 Essential (primary) hypertension: Secondary | ICD-10-CM

## 2021-10-25 NOTE — Progress Notes (Signed)
Location:   [facility] Room Number: 125 Place of Service:  SNF ([phone]) Provider:  Royal Hawthorn, NP  Royal Hawthorn, NP  Patient Care Team: Royal Hawthorn, NP as PCP - General (Nurse Practitioner) Irine Seal, MD as Attending Physician (Urology) Jodi Marble, MD as Consulting Physician (Otolaryngology) Martinique, Amy, MD as Consulting Physician (Dermatology) Arta Silence, MD as Consulting Physician (Gastroenterology) Dohmeier, Asencion Partridge, MD as Consulting Physician (Neurology) Geanie Kenning, MD as Consulting Physician (Psychiatry)  Extended Emergency Contact Information Primary Emergency Contact: [name] Address: [address]          [place], [place] [address] [name] of [place] Phone: [phone] Mobile Phone: [phone] Relation: Spouse  Code Status:  DNR Hospice Goals of care: Advanced Directive information Advanced Directives 08/20/2021  Does Patient Have a Medical Advance Directive? Yes  Type of Advance Directive Living will;Out of facility DNR (pink MOST or yellow form)  Does patient want to make changes to medical advance directive? No - Patient declined  Copy of [place] in Chart? -  Would patient like information on creating a medical advance directive? -  Pre-existing out of facility DNR order (yellow form or pink MOST form) Yellow form placed in chart (order not valid for inpatient use)     Chief Complaint  Patient presents with   Medical Management of Chronic Issues    HPI:  Pt is a 81 y.o. female seen today for medical management of chronic diseases.   PMH significant for cerebral amyloid angiopathy with dementia, carotid artery stenosis, renal stones, UTI, PBA, DDD, TIA, HTN, IBS, and constipation. She is followed by hospice for dementia.  She recovered from covid last month, only had supportive measures.  SBP 130-160 range.  Currently on zyprexa for restlessness and attempts to  get out of bed.There are no reports of aggressive behaviors. One fall to the mat on 2/1 without injury was noted.  Currently on a puree diet, tolerating well. Weight down 5 lbs in the past month.  Past Medical History:  Diagnosis Date   Arthritis    back and neck   Carotid artery stenosis    Cerebral amyloid angiopathy (CODE) 06/20/2016   Chronic rhinosinusitis    DDD (degenerative disc disease)    Depression    Diverticulosis of colon    Frequency of urination    Glaucoma    Headache disorder 12/13/2014   Right temple   Hearing loss    History of kidney stones    MULTIBLE SURGICAL INTERVENTIONS   History of skin cancer HX TOPICAL FACIAL [place] 2013   History of TIA (transient ischemic attack)    NOV 2013-- NO RESIDUAL   Hypertension    IBS (irritable bowel syndrome)    Memory difficulty 05/14/2016   Memory loss    Mild asthma    COLD INDUCED   Right ureteral stone    Seasonal and perennial allergic rhinitis    Shingles    EYEBALL 09-17-2013   Spondylosis    Past Surgical History:  Procedure Laterality Date   BUNIONECTOMY     BILATERAL   CALDWELL LUC     sinus drainage procedure   CYSTO/ BILATERAL URETEROSCOPIC STONE EXTRACTIONS  02-09-2003   CYSTO/ BILATERAL URETEROSCOPY/  LASER OF STONES LEFT SIDE  06-02-2007   CYSTOSCOPY/RETROGRADE/URETEROSCOPY/STONE EXTRACTION WITH BASKET  03/31/2012   Procedure: CYSTOSCOPY/RETROGRADE/URETEROSCOPY/STONE EXTRACTION WITH BASKET;  Surgeon: Malka So, MD;  Location: [facility];  Service: Urology;  Laterality: Right;  CYSTOSCOPY/RETROGRADE/URETEROSCOPY/STONE EXTRACTION WITH BASKET Right 03/29/2013   Procedure: CYSTOSCOPY//URETEROSCOPY/STONE EXTRACTION WITH BASKET;  Surgeon: Malka So, MD;  [facility];  Service: Urology;  Laterality: Right;   CYSTOSCOPY/RETROGRADE/URETEROSCOPY/STONE EXTRACTION WITH BASKET Right 10/14/2013   Procedure: CYSTOSCOPY/RETROGRADE/URETEROSCOPY/STONE EXTRACTION WITH BASKET;  Surgeon: Irine Seal, MD;  [facility];  Service: Urology;  Laterality: Right;   ESOPHAGOGASTRODUODENOSCOPY (EGD) WITH PROPOFOL N/A 04/12/2015   Procedure: ESOPHAGOGASTRODUODENOSCOPY (EGD) WITH PROPOFOL;  Surgeon: Arta Silence, MD;  [facility];  Service: Endoscopy;  Laterality: N/A;   EUS N/A 04/12/2015   Procedure: ESOPHAGEAL ENDOSCOPIC ULTRASOUND (EUS) RADIAL;  Surgeon: Arta Silence, MD;  [facility];  Service: Endoscopy;  Laterality: N/A;   EXTRACORPOREAL SHOCK WAVE LITHOTRIPSY  about 4081/ Dr Reece Agar   Never completed procedure-could not tolerate pain. Had seizures due to pain   EYE SURGERY  AGE 74   INJURY   HERNIA REPAIR  AGE 54   HOLMIUM LASER APPLICATION Right 4/48/1856   Procedure: HOLMIUM LASER APPLICATION;  Surgeon: Irine Seal, MD;  Location: [facility];  Service: Urology;  Laterality: Right;   LAPAROSCOPIC CHOLECYSTECTOMY  01-12-2005   LEFT URETEROSCOPIC STONE EXTRACTION    LAST ONE 04-28-2009   SEVERAL SURGICAL STONE EXTRACTIONS   LUMBAR LAMINECTOMY  09-30-2002   L4 - 5;  SPONDYLOLISTHISES W/ STENOSIS AND RADICULOPATHY   PERCUTANEOUS NEPHROSTOLITHOTOMY  1975   RIGHT URETEROSCOPIC STONE EXTRACTION  LAST ONE 02-21-2011   MULTIPLE SURGICAL STONE EXTRACTIONS   TONSILECTOMY, ADENOIDECTOMY, BILATERAL MYRINGOTOMY AND TUBES  CHILD   VAGINAL HYSTERECTOMY  1991   W/ BILATERAL SALPINGOOPHECTOMY    Allergies  Allergen Reactions   Donepezil Hcl    Ace Inhibitors Cough   Hctz [Hydrochlorothiazide]     Bladder problems   Triptans     Avoid per neuro    Allergies as of 10/25/2021       Reactions   Donepezil Hcl    Ace Inhibitors Cough   Hctz [hydrochlorothiazide]    Bladder problems   Triptans    Avoid per neuro        Medication List        Accurate as of October 25, 2021  2:42 PM. If you have any questions, ask your nurse or doctor.          DULoxetine 30 MG capsule Commonly known as: CYMBALTA Take 30 mg by  mouth daily.   OLANZapine 2.5 MG tablet Commonly known as: ZYPREXA Take 2.5 mg by mouth at bedtime.   polyethylene glycol 17 g packet Commonly known as: MIRALAX / GLYCOLAX Take 17 g by mouth daily. With 8 oz of fluid   sennosides-docusate sodium 8.6-50 MG tablet Commonly known as: SENOKOT-S Take 2 tablets by mouth in the morning and at bedtime.        Review of Systems  Unable to perform ROS: Dementia   Immunization History  Administered Date(s) Administered   Hepatitis A 11/15/2003, 06/11/2004, 12/25/2012   Hepatitis B 11/14/2008, 12/15/2008   Influenza Split 06/17/2011   Influenza Whole 06/16/2010   Influenza, High Dose Seasonal PF 05/27/2016, 07/15/2019   Influenza,inj,Quad PF,6+ Mos 07/07/2018   Influenza-Unspecified 07/10/2017, 06/20/2021   Moderna Covid-19 Vaccine Bivalent Booster 52yrs & up 06/27/2021   Moderna SARS-COV2 Booster Vaccination 07/26/2020, 05/08/2021   Moderna Sars-Covid-2 Vaccination 09/21/2019, 10/19/2019, 07/26/2020, 06/27/2021   Pneumococcal Conjugate-13 08/09/2014   Pneumococcal Polysaccharide-23 04/22/2011, 11/15/2011   Tdap 11/14/2008, 12/25/2012   Typhoid Inactivated 12/25/2012   Yellow Fever 12/25/2012   Zoster  Recombinat (Shingrix) 01/03/2017, 04/04/2017   Zoster, Live 05/07/2012   Pertinent  Health Maintenance Due  Topic Date Due   INFLUENZA VACCINE  Completed   Fall Risk 10/29/2016 01/21/2018 11/17/2018 10/26/2021  Falls in the past year? Yes Yes 1 1  Was there an injury with Fall? Yes No 0 0  Fall Risk Category Calculator - - 2 2  Fall Risk Category - - Moderate Moderate  Patient Fall Risk Level - - Moderate fall risk High fall risk  Patient at Risk for Falls Due to - - History of fall(s);Impaired mobility;Medication side effect;Impaired balance/gait;Mental status change History of fall(s)  Fall risk Follow up Falls prevention discussed - Falls evaluation completed;Education provided;Falls prevention discussed Falls evaluation completed   Fall risk Follow up - - with staff at [facility]:    Vitals:   10/25/21 1436  BP: 130/75  Pulse: 77  Resp: 17  Temp: 98 F (36.7 C)  SpO2: 95%  Weight: 138 lb 12.8 oz (63 kg)  Height: 5\' 2"  (1.575 m)   Body mass index is 25.39 kg/m. Wt Readings from Last 3 Encounters:  10/25/21 138 lb 12.8 oz (63 kg)  08/20/21 143 lb (64.9 kg)  08/03/21 143 lb (64.9 kg)    Physical Exam Vitals and nursing note reviewed.  Constitutional:      General: She is not in acute distress.    Appearance: She is not diaphoretic.  HENT:     Head: Normocephalic and atraumatic.     Mouth/Throat:     Pharynx: Oropharynx is clear.  Eyes:     Conjunctiva/sclera: Conjunctivae normal.     Pupils: Pupils are equal, round, and reactive to light.  Neck:     Vascular: No JVD.  Cardiovascular:     Rate and Rhythm: Normal rate and regular rhythm.     Heart sounds: No murmur heard. Pulmonary:     Effort: Pulmonary effort is normal. No respiratory distress.     Breath sounds: Normal breath sounds. No wheezing.  Abdominal:     General: Abdomen is flat. Bowel sounds are normal.     Palpations: Abdomen is soft.  Musculoskeletal:        General: No swelling, tenderness, deformity or signs of injury.     Cervical back: No rigidity or tenderness.     Right lower leg: No edema.     Left lower leg: No edema.  Lymphadenopathy:     Cervical: No cervical adenopathy.  Skin:    General: Skin is warm and dry.  Neurological:     General: No focal deficit present.     Mental Status: She is alert. Mental status is at baseline.  Psychiatric:     Comments: flat    Labs reviewed: Recent Labs    03/12/21 0000  NA 142  K 4.7  CL 104  CO2 25*  BUN 11  CREATININE 0.8  CALCIUM 9.6   No results for input(s): AST, ALT, ALKPHOS, BILITOT, PROT, ALBUMIN in the last 8760 hours. Recent Labs    03/12/21 0000  WBC 7.0  HGB 15.2  HCT 44  PLT 206   Lab Results  Component Value  Date   TSH 1.93 02/01/2019   Lab Results  Component Value Date   HGBA1C 5.3 02/21/2020   Lab Results  Component Value Date   CHOL 183 11/14/2017   HDL 64 11/14/2017   LDLCALC 102 11/14/2017   TRIG 86 11/14/2017   CHOLHDL 2.9  07/15/2016    Significant Diagnostic Results in last 30 days:  No results found.  Assessment/Plan  Essential hypertension Off medication due to goals of care.   Cerebral amyloid angiopathy (CODE) Noted on MRI In 2017 Multiple punctate foci susceptibility artifacts scattered throughout both cerebral hemispheres, consistent with chronic small micro hemorrhages, and compatible with known history of cerebral amyloid angiopathy.Moderate chronic microvascular ischemic disease  Followed by hospice now due to severe stage of dementia. Continue supportive care. Continue Zyprexa for restlessness and agitation.   Slow transit constipation Controlled with miralax  Dysphagia Continue D1 diet  Asp prec 1:1 sypervision   Chronic dementia, with behavioral disturbance As above.   PBA (pseudobulbar affect) Due to the advancing stages of her disease process this is no longer an issue  Hyperparathyroidism, primary [facility]) Lab Results  Component Value Date   CALCIUM 9.6 03/12/2021  Last PTH WNL.    Family/ staff Communication: nurse  Labs/tests ordered:  NA due to goals of care.

## 2021-10-26 ENCOUNTER — Encounter: Payer: Self-pay | Admitting: Adult Health

## 2021-10-26 NOTE — Assessment & Plan Note (Signed)
Off medication due to goals of care.

## 2021-10-26 NOTE — Assessment & Plan Note (Signed)
As above.

## 2021-10-26 NOTE — Assessment & Plan Note (Signed)
Lab Results  Component Value Date   CALCIUM 9.6 03/12/2021  Last PTH WNL.

## 2021-10-26 NOTE — Assessment & Plan Note (Signed)
Noted on MRI In 2017 Multiple punctate foci susceptibility artifacts scattered throughout both cerebral hemispheres, consistent with chronic small micro hemorrhages, and compatible with known history of cerebral amyloid angiopathy.Moderate chronic microvascular ischemic disease  Followed by hospice now due to severe stage of dementia. Continue supportive care. Continue Zyprexa for restlessness and agitation.

## 2021-10-26 NOTE — Assessment & Plan Note (Signed)
Controlled with miralax.   

## 2021-10-26 NOTE — Assessment & Plan Note (Signed)
Due to the advancing stages of her disease process this is no longer an issue

## 2021-10-26 NOTE — Assessment & Plan Note (Signed)
Continue D1 diet  Asp prec 1:1 sypervision

## 2021-11-05 ENCOUNTER — Encounter: Payer: Self-pay | Admitting: Nurse Practitioner

## 2021-11-05 ENCOUNTER — Non-Acute Institutional Stay (SKILLED_NURSING_FACILITY): Payer: PPO | Admitting: Internal Medicine

## 2021-11-05 ENCOUNTER — Encounter: Payer: Self-pay | Admitting: Internal Medicine

## 2021-11-05 DIAGNOSIS — I10 Essential (primary) hypertension: Secondary | ICD-10-CM

## 2021-11-05 DIAGNOSIS — R1312 Dysphagia, oropharyngeal phase: Secondary | ICD-10-CM

## 2021-11-05 DIAGNOSIS — F03918 Unspecified dementia, unspecified severity, with other behavioral disturbance: Secondary | ICD-10-CM | POA: Diagnosis not present

## 2021-11-05 DIAGNOSIS — I680 Cerebral amyloid angiopathy: Secondary | ICD-10-CM

## 2021-11-05 DIAGNOSIS — H1032 Unspecified acute conjunctivitis, left eye: Secondary | ICD-10-CM | POA: Diagnosis not present

## 2021-11-05 NOTE — Progress Notes (Signed)
Location:   [facility] Room Number: 125 Place of Service:  SNF [phone]) Provider:  Veleta Miners MD  Royal Hawthorn, NP  Patient Care Team: Royal Hawthorn, NP as PCP - General (Nurse Practitioner) Irine Seal, MD as Attending Physician (Urology) Jodi Marble, MD as Consulting Physician (Otolaryngology) Martinique, Amy, MD as Consulting Physician (Dermatology) Arta Silence, MD as Consulting Physician (Gastroenterology) Dohmeier, Asencion Partridge, MD as Consulting Physician (Neurology) Geanie Kenning, MD as Consulting Physician (Psychiatry)  Extended Emergency Contact Information Primary Emergency Contact: [name] Address: [address]          [place], [place] [address] [name] of [place] Phone: [phone] Mobile Phone: [phone] Relation: Spouse  Code Status:  DNR Hospice Goals of care: Advanced Directive information Advanced Directives 11/05/2021  Does Patient Have a Medical Advance Directive? Yes  Type of Paramedic of [place];Out of facility DNR (pink MOST or yellow form);Living will  Does patient want to make changes to medical advance directive? No - Patient declined  Copy of [place] in Chart? Yes - validated most recent copy scanned in chart (See row information)  Would patient like information on creating a medical advance directive? -  Pre-existing out of facility DNR order (yellow form or pink MOST form) -     Chief Complaint  Patient presents with   Acute Visit    Redness in her eyes    HPI:  Pt is a 81 y.o. 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 and Depression with Dysphagia  Per Nurses have noticed redness in left eye with mild discharge Patient has aphasia but staff has seen her rubbing her eyes Cant get more history She is [place] help with Feeding Past  Medical History:  Diagnosis Date   Arthritis    back and neck   Carotid artery stenosis    Cerebral amyloid angiopathy (CODE) 06/20/2016   Chronic rhinosinusitis    DDD (degenerative disc disease)    Depression    Diverticulosis of colon    Frequency of urination    Glaucoma    Headache disorder 12/13/2014   Right temple   Hearing loss    History of kidney stones    MULTIBLE SURGICAL INTERVENTIONS   History of skin cancer HX TOPICAL FACIAL [place] JULY 2013   History of TIA (transient ischemic attack)    NOV 2013-- NO RESIDUAL   Hypertension    IBS (irritable bowel syndrome)    Memory difficulty 05/14/2016   Memory loss    Mild asthma    COLD INDUCED   Right ureteral stone    Seasonal and perennial allergic rhinitis    Shingles    EYEBALL 09-17-2013   Spondylosis    Past Surgical History:  Procedure Laterality Date   BUNIONECTOMY     BILATERAL   CALDWELL LUC     sinus drainage procedure   CYSTO/ BILATERAL URETEROSCOPIC STONE EXTRACTIONS  02-09-2003   CYSTO/ BILATERAL URETEROSCOPY/  LASER OF STONES LEFT SIDE  06-02-2007   CYSTOSCOPY/RETROGRADE/URETEROSCOPY/STONE EXTRACTION WITH BASKET  03/31/2012   Procedure: CYSTOSCOPY/RETROGRADE/URETEROSCOPY/STONE EXTRACTION WITH BASKET;  Surgeon: Malka So, MD;  Location: [facility];  Service: Urology;  Laterality: Right;    CYSTOSCOPY/RETROGRADE/URETEROSCOPY/STONE EXTRACTION WITH BASKET Right 03/29/2013   Procedure: CYSTOSCOPY//URETEROSCOPY/STONE EXTRACTION WITH BASKET;  Surgeon: Malka So, MD;  Location: [facility];  Service: Urology;  Laterality: Right;   CYSTOSCOPY/RETROGRADE/URETEROSCOPY/STONE EXTRACTION WITH BASKET Right 10/14/2013   Procedure: CYSTOSCOPY/RETROGRADE/URETEROSCOPY/STONE EXTRACTION WITH BASKET;  Surgeon: Irine Seal, MD;  Location: [facility];  Service: Urology;  Laterality: Right;   ESOPHAGOGASTRODUODENOSCOPY (EGD) WITH PROPOFOL N/A 04/12/2015   Procedure: ESOPHAGOGASTRODUODENOSCOPY (EGD) WITH  PROPOFOL;  Surgeon: Arta Silence, MD;  Location: [facility];  Service: Endoscopy;  Laterality: N/A;   EUS N/A 04/12/2015   Procedure: ESOPHAGEAL ENDOSCOPIC ULTRASOUND (EUS) RADIAL;  Surgeon: Arta Silence, MD;  Location: [facility];  Service: Endoscopy;  Laterality: N/A;   EXTRACORPOREAL SHOCK WAVE LITHOTRIPSY  about 0867/ Dr Reece Agar   Never completed procedure-could not tolerate pain. Had seizures due to pain   EYE SURGERY  AGE 89   INJURY   HERNIA REPAIR  AGE 69   HOLMIUM LASER APPLICATION Right 03/04/5092   Procedure: HOLMIUM LASER APPLICATION;  Surgeon: Irine Seal, MD;  Location: [facility];  Service: Urology;  Laterality: Right;   LAPAROSCOPIC CHOLECYSTECTOMY  01-12-2005   LEFT URETEROSCOPIC STONE EXTRACTION    LAST ONE 04-28-2009   SEVERAL SURGICAL STONE EXTRACTIONS   LUMBAR LAMINECTOMY  09-30-2002   L4 - 5;  SPONDYLOLISTHISES W/ STENOSIS AND RADICULOPATHY   PERCUTANEOUS NEPHROSTOLITHOTOMY  1975   RIGHT URETEROSCOPIC STONE EXTRACTION  LAST ONE 02-21-2011   MULTIPLE SURGICAL STONE EXTRACTIONS   TONSILECTOMY, ADENOIDECTOMY, BILATERAL MYRINGOTOMY AND TUBES  CHILD   VAGINAL HYSTERECTOMY  1991   W/ BILATERAL SALPINGOOPHECTOMY    Allergies  Allergen Reactions   Donepezil Hcl    Ace Inhibitors Cough   Hctz [Hydrochlorothiazide]     Bladder problems   Triptans     Avoid per neuro    Allergies as of 11/05/2021       Reactions   Donepezil Hcl    Ace Inhibitors Cough   Hctz [hydrochlorothiazide]    Bladder problems   Triptans    Avoid per neuro        Medication List        Accurate as of November 05, 2021  2:28 PM. If you have any questions, ask your nurse or doctor.          DULoxetine 30 MG capsule Commonly known as: CYMBALTA Take 30 mg by mouth daily.   OLANZapine 2.5 MG tablet Commonly known as: ZYPREXA Take 2.5 mg by mouth at bedtime.   polyethylene glycol 17 g packet Commonly known as: MIRALAX / GLYCOLAX Take 17 g by mouth  daily. With 8 oz of fluid   sennosides-docusate sodium 8.6-50 MG tablet Commonly known as: SENOKOT-S Take 2 tablets by mouth in the morning and at bedtime.        Review of Systems  Unable to perform ROS: Dementia   Immunization History  Administered Date(s) Administered   Hepatitis A 11/15/2003, 06/11/2004, 12/25/2012   Hepatitis B 11/14/2008, 12/15/2008   Influenza Split 06/17/2011   Influenza Whole 06/16/2010   Influenza, High Dose Seasonal PF 05/27/2016, 07/15/2019   Influenza,inj,Quad PF,6+ Mos 07/07/2018   Influenza-Unspecified 07/10/2017, 06/20/2021   Moderna Covid-19 Vaccine Bivalent Booster 99yrs & up 06/27/2021   Moderna SARS-COV2 Booster Vaccination 07/26/2020, 05/08/2021   Moderna Sars-Covid-2 Vaccination 09/21/2019, 10/19/2019, 07/26/2020, 06/27/2021   Pneumococcal Conjugate-13 08/09/2014   Pneumococcal Polysaccharide-23 04/22/2011, 11/15/2011   Tdap 11/14/2008, 12/25/2012   Typhoid Inactivated 12/25/2012   Yellow Fever 12/25/2012   Zoster Recombinat (Shingrix) 01/03/2017, 04/04/2017   Zoster, Live 05/07/2012   Pertinent  Health Maintenance Due  Topic Date Due   INFLUENZA VACCINE  Completed  Fall Risk 10/29/2016 01/21/2018 11/17/2018 10/26/2021  Falls in the past year? Yes Yes 1 1  Was there an injury with Fall? Yes No 0 0  Fall Risk Category Calculator - - 2 2  Fall Risk Category - - Moderate Moderate  Patient Fall Risk Level - - Moderate fall risk High fall risk  Patient at Risk for Falls Due to - - History of fall(s);Impaired mobility;Medication side effect;Impaired balance/gait;Mental status change History of fall(s)  Fall risk Follow up Falls prevention discussed - Falls evaluation completed;Education provided;Falls prevention discussed Falls evaluation completed  Fall risk Follow up - - with staff at [facility]:    Vitals:   11/05/21 1425  BP: 123/72  Pulse: 89  Resp: 18  Temp: 98.6 F (37 C)  SpO2: 95%  Weight: 138  lb 12.8 oz (63 kg)  Height: 5\' 2"  (1.575 m)   Body mass index is 25.39 kg/m. Physical Exam Vitals reviewed.  Constitutional:      Appearance: Normal appearance.  HENT:     Head: Normocephalic.     Nose: Nose normal.     Mouth/Throat:     Mouth: Mucous membranes are moist.     Pharynx: Oropharynx is clear.  Eyes:     Pupils: Pupils are equal, round, and reactive to light.     Comments: Left Eye had Conjuctivitis  Cardiovascular:     Rate and Rhythm: Normal rate and regular rhythm.     Pulses: Normal pulses.     Heart sounds: Normal heart sounds. No murmur heard. Pulmonary:     Effort: Pulmonary effort is normal.     Breath sounds: Normal breath sounds.  Abdominal:     General: Abdomen is flat. Bowel sounds are normal.     Palpations: Abdomen is soft.  Musculoskeletal:        General: No swelling.     Cervical back: Neck supple.  Skin:    General: Skin is warm.  Neurological:     Mental Status: She is alert.  Psychiatric:        Mood and Affect: Mood normal.        Thought Content: Thought content normal.    Labs reviewed: Recent Labs    03/12/21 0000  NA 142  K 4.7  CL 104  CO2 25*  BUN 11  CREATININE 0.8  CALCIUM 9.6   No results for input(s): AST, ALT, ALKPHOS, BILITOT, PROT, ALBUMIN in the last 8760 hours. Recent Labs    03/12/21 0000  WBC 7.0  HGB 15.2  HCT 44  PLT 206   Lab Results  Component Value Date   TSH 1.93 02/01/2019   Lab Results  Component Value Date   HGBA1C 5.3 02/21/2020   Lab Results  Component Value Date   CHOL 183 11/14/2017   HDL 64 11/14/2017   LDLCALC 102 11/14/2017   TRIG 86 11/14/2017   CHOLHDL 2.9 07/15/2016    Significant Diagnostic Results in last 30 days:  No results found.  Assessment/Plan Acute conjunctivitis of left eye, unspecified acute conjunctivitis type Ocuflox 2 drops QID for 2 weeks Nurses will Clean the eyes   Cerebral amyloid angiopathy (CODE) Supportive care Chronic dementia, with  behavioral disturbance On Zyprexa Failed previous [place] Oropharyngeal dysphagia Weight stable Enrolled [facility] hospice   Family/ staff Communication:   Labs/tests ordered:

## 2021-11-05 NOTE — Progress Notes (Signed)
[facility] Room Number: 125 Place of Service:  SNF (31) Provider:    Royal Hawthorn, NP  Patient Care Team: Royal Hawthorn, NP as PCP - General (Nurse Practitioner) Irine Seal, MD as Attending Physician (Urology) Jodi Marble, MD as Consulting Physician (Otolaryngology) Martinique, Amy, MD as Consulting Physician (Dermatology) Arta Silence, MD as Consulting Physician (Gastroenterology) Dohmeier, Asencion Partridge, MD as Consulting Physician (Neurology) Geanie Kenning, MD as Consulting Physician (Psychiatry)  Extended Emergency Contact Information Primary Emergency Contact: [name] Address: [address]          [place], [place] [address] [name] of [place] Phone: [phone] Mobile Phone: [phone] Relation: Spouse  Code Status:  DNR Hospice Goals of care: Advanced Directive information Advanced Directives 11/05/2021  Does Patient Have a Medical Advance Directive? Yes  Type of Paramedic of [place];Out of facility DNR (pink MOST or yellow form);Living will  Does patient want to make changes to medical advance directive? No - Patient declined  Copy of [place] in Chart? Yes - validated most recent copy scanned in chart (See row information)  Would patient like information on creating a medical advance directive? -  Pre-existing out of facility DNR order (yellow form or pink MOST form) -     Chief Complaint  Patient presents with   Acute Visit    Redness in her eyes    HPI:  Pt is a 81 y.o. female seen today for an acute visit for    Past Medical History:  Diagnosis Date   Arthritis    back and neck   Carotid artery stenosis    Cerebral amyloid angiopathy (CODE) 06/20/2016   Chronic rhinosinusitis    DDD (degenerative disc disease)    Depression    Diverticulosis of colon    Frequency of urination    Glaucoma    Headache disorder 12/13/2014   Right temple   Hearing loss     History of kidney stones    MULTIBLE SURGICAL INTERVENTIONS   History of skin cancer HX TOPICAL FACIAL [place] 2013   History of TIA (transient ischemic attack)    NOV 2013-- NO RESIDUAL   Hypertension    IBS (irritable bowel syndrome)    Memory difficulty 05/14/2016   Memory loss    Mild asthma    COLD INDUCED   Right ureteral stone    Seasonal and perennial allergic rhinitis    Shingles    EYEBALL 09-17-2013   Spondylosis    Past Surgical History:  Procedure Laterality Date   BUNIONECTOMY     BILATERAL   CALDWELL LUC     sinus drainage procedure   CYSTO/ BILATERAL URETEROSCOPIC STONE EXTRACTIONS  02-09-2003   CYSTO/ BILATERAL URETEROSCOPY/  LASER OF STONES LEFT SIDE  06-02-2007   CYSTOSCOPY/RETROGRADE/URETEROSCOPY/STONE EXTRACTION WITH BASKET  03/31/2012   Procedure: CYSTOSCOPY/RETROGRADE/URETEROSCOPY/STONE EXTRACTION WITH BASKET;  Surgeon: Malka So, MD;  Location: [facility];  Service: Urology;  Laterality: Right;    CYSTOSCOPY/RETROGRADE/URETEROSCOPY/STONE EXTRACTION WITH BASKET Right 03/29/2013   Procedure: CYSTOSCOPY//URETEROSCOPY/STONE EXTRACTION WITH BASKET;  Surgeon: Malka So, MD;  [facility];  Service: Urology;  Laterality: Right;   CYSTOSCOPY/RETROGRADE/URETEROSCOPY/STONE EXTRACTION WITH BASKET Right 10/14/2013   Procedure: CYSTOSCOPY/RETROGRADE/URETEROSCOPY/STONE EXTRACTION WITH BASKET;  Surgeon: Irine Seal, MD;  Location[facility];  Service: Urology;  Laterality: Right;   ESOPHAGOGASTRODUODENOSCOPY (EGD) WITH PROPOFOL N/A 04/12/2015   Procedure: ESOPHAGOGASTRODUODENOSCOPY (EGD) WITH PROPOFOL;  Surgeon: Arta Silence, MD;  Location:  [facility];  Service: Endoscopy;  Laterality: N/A;   EUS N/A 04/12/2015   Procedure: ESOPHAGEAL ENDOSCOPIC ULTRASOUND (EUS) RADIAL;  Surgeon: Arta Silence, MD;  [facility];  Service: Endoscopy;  Laterality: N/A;   EXTRACORPOREAL SHOCK WAVE LITHOTRIPSY  about  8182/ Dr Reece Agar   Never completed procedure-could not tolerate pain. Had seizures due to pain   EYE SURGERY  AGE 89   INJURY   HERNIA REPAIR  AGE 81   HOLMIUM LASER APPLICATION Right 9/93/7169   Procedure: HOLMIUM LASER APPLICATION;  Surgeon: Irine Seal, MD;  Location: [facility];  Service: Urology;  Laterality: Right;   LAPAROSCOPIC CHOLECYSTECTOMY  01-12-2005   LEFT URETEROSCOPIC STONE EXTRACTION    LAST ONE 04-28-2009   SEVERAL SURGICAL STONE EXTRACTIONS   LUMBAR LAMINECTOMY  09-30-2002   L4 - 5;  SPONDYLOLISTHISES W/ STENOSIS AND RADICULOPATHY   PERCUTANEOUS NEPHROSTOLITHOTOMY  1975   RIGHT URETEROSCOPIC STONE EXTRACTION  LAST ONE 02-21-2011   MULTIPLE SURGICAL STONE EXTRACTIONS   TONSILECTOMY, ADENOIDECTOMY, BILATERAL MYRINGOTOMY AND TUBES  CHILD   VAGINAL HYSTERECTOMY  1991   W/ BILATERAL SALPINGOOPHECTOMY    Allergies  Allergen Reactions   Donepezil Hcl    Ace Inhibitors Cough   Hctz [Hydrochlorothiazide]     Bladder problems   Triptans     Avoid per neuro    Allergies as of 11/05/2021       Reactions   Donepezil Hcl    Ace Inhibitors Cough   Hctz [hydrochlorothiazide]    Bladder problems   Triptans    Avoid per neuro        Medication List        Accurate as of November 05, 2021  2:21 PM. If you have any questions, ask your nurse or doctor.          DULoxetine 30 MG capsule Commonly known as: CYMBALTA Take 30 mg by mouth daily.   OLANZapine 2.5 MG tablet Commonly known as: ZYPREXA Take 2.5 mg by mouth at bedtime.   polyethylene glycol 17 g packet Commonly known as: MIRALAX / GLYCOLAX Take 17 g by mouth daily. With 8 oz of fluid   sennosides-docusate sodium 8.6-50 MG tablet Commonly known as: SENOKOT-S Take 2 tablets by mouth in the morning and at bedtime.        Review of Systems  Immunization History  Administered Date(s) Administered   Hepatitis A 11/15/2003, 06/11/2004, 12/25/2012   Hepatitis B  11/14/2008, 12/15/2008   Influenza Split 06/17/2011   Influenza Whole 06/16/2010   Influenza, High Dose Seasonal PF 05/27/2016, 07/15/2019   Influenza,inj,Quad PF,6+ Mos 07/07/2018   Influenza-Unspecified 07/10/2017, 06/20/2021   Moderna Covid-19 Vaccine Bivalent Booster 54yrs & up 06/27/2021   Moderna SARS-COV2 Booster Vaccination 07/26/2020, 05/08/2021   Moderna Sars-Covid-2 Vaccination 09/21/2019, 10/19/2019, 07/26/2020, 06/27/2021   Pneumococcal Conjugate-13 08/09/2014   Pneumococcal Polysaccharide-23 04/22/2011, 11/15/2011   Tdap 11/14/2008, 12/25/2012   Typhoid Inactivated 12/25/2012   Yellow Fever 12/25/2012   Zoster Recombinat (Shingrix) 01/03/2017, 04/04/2017   Zoster, Live 05/07/2012   Pertinent  Health Maintenance Due  Topic Date Due   INFLUENZA VACCINE  Completed   Fall Risk 10/29/2016 01/21/2018 11/17/2018 10/26/2021  Falls in the past year? Yes Yes 1 1  Was there an injury with Fall? Yes No 0 0  Fall Risk Category Calculator - - 2 2  Fall Risk Category - - Moderate Moderate  Patient Fall Risk Level - - Moderate fall risk High fall risk  Patient at Risk for  Falls Due to - - History of fall(s);Impaired mobility;Medication side effect;Impaired balance/gait;Mental status change History of fall(s)  Fall risk Follow up Falls prevention discussed - Falls evaluation completed;Education provided;Falls prevention discussed Falls evaluation completed  Fall risk Follow up - - with staff at [facility]:    Vitals:   11/05/21 1417  BP: 123/72  Pulse: 89  Resp: 18  Temp: 98.6 F (37 C)  SpO2: 95%  Weight: 138 lb 12.8 oz (63 kg)  Height: 5\' 2"  (1.575 m)   Body mass index is 25.39 kg/m. Physical Exam  Labs reviewed: Recent Labs    03/12/21 0000  NA 142  K 4.7  CL 104  CO2 25*  BUN 11  CREATININE 0.8  CALCIUM 9.6   No results for input(s): AST, ALT, ALKPHOS, BILITOT, PROT, ALBUMIN in the last 8760 hours. Recent Labs     03/12/21 0000  WBC 7.0  HGB 15.2  HCT 44  PLT 206   Lab Results  Component Value Date   TSH 1.93 02/01/2019   Lab Results  Component Value Date   HGBA1C 5.3 02/21/2020   Lab Results  Component Value Date   CHOL 183 11/14/2017   HDL 64 11/14/2017   LDLCALC 102 11/14/2017   TRIG 86 11/14/2017   CHOLHDL 2.9 07/15/2016    Significant Diagnostic Results in last 30 days:  No results found.  Assessment/Plan There are no diagnoses linked to this encounter.   Family/ staff Communication:   Labs/tests ordered:    This encounter was created in error - please disregard.

## 2021-11-23 ENCOUNTER — Encounter: Payer: Self-pay | Admitting: Adult Health

## 2021-11-23 ENCOUNTER — Non-Acute Institutional Stay (SKILLED_NURSING_FACILITY): Payer: PPO | Admitting: Adult Health

## 2021-11-23 DIAGNOSIS — I10 Essential (primary) hypertension: Secondary | ICD-10-CM

## 2021-11-23 DIAGNOSIS — K5901 Slow transit constipation: Secondary | ICD-10-CM

## 2021-11-23 DIAGNOSIS — F03918 Unspecified dementia, unspecified severity, with other behavioral disturbance: Secondary | ICD-10-CM | POA: Diagnosis not present

## 2021-11-23 DIAGNOSIS — R1312 Dysphagia, oropharyngeal phase: Secondary | ICD-10-CM

## 2021-11-23 NOTE — Assessment & Plan Note (Signed)
Severe stage, followed by hospice. ?Continue zyprexa and cymbalta for periods of restlessness or anxiety ?

## 2021-11-23 NOTE — Progress Notes (Signed)
Location:   [place] Room Number: 125 Place of Service:  SNF ([phone]) Provider:  Royal Hawthorn, NP  Royal Hawthorn, NP  Patient Care Team: Royal Hawthorn, NP as PCP - General (Nurse Practitioner) Irine Seal, MD as Attending Physician (Urology) Jodi Marble, MD as Consulting Physician (Otolaryngology) Martinique, Amy, MD as Consulting Physician (Dermatology) Arta Silence, MD as Consulting Physician (Gastroenterology) Dohmeier, Asencion Partridge, MD as Consulting Physician (Neurology) Geanie Kenning, MD as Consulting Physician (Psychiatry)  Extended Emergency Contact Information Primary Emergency Contact: [name] Address: [address]          [place], [place] [address] [name] of [place] Phone: [phone] Mobile Phone: [phone] Relation: Spouse  Code Status:  DNR Hospice Goals of care: Advanced Directive information Advanced Directives 11/23/2021  Does Patient Have a Medical Advance Directive? Yes  Type of Paramedic of [place];Out of facility DNR (pink MOST or yellow form);Living will  Does patient want to make changes to medical advance directive? No - Patient declined  Copy of [place] in Chart? Yes - validated most recent copy scanned in chart (See row information)  Would patient like information on creating a medical advance directive? -  Pre-existing out of facility DNR order (yellow form or pink MOST form) -     Chief Complaint  Patient presents with   Medical Management of Chronic Issues    HPI:  Pt is a 81 y.o. female seen today for medical management of chronic diseases.     PMH significant for cerebral amyloid angiopathy with dementia, carotid artery stenosis, renal stones, UTI, PBA, DDD, TIA, HTN, IBS, and constipation. She is followed by hospice for dementia.   HTN: BP in the 120-160 range  Dysphagia: on a puree diet with NTL, no current issues with coughing or  choking.   Dementia: no issues with behaviors. Non verbal most of the time and not able to f/c. On hospice  She has a slow incremental weight loss in the past 6 months  Treated for conjunctivitis with ocuflox which resolved.  Past Surgical History:  Procedure Laterality Date   BUNIONECTOMY     BILATERAL   CALDWELL LUC     sinus drainage procedure   CYSTO/ BILATERAL URETEROSCOPIC STONE EXTRACTIONS  02-09-2003   CYSTO/ BILATERAL URETEROSCOPY/  LASER OF STONES LEFT SIDE  06-02-2007   CYSTOSCOPY/RETROGRADE/URETEROSCOPY/STONE EXTRACTION WITH BASKET  03/31/2012   Procedure: CYSTOSCOPY/RETROGRADE/URETEROSCOPY/STONE EXTRACTION WITH BASKET;  Surgeon: Malka So, MD;  Location[facility];  Service: Urology;  Laterality: Right;    CYSTOSCOPY/RETROGRADE/URETEROSCOPY/STONE EXTRACTION WITH BASKET Right 03/29/2013   Procedure: CYSTOSCOPY//URETEROSCOPY/STONE EXTRACTION WITH BASKET;  Surgeon: Malka So, MD;  Location: [facility];  Service: Urology;  Laterality: Right;   CYSTOSCOPY/RETROGRADE/URETEROSCOPY/STONE EXTRACTION WITH BASKET Right 10/14/2013   Procedure: CYSTOSCOPY/RETROGRADE/URETEROSCOPY/STONE EXTRACTION WITH BASKET;  Surgeon: Irine Seal, MD;  Location: [facility];  Service: Urology;  Laterality: Right;   ESOPHAGOGASTRODUODENOSCOPY (EGD) WITH PROPOFOL N/A 04/12/2015   Procedure: ESOPHAGOGASTRODUODENOSCOPY (EGD) WITH PROPOFOL;  Surgeon: Arta Silence, MD;  Location: [facility];  Service: Endoscopy;  Laterality: N/A;   EUS N/A 04/12/2015   Procedure: ESOPHAGEAL ENDOSCOPIC ULTRASOUND (EUS) RADIAL;  Surgeon: Arta Silence, MD;  Location: [facility];  Service: Endoscopy;  Laterality: N/A;   EXTRACORPOREAL SHOCK WAVE LITHOTRIPSY  about 5093/ Dr Reece Agar   Never completed procedure-could not tolerate pain. Had seizures due to pain   EYE SURGERY  AGE 43   INJURY   HERNIA REPAIR  AGE  7   HOLMIUM LASER APPLICATION Right 05/17/5175   Procedure: HOLMIUM LASER  APPLICATION;  Surgeon: Irine Seal, MD;  Location: [facility];  Service: Urology;  Laterality: Right;   LAPAROSCOPIC CHOLECYSTECTOMY  01-12-2005   LEFT URETEROSCOPIC STONE EXTRACTION    LAST ONE 04-28-2009   SEVERAL SURGICAL STONE EXTRACTIONS   LUMBAR LAMINECTOMY  09-30-2002   L4 - 5;  SPONDYLOLISTHISES W/ STENOSIS AND RADICULOPATHY   PERCUTANEOUS NEPHROSTOLITHOTOMY  1975   RIGHT URETEROSCOPIC STONE EXTRACTION  LAST ONE 02-21-2011   MULTIPLE SURGICAL STONE EXTRACTIONS   TONSILECTOMY, ADENOIDECTOMY, BILATERAL MYRINGOTOMY AND TUBES  CHILD   VAGINAL HYSTERECTOMY  1991   W/ BILATERAL SALPINGOOPHECTOMY    Allergies  Allergen Reactions   Donepezil Hcl    Ace Inhibitors Cough   Hctz [Hydrochlorothiazide]     Bladder problems   Triptans     Avoid per neuro    Allergies as of 11/23/2021       Reactions   Donepezil Hcl    Ace Inhibitors Cough   Hctz [hydrochlorothiazide]    Bladder problems   Triptans    Avoid per neuro        Medication List        Accurate as of November 23, 2021 11:09 AM. If you have any questions, ask your nurse or doctor.          DULoxetine 30 MG capsule Commonly known as: CYMBALTA Take 30 mg by mouth daily.   OLANZapine 2.5 MG tablet Commonly known as: ZYPREXA Take 2.5 mg by mouth at bedtime.   polyethylene glycol 17 g packet Commonly known as: MIRALAX / GLYCOLAX Take 17 g by mouth daily. With 8 oz of fluid   sennosides-docusate sodium 8.6-50 MG tablet Commonly known as: SENOKOT-S Take 2 tablets by mouth in the morning and at bedtime.        Review of Systems  Unable to perform ROS: Dementia   Immunization History  Administered Date(s) Administered   Hepatitis A 11/15/2003, 06/11/2004, 12/25/2012   Hepatitis B 11/14/2008, 12/15/2008   Influenza Split 06/17/2011   Influenza Whole 06/16/2010   Influenza, High Dose Seasonal PF 05/27/2016, 07/15/2019   Influenza,inj,Quad PF,6+ Mos 07/07/2018   Influenza-Unspecified  07/10/2017, 06/20/2021   Moderna Covid-19 Vaccine Bivalent Booster 17yr & up 06/27/2021   Moderna SARS-COV2 Booster Vaccination 07/26/2020, 05/08/2021   Moderna Sars-Covid-2 Vaccination 09/21/2019, 10/19/2019, 07/26/2020, 06/27/2021   Pneumococcal Conjugate-13 08/09/2014   Pneumococcal Polysaccharide-23 04/22/2011, 11/15/2011   Tdap 11/14/2008, 12/25/2012   Typhoid Inactivated 12/25/2012   Yellow Fever 12/25/2012   Zoster Recombinat (Shingrix) 01/03/2017, 04/04/2017   Zoster, Live 05/07/2012   Pertinent  Health Maintenance Due  Topic Date Due   INFLUENZA VACCINE  Completed   Fall Risk 10/29/2016 01/21/2018 11/17/2018 10/26/2021  Falls in the past year? Yes Yes 1 1  Was there an injury with Fall? Yes No 0 0  Fall Risk Category Calculator - - 2 2  Fall Risk Category - - Moderate Moderate  Patient Fall Risk Level - - Moderate fall risk High fall risk  Patient at Risk for Falls Due to - - History of fall(s);Impaired mobility;Medication side effect;Impaired balance/gait;Mental status change History of fall(s)  Fall risk Follow up Falls prevention discussed - Falls evaluation completed;Education provided;Falls prevention discussed Falls evaluation completed  Fall risk Follow up - - with staff at [facility]    Vitals:   11/23/21 1106  BP: (!) 159/78  Pulse: 82  Resp: 19  Temp: 98.6 F (37 C)  SpO2: 94%  Weight: 136 lb 9.6 oz (62 kg)  Height: '5\' 2"'$  (1.575 m)   Body mass index is 24.98 kg/m. Physical Exam Vitals and nursing note reviewed.  Constitutional:      General: She is not in acute distress.    Appearance: She is not diaphoretic.  HENT:     Head: Normocephalic and atraumatic.  Eyes:     Conjunctiva/sclera: Conjunctivae normal.     Pupils: Pupils are equal, round, and reactive to light.  Neck:     Vascular: No JVD.  Cardiovascular:     Rate and Rhythm: Normal rate and regular rhythm.     Heart sounds: No murmur heard. Pulmonary:      Effort: Pulmonary effort is normal. No respiratory distress.     Breath sounds: Normal breath sounds. No wheezing.  Abdominal:     General: Abdomen is flat. Bowel sounds are normal.     Palpations: Abdomen is soft.  Skin:    General: Skin is warm and dry.  Neurological:     General: No focal deficit present.     Mental Status: She is alert. Mental status is at baseline.    Labs reviewed: Recent Labs    03/12/21 0000  NA 142  K 4.7  CL 104  CO2 25*  BUN 11  CREATININE 0.8  CALCIUM 9.6   No results for input(s): AST, ALT, ALKPHOS, BILITOT, PROT, ALBUMIN in the last 8760 hours. Recent Labs    03/12/21 0000  WBC 7.0  HGB 15.2  HCT 44  PLT 206   Lab Results  Component Value Date   TSH 1.93 02/01/2019   Lab Results  Component Value Date   HGBA1C 5.3 02/21/2020   Lab Results  Component Value Date   CHOL 183 11/14/2017   HDL 64 11/14/2017   LDLCALC 102 11/14/2017   TRIG 86 11/14/2017   CHOLHDL 2.9 07/15/2016    Significant Diagnostic Results in last 30 days:  No results found.  Assessment/Plan  Essential hypertension No longer on meds due to goals of care. BP acceptable.   Slow transit constipation Controlled with miralax and senokot s  Chronic dementia, with behavioral disturbance Severe stage, followed by hospice. Continue zyprexa and cymbalta for periods of restlessness or anxiety  Dysphagia Continue puree diet with NTL   Family/ staff Communication:   Labs/tests ordered:

## 2021-11-23 NOTE — Assessment & Plan Note (Signed)
Continue puree diet with NTL ?

## 2021-11-23 NOTE — Assessment & Plan Note (Signed)
Controlled with miralax and senokot s ?

## 2021-11-23 NOTE — Assessment & Plan Note (Signed)
No longer on meds due to goals of care. BP acceptable.  ?

## 2021-12-24 ENCOUNTER — Encounter: Payer: Self-pay | Admitting: Internal Medicine

## 2021-12-24 ENCOUNTER — Non-Acute Institutional Stay (SKILLED_NURSING_FACILITY): Payer: PPO | Admitting: Internal Medicine

## 2021-12-24 DIAGNOSIS — F03918 Unspecified dementia, unspecified severity, with other behavioral disturbance: Secondary | ICD-10-CM | POA: Diagnosis not present

## 2021-12-24 DIAGNOSIS — R1312 Dysphagia, oropharyngeal phase: Secondary | ICD-10-CM | POA: Diagnosis not present

## 2021-12-24 DIAGNOSIS — I680 Cerebral amyloid angiopathy: Secondary | ICD-10-CM

## 2021-12-24 NOTE — Progress Notes (Signed)
?Location:   [place] ?Nursing Home Room Number: 993 ?Place of Service:  SNF (31) ?Provider:  Veleta Miners MD ? ?Royal Hawthorn, NP ? ?Patient Care Team: ?Royal Hawthorn, NP as PCP - General (Nurse Practitioner) ?Irine Seal, MD as Attending Physician (Urology) ?Jodi Marble, MD as Consulting Physician (Otolaryngology) ?Martinique, Amy, MD as Consulting Physician (Dermatology) ?Arta Silence, MD as Consulting Physician (Gastroenterology) ?Dohmeier, Asencion Partridge, MD as Consulting Physician (Neurology) ?Geanie Kenning, MD as Consulting Physician (Psychiatry) ? ?Extended Emergency Contact Information ?Primary Emergency Contact: Kennard,John D ?Address: [address] ?         [place], [place] [address] [place] ?Home Phone: [phone] ?Mobile Phone: [phone] ?Relation: Spouse ? ?Code Status:  DNR Hospice ?Goals of care: Advanced Directive information ? ?  12/24/2021  ? 12:36 PM  ?Advanced Directives  ?Does Patient Have a Medical Advance Directive? Yes  ?Type of Paramedic of [place];Out of facility DNR (pink MOST or yellow form);Living will  ?Does patient want to make changes to medical advance directive? No - Patient declined  ?Copy of [place] in Chart? Yes - validated most recent copy scanned in chart (See row information)  ? ? ? ?Chief Complaint  ?Patient presents with  ? Medical Management of Chronic Issues  ? ? ?HPI:  ?Pt is a 81 y.o. [name] seen today for medical management of chronic diseases.   ? ?Patient has h/o Cerebral Amyloid Angiopathy with now End stage dementia and Depression with Dysphagia ? ?  ?She is stable. No new Nursing issues. No Behavior issues ?Her weight is stable ?Hoyer lift dependent ?Gets help with her feeding ?Eats 75% ?No Falls ?Wt Readings from Last 3 Encounters:  ?12/24/21 140 lb 9.6 oz (63.8 kg)  ?11/23/21 136 lb 9.6 oz (62 kg)  ?11/05/21 138 lb 12.8 oz (63 kg)  ? ?Past Medical History:  ?Diagnosis  Date  ? Arthritis   ? back and neck  ? Carotid artery stenosis   ? Cerebral amyloid angiopathy (CODE) 06/20/2016  ? Chronic rhinosinusitis   ? DDD (degenerative disc disease)   ? Depression   ? Diverticulosis of colon   ? Frequency of urination   ? Glaucoma   ? Headache disorder 12/13/2014  ? Right temple  ? Hearing loss   ? History of kidney stones   ? MULTIBLE SURGICAL INTERVENTIONS  ? History of skin cancer HX TOPICAL FACIAL [place] 2013  ? History of TIA (transient ischemic attack)   ? NOV 2013-- NO RESIDUAL  ? Hypertension   ? IBS (irritable bowel syndrome)   ? Memory difficulty 05/14/2016  ? Memory loss   ? Mild asthma   ? COLD INDUCED  ? Right ureteral stone   ? Seasonal and perennial allergic rhinitis   ? Shingles   ? EYEBALL 09-17-2013  ? Spondylosis   ? ?Past Surgical History:  ?Procedure Laterality Date  ? BUNIONECTOMY    ? BILATERAL  ? CALDWELL LUC    ? sinus drainage procedure  ? CYSTO/ BILATERAL URETEROSCOPIC STONE EXTRACTIONS  02-09-2003  ? CYSTO/ BILATERAL URETEROSCOPY/  LASER OF STONES LEFT SIDE  06-02-2007  ? CYSTOSCOPY/RETROGRADE/URETEROSCOPY/STONE EXTRACTION WITH BASKET  03/31/2012  ? Procedure: CYSTOSCOPY/RETROGRADE/URETEROSCOPY/STONE EXTRACTION WITH BASKET;  Surgeon: Malka So, MD;  [facility];  Service: Urology;  Laterality: Right;   ? CYSTOSCOPY/RETROGRADE/URETEROSCOPY/STONE EXTRACTION WITH BASKET Right 03/29/2013  ? Procedure: CYSTOSCOPY//URETEROSCOPY/STONE EXTRACTION WITH BASKET;  Surgeon: Malka So, MD;  Location: [facility];  Service: Urology;  Laterality: Right;  ? CYSTOSCOPY/RETROGRADE/URETEROSCOPY/STONE EXTRACTION WITH BASKET Right 10/14/2013  ? Procedure: CYSTOSCOPY/RETROGRADE/URETEROSCOPY/STONE EXTRACTION WITH BASKET;  Surgeon: Irine Seal, MD;  Location[facility];  Service: Urology;  Laterality: Right;  ? ESOPHAGOGASTRODUODENOSCOPY (EGD) WITH PROPOFOL N/A 04/12/2015  ? Procedure: ESOPHAGOGASTRODUODENOSCOPY (EGD) WITH PROPOFOL;  Surgeon: Arta Silence, MD;  Location: [facility];  Service: Endoscopy;  Laterality: N/A;  ? EUS N/A 04/12/2015  ? Procedure: ESOPHAGEAL ENDOSCOPIC ULTRASOUND (EUS) RADIAL;  Surgeon: Arta Silence, MD;  Location: [facility];  Service: Endoscopy;  Laterality: N/A;  ? EXTRACORPOREAL SHOCK WAVE LITHOTRIPSY  about [place] Dr Reece Agar  ? Never completed procedure-could not tolerate pain. Had seizures due to pain  ? EYE SURGERY  AGE 22  ? INJURY  ? HERNIA REPAIR  AGE 80  ? HOLMIUM LASER APPLICATION Right 7/32/2025  ? Procedure: HOLMIUM LASER APPLICATION;  Surgeon: Irine Seal, MD;  Location: [facility];  Service: Urology;  Laterality: Right;  ? LAPAROSCOPIC CHOLECYSTECTOMY  01-12-2005  ? LEFT URETEROSCOPIC STONE EXTRACTION    LAST ONE 04-28-2009  ? SEVERAL SURGICAL STONE EXTRACTIONS  ? LUMBAR LAMINECTOMY  09-30-2002  ? L4 - 5;  SPONDYLOLISTHISES W/ STENOSIS AND RADICULOPATHY  ? PERCUTANEOUS NEPHROSTOLITHOTOMY  1975  ? RIGHT URETEROSCOPIC STONE EXTRACTION  LAST ONE 02-21-2011  ? MULTIPLE SURGICAL STONE EXTRACTIONS  ? TONSILECTOMY, ADENOIDECTOMY, BILATERAL MYRINGOTOMY AND TUBES  CHILD  ? VAGINAL HYSTERECTOMY  1991  ? W/ BILATERAL SALPINGOOPHECTOMY  ? ? ?Allergies  ?Allergen Reactions  ? Donepezil Hcl   ? Ace Inhibitors Cough  ? Hctz [Hydrochlorothiazide]   ?  Bladder problems  ? Triptans   ?  Avoid per neuro  ? ? ?Allergies as of 12/24/2021   ? ?   Reactions  ? Donepezil Hcl   ? Ace Inhibitors Cough  ? Hctz [hydrochlorothiazide]   ? Bladder problems  ? Triptans   ? Avoid per neuro  ? ?  ? ?  ?Medication List  ?  ? ?  ? Accurate as of December 24, 2021 12:37 PM. If you have any questions, ask your nurse or doctor.  ?  ?  ? ?  ? ?DULoxetine 30 MG capsule ?Commonly known as: CYMBALTA ?Take 30 mg by mouth daily. ?  ?OLANZapine 2.5 MG tablet ?Commonly known as: ZYPREXA ?Take 2.5 mg by mouth at bedtime. ?  ?polyethylene glycol 17 g packet ?Commonly known as: MIRALAX / GLYCOLAX ?Take 17 g by mouth daily. With 8 oz of fluid ?   ?sennosides-docusate sodium 8.6-50 MG tablet ?Commonly known as: SENOKOT-S ?Take 2 tablets by mouth in the morning and at bedtime. ?  ? ?  ? ? ?Review of Systems  ?Unable to perform ROS: Dementia  ? ?Immunization History  ?Administered Date(s) Administered  ? Hepatitis A 11/15/2003, 06/11/2004, 12/25/2012  ? Hepatitis B 11/14/2008, 12/15/2008  ? Influenza Split 06/17/2011  ? Influenza Whole 06/16/2010  ? Influenza, High Dose Seasonal PF 05/27/2016, 07/15/2019  ? Influenza,inj,Quad PF,6+ Mos 07/07/2018  ? Influenza-Unspecified 07/10/2017, 06/20/2021  ? Moderna Covid-19 Vaccine Bivalent Booster 36yr & up 06/27/2021  ? Moderna SARS-COV2 Booster Vaccination 07/26/2020, 05/08/2021  ? Moderna Sars-Covid-2 Vaccination 09/21/2019, 10/19/2019, 07/26/2020, 06/27/2021  ? Pneumococcal Conjugate-13 08/09/2014  ? Pneumococcal Polysaccharide-23 04/22/2011, 11/15/2011  ? Tdap 11/14/2008, 12/25/2012  ? Typhoid Inactivated 12/25/2012  ? Yellow Fever 12/25/2012  ? Zoster Recombinat (Shingrix) 01/03/2017, 04/04/2017  ? Zoster, Live 05/07/2012  ? ?Pertinent  Health Maintenance Due  ?Topic Date Due  ? INFLUENZA VACCINE  04/16/2022  ? ? ?  10/29/2016  ?  3:13 PM 01/21/2018  ? 12:44 PM 11/17/2018  ?  2:27 PM 10/26/2021  ? 11:55 AM  ?Fall Risk  ?Falls in the past year? Yes Yes 1 1  ?Was there an injury with Fall? Yes No 0 0  ?Fall Risk Category Calculator   2 2  ?Fall Risk Category   Moderate Moderate  ?Patient Fall Risk Level   Moderate fall risk High fall risk  ?Patient at Risk for Falls Due to   History of fall(s);Impaired mobility;Medication side effect;Impaired balance/gait;Mental status change History of fall(s)  ?Fall risk Follow up Falls prevention discussed  Falls evaluation completed;Education provided;Falls prevention discussed Falls evaluation completed  ?Fall risk Follow up - Comments   with staff at [facility]   ? ?Functional Status Survey: ?  ? ?Vitals:  ? 12/24/21 1232  ?BP: (!) 144/80  ?Pulse: 76  ?Resp: 18  ?Temp: 99.3 ?F  (37.4 ?C)  ?SpO2: 95%  ?Weight: 140 lb 9.6 oz (63.8 kg)  ?Height: '5\' 2"'$  (1.575 m)  ? ?Body mass index is 25.72 kg/m?Marland Kitchen ?Physical Exam ?Vitals reviewed.  ?Constitutional:   ?   Appearance: Normal appearance.  ?HENT:

## 2022-01-24 ENCOUNTER — Encounter: Payer: Self-pay | Admitting: Adult Health

## 2022-01-24 ENCOUNTER — Non-Acute Institutional Stay (SKILLED_NURSING_FACILITY): Payer: PPO | Admitting: Adult Health

## 2022-01-24 DIAGNOSIS — I10 Essential (primary) hypertension: Secondary | ICD-10-CM | POA: Diagnosis not present

## 2022-01-24 DIAGNOSIS — I680 Cerebral amyloid angiopathy: Secondary | ICD-10-CM

## 2022-01-24 DIAGNOSIS — F03918 Unspecified dementia, unspecified severity, with other behavioral disturbance: Secondary | ICD-10-CM

## 2022-01-24 DIAGNOSIS — R1312 Dysphagia, oropharyngeal phase: Secondary | ICD-10-CM

## 2022-01-24 DIAGNOSIS — G459 Transient cerebral ischemic attack, unspecified: Secondary | ICD-10-CM

## 2022-01-24 DIAGNOSIS — K5901 Slow transit constipation: Secondary | ICD-10-CM

## 2022-01-24 NOTE — Progress Notes (Signed)
?Location:  [facility]ursing Home Room Number: 125-A ?Place of Service:  SNF (31) ?Provider:  Royal Hawthorn, NP ? ?Patient Care Team: ?Royal Hawthorn, NP as PCP - General (Nurse Practitioner) ?Irine Seal, MD as Attending Physician (Urology) ?Jodi Marble, MD as Consulting Physician (Otolaryngology) ?Martinique, Amy, MD as Consulting Physician (Dermatology) ?Arta Silence, MD as Consulting Physician (Gastroenterology) ?Dohmeier, Asencion Partridge, MD as Consulting Physician (Neurology) ?Geanie Kenning, MD as Consulting Physician (Psychiatry) ? ?Extended Emergency Contact Information ?Primary Emergency Contact: Biddinger,John D ?Address: [address] ?         [place], [place] [address] [place] ?Home Phone: [phone] ?Mobile Phone: [phone] ?Relation: Spouse ? ?Code Status:  DNR ?Goals of care: Advanced Directive information ? ?  01/24/2022  ?  3:18 PM  ?Advanced Directives  ?Does Patient Have a Medical Advance Directive? Yes  ?Type of Paramedic of [place];Out of facility DNR (pink MOST or yellow form);Living will  ?Does patient want to make changes to medical advance directive? No - Patient declined  ?Copy of [place] in Chart? Yes - validated most recent copy scanned in chart (See row information)  ?Pre-existing out of facility DNR order (yellow form or pink MOST form) Yellow form placed in chart (order not valid for inpatient use)  ? ? ? ?Chief Complaint  ?Patient presents with  ? Medical Management of Chronic Issues  ?  Routine visit   ? ? ?HPI:  ?Pt is a 81 y.o. female seen today for medical management of chronic diseases.   ? ?PMH significant for cerebral amyloid angiopathy with dementia, carotid artery stenosis, renal stones, UTI, PBA, DDD, TIA, HTN, IBS, and constipation. She is followed by hospice for dementia.  ? ?HTN: BP in the 130-150s range ? ?Dysphagia: on a puree diet with NTL, no current issues with coughing or choking.   ? ?Dementia: no issues with behaviors. Non verbal most of the time and not able to f/c. On hospice ? ?Regular bowel movements recorded . ? ?Wt Readings from Last 3 Encounters:  ?01/24/22 137 lb 6.4 oz (62.3 kg)  ?12/24/21 140 lb 9.6 oz (63.8 kg)  ?11/23/21 136 lb 9.6 oz (62 kg)  ? ? ?Past Medical History:  ?Diagnosis Date  ? Arthritis   ? back and neck  ? Carotid artery stenosis   ? Cerebral amyloid angiopathy (CODE) 06/20/2016  ? Chronic rhinosinusitis   ? DDD (degenerative disc disease)   ? Depression   ? Diverticulosis of colon   ? Frequency of urination   ? Glaucoma   ? Headache disorder 12/13/2014  ? Right temple  ? Hearing loss   ? History of kidney stones   ? MULTIBLE SURGICAL INTERVENTIONS  ? History of skin cancer HX TOPICAL FACIAL [place] 2013  ? History of TIA (transient ischemic attack)   ? NOV 2013-- NO RESIDUAL  ? Hypertension   ? IBS (irritable bowel syndrome)   ? Memory difficulty 05/14/2016  ? Memory loss   ? Mild asthma   ? COLD INDUCED  ? Right ureteral stone   ? Seasonal and perennial allergic rhinitis   ? Shingles   ? EYEBALL 09-17-2013  ? Spondylosis   ? ?Past Surgical History:  ?Procedure Laterality Date  ? BUNIONECTOMY    ? BILATERAL  ? CALDWELL LUC    ? sinus drainage procedure  ? CYSTO/ BILATERAL URETEROSCOPIC STONE EXTRACTIONS  02-09-2003  ? CYSTO/ BILATERAL URETEROSCOPY/  LASER OF STONES LEFT SIDE  06-02-2007  ?  CYSTOSCOPY/RETROGRADE/URETEROSCOPY/STONE EXTRACTION WITH BASKET  03/31/2012  ? Procedure: CYSTOSCOPY/RETROGRADE/URETEROSCOPY/STONE EXTRACTION WITH BASKET;  Surgeon: Malka So, MD;  Location: [facility];  Service: Urology;  Laterality: Right;   ? CYSTOSCOPY/RETROGRADE/URETEROSCOPY/STONE EXTRACTION WITH BASKET Right 03/29/2013  ? Procedure: CYSTOSCOPY//URETEROSCOPY/STONE EXTRACTION WITH BASKET;  Surgeon: Malka So, MD;  Location: [facility];  Service: Urology;  Laterality: Right;  ? CYSTOSCOPY/RETROGRADE/URETEROSCOPY/STONE EXTRACTION WITH BASKET Right 10/14/2013  ?  Procedure: CYSTOSCOPY/RETROGRADE/URETEROSCOPY/STONE EXTRACTION WITH BASKET;  Surgeon: Irine Seal, MD;  Location: [facility];  Service: Urology;  Laterality: Right;  ? ESOPHAGOGASTRODUODENOSCOPY (EGD) WITH PROPOFOL N/A 04/12/2015  ? Procedure: ESOPHAGOGASTRODUODENOSCOPY (EGD) WITH PROPOFOL;  Surgeon: Arta Silence, MD;  Location: [facility];  Service: Endoscopy;  Laterality: N/A;  ? EUS N/A 04/12/2015  ? Procedure: ESOPHAGEAL ENDOSCOPIC ULTRASOUND (EUS) RADIAL;  Surgeon: Arta Silence, MD;  Location: [facility];  Service: Endoscopy;  Laterality: N/A;  ? EXTRACORPOREAL SHOCK WAVE LITHOTRIPSY  about [place] Dr Reece Agar  ? Never completed procedure-could not tolerate pain. Had seizures due to pain  ? EYE SURGERY  AGE 9  ? INJURY  ? HERNIA REPAIR  AGE 40  ? HOLMIUM LASER APPLICATION Right 1/95/0932  ? Procedure: HOLMIUM LASER APPLICATION;  Surgeon: Irine Seal, MD;  Location: [facility];  Service: Urology;  Laterality: Right;  ? LAPAROSCOPIC CHOLECYSTECTOMY  01-12-2005  ? LEFT URETEROSCOPIC STONE EXTRACTION    LAST ONE 04-28-2009  ? SEVERAL SURGICAL STONE EXTRACTIONS  ? LUMBAR LAMINECTOMY  09-30-2002  ? L4 - 5;  SPONDYLOLISTHISES W/ STENOSIS AND RADICULOPATHY  ? PERCUTANEOUS NEPHROSTOLITHOTOMY  1975  ? RIGHT URETEROSCOPIC STONE EXTRACTION  LAST ONE 02-21-2011  ? MULTIPLE SURGICAL STONE EXTRACTIONS  ? TONSILECTOMY, ADENOIDECTOMY, BILATERAL MYRINGOTOMY AND TUBES  CHILD  ? VAGINAL HYSTERECTOMY  1991  ? W/ BILATERAL SALPINGOOPHECTOMY  ? ? ?Allergies  ?Allergen Reactions  ? Donepezil Hcl   ? Ace Inhibitors Cough  ? Hctz [Hydrochlorothiazide]   ?  Bladder problems  ? Triptans   ?  Avoid per neuro  ? ? ?Outpatient Encounter Medications as of 01/24/2022  ?Medication Sig  ? DULoxetine (CYMBALTA) 30 MG capsule Take 30 mg by mouth daily.  ? OLANZapine (ZYPREXA) 2.5 MG tablet Take 2.5 mg by mouth at bedtime.  ? polyethylene glycol (MIRALAX / GLYCOLAX) 17 g packet Take 17 g by mouth daily. With 8 oz  of fluid  ? sennosides-docusate sodium (SENOKOT-S) 8.6-50 MG tablet Take 2 tablets by mouth in the morning and at bedtime.  ? ?No facility-administered encounter medications on file as of 01/24/2022.  ? ? ?Review of Systems  ?Unable to perform ROS: Dementia  ? ?Immunization History  ?Administered Date(s) Administered  ? Hepatitis A 11/15/2003, 06/11/2004, 12/25/2012  ? Hepatitis B 11/14/2008, 12/15/2008  ? Influenza Split 06/17/2011  ? Influenza Whole 06/16/2010  ? Influenza, High Dose Seasonal PF 05/27/2016, 07/15/2019  ? Influenza,inj,Quad PF,6+ Mos 07/07/2018  ? Influenza-Unspecified 07/10/2017, 06/20/2021  ? Moderna Covid-19 Vaccine Bivalent Booster 72yr & up 06/27/2021  ? Moderna SARS-COV2 Booster Vaccination 07/26/2020, 05/08/2021  ? Moderna Sars-Covid-2 Vaccination 09/21/2019, 10/19/2019, 07/26/2020, 06/27/2021  ? Pneumococcal Conjugate-13 08/09/2014  ? Pneumococcal Polysaccharide-23 04/22/2011, 11/15/2011  ? Tdap 11/14/2008, 12/25/2012  ? Typhoid Inactivated 12/25/2012  ? Yellow Fever 12/25/2012  ? Zoster Recombinat (Shingrix) 01/03/2017, 04/04/2017  ? Zoster, Live 05/07/2012  ? ?Pertinent  Health Maintenance Due  ?Topic Date Due  ? INFLUENZA VACCINE  04/16/2022  ? ? ?  10/29/2016  ?  3:13 PM 01/21/2018  ? 12:44 PM 11/17/2018  ?  2:27 PM 10/26/2021  ? 11:55 AM  ?Fall Risk  ?Falls in the past year? Yes Yes 1 1  ?Was there an injury with Fall? Yes No 0 0  ?Fall Risk Category Calculator   2 2  ?Fall Risk Category   Moderate Moderate  ?Patient Fall Risk Level   Moderate fall risk High fall risk  ?Patient at Risk for Falls Due to   History of fall(s);Impaired mobility;Medication side effect;Impaired balance/gait;Mental status change History of fall(s)  ?Fall risk Follow up Falls prevention discussed  Falls evaluation completed;Education provided;Falls prevention discussed Falls evaluation completed  ?Fall risk Follow up - Comments   with staff at [facility]   ? ?Functional Status Survey: ?  ? ?Vitals:  ? 01/24/22 1512   ?BP: (!) 154/78  ?Pulse: 74  ?Resp: 18  ?Temp: (!) 96.8 ?F (36 ?C)  ?SpO2: 95%  ?Weight: 140 lb (63.5 kg)  ?Height: '5\' 2"'$  (1.575 m)  ? ?Body mass index is 25.61 kg/m?Marland Kitchen ?Physical Exam ?Vitals and nursing

## 2022-03-05 ENCOUNTER — Encounter: Payer: Self-pay | Admitting: Orthopedic Surgery

## 2022-03-05 ENCOUNTER — Non-Acute Institutional Stay (SKILLED_NURSING_FACILITY): Payer: PPO | Admitting: Orthopedic Surgery

## 2022-03-05 DIAGNOSIS — K5901 Slow transit constipation: Secondary | ICD-10-CM | POA: Diagnosis not present

## 2022-03-05 DIAGNOSIS — F03918 Unspecified dementia, unspecified severity, with other behavioral disturbance: Secondary | ICD-10-CM | POA: Diagnosis not present

## 2022-03-05 DIAGNOSIS — I680 Cerebral amyloid angiopathy: Secondary | ICD-10-CM | POA: Diagnosis not present

## 2022-03-05 DIAGNOSIS — R1312 Dysphagia, oropharyngeal phase: Secondary | ICD-10-CM

## 2022-03-05 DIAGNOSIS — I10 Essential (primary) hypertension: Secondary | ICD-10-CM

## 2022-03-05 DIAGNOSIS — G459 Transient cerebral ischemic attack, unspecified: Secondary | ICD-10-CM

## 2022-03-05 NOTE — Progress Notes (Signed)
Location:  [facility] Room Number: 125/A Place of Service:  SNF (31) Provider: Yvonna Alanis, NP   Patient Care Team: Royal Hawthorn, NP as PCP - General (Nurse Practitioner) Irine Seal, MD as Attending Physician (Urology) Jodi Marble, MD as Consulting Physician (Otolaryngology) Martinique, Lutie Pickler, MD as Consulting Physician (Dermatology) Arta Silence, MD as Consulting Physician (Gastroenterology) Dohmeier, Asencion Partridge, MD as Consulting Physician (Neurology) Geanie Kenning, MD as Consulting Physician (Psychiatry)  Extended Emergency Contact Information Primary Emergency Contact: [name] Address: [address]          [place], [place] [address] [name] Phone: [phone] Mobile Phone: [phone] Relation: Spouse  Code Status:  DNR Goals of care: Advanced Directive information    03/05/2022   12:22 PM  Advanced Directives  Does Patient Have a Medical Advance Directive? Yes  Type of Paramedic of [place];Out of facility DNR (pink MOST or yellow form);Living will  Does patient want to make changes to medical advance directive? No - Patient declined  Copy of [place] in Chart? Yes - validated most recent copy scanned in chart (See row information)  Pre-existing out of facility DNR order (yellow form or pink MOST form) Yellow form placed in chart (order not valid for inpatient use)     Chief Complaint  Patient presents with   Medical Management of Chronic Issues    Routine visit.    Quality Metric Gaps    Discuss the need for additional Covid booster, or post pone if patient refuses.     HPI:  Pt is a 81 y.o. female seen today for medical management of chronic diseases.    She currently resides on the skilled nursing unit at [facility] PMH: cerebral amyloid angiopathy, HTN, TIA, diverticulosis, dysphagia, constipation, hyperparathyroidism, osteopenia, IBS, HLD, iron  deficiency anemia.   Dementia-  related to cerebral amyloid angiopathy, followed by hospice, non verbal, dependent for all ADLs, ambulates with Broda chair, some agitation with ADLs, remains on Zyprexa Cerebral amyloid angiopathy- see above Dysphagia- no recent aspirations, remains on pureed diet HTN- BUN/creat 11/0.8 02/2021, off medication at this time TIA- no recent episodes, not on asa or statin due to goals of care Constipation- LBM 06/19, remains on miralax and senokot  No recent falls or injuries.   Recent blood pressures:  06/13- 151/84  06/06- 160/86  05/30- 143/76  Recent weights:  06/01- 141 lbs  05/01- 137.4 lbs  04/05- 140.6 lbs   Past Medical History:  Diagnosis Date   Arthritis    back and neck   Carotid artery stenosis    Cerebral amyloid angiopathy (CODE) 06/20/2016   Chronic rhinosinusitis    DDD (degenerative disc disease)    Depression    Diverticulosis of colon    Frequency of urination    Glaucoma    Headache disorder 12/13/2014   Right temple   Hearing loss    History of kidney stones    MULTIBLE SURGICAL INTERVENTIONS   History of skin cancer HX TOPICAL FACIAL [place] 2013   History of TIA (transient ischemic attack)    NOV 2013-- NO RESIDUAL   Hypertension    IBS (irritable bowel syndrome)    Memory difficulty 05/14/2016   Memory loss    Mild asthma    COLD INDUCED   Right ureteral stone    Seasonal and perennial allergic rhinitis    Shingles    EYEBALL 09-17-2013   Spondylosis    Past  Surgical History:  Procedure Laterality Date   BUNIONECTOMY     BILATERAL   CALDWELL LUC     sinus drainage procedure   CYSTO/ BILATERAL URETEROSCOPIC STONE EXTRACTIONS  02-09-2003   CYSTO/ BILATERAL URETEROSCOPY/  LASER OF STONES LEFT SIDE  06-02-2007   CYSTOSCOPY/RETROGRADE/URETEROSCOPY/STONE EXTRACTION WITH BASKET  03/31/2012   Procedure: CYSTOSCOPY/RETROGRADE/URETEROSCOPY/STONE EXTRACTION WITH BASKET;  Surgeon: Malka So, MD;  Location: [facility];  Service: Urology;  Laterality: Right;    CYSTOSCOPY/RETROGRADE/URETEROSCOPY/STONE EXTRACTION WITH BASKET Right 03/29/2013   Procedure: CYSTOSCOPY//URETEROSCOPY/STONE EXTRACTION WITH BASKET;  Surgeon: Malka So, MD;  Location: [facility];  Service: Urology;  Laterality: Right;   CYSTOSCOPY/RETROGRADE/URETEROSCOPY/STONE EXTRACTION WITH BASKET Right 10/14/2013   Procedure: CYSTOSCOPY/RETROGRADE/URETEROSCOPY/STONE EXTRACTION WITH BASKET;  Surgeon: Irine Seal, MD;  Location: [facility];  Service: Urology;  Laterality: Right;   ESOPHAGOGASTRODUODENOSCOPY (EGD) WITH PROPOFOL N/A 04/12/2015   Procedure: ESOPHAGOGASTRODUODENOSCOPY (EGD) WITH PROPOFOL;  Surgeon: Arta Silence, MD;  Location: [facility];  Service: Endoscopy;  Laterality: N/A;   EUS N/A 04/12/2015   Procedure: ESOPHAGEAL ENDOSCOPIC ULTRASOUND (EUS) RADIAL;  Surgeon: Arta Silence, MD;  Location: [facility];  Service: Endoscopy;  Laterality: N/A;   EXTRACORPOREAL SHOCK WAVE LITHOTRIPSY  about 4196/ Dr Reece Agar   Never completed procedure-could not tolerate pain. Had seizures due to pain   EYE SURGERY  AGE 37   INJURY   HERNIA REPAIR  AGE 61   HOLMIUM LASER APPLICATION Right 11/07/9796   Procedure: HOLMIUM LASER APPLICATION;  Surgeon: Irine Seal, MD;  Location: [facility];  Service: Urology;  Laterality: Right;   LAPAROSCOPIC CHOLECYSTECTOMY  01-12-2005   LEFT URETEROSCOPIC STONE EXTRACTION    LAST ONE 04-28-2009   SEVERAL SURGICAL STONE EXTRACTIONS   LUMBAR LAMINECTOMY  09-30-2002   L4 - 5;  SPONDYLOLISTHISES W/ STENOSIS AND RADICULOPATHY   PERCUTANEOUS NEPHROSTOLITHOTOMY  1975   RIGHT URETEROSCOPIC STONE EXTRACTION  LAST ONE 02-21-2011   MULTIPLE SURGICAL STONE EXTRACTIONS   TONSILECTOMY, ADENOIDECTOMY, BILATERAL MYRINGOTOMY AND TUBES  CHILD   VAGINAL HYSTERECTOMY  1991   W/ BILATERAL SALPINGOOPHECTOMY    Allergies  Allergen Reactions   Donepezil Hcl    Ace Inhibitors Cough    Hctz [Hydrochlorothiazide]     Bladder problems   Triptans     Avoid per neuro    Outpatient Encounter Medications as of 03/05/2022  Medication Sig   DULoxetine (CYMBALTA) 30 MG capsule Take 30 mg by mouth daily.   OLANZapine (ZYPREXA) 2.5 MG tablet Take 2.5 mg by mouth at bedtime.   polyethylene glycol (MIRALAX / GLYCOLAX) 17 g packet Take 17 g by mouth daily. With 8 oz of fluid   sennosides-docusate sodium (SENOKOT-S) 8.6-50 MG tablet Take 2 tablets by mouth in the morning and at bedtime.   No facility-administered encounter medications on file as of 03/05/2022.    Review of Systems  Unable to perform ROS: Dementia    Immunization History  Administered Date(s) Administered   Hepatitis A 11/15/2003, 06/11/2004, 12/25/2012   Hepatitis B 11/14/2008, 12/15/2008   Influenza Split 06/17/2011   Influenza Whole 06/16/2010   Influenza, High Dose Seasonal PF 05/27/2016, 07/15/2019   Influenza,inj,Quad PF,6+ Mos 07/07/2018   Influenza-Unspecified 07/10/2017, 06/20/2021   Moderna Covid-19 Vaccine Bivalent Booster 65yr & up 06/27/2021   Moderna SARS-COV2 Booster Vaccination 07/26/2020, 05/08/2021   Moderna Sars-Covid-2 Vaccination 09/21/2019, 10/19/2019, 07/26/2020, 06/27/2021   Pneumococcal Conjugate-13 08/09/2014   Pneumococcal Polysaccharide-23 04/22/2011, 11/15/2011   Tdap 11/14/2008, 12/25/2012   Typhoid Inactivated 12/25/2012  Yellow Fever 12/25/2012   Zoster Recombinat (Shingrix) 01/03/2017, 04/04/2017   Zoster, Live 05/07/2012   Pertinent  Health Maintenance Due  Topic Date Due   INFLUENZA VACCINE  04/16/2022      10/29/2016    3:13 PM 01/21/2018   12:44 PM 11/17/2018    2:27 PM 10/26/2021   11:55 AM  Fall Risk  Falls in the past year? Yes Yes 1 1  Was there an injury with Fall? Yes No 0 0  Fall Risk Category Calculator   2 2  Fall Risk Category   Moderate Moderate  Patient Fall Risk Level   Moderate fall risk High fall risk  Patient at Risk for Falls Due to   History  of fall(s);Impaired mobility;Medication side effect;Impaired balance/gait;Mental status change History of fall(s)  Fall risk Follow up Falls prevention discussed  Falls evaluation completed;Education provided;Falls prevention discussed Falls evaluation completed  Fall risk Follow up - Comments   with staff at [facility]:    Vitals:   03/05/22 1220  BP: (!) 151/84  Pulse: 90  Resp: 17  Temp: (!) 97.5 F (36.4 C)  SpO2: 95%  Weight: 141 lb 6.4 oz (64.1 kg)  Height: '5\' 2"'$  (1.575 m)   Body mass index is 25.86 kg/m. Physical Exam Vitals reviewed.  Constitutional:      General: She is not in acute distress. HENT:     Head: Normocephalic.     Right Ear: There is no impacted cerumen.     Left Ear: There is no impacted cerumen.     Nose: Nose normal.     Mouth/Throat:     Mouth: Mucous membranes are moist.  Eyes:     General:        Right eye: No discharge.        Left eye: No discharge.  Cardiovascular:     Rate and Rhythm: Normal rate and regular rhythm.     Pulses: Normal pulses.     Heart sounds: Normal heart sounds.  Pulmonary:     Effort: Pulmonary effort is normal. No respiratory distress.     Breath sounds: Normal breath sounds. No wheezing.  Abdominal:     General: Bowel sounds are normal. There is no distension.     Palpations: Abdomen is soft.     Tenderness: There is no abdominal tenderness.  Musculoskeletal:     Cervical back: Neck supple.     Right lower leg: No edema.     Left lower leg: No edema.  Skin:    General: Skin is warm and dry.     Capillary Refill: Capillary refill takes less than 2 seconds.  Neurological:     General: No focal deficit present.     Mental Status: She is alert. Mental status is at baseline.     Motor: Weakness present.     Gait: Gait abnormal.     Comments: Broda chair  Psychiatric:        Mood and Affect: Mood normal.        Behavior: Behavior normal.        Cognition and Memory: Cognition is  impaired. Memory is impaired.     Comments: Nonverbal, does not follow commands, alert to self     Labs reviewed: Recent Labs    03/12/21 0000  NA 142  K 4.7  CL 104  CO2 25*  BUN 11  CREATININE 0.8  CALCIUM 9.6   No results for input(s): "  AST", "ALT", "ALKPHOS", "BILITOT", "PROT", "ALBUMIN" in the last 8760 hours. Recent Labs    03/12/21 0000  WBC 7.0  HGB 15.2  HCT 44  PLT 206   Lab Results  Component Value Date   TSH 1.93 02/01/2019   Lab Results  Component Value Date   HGBA1C 5.3 02/21/2020   Lab Results  Component Value Date   CHOL 183 11/14/2017   HDL 64 11/14/2017   LDLCALC 102 11/14/2017   TRIG 86 11/14/2017   CHOLHDL 2.9 07/15/2016    Significant Diagnostic Results in last 30 days:  No results found.  Assessment/Plan 1. Chronic dementia, with behavioral disturbance ([place]) - followed by hospice - dependent with all ADLs - cont skilled nursing care - cont Zyprexa   2. Cerebral amyloid angiopathy (CODE) - see above  3. Oropharyngeal dysphagia - no recent aspirations - cont pureed diet  4. Essential hypertension - controlled  5. TIA (transient ischemic attack) - not on medication due to goals of care  6. Slow transit constipation - LBM 06/19, abdomen soft - cont miralax and senokot    Family/ staff Communication: plan discussed with nurse  Labs/tests ordered:  none

## 2022-04-08 ENCOUNTER — Non-Acute Institutional Stay (SKILLED_NURSING_FACILITY): Payer: PPO | Admitting: Internal Medicine

## 2022-04-08 ENCOUNTER — Encounter: Payer: Self-pay | Admitting: Internal Medicine

## 2022-04-08 DIAGNOSIS — I680 Cerebral amyloid angiopathy: Secondary | ICD-10-CM

## 2022-04-08 DIAGNOSIS — R1312 Dysphagia, oropharyngeal phase: Secondary | ICD-10-CM | POA: Diagnosis not present

## 2022-04-08 DIAGNOSIS — F03918 Unspecified dementia, unspecified severity, with other behavioral disturbance: Secondary | ICD-10-CM

## 2022-04-08 NOTE — Progress Notes (Unsigned)
Location:    [place] Room Number: 125 Place of Service:  SNF [phone]) Provider:  Veleta Miners MD  Royal Hawthorn, NP  Patient Care Team: Royal Hawthorn, NP as PCP - General (Nurse Practitioner) Irine Seal, MD as Attending Physician (Urology) Jodi Marble, MD as Consulting Physician (Otolaryngology) Martinique, Amy, MD as Consulting Physician (Dermatology) Arta Silence, MD as Consulting Physician (Gastroenterology) Dohmeier, Asencion Partridge, MD as Consulting Physician (Neurology) Geanie Kenning, MD as Consulting Physician (Psychiatry)  Extended Emergency Contact Information Primary Emergency Contact: [name] Address: [address]          [place], [place] [address] [name] of [place] Phone: [phone] Mobile Phone: [phone] Relation: Spouse  Code Status:  DNR Hospice Goals of care: Advanced Directive information    04/08/2022    4:25 PM  Advanced Directives  Does Patient Have a Medical Advance Directive? Yes  Type of Paramedic of [place];Out of facility DNR (pink MOST or yellow form);Living will  Does patient want to make changes to medical advance directive? No - Patient declined  Copy of [place] in Chart? Yes - validated most recent copy scanned in chart (See row information)  Pre-existing out of facility DNR order (yellow form or pink MOST form) Yellow form placed in chart (order not valid for inpatient use)     Chief Complaint  Patient presents with   Medical Management of Chronic Issues    HPI:  Pt is a 81 y.o. female seen today for medical management of chronic diseases.   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 No new Nursing issues. No Behavior issues Her weight is stable Hoyer Lift dependent  Needs help with feeding Wt Readings from Last 3 Encounters:   04/08/22 140 lb 8 oz (63.7 kg)  03/05/22 141 lb 6.4 oz (64.1 kg)  01/24/22 137 lb 6.4 oz (62.3 kg)     Past Medical History:  Diagnosis Date   Arthritis    back and neck   Carotid artery stenosis    Cerebral amyloid angiopathy (CODE) 06/20/2016   Chronic rhinosinusitis    DDD (degenerative disc disease)    Depression    Diverticulosis of colon    Frequency of urination    Glaucoma    Headache disorder 12/13/2014   Right temple   Hearing loss    History of kidney stones    MULTIBLE SURGICAL INTERVENTIONS   History of skin cancer HX TOPICAL FACIAL [place] 2013   History of TIA (transient ischemic attack)    NOV 2013-- NO RESIDUAL   Hypertension    IBS (irritable bowel syndrome)    Memory difficulty 05/14/2016   Memory loss    Mild asthma    COLD INDUCED   Right ureteral stone    Seasonal and perennial allergic rhinitis    Shingles    EYEBALL 09-17-2013   Spondylosis    Past Surgical History:  Procedure Laterality Date   BUNIONECTOMY     BILATERAL   CALDWELL LUC     sinus drainage procedure   CYSTO/ BILATERAL URETEROSCOPIC STONE EXTRACTIONS  02-09-2003   CYSTO/ BILATERAL URETEROSCOPY/  LASER OF STONES LEFT SIDE  06-02-2007   CYSTOSCOPY/RETROGRADE/URETEROSCOPY/STONE EXTRACTION WITH BASKET  03/31/2012   Procedure: CYSTOSCOPY/RETROGRADE/URETEROSCOPY/STONE EXTRACTION WITH BASKET;  Surgeon: Malka So, MD;  Location: [facility];  Service: Urology;  Laterality: Right;  CYSTOSCOPY/RETROGRADE/URETEROSCOPY/STONE EXTRACTION WITH BASKET Right 03/29/2013   Procedure: CYSTOSCOPY//URETEROSCOPY/STONE EXTRACTION WITH BASKET;  Surgeon: Malka So, MD;  Location: [facility];  Service: Urology;  Laterality: Right;   CYSTOSCOPY/RETROGRADE/URETEROSCOPY/STONE EXTRACTION WITH BASKET Right 10/14/2013   Procedure: CYSTOSCOPY/RETROGRADE/URETEROSCOPY/STONE EXTRACTION WITH BASKET;  Surgeon: Irine Seal, MD;  Location: [facility];  Service: Urology;  Laterality: Right;    ESOPHAGOGASTRODUODENOSCOPY (EGD) WITH PROPOFOL N/A 04/12/2015   Procedure: ESOPHAGOGASTRODUODENOSCOPY (EGD) WITH PROPOFOL;  Surgeon: Arta Silence, MD;  Location: [facility];  Service: Endoscopy;  Laterality: N/A;   EUS N/A 04/12/2015   Procedure: ESOPHAGEAL ENDOSCOPIC ULTRASOUND (EUS) RADIAL;  Surgeon: Arta Silence, MD;  Location: [facility];  Service: Endoscopy;  Laterality: N/A;   EXTRACORPOREAL SHOCK WAVE LITHOTRIPSY  about 3790/ Dr Reece Agar   Never completed procedure-could not tolerate pain. Had seizures due to pain   EYE SURGERY  AGE 67   INJURY   HERNIA REPAIR  AGE 73   HOLMIUM LASER APPLICATION Right 2/40/9735   Procedure: HOLMIUM LASER APPLICATION;  Surgeon: Irine Seal, MD;  Location: [facility];  Service: Urology;  Laterality: Right;   LAPAROSCOPIC CHOLECYSTECTOMY  01-12-2005   LEFT URETEROSCOPIC STONE EXTRACTION    LAST ONE 04-28-2009   SEVERAL SURGICAL STONE EXTRACTIONS   LUMBAR LAMINECTOMY  09-30-2002   L4 - 5;  SPONDYLOLISTHISES W/ STENOSIS AND RADICULOPATHY   PERCUTANEOUS NEPHROSTOLITHOTOMY  1975   RIGHT URETEROSCOPIC STONE EXTRACTION  LAST ONE 02-21-2011   MULTIPLE SURGICAL STONE EXTRACTIONS   TONSILECTOMY, ADENOIDECTOMY, BILATERAL MYRINGOTOMY AND TUBES  CHILD   VAGINAL HYSTERECTOMY  1991   W/ BILATERAL SALPINGOOPHECTOMY    Allergies  Allergen Reactions   Donepezil Hcl    Ace Inhibitors Cough   Hctz [Hydrochlorothiazide]     Bladder problems   Triptans     Avoid per neuro    Allergies as of 04/08/2022       Reactions   Donepezil Hcl    Ace Inhibitors Cough   Hctz [hydrochlorothiazide]    Bladder problems   Triptans    Avoid per neuro        Medication List        Accurate as of April 08, 2022  4:25 PM. If you have any questions, ask your nurse or doctor.          DULoxetine 30 MG capsule Commonly known as: CYMBALTA Take 30 mg by mouth daily.   OLANZapine 2.5 MG tablet Commonly known as: ZYPREXA Take 2.5 mg by mouth  at bedtime.   polyethylene glycol 17 g packet Commonly known as: MIRALAX / GLYCOLAX Take 17 g by mouth daily. With 8 oz of fluid   sennosides-docusate sodium 8.6-50 MG tablet Commonly known as: SENOKOT-S Take 2 tablets by mouth in the morning and at bedtime.        Review of Systems  Unable to perform ROS: Dementia    Immunization History  Administered Date(s) Administered   Hepatitis A 11/15/2003, 06/11/2004, 12/25/2012   Hepatitis B 11/14/2008, 12/15/2008   Influenza Split 06/17/2011   Influenza Whole 06/16/2010   Influenza, High Dose Seasonal PF 05/27/2016, 07/15/2019   Influenza,inj,Quad PF,6+ Mos 07/07/2018   Influenza-Unspecified 07/10/2017, 06/20/2021   Moderna Covid-19 Vaccine Bivalent Booster 52yr & up 06/27/2021   Moderna SARS-COV2 Booster Vaccination 07/26/2020, 05/08/2021   Moderna Sars-Covid-2 Vaccination 09/21/2019, 10/19/2019, 07/26/2020, 06/27/2021   Pneumococcal Conjugate-13 08/09/2014   Pneumococcal Polysaccharide-23 04/22/2011, 11/15/2011   Tdap 11/14/2008, 12/25/2012   Typhoid Inactivated 12/25/2012   Yellow Fever 12/25/2012  Zoster Recombinat (Shingrix) 01/03/2017, 04/04/2017   Zoster, Live 05/07/2012   Pertinent  Health Maintenance Due  Topic Date Due   INFLUENZA VACCINE  04/16/2022      10/29/2016    3:13 PM 01/21/2018   12:44 PM 11/17/2018    2:27 PM 10/26/2021   11:55 AM  Fall Risk  Falls in the past year? Yes Yes 1 1  Was there an injury with Fall? Yes No 0 0  Fall Risk Category Calculator   2 2  Fall Risk Category   Moderate Moderate  Patient Fall Risk Level   Moderate fall risk High fall risk  Patient at Risk for Falls Due to   History of fall(s);Impaired mobility;Medication side effect;Impaired balance/gait;Mental status change History of fall(s)  Fall risk Follow up Falls prevention discussed  Falls evaluation completed;Education provided;Falls prevention discussed Falls evaluation completed  Fall risk Follow up - Comments   with staff  at [facility]:    Vitals:   04/08/22 1613  BP: 137/80  Pulse: 80  Resp: 16  Temp: (!) 97.2 F (36.2 C)  SpO2: 97%  Weight: 140 lb 8 oz (63.7 kg)  Height: '5\' 2"'$  (1.575 m)   Body mass index is 25.7 kg/m. Physical Exam Vitals reviewed.  Constitutional:      Appearance: Normal appearance.  HENT:     Head: Normocephalic.     Nose: Nose normal.     Mouth/Throat:     Mouth: Mucous membranes are moist.     Pharynx: Oropharynx is clear.  Eyes:     Pupils: Pupils are equal, round, and reactive to light.  Cardiovascular:     Rate and Rhythm: Normal rate and regular rhythm.     Pulses: Normal pulses.     Heart sounds: Normal heart sounds. No murmur heard. Pulmonary:     Effort: Pulmonary effort is normal.     Breath sounds: Normal breath sounds.  Abdominal:     General: Abdomen is flat. Bowel sounds are normal.     Palpations: Abdomen is soft.  Musculoskeletal:        General: No swelling.     Cervical back: Neck supple.  Skin:    General: Skin is warm.  Neurological:     General: No focal deficit present.     Mental Status: She is alert.  Psychiatric:        Mood and Affect: Mood normal.        Thought Content: Thought content normal.     Labs reviewed: No results for input(s): "NA", "K", "CL", "CO2", "GLUCOSE", "BUN", "CREATININE", "CALCIUM", "MG", "PHOS" in the last 8760 hours. No results for input(s): "AST", "ALT", "ALKPHOS", "BILITOT", "PROT", "ALBUMIN" in the last 8760 hours. No results for input(s): "WBC", "NEUTROABS", "HGB", "HCT", "MCV", "PLT" in the last 8760 hours. Lab Results  Component Value Date   TSH 1.93 02/01/2019   Lab Results  Component Value Date   HGBA1C 5.3 02/21/2020   Lab Results  Component Value Date   CHOL 183 11/14/2017   HDL 64 11/14/2017   LDLCALC 102 11/14/2017   TRIG 86 11/14/2017   CHOLHDL 2.9 07/15/2016    Significant Diagnostic Results in last 30 days:  No results  found.  Assessment/Plan 1. Cerebral amyloid angiopathy (CODE) Enrolled in Hospice  2. Chronic dementia, with behavioral disturbance (HCC) On Zyprexa and Cymbalta Failed taper 3. Oropharyngeal dysphagia Puree diet Weight stable    Family/ staff Communication:   Labs/tests ordered:

## 2022-05-10 ENCOUNTER — Encounter: Payer: Self-pay | Admitting: Adult Health

## 2022-05-10 ENCOUNTER — Non-Acute Institutional Stay (SKILLED_NURSING_FACILITY): Payer: PPO | Admitting: Adult Health

## 2022-05-10 DIAGNOSIS — K5901 Slow transit constipation: Secondary | ICD-10-CM | POA: Diagnosis not present

## 2022-05-10 DIAGNOSIS — E210 Primary hyperparathyroidism: Secondary | ICD-10-CM | POA: Diagnosis not present

## 2022-05-10 DIAGNOSIS — F03918 Unspecified dementia, unspecified severity, with other behavioral disturbance: Secondary | ICD-10-CM

## 2022-05-10 DIAGNOSIS — I10 Essential (primary) hypertension: Secondary | ICD-10-CM | POA: Diagnosis not present

## 2022-05-10 DIAGNOSIS — G459 Transient cerebral ischemic attack, unspecified: Secondary | ICD-10-CM

## 2022-05-10 DIAGNOSIS — R1312 Dysphagia, oropharyngeal phase: Secondary | ICD-10-CM | POA: Diagnosis not present

## 2022-05-10 DIAGNOSIS — I680 Cerebral amyloid angiopathy: Secondary | ICD-10-CM | POA: Diagnosis not present

## 2022-05-10 NOTE — Progress Notes (Signed)
Location:   [place] Room Number: 125 Place of Service:  SNF [phone]) Provider:  Melvyn Novas, Edwyna Shell, NP  Patient Care Team: Royal Hawthorn, NP as PCP - General (Nurse Practitioner) Irine Seal, MD as Attending Physician (Urology) Jodi Marble, MD as Consulting Physician (Otolaryngology) Martinique, Amy, MD as Consulting Physician (Dermatology) Arta Silence, MD as Consulting Physician (Gastroenterology) Dohmeier, Asencion Partridge, MD as Consulting Physician (Neurology) Geanie Kenning, MD as Consulting Physician (Psychiatry)  Extended Emergency Contact Information Primary Emergency Contact: [name] Address: [address]          [place], [place] [address] [name] of [place] Phone: [phone] Mobile Phone: [phone] Relation: Spouse  Code Status:  DNR Goals of care: Advanced Directive information    05/10/2022    2:38 PM  Advanced Directives  Does Patient Have a Medical Advance Directive? Yes  Type of Advance Directive Living will;Out of facility DNR (pink MOST or yellow form)  Does patient want to make changes to medical advance directive? No - Patient declined  Pre-existing out of facility DNR order (yellow form or pink MOST form) Pink MOST form placed in chart (order not valid for inpatient use);Yellow form placed in chart (order not valid for inpatient use)     Chief Complaint  Patient presents with   Medical Management of Chronic Issues    HPI:  Pt is a 81 y.o. female seen today for medical management of chronic diseases.     Past Medical History:  Diagnosis Date   Arthritis    back and neck   Carotid artery stenosis    Cerebral amyloid angiopathy (CODE) 06/20/2016   Chronic rhinosinusitis    DDD (degenerative disc disease)    Depression    Diverticulosis of colon    Frequency of urination    Glaucoma    Headache disorder 12/13/2014   Right temple   Hearing loss    History of kidney stones     MULTIBLE SURGICAL INTERVENTIONS   History of skin cancer HX TOPICAL FACIAL [place] 2013   History of TIA (transient ischemic attack)    NOV 2013-- NO RESIDUAL   Hypertension    IBS (irritable bowel syndrome)    Memory difficulty 05/14/2016   Memory loss    Mild asthma    COLD INDUCED   Right ureteral stone    Seasonal and perennial allergic rhinitis    Shingles    EYEBALL 09-17-2013   Spondylosis    Past Surgical History:  Procedure Laterality Date   BUNIONECTOMY     BILATERAL   CALDWELL LUC     sinus drainage procedure   CYSTO/ BILATERAL URETEROSCOPIC STONE EXTRACTIONS  02-09-2003   CYSTO/ BILATERAL URETEROSCOPY/  LASER OF STONES LEFT SIDE  06-02-2007   CYSTOSCOPY/RETROGRADE/URETEROSCOPY/STONE EXTRACTION WITH BASKET  03/31/2012   Procedure: CYSTOSCOPY/RETROGRADE/URETEROSCOPY/STONE EXTRACTION WITH BASKET;  Surgeon: Malka So, MD;  Location[facility];  Service: Urology;  Laterality: Right;    CYSTOSCOPY/RETROGRADE/URETEROSCOPY/STONE EXTRACTION WITH BASKET Right 03/29/2013   Procedure: CYSTOSCOPY//URETEROSCOPY/STONE EXTRACTION WITH BASKET;  Surgeon: Malka So, MD;  [facility];  Service: Urology;  Laterality: Right;   CYSTOSCOPY/RETROGRADE/URETEROSCOPY/STONE EXTRACTION WITH BASKET Right 10/14/2013   Procedure: CYSTOSCOPY/RETROGRADE/URETEROSCOPY/STONE EXTRACTION WITH BASKET;  Surgeon: Irine Seal, MD;  Location: [facility];  Service: Urology;  Laterality: Right;   ESOPHAGOGASTRODUODENOSCOPY (EGD) WITH PROPOFOL N/A 04/12/2015   Procedure: ESOPHAGOGASTRODUODENOSCOPY (EGD) WITH PROPOFOL;  Surgeon: Arta Silence, MD;  [facility];  Service: Endoscopy;  Laterality: N/A;   EUS N/A 04/12/2015   Procedure: ESOPHAGEAL ENDOSCOPIC ULTRASOUND (EUS) RADIAL;  Surgeon: Arta Silence, MD;  Location[facility];  Service: Endoscopy;  Laterality: N/A;   EXTRACORPOREAL SHOCK WAVE LITHOTRIPSY  about 1610/ Dr Reece Agar   Never completed procedure-could  not tolerate pain. Had seizures due to pain   EYE SURGERY  AGE 67   INJURY   HERNIA REPAIR  AGE 29   HOLMIUM LASER APPLICATION Right 9/60/4540   Procedure: HOLMIUM LASER APPLICATION;  Surgeon: Irine Seal, MD;  Location: [facility];  Service: Urology;  Laterality: Right;   LAPAROSCOPIC CHOLECYSTECTOMY  01-12-2005   LEFT URETEROSCOPIC STONE EXTRACTION    LAST ONE 04-28-2009   SEVERAL SURGICAL STONE EXTRACTIONS   LUMBAR LAMINECTOMY  09-30-2002   L4 - 5;  SPONDYLOLISTHISES W/ STENOSIS AND RADICULOPATHY   PERCUTANEOUS NEPHROSTOLITHOTOMY  1975   RIGHT URETEROSCOPIC STONE EXTRACTION  LAST ONE 02-21-2011   MULTIPLE SURGICAL STONE EXTRACTIONS   TONSILECTOMY, ADENOIDECTOMY, BILATERAL MYRINGOTOMY AND TUBES  CHILD   VAGINAL HYSTERECTOMY  1991   W/ BILATERAL SALPINGOOPHECTOMY    Allergies  Allergen Reactions   Donepezil Hcl    Ace Inhibitors Cough   Hctz [Hydrochlorothiazide]     Bladder problems   Triptans     Avoid per neuro    Allergies as of 05/10/2022       Reactions   Donepezil Hcl    Ace Inhibitors Cough   Hctz [hydrochlorothiazide]    Bladder problems   Triptans    Avoid per neuro        Medication List        Accurate as of May 10, 2022  2:39 PM. If you have any questions, ask your nurse or doctor.          DULoxetine 30 MG capsule Commonly known as: CYMBALTA Take 30 mg by mouth daily.   OLANZapine 2.5 MG tablet Commonly known as: ZYPREXA Take 2.5 mg by mouth at bedtime.   polyethylene glycol 17 g packet Commonly known as: MIRALAX / GLYCOLAX Take 17 g by mouth daily. With 8 oz of fluid   sennosides-docusate sodium 8.6-50 MG tablet Commonly known as: SENOKOT-S Take 2 tablets by mouth in the morning and at bedtime.        Review of Systems  Immunization History  Administered Date(s) Administered   Hepatitis A 11/15/2003, 06/11/2004, 12/25/2012   Hepatitis B 11/14/2008, 12/15/2008   Influenza Split 06/17/2011   Influenza Whole  06/16/2010   Influenza, High Dose Seasonal PF 05/27/2016, 07/15/2019   Influenza,inj,Quad PF,6+ Mos 07/07/2018   Influenza-Unspecified 07/10/2017, 06/20/2021   Moderna Covid-19 Vaccine Bivalent Booster 13yr & up 06/27/2021   Moderna SARS-COV2 Booster Vaccination 07/26/2020, 05/08/2021   Moderna Sars-Covid-2 Vaccination 09/21/2019, 10/19/2019, 07/26/2020, 06/27/2021   Pneumococcal Conjugate-13 08/09/2014   Pneumococcal Polysaccharide-23 04/22/2011, 11/15/2011   Tdap 11/14/2008, 12/25/2012   Typhoid Inactivated 12/25/2012   Yellow Fever 12/25/2012   Zoster Recombinat (Shingrix) 01/03/2017, 04/04/2017   Zoster, Live 05/07/2012   Pertinent  Health Maintenance Due  Topic Date Due   INFLUENZA VACCINE  04/16/2022      10/29/2016    3:13 PM 01/21/2018   12:44 PM 11/17/2018    2:27 PM 10/26/2021   11:55 AM  Fall Risk  Falls in the past year? Yes Yes 1 1  Was there an injury with Fall? Yes No 0 0  Fall Risk Category Calculator   2 2  Fall Risk Category   Moderate Moderate  Patient  Fall Risk Level   Moderate fall risk High fall risk  Patient at Risk for Falls Due to   History of fall(s);Impaired mobility;Medication side effect;Impaired balance/gait;Mental status change History of fall(s)  Fall risk Follow up Falls prevention discussed  Falls evaluation completed;Education provided;Falls prevention discussed Falls evaluation completed  Fall risk Follow up - Comments   with staff at [place]:    Vitals:   05/10/22 1429  BP: (!) 151/78  Pulse: (!) 51  Resp: 15  Temp: 98.1 F (36.7 C)  SpO2: 97%  Weight: 139 lb 3.2 oz (63.1 kg)   Body mass index is 25.46 kg/m. Physical Exam  Labs reviewed: No results for input(s): "NA", "K", "CL", "CO2", "GLUCOSE", "BUN", "CREATININE", "CALCIUM", "MG", "PHOS" in the last 8760 hours. No results for input(s): "AST", "ALT", "ALKPHOS", "BILITOT", "PROT", "ALBUMIN" in the last 8760 hours. No results for input(s): "WBC",  "NEUTROABS", "HGB", "HCT", "MCV", "PLT" in the last 8760 hours. Lab Results  Component Value Date   TSH 1.93 02/01/2019   Lab Results  Component Value Date   HGBA1C 5.3 02/21/2020   Lab Results  Component Value Date   CHOL 183 11/14/2017   HDL 64 11/14/2017   LDLCALC 102 11/14/2017   TRIG 86 11/14/2017   CHOLHDL 2.9 07/15/2016    Significant Diagnostic Results in last 30 days:  No results found.   Family/ staff Communication:   Labs/tests ordered:

## 2022-05-10 NOTE — Progress Notes (Signed)
Location:  Occupational psychologist of Service:  SNF (31) Provider:  Royal Hawthorn, NP  Patient Care Team: Royal Hawthorn, NP as PCP - General (Nurse Practitioner) Irine Seal, MD as Attending Physician (Urology) Jodi Marble, MD as Consulting Physician (Otolaryngology) Martinique, Amy, MD as Consulting Physician (Dermatology) Arta Silence, MD as Consulting Physician (Gastroenterology) Dohmeier, Asencion Partridge, MD as Consulting Physician (Neurology) Geanie Kenning, MD as Consulting Physician (Psychiatry)  Extended Emergency Contact Information Primary Emergency Contact: [name] Address: [address]          [place], [place] [address] [name] of [place] Phone: [phone] Mobile Phone: [phone] Relation: Spouse  Code Status:  DNR Goals of care: Advanced Directive information    04/08/2022    4:25 PM  Advanced Directives  Does Patient Have a Medical Advance Directive? Yes  Type of Paramedic of [place];Out of facility DNR (pink MOST or yellow form);Living will  Does patient want to make changes to medical advance directive? No - Patient declined  Copy of [place] in Chart? Yes - validated most recent copy scanned in chart (See row information)  Pre-existing out of facility DNR order (yellow form or pink MOST form) Yellow form placed in chart (order not valid for inpatient use)     Chief Complaint  Patient presents with   Medical Management of Chronic Issues    HPI:  Pt is a 81 y.o. female seen today for medical management of chronic diseases.    PMH significant for cerebral amyloid angiopathy with dementia, carotid artery stenosis, renal stones, UTI, PBA, DDD, TIA, HTN, IBS, and constipation. She is followed by hospice for dementia.   HTN: 110-160's off meds. No edema or sob.   Dysphagia: on a puree diet with NTL, no current issues with coughing or choking. Did not eat breakfast, tends to  sleep and eat more later in the day  Dementia: no issues with behaviors. Sleeping more. Not verbal.   Regular bowel movements recorded every 2-3 days  Wt Readings from Last 3 Encounters:  05/10/22 139 lb 3.2 oz (63.1 kg)  04/08/22 140 lb 8 oz (63.7 kg)  03/05/22 141 lb 6.4 oz (64.1 kg)    Past Medical History:  Diagnosis Date   Arthritis    back and neck   Carotid artery stenosis    Cerebral amyloid angiopathy (CODE) 06/20/2016   Chronic rhinosinusitis    DDD (degenerative disc disease)    Depression    Diverticulosis of colon    Frequency of urination    Glaucoma    Headache disorder 12/13/2014   Right temple   Hearing loss    History of kidney stones    MULTIBLE SURGICAL INTERVENTIONS   History of skin cancer HX TOPICAL FACIAL [place] 2013   History of TIA (transient ischemic attack)    NOV 2013-- NO RESIDUAL   Hypertension    IBS (irritable bowel syndrome)    Memory difficulty 05/14/2016   Memory loss    Mild asthma    COLD INDUCED   Right ureteral stone    Seasonal and perennial allergic rhinitis    Shingles    EYEBALL 09-17-2013   Spondylosis    Past Surgical History:  Procedure Laterality Date   BUNIONECTOMY     BILATERAL   CALDWELL LUC     sinus drainage procedure   CYSTO/ BILATERAL URETEROSCOPIC STONE EXTRACTIONS  02-09-2003   CYSTO/ BILATERAL URETEROSCOPY/  LASER OF STONES LEFT SIDE  06-02-2007   CYSTOSCOPY/RETROGRADE/URETEROSCOPY/STONE EXTRACTION WITH BASKET  03/31/2012   Procedure: CYSTOSCOPY/RETROGRADE/URETEROSCOPY/STONE EXTRACTION WITH BASKET;  Surgeon: Malka So, MD;  Location[facility];  Service: Urology;  Laterality: Right;    CYSTOSCOPY/RETROGRADE/URETEROSCOPY/STONE EXTRACTION WITH BASKET Right 03/29/2013   Procedure: CYSTOSCOPY//URETEROSCOPY/STONE EXTRACTION WITH BASKET;  Surgeon: Malka So, MD;  Location: [facility];  Service: Urology;  Laterality: Right;   CYSTOSCOPY/RETROGRADE/URETEROSCOPY/STONE EXTRACTION WITH BASKET Right  10/14/2013   Procedure: CYSTOSCOPY/RETROGRADE/URETEROSCOPY/STONE EXTRACTION WITH BASKET;  Surgeon: Irine Seal, MD;  Location: [facility];  Service: Urology;  Laterality: Right;   ESOPHAGOGASTRODUODENOSCOPY (EGD) WITH PROPOFOL N/A 04/12/2015   Procedure: ESOPHAGOGASTRODUODENOSCOPY (EGD) WITH PROPOFOL;  Surgeon: Arta Silence, MD;  Location[facility];  Service: Endoscopy;  Laterality: N/A;   EUS N/A 04/12/2015   Procedure: ESOPHAGEAL ENDOSCOPIC ULTRASOUND (EUS) RADIAL;  Surgeon: Arta Silence, MD;  [facility];  Service: Endoscopy;  Laterality: N/A;   EXTRACORPOREAL SHOCK WAVE LITHOTRIPSY  about 2229/ Dr Reece Agar   Never completed procedure-could not tolerate pain. Had seizures due to pain   EYE SURGERY  AGE 89   INJURY   HERNIA REPAIR  AGE 28   HOLMIUM LASER APPLICATION Right 7/98/9211   Procedure: HOLMIUM LASER APPLICATION;  Surgeon: Irine Seal, MD;  Location: [facility];  Service: Urology;  Laterality: Right;   LAPAROSCOPIC CHOLECYSTECTOMY  01-12-2005   LEFT URETEROSCOPIC STONE EXTRACTION    LAST ONE 04-28-2009   SEVERAL SURGICAL STONE EXTRACTIONS   LUMBAR LAMINECTOMY  09-30-2002   L4 - 5;  SPONDYLOLISTHISES W/ STENOSIS AND RADICULOPATHY   PERCUTANEOUS NEPHROSTOLITHOTOMY  1975   RIGHT URETEROSCOPIC STONE EXTRACTION  LAST ONE 02-21-2011   MULTIPLE SURGICAL STONE EXTRACTIONS   TONSILECTOMY, ADENOIDECTOMY, BILATERAL MYRINGOTOMY AND TUBES  CHILD   VAGINAL HYSTERECTOMY  1991   W/ BILATERAL SALPINGOOPHECTOMY    Allergies  Allergen Reactions   Donepezil Hcl    Ace Inhibitors Cough   Hctz [Hydrochlorothiazide]     Bladder problems   Triptans     Avoid per neuro    Outpatient Encounter Medications as of 05/10/2022  Medication Sig   DULoxetine (CYMBALTA) 30 MG capsule Take 30 mg by mouth daily.   OLANZapine (ZYPREXA) 2.5 MG tablet Take 2.5 mg by mouth at bedtime.   polyethylene glycol (MIRALAX / GLYCOLAX) 17 g packet Take 17 g by mouth  daily. With 8 oz of fluid   sennosides-docusate sodium (SENOKOT-S) 8.6-50 MG tablet Take 2 tablets by mouth in the morning and at bedtime.   No facility-administered encounter medications on file as of 05/10/2022.    Review of Systems  Unable to perform ROS: Dementia    Immunization History  Administered Date(s) Administered   Hepatitis A 11/15/2003, 06/11/2004, 12/25/2012   Hepatitis B 11/14/2008, 12/15/2008   Influenza Split 06/17/2011   Influenza Whole 06/16/2010   Influenza, High Dose Seasonal PF 05/27/2016, 07/15/2019   Influenza,inj,Quad PF,6+ Mos 07/07/2018   Influenza-Unspecified 07/10/2017, 06/20/2021   Moderna Covid-19 Vaccine Bivalent Booster 75yr & up 06/27/2021   Moderna SARS-COV2 Booster Vaccination 07/26/2020, 05/08/2021   Moderna Sars-Covid-2 Vaccination 09/21/2019, 10/19/2019, 07/26/2020, 06/27/2021   Pneumococcal Conjugate-13 08/09/2014   Pneumococcal Polysaccharide-23 04/22/2011, 11/15/2011   Tdap 11/14/2008, 12/25/2012   Typhoid Inactivated 12/25/2012   Yellow Fever 12/25/2012   Zoster Recombinat (Shingrix) 01/03/2017, 04/04/2017   Zoster, Live 05/07/2012   Pertinent  Health Maintenance Due  Topic Date Due   INFLUENZA VACCINE  04/16/2022      10/29/2016    3:13 PM 01/21/2018  12:44 PM 11/17/2018    2:27 PM 10/26/2021   11:55 AM  Fall Risk  Falls in the past year? Yes Yes 1 1  Was there an injury with Fall? Yes No 0 0  Fall Risk Category Calculator   2 2  Fall Risk Category   Moderate Moderate  Patient Fall Risk Level   Moderate fall risk High fall risk  Patient at Risk for Falls Due to   History of fall(s);Impaired mobility;Medication side effect;Impaired balance/gait;Mental status change History of fall(s)  Fall risk Follow up Falls prevention discussed  Falls evaluation completed;Education provided;Falls prevention discussed Falls evaluation completed  Fall risk Follow up - Comments   with staff at [place]:     Vitals:   05/10/22 1429  Weight: 139 lb 3.2 oz (63.1 kg)   Body mass index is 25.46 kg/m. Physical Exam Vitals and nursing note reviewed.  Constitutional:      General: She is not in acute distress.    Appearance: She is not diaphoretic.  HENT:     Head: Normocephalic and atraumatic.  Neck:     Vascular: No JVD.  Cardiovascular:     Rate and Rhythm: Normal rate and regular rhythm.     Heart sounds: No murmur heard. Pulmonary:     Effort: Pulmonary effort is normal. No respiratory distress.     Breath sounds: Normal breath sounds. No wheezing.  Abdominal:     General: Bowel sounds are normal. There is no distension.     Palpations: Abdomen is soft.     Tenderness: There is no abdominal tenderness.  Musculoskeletal:     Right lower leg: No edema.  Skin:    General: Skin is warm and dry.  Neurological:     General: No focal deficit present.     Comments: Asleep difficult to arouse     Labs reviewed: No results for input(s): "NA", "K", "CL", "CO2", "GLUCOSE", "BUN", "CREATININE", "CALCIUM", "MG", "PHOS" in the last 8760 hours.  No results for input(s): "AST", "ALT", "ALKPHOS", "BILITOT", "PROT", "ALBUMIN" in the last 8760 hours. No results for input(s): "WBC", "NEUTROABS", "HGB", "HCT", "MCV", "PLT" in the last 8760 hours.  Lab Results  Component Value Date   TSH 1.93 02/01/2019   Lab Results  Component Value Date   HGBA1C 5.3 02/21/2020   Lab Results  Component Value Date   CHOL 183 11/14/2017   HDL 64 11/14/2017   LDLCALC 102 11/14/2017   TRIG 86 11/14/2017   CHOLHDL 2.9 07/15/2016    Significant Diagnostic Results in last 30 days:  No results found.  Assessment/Plan  1. Chronic dementia, with behavioral disturbance ([place]) Followed by hospice Continue zyprexa and cymbalta for behavior/agitation/anxiety   2. Cerebral amyloid angiopathy (CODE) Led to #1  3. TIA (transient ischemic attack) Off asa due to goals of care No new events/   4.  Essential hypertension No longer on meds due to goals of care.   5. Slow transit constipation Periodically needs prn med for constipation Continue prn senokot and miralax.   6. Oropharyngeal dysphagia Continue D 1 diet Asp prec No antibiotics per most form  7. Hyperparathyroidism, primary [facility]) Lab Results  Component Value Date   CALCIUM 9.6 03/12/2021   CAION 1.18 07/14/2016   Lab Results  Component Value Date   CALCIUM 9.6 03/12/2021   Not frequently monitoring due to hospice status

## 2022-06-13 ENCOUNTER — Encounter: Payer: Self-pay | Admitting: Adult Health

## 2022-06-13 ENCOUNTER — Non-Acute Institutional Stay (SKILLED_NURSING_FACILITY): Admitting: Adult Health

## 2022-06-13 DIAGNOSIS — K5901 Slow transit constipation: Secondary | ICD-10-CM

## 2022-06-13 DIAGNOSIS — G459 Transient cerebral ischemic attack, unspecified: Secondary | ICD-10-CM

## 2022-06-13 DIAGNOSIS — I680 Cerebral amyloid angiopathy: Secondary | ICD-10-CM | POA: Diagnosis not present

## 2022-06-13 DIAGNOSIS — F03918 Unspecified dementia, unspecified severity, with other behavioral disturbance: Secondary | ICD-10-CM

## 2022-06-13 DIAGNOSIS — R1312 Dysphagia, oropharyngeal phase: Secondary | ICD-10-CM

## 2022-06-13 DIAGNOSIS — I10 Essential (primary) hypertension: Secondary | ICD-10-CM

## 2022-06-13 NOTE — Progress Notes (Signed)
Location:  Occupational psychologist of Service:  SNF (31) Provider:  Royal Hawthorn, NP  Patient Care Team: Royal Hawthorn, NP as PCP - General (Nurse Practitioner) Irine Seal, MD as Attending Physician (Urology) Jodi Marble, MD as Consulting Physician (Otolaryngology) Martinique, Amy, MD as Consulting Physician (Dermatology) Arta Silence, MD as Consulting Physician (Gastroenterology) Dohmeier, Asencion Partridge, MD as Consulting Physician (Neurology) Geanie Kenning, MD as Consulting Physician (Psychiatry)  Extended Emergency Contact Information Primary Emergency Contact: [name] Address: [address]          [place], [place] [address] [name] of [place] Phone: [phone] Mobile Phone: [phone] Relation: Spouse  Code Status:  DNR Goals of care: Advanced Directive information    05/10/2022    2:38 PM  Advanced Directives  Does Patient Have a Medical Advance Directive? Yes  Type of Advance Directive Living will;Out of facility DNR (pink MOST or yellow form)  Does patient want to make changes to medical advance directive? No - Patient declined  Pre-existing out of facility DNR order (yellow form or pink MOST form) Pink MOST form placed in chart (order not valid for inpatient use);Yellow form placed in chart (order not valid for inpatient use)     Chief Complaint  Patient presents with   Medical Management of Chronic Issues    HPI:  Pt is a 81 y.o. female seen today for medical management of chronic diseases.    PMH significant for cerebral amyloid angiopathy with dementia, carotid artery stenosis, renal stones, UTI, PBA, DDD, TIA, HTN, IBS, and constipation. She is followed by hospice for dementia.   HTN: BP 14-160s  Dysphagia: on a puree diet with NTL, no current issues with coughing or choking.   Dementia: no issues with behaviors. Sleeping more. Not verbal. Did have a period of tearfulness early this week. No further episodes noted.  Doe have a hx of PBA. Periodic constipation, needs prn MOM.   Weight remains stable.   Wt Readings from Last 3 Encounters:  06/13/22 142 lb 3.2 oz (64.5 kg)  05/10/22 139 lb 3.2 oz (63.1 kg)  04/08/22 140 lb 8 oz (63.7 kg)    Past Medical History:  Diagnosis Date   Arthritis    back and neck   Carotid artery stenosis    Cerebral amyloid angiopathy (CODE) 06/20/2016   Chronic rhinosinusitis    DDD (degenerative disc disease)    Depression    Diverticulosis of colon    Frequency of urination    Glaucoma    Headache disorder 12/13/2014   Right temple   Hearing loss    History of kidney stones    MULTIBLE SURGICAL INTERVENTIONS   History of skin cancer HX TOPICAL FACIAL [place] 2013   History of TIA (transient ischemic attack)    NOV 2013-- NO RESIDUAL   Hypertension    IBS (irritable bowel syndrome)    Memory difficulty 05/14/2016   Memory loss    Mild asthma    COLD INDUCED   Right ureteral stone    Seasonal and perennial allergic rhinitis    Shingles    EYEBALL 09-17-2013   Spondylosis    Past Surgical History:  Procedure Laterality Date   BUNIONECTOMY     BILATERAL   CALDWELL LUC     sinus drainage procedure   CYSTO/ BILATERAL URETEROSCOPIC STONE EXTRACTIONS  02-09-2003   CYSTO/ BILATERAL URETEROSCOPY/  LASER OF STONES LEFT SIDE  06-02-2007   CYSTOSCOPY/RETROGRADE/URETEROSCOPY/STONE EXTRACTION WITH BASKET  03/31/2012  Procedure: CYSTOSCOPY/RETROGRADE/URETEROSCOPY/STONE EXTRACTION WITH BASKET;  Surgeon: Malka So, MD;  Location[facility];  Service: Urology;  Laterality: Right;    CYSTOSCOPY/RETROGRADE/URETEROSCOPY/STONE EXTRACTION WITH BASKET Right 03/29/2013   Procedure: CYSTOSCOPY//URETEROSCOPY/STONE EXTRACTION WITH BASKET;  Surgeon: Malka So, MD;  [facility];  Service: Urology;  Laterality: Right;   CYSTOSCOPY/RETROGRADE/URETEROSCOPY/STONE EXTRACTION WITH BASKET Right 10/14/2013   Procedure: CYSTOSCOPY/RETROGRADE/URETEROSCOPY/STONE  EXTRACTION WITH BASKET;  Surgeon: Irine Seal, MD;  Location: [facility];  Service: Urology;  Laterality: Right;   ESOPHAGOGASTRODUODENOSCOPY (EGD) WITH PROPOFOL N/A 04/12/2015   Procedure: ESOPHAGOGASTRODUODENOSCOPY (EGD) WITH PROPOFOL;  Surgeon: Arta Silence, MD;  [facility];  Service: Endoscopy;  Laterality: N/A;   EUS N/A 04/12/2015   Procedure: ESOPHAGEAL ENDOSCOPIC ULTRASOUND (EUS) RADIAL;  Surgeon: Arta Silence, MD;  [facility];  Service: Endoscopy;  Laterality: N/A;   EXTRACORPOREAL SHOCK WAVE LITHOTRIPSY  about 0109/ Dr Reece Agar   Never completed procedure-could not tolerate pain. Had seizures due to pain   EYE SURGERY  AGE 36   INJURY   HERNIA REPAIR  AGE 47   HOLMIUM LASER APPLICATION Right 12/07/5571   Procedure: HOLMIUM LASER APPLICATION;  Surgeon: Irine Seal, MD;  Location: [facility];  Service: Urology;  Laterality: Right;   LAPAROSCOPIC CHOLECYSTECTOMY  01-12-2005   LEFT URETEROSCOPIC STONE EXTRACTION    LAST ONE 04-28-2009   SEVERAL SURGICAL STONE EXTRACTIONS   LUMBAR LAMINECTOMY  09-30-2002   L4 - 5;  SPONDYLOLISTHISES W/ STENOSIS AND RADICULOPATHY   PERCUTANEOUS NEPHROSTOLITHOTOMY  1975   RIGHT URETEROSCOPIC STONE EXTRACTION  LAST ONE 02-21-2011   MULTIPLE SURGICAL STONE EXTRACTIONS   TONSILECTOMY, ADENOIDECTOMY, BILATERAL MYRINGOTOMY AND TUBES  CHILD   VAGINAL HYSTERECTOMY  1991   W/ BILATERAL SALPINGOOPHECTOMY    Allergies  Allergen Reactions   Donepezil Hcl    Ace Inhibitors Cough   Hctz [Hydrochlorothiazide]     Bladder problems   Triptans     Avoid per neuro    Outpatient Encounter Medications as of 06/13/2022  Medication Sig   DULoxetine (CYMBALTA) 30 MG capsule Take 30 mg by mouth daily.   OLANZapine (ZYPREXA) 2.5 MG tablet Take 2.5 mg by mouth at bedtime.   polyethylene glycol (MIRALAX / GLYCOLAX) 17 g packet Take 17 g by mouth daily. With 8 oz of fluid   sennosides-docusate sodium (SENOKOT-S)  8.6-50 MG tablet Take 2 tablets by mouth in the morning and at bedtime.   No facility-administered encounter medications on file as of 06/13/2022.    Review of Systems  Unable to perform ROS: Dementia    Immunization History  Administered Date(s) Administered   Hepatitis A 11/15/2003, 06/11/2004, 12/25/2012   Hepatitis B 11/14/2008, 12/15/2008   Influenza Split 06/17/2011   Influenza Whole 06/16/2010   Influenza, High Dose Seasonal PF 05/27/2016, 07/15/2019   Influenza,inj,Quad PF,6+ Mos 07/07/2018   Influenza-Unspecified 07/10/2017, 06/20/2021   Moderna Covid-19 Vaccine Bivalent Booster 54yr & up 06/27/2021   Moderna SARS-COV2 Booster Vaccination 07/26/2020, 05/08/2021   Moderna Sars-Covid-2 Vaccination 09/21/2019, 10/19/2019, 07/26/2020, 06/27/2021   Pneumococcal Conjugate-13 08/09/2014   Pneumococcal Polysaccharide-23 04/22/2011, 11/15/2011   Tdap 11/14/2008, 12/25/2012   Typhoid Inactivated 12/25/2012   Yellow Fever 12/25/2012   Zoster Recombinat (Shingrix) 01/03/2017, 04/04/2017   Zoster, Live 05/07/2012   Pertinent  Health Maintenance Due  Topic Date Due   INFLUENZA VACCINE  04/16/2022      10/29/2016    3:13 PM 01/21/2018   12:44 PM 11/17/2018    2:27 PM 10/26/2021  11:55 AM  Fall Risk  Falls in the past year? Yes Yes 1 1  Was there an injury with Fall? Yes No 0 0  Fall Risk Category Calculator   2 2  Fall Risk Category   Moderate Moderate  Patient Fall Risk Level   Moderate fall risk High fall risk  Patient at Risk for Falls Due to   History of fall(s);Impaired mobility;Medication side effect;Impaired balance/gait;Mental status change History of fall(s)  Fall risk Follow up Falls prevention discussed  Falls evaluation completed;Education provided;Falls prevention discussed Falls evaluation completed  Fall risk Follow up - Comments   with staff at [facility]:    Vitals:   06/13/22 1625  Weight: 142 lb 3.2 oz (64.5 kg)   Body mass  index is 26.01 kg/m. Physical Exam Vitals and nursing note reviewed.  Constitutional:      General: She is not in acute distress.    Appearance: She is not diaphoretic.  HENT:     Head: Normocephalic and atraumatic.  Neck:     Vascular: No JVD.  Cardiovascular:     Rate and Rhythm: Normal rate and regular rhythm.     Heart sounds: No murmur heard. Pulmonary:     Effort: Pulmonary effort is normal. No respiratory distress.     Breath sounds: Normal breath sounds. No wheezing.  Abdominal:     General: Bowel sounds are normal. There is no distension.     Palpations: Abdomen is soft.     Tenderness: There is no abdominal tenderness.  Musculoskeletal:     Right lower leg: No edema.  Skin:    General: Skin is warm and dry.  Neurological:     General: No focal deficit present.     Comments: Asleep difficult to arouse     Labs reviewed: No results for input(s): "NA", "K", "CL", "CO2", "GLUCOSE", "BUN", "CREATININE", "CALCIUM", "MG", "PHOS" in the last 8760 hours.  No results for input(s): "AST", "ALT", "ALKPHOS", "BILITOT", "PROT", "ALBUMIN" in the last 8760 hours. No results for input(s): "WBC", "NEUTROABS", "HGB", "HCT", "MCV", "PLT" in the last 8760 hours.  Lab Results  Component Value Date   TSH 1.93 02/01/2019   Lab Results  Component Value Date   HGBA1C 5.3 02/21/2020   Lab Results  Component Value Date   CHOL 183 11/14/2017   HDL 64 11/14/2017   LDLCALC 102 11/14/2017   TRIG 86 11/14/2017   CHOLHDL 2.9 07/15/2016    Significant Diagnostic Results in last 30 days:  No results found.  Assessment/Plan  1. Chronic dementia, with behavioral disturbance ([place]) Followed by hospice Continue zyprexa and cymbalta for behavior/agitation/anxiety   2. Cerebral amyloid angiopathy (CODE) Led to #1  3. TIA (transient ischemic attack) Off asa due to goals of care No new events/   4. Essential hypertension No longer on meds due to goals of care.   5. Slow transit  constipation Periodically needs prn med for constipation Continue prn senokot and miralax.   6. Oropharyngeal dysphagia Continue D 1 diet Asp prec No antibiotics per most form

## 2022-07-01 ENCOUNTER — Encounter: Payer: Self-pay | Admitting: Internal Medicine

## 2022-07-01 ENCOUNTER — Non-Acute Institutional Stay (SKILLED_NURSING_FACILITY): Admitting: Internal Medicine

## 2022-07-01 DIAGNOSIS — F03918 Unspecified dementia, unspecified severity, with other behavioral disturbance: Secondary | ICD-10-CM | POA: Diagnosis not present

## 2022-07-01 DIAGNOSIS — I680 Cerebral amyloid angiopathy: Secondary | ICD-10-CM | POA: Diagnosis not present

## 2022-07-01 DIAGNOSIS — R1312 Dysphagia, oropharyngeal phase: Secondary | ICD-10-CM | POA: Diagnosis not present

## 2022-07-01 NOTE — Progress Notes (Signed)
Location:   [place] Room Number: 125-A Place of Service:  SNF (31) Provider:  Sherre Lain    Patient Care Team: Royal Hawthorn, NP as PCP - General (Nurse Practitioner) Irine Seal, MD as Attending Physician (Urology) Jodi Marble, MD as Consulting Physician (Otolaryngology) Martinique, Amy, MD as Consulting Physician (Dermatology) Arta Silence, MD as Consulting Physician (Gastroenterology) Dohmeier, Asencion Partridge, MD as Consulting Physician (Neurology) Geanie Kenning, MD as Consulting Physician (Psychiatry)  Extended Emergency Contact Information Primary Emergency Contact: [name] Address: [address]          [place], [place] [address] [name] of [place] Phone: [phone] Mobile Phone: [phone] Relation: Spouse  Code Status:  DNR/hospice Goals of care: Advanced Directive information    07/01/2022   11:52 AM  Advanced Directives  Does Patient Have a Medical Advance Directive? Yes  Type of Advance Directive Living will;Out of facility DNR (pink MOST or yellow form);Healthcare Power of Attorney  Does patient want to make changes to medical advance directive? No - Patient declined  Copy of [place] in Chart? Yes - validated most recent copy scanned in chart (See row information)     Chief Complaint  Patient presents with   Medical Management of Chronic Issues    Routine Visit.   Immunizations    Discuss the need for Influenza vaccine.    HPI:  Pt is a 81 y.o. female seen today for medical management of chronic diseases.    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 No new Nursing issues. No Behavior issues Her weight is stable Hoyer lift dependent Needs help with Feeding Does not respond or follow commands No Falls Wt Readings from Last 3 Encounters:  07/01/22 145 lb 3.2 oz  (65.9 kg)  06/13/22 142 lb 3.2 oz (64.5 kg)  05/10/22 139 lb 3.2 oz (63.1 kg)   Past Medical History:  Diagnosis Date   Arthritis    back and neck   Carotid artery stenosis    Cerebral amyloid angiopathy (CODE) 06/20/2016   Chronic rhinosinusitis    DDD (degenerative disc disease)    Depression    Diverticulosis of colon    Frequency of urination    Glaucoma    Headache disorder 12/13/2014   Right temple   Hearing loss    History of kidney stones    MULTIBLE SURGICAL INTERVENTIONS   History of skin cancer HX TOPICAL FACIAL [place] 2013   History of TIA (transient ischemic attack)    NOV 2013-- NO RESIDUAL   Hypertension    IBS (irritable bowel syndrome)    Memory difficulty 05/14/2016   Memory loss    Mild asthma    COLD INDUCED   Right ureteral stone    Seasonal and perennial allergic rhinitis    Shingles    EYEBALL 09-17-2013   Spondylosis    Past Surgical History:  Procedure Laterality Date   BUNIONECTOMY     BILATERAL   CALDWELL LUC     sinus drainage procedure   CYSTO/ BILATERAL URETEROSCOPIC STONE EXTRACTIONS  02-09-2003   CYSTO/ BILATERAL URETEROSCOPY/  LASER OF STONES LEFT SIDE  06-02-2007   CYSTOSCOPY/RETROGRADE/URETEROSCOPY/STONE EXTRACTION WITH BASKET  03/31/2012   Procedure: CYSTOSCOPY/RETROGRADE/URETEROSCOPY/STONE EXTRACTION WITH BASKET;  Surgeon: Malka So, MD;  Location: [facility];  Service: Urology;  Laterality: Right;    CYSTOSCOPY/RETROGRADE/URETEROSCOPY/STONE EXTRACTION  WITH BASKET Right 03/29/2013   Procedure: CYSTOSCOPY//URETEROSCOPY/STONE EXTRACTION WITH BASKET;  Surgeon: Malka So, MD;  Location: [facility];  Service: Urology;  Laterality: Right;   CYSTOSCOPY/RETROGRADE/URETEROSCOPY/STONE EXTRACTION WITH BASKET Right 10/14/2013   Procedure: CYSTOSCOPY/RETROGRADE/URETEROSCOPY/STONE EXTRACTION WITH BASKET;  Surgeon: Irine Seal, MD;  [facility];  Service: Urology;  Laterality: Right;    ESOPHAGOGASTRODUODENOSCOPY (EGD) WITH PROPOFOL N/A 04/12/2015   Procedure: ESOPHAGOGASTRODUODENOSCOPY (EGD) WITH PROPOFOL;  Surgeon: Arta Silence, MD;  Location: [facility];  Service: Endoscopy;  Laterality: N/A;   EUS N/A 04/12/2015   Procedure: ESOPHAGEAL ENDOSCOPIC ULTRASOUND (EUS) RADIAL;  Surgeon: Arta Silence, MD;  Location: [facility];  Service: Endoscopy;  Laterality: N/A;   EXTRACORPOREAL SHOCK WAVE LITHOTRIPSY  about 7425/ Dr Reece Agar   Never completed procedure-could not tolerate pain. Had seizures due to pain   EYE SURGERY  AGE 72   INJURY   HERNIA REPAIR  AGE 89   HOLMIUM LASER APPLICATION Right 9/56/3875   Procedure: HOLMIUM LASER APPLICATION;  Surgeon: Irine Seal, MD;  Location[facility];  Service: Urology;  Laterality: Right;   LAPAROSCOPIC CHOLECYSTECTOMY  01-12-2005   LEFT URETEROSCOPIC STONE EXTRACTION    LAST ONE 04-28-2009   SEVERAL SURGICAL STONE EXTRACTIONS   LUMBAR LAMINECTOMY  09-30-2002   L4 - 5;  SPONDYLOLISTHISES W/ STENOSIS AND RADICULOPATHY   PERCUTANEOUS NEPHROSTOLITHOTOMY  1975   RIGHT URETEROSCOPIC STONE EXTRACTION  LAST ONE 02-21-2011   MULTIPLE SURGICAL STONE EXTRACTIONS   TONSILECTOMY, ADENOIDECTOMY, BILATERAL MYRINGOTOMY AND TUBES  CHILD   VAGINAL HYSTERECTOMY  1991   W/ BILATERAL SALPINGOOPHECTOMY    Allergies  Allergen Reactions   Donepezil Hcl    Ace Inhibitors Cough   Hctz [Hydrochlorothiazide]     Bladder problems   Triptans     Avoid per neuro    Allergies as of 07/01/2022       Reactions   Donepezil Hcl    Ace Inhibitors Cough   Hctz [hydrochlorothiazide]    Bladder problems   Triptans    Avoid per neuro        Medication List        Accurate as of July 01, 2022 11:52 AM. If you have any questions, ask your nurse or doctor.          DULoxetine 30 MG capsule Commonly known as: CYMBALTA Take 30 mg by mouth daily.   OLANZapine 2.5 MG tablet Commonly known as: ZYPREXA Take 2.5 mg by mouth  at bedtime.   polyethylene glycol 17 g packet Commonly known as: MIRALAX / GLYCOLAX Take 17 g by mouth daily. With 8 oz of fluid   sennosides-docusate sodium 8.6-50 MG tablet Commonly known as: SENOKOT-S Take 2 tablets by mouth in the morning and at bedtime.        Review of Systems  Unable to perform ROS: Dementia    Immunization History  Administered Date(s) Administered   Hepatitis A 11/15/2003, 06/11/2004, 12/25/2012   Hepatitis B 11/14/2008, 12/15/2008   Influenza Split 06/17/2011   Influenza Whole 06/16/2010   Influenza, High Dose Seasonal PF 05/27/2016, 07/15/2019   Influenza,inj,Quad PF,6+ Mos 07/07/2018   Influenza-Unspecified 07/10/2017, 06/20/2021   Moderna Covid-19 Vaccine Bivalent Booster 4yr & up 06/27/2021   Moderna SARS-COV2 Booster Vaccination 07/26/2020, 05/08/2021   Moderna Sars-Covid-2 Vaccination 09/21/2019, 10/19/2019, 07/26/2020, 06/27/2021   Pneumococcal Conjugate-13 08/09/2014   Pneumococcal Polysaccharide-23 04/22/2011, 11/15/2011   Tdap 11/14/2008, 12/25/2012   Typhoid Inactivated 12/25/2012   Yellow Fever 12/25/2012   Zoster Recombinat (Shingrix)  01/03/2017, 04/04/2017   Zoster, Live 05/07/2012   Pertinent  Health Maintenance Due  Topic Date Due   INFLUENZA VACCINE  04/16/2022      10/29/2016    3:13 PM 01/21/2018   12:44 PM 11/17/2018    2:27 PM 10/26/2021   11:55 AM  Fall Risk  Falls in the past year? Yes Yes 1 1  Was there an injury with Fall? Yes No 0 0  Fall Risk Category Calculator   2 2  Fall Risk Category   Moderate Moderate  Patient Fall Risk Level   Moderate fall risk High fall risk  Patient at Risk for Falls Due to   History of fall(s);Impaired mobility;Medication side effect;Impaired balance/gait;Mental status change History of fall(s)  Fall risk Follow up Falls prevention discussed  Falls evaluation completed;Education provided;Falls prevention discussed Falls evaluation completed  Fall risk Follow up - Comments   with staff  at [facility]:    Vitals:   07/01/22 1150  BP: (!) 148/82  Pulse: 74  Resp: 16  Temp: (!) 97 F (36.1 C)  SpO2: 94%  Weight: 145 lb 3.2 oz (65.9 kg)  Height: '5\' 2"'$  (1.575 m)   Body mass index is 26.56 kg/m. Physical Exam Vitals reviewed.  Constitutional:      Appearance: Normal appearance.  HENT:     Head: Normocephalic.     Nose: Nose normal.     Mouth/Throat:     Mouth: Mucous membranes are moist.     Pharynx: Oropharynx is clear.  Eyes:     Pupils: Pupils are equal, round, and reactive to light.  Cardiovascular:     Rate and Rhythm: Normal rate and regular rhythm.     Pulses: Normal pulses.     Heart sounds: Normal heart sounds. No murmur heard. Pulmonary:     Effort: Pulmonary effort is normal.     Breath sounds: Normal breath sounds.  Abdominal:     General: Abdomen is flat. Bowel sounds are normal.     Palpations: Abdomen is soft.  Musculoskeletal:        General: No swelling.     Cervical back: Neck supple.  Skin:    General: Skin is warm.  Neurological:     General: No focal deficit present.     Mental Status: She is alert.  Psychiatric:        Mood and Affect: Mood normal.        Thought Content: Thought content normal.     Labs reviewed: No results for input(s): "NA", "K", "CL", "CO2", "GLUCOSE", "BUN", "CREATININE", "CALCIUM", "MG", "PHOS" in the last 8760 hours. No results for input(s): "AST", "ALT", "ALKPHOS", "BILITOT", "PROT", "ALBUMIN" in the last 8760 hours. No results for input(s): "WBC", "NEUTROABS", "HGB", "HCT", "MCV", "PLT" in the last 8760 hours. Lab Results  Component Value Date   TSH 1.93 02/01/2019   Lab Results  Component Value Date   HGBA1C 5.3 02/21/2020   Lab Results  Component Value Date   CHOL 183 11/14/2017   HDL 64 11/14/2017   LDLCALC 102 11/14/2017   TRIG 86 11/14/2017   CHOLHDL 2.9 07/15/2016    Significant Diagnostic Results in last 30 days:  No results  found.  Assessment/Plan 1. Cerebral amyloid angiopathy (CODE) Enrolled in Hospice  2. Chronic dementia, with behavioral disturbance ([place]) Zyprexa and Cymbalta for behaviors Failed GDR  3. Oropharyngeal dysphagia On Puree diet  Per MOST form no antibiotics    Family/ staff  Communication:   Labs/tests ordered:

## 2022-08-12 ENCOUNTER — Encounter: Payer: Self-pay | Admitting: Adult Health

## 2022-08-12 ENCOUNTER — Non-Acute Institutional Stay (SKILLED_NURSING_FACILITY): Admitting: Adult Health

## 2022-08-12 DIAGNOSIS — R1312 Dysphagia, oropharyngeal phase: Secondary | ICD-10-CM | POA: Diagnosis not present

## 2022-08-12 DIAGNOSIS — R29898 Other symptoms and signs involving the musculoskeletal system: Secondary | ICD-10-CM | POA: Diagnosis not present

## 2022-08-12 DIAGNOSIS — I680 Cerebral amyloid angiopathy: Secondary | ICD-10-CM

## 2022-08-12 DIAGNOSIS — E210 Primary hyperparathyroidism: Secondary | ICD-10-CM | POA: Diagnosis not present

## 2022-08-12 DIAGNOSIS — G459 Transient cerebral ischemic attack, unspecified: Secondary | ICD-10-CM | POA: Diagnosis not present

## 2022-08-12 DIAGNOSIS — F03918 Unspecified dementia, unspecified severity, with other behavioral disturbance: Secondary | ICD-10-CM

## 2022-08-12 DIAGNOSIS — I10 Essential (primary) hypertension: Secondary | ICD-10-CM | POA: Diagnosis not present

## 2022-08-12 DIAGNOSIS — K5901 Slow transit constipation: Secondary | ICD-10-CM

## 2022-08-12 NOTE — Progress Notes (Signed)
Location:  [facility] of Service:  SNF (31) Provider:  Royal Hawthorn, NP  Patient Care Team: Royal Hawthorn, NP as PCP - General (Nurse Practitioner) Irine Seal, MD as Attending Physician (Urology) Jodi Marble, MD as Consulting Physician (Otolaryngology) Martinique, Amy, MD as Consulting Physician (Dermatology) Arta Silence, MD as Consulting Physician (Gastroenterology) Dohmeier, Asencion Partridge, MD as Consulting Physician (Neurology) Geanie Kenning, MD as Consulting Physician (Psychiatry)  Extended Emergency Contact Information Primary Emergency Contact: [name] Address: [address]          [place], [place] [address] [name] of [place] Phone: [phone] Mobile Phone: [phone] Relation: Spouse  Code Status:  DNR Goals of care: Advanced Directive information    07/01/2022   11:52 AM  Advanced Directives  Does Patient Have a Medical Advance Directive? Yes  Type of Advance Directive Living will;Out of facility DNR (pink MOST or yellow form);Healthcare Power of Attorney  Does patient want to make changes to medical advance directive? No - Patient declined  Copy of [place] in Chart? Yes - validated most recent copy scanned in chart (See row information)     Chief Complaint  Patient presents with   Medical Management of Chronic Issues    HPI:  Pt is a 81 y.o. female seen today for medical management of chronic diseases.    PMH significant for cerebral amyloid angiopathy with dementia, carotid artery stenosis, renal stones, UTI, PBA, DDD, TIA, HTN, IBS, and constipation. She is followed by hospice for dementia.   Discussed care with nurse, no new issues. Sits in [place] chair in the living room. Does not engage or make verbalizations. Does not appear to be in pain.   HTN: SBP in the 140s. No edema.   Dysphagia: on a puree diet with NTL, no current issues with coughing or choking.   Dementia: severe,  needs help with all ADLs, incontinent FAST stage 7.  Hoyer lift for transfers.   Weight remains stable.   Wt Readings from Last 3 Encounters:  08/12/22 143 lb 9.6 oz (65.1 kg)  07/01/22 145 lb 3.2 oz (65.9 kg)  06/13/22 142 lb 3.2 oz (64.5 kg)    Past Medical History:  Diagnosis Date   Arthritis    back and neck   Carotid artery stenosis    Cerebral amyloid angiopathy (CODE) 06/20/2016   Chronic rhinosinusitis    DDD (degenerative disc disease)    Depression    Diverticulosis of colon    Frequency of urination    Glaucoma    Headache disorder 12/13/2014   Right temple   Hearing loss    History of kidney stones    MULTIBLE SURGICAL INTERVENTIONS   History of skin cancer HX TOPICAL FACIAL [place] 2013   History of TIA (transient ischemic attack)    NOV 2013-- NO RESIDUAL   Hypertension    IBS (irritable bowel syndrome)    Memory difficulty 05/14/2016   Memory loss    Mild asthma    COLD INDUCED   Right ureteral stone    Seasonal and perennial allergic rhinitis    Shingles    EYEBALL 09-17-2013   Spondylosis    Past Surgical History:  Procedure Laterality Date   BUNIONECTOMY     BILATERAL   CALDWELL LUC     sinus drainage procedure   CYSTO/ BILATERAL URETEROSCOPIC STONE EXTRACTIONS  02-09-2003   CYSTO/ BILATERAL URETEROSCOPY/  LASER OF STONES LEFT SIDE  06-02-2007   CYSTOSCOPY/RETROGRADE/URETEROSCOPY/STONE EXTRACTION  WITH BASKET  03/31/2012   Procedure: CYSTOSCOPY/RETROGRADE/URETEROSCOPY/STONE EXTRACTION WITH BASKET;  Surgeon: Malka So, MD;  Location: [facility];  Service: Urology;  Laterality: Right;    CYSTOSCOPY/RETROGRADE/URETEROSCOPY/STONE EXTRACTION WITH BASKET Right 03/29/2013   Procedure: CYSTOSCOPY//URETEROSCOPY/STONE EXTRACTION WITH BASKET;  Surgeon: Malka So, MD;  Location: [facility];  Service: Urology;  Laterality: Right;   CYSTOSCOPY/RETROGRADE/URETEROSCOPY/STONE EXTRACTION WITH BASKET Right 10/14/2013   Procedure:  CYSTOSCOPY/RETROGRADE/URETEROSCOPY/STONE EXTRACTION WITH BASKET;  Surgeon: Irine Seal, MD;  Location: [facility];  Service: Urology;  Laterality: Right;   ESOPHAGOGASTRODUODENOSCOPY (EGD) WITH PROPOFOL N/A 04/12/2015   Procedure: ESOPHAGOGASTRODUODENOSCOPY (EGD) WITH PROPOFOL;  Surgeon: Arta Silence, MD;  Location: [facility];  Service: Endoscopy;  Laterality: N/A;   EUS N/A 04/12/2015   Procedure: ESOPHAGEAL ENDOSCOPIC ULTRASOUND (EUS) RADIAL;  Surgeon: Arta Silence, MD;  Location: [facility];  Service: Endoscopy;  Laterality: N/A;   EXTRACORPOREAL SHOCK WAVE LITHOTRIPSY  about 9562/ Dr Reece Agar   Never completed procedure-could not tolerate pain. Had seizures due to pain   EYE SURGERY  AGE 89   INJURY   HERNIA REPAIR  AGE 72   HOLMIUM LASER APPLICATION Right 10/16/8655   Procedure: HOLMIUM LASER APPLICATION;  Surgeon: Irine Seal, MD;  Location[facility];  Service: Urology;  Laterality: Right;   LAPAROSCOPIC CHOLECYSTECTOMY  01-12-2005   LEFT URETEROSCOPIC STONE EXTRACTION    LAST ONE 04-28-2009   SEVERAL SURGICAL STONE EXTRACTIONS   LUMBAR LAMINECTOMY  09-30-2002   L4 - 5;  SPONDYLOLISTHISES W/ STENOSIS AND RADICULOPATHY   PERCUTANEOUS NEPHROSTOLITHOTOMY  1975   RIGHT URETEROSCOPIC STONE EXTRACTION  LAST ONE 02-21-2011   MULTIPLE SURGICAL STONE EXTRACTIONS   TONSILECTOMY, ADENOIDECTOMY, BILATERAL MYRINGOTOMY AND TUBES  CHILD   VAGINAL HYSTERECTOMY  1991   W/ BILATERAL SALPINGOOPHECTOMY    Allergies  Allergen Reactions   Donepezil Hcl    Ace Inhibitors Cough   Hctz [Hydrochlorothiazide]     Bladder problems   Triptans     Avoid per neuro    Outpatient Encounter Medications as of 08/12/2022  Medication Sig   DULoxetine (CYMBALTA) 30 MG capsule Take 30 mg by mouth daily.   OLANZapine (ZYPREXA) 2.5 MG tablet Take 2.5 mg by mouth at bedtime.   polyethylene glycol (MIRALAX / GLYCOLAX) 17 g packet Take 17 g by mouth daily. With 8 oz of fluid    sennosides-docusate sodium (SENOKOT-S) 8.6-50 MG tablet Take 2 tablets by mouth in the morning and at bedtime.   No facility-administered encounter medications on file as of 08/12/2022.    Review of Systems  Unable to perform ROS: Dementia    Immunization History  Administered Date(s) Administered   Hepatitis A 11/15/2003, 06/11/2004, 12/25/2012   Hepatitis B 11/14/2008, 12/15/2008   Influenza Split 06/17/2011   Influenza Whole 06/16/2010   Influenza, High Dose Seasonal PF 05/27/2016, 07/15/2019   Influenza,inj,Quad PF,6+ Mos 07/07/2018   Influenza-Unspecified 07/10/2017, 06/20/2021   Moderna Covid-19 Vaccine Bivalent Booster 50yr & up 06/27/2021   Moderna SARS-COV2 Booster Vaccination 07/26/2020, 05/08/2021   Moderna Sars-Covid-2 Vaccination 09/21/2019, 10/19/2019, 07/26/2020, 06/27/2021   Pneumococcal Conjugate-13 08/09/2014   Pneumococcal Polysaccharide-23 04/22/2011, 11/15/2011   Tdap 11/14/2008, 12/25/2012   Typhoid Inactivated 12/25/2012   Yellow Fever 12/25/2012   Zoster Recombinat (Shingrix) 01/03/2017, 04/04/2017   Zoster, Live 05/07/2012   Pertinent  Health Maintenance Due  Topic Date Due   INFLUENZA VACCINE  04/16/2022      10/29/2016    3:13 PM 01/21/2018   12:44 PM 11/17/2018  2:27 PM 10/26/2021   11:55 AM  Fall Risk  Falls in the past year? Yes Yes 1 1  Was there an injury with Fall? Yes No 0 0  Fall Risk Category Calculator   2 2  Fall Risk Category   Moderate Moderate  Patient Fall Risk Level   Moderate fall risk High fall risk  Patient at Risk for Falls Due to   History of fall(s);Impaired mobility;Medication side effect;Impaired balance/gait;Mental status change History of fall(s)  Fall risk Follow up Falls prevention discussed  Falls evaluation completed;Education provided;Falls prevention discussed Falls evaluation completed  Fall risk Follow up - Comments   with staff at [facility]:    Vitals:   08/12/22 1819   Weight: 143 lb 9.6 oz (65.1 kg)   Body mass index is 26.26 kg/m. Physical Exam Vitals and nursing note reviewed.  Constitutional:      General: She is not in acute distress.    Appearance: She is not diaphoretic.  HENT:     Head: Normocephalic and atraumatic.  Eyes:     Conjunctiva/sclera: Conjunctivae normal.     Pupils: Pupils are equal, round, and reactive to light.  Neck:     Vascular: No JVD.  Cardiovascular:     Rate and Rhythm: Normal rate and regular rhythm.     Heart sounds: No murmur heard. Pulmonary:     Effort: Pulmonary effort is normal. No respiratory distress.     Breath sounds: Normal breath sounds. No wheezing.  Abdominal:     General: Bowel sounds are normal. There is no distension.     Palpations: Abdomen is soft.     Tenderness: There is no abdominal tenderness.  Musculoskeletal:     Right lower leg: No edema.  Skin:    General: Skin is warm and dry.  Neurological:     General: No focal deficit present.     Comments: Decreased ROM with rigidity to BUE and BLE.   Psychiatric:        Mood and Affect: Mood normal.     Labs reviewed: No results for input(s): "NA", "K", "CL", "CO2", "GLUCOSE", "BUN", "CREATININE", "CALCIUM", "MG", "PHOS" in the last 8760 hours.  No results for input(s): "AST", "ALT", "ALKPHOS", "BILITOT", "PROT", "ALBUMIN" in the last 8760 hours. No results for input(s): "WBC", "NEUTROABS", "HGB", "HCT", "MCV", "PLT" in the last 8760 hours.  Lab Results  Component Value Date   TSH 1.93 02/01/2019   Lab Results  Component Value Date   HGBA1C 5.3 02/21/2020   Lab Results  Component Value Date   CHOL 183 11/14/2017   HDL 64 11/14/2017   LDLCALC 102 11/14/2017   TRIG 86 11/14/2017   CHOLHDL 2.9 07/15/2016    Significant Diagnostic Results in last 30 days:  No results found.  Assessment/Plan  1. Chronic dementia, with behavioral disturbance ([place]) Followed by hospice Continue zyprexa and cymbalta for  behavior/agitation/anxiety   2. Cerebral amyloid angiopathy (CODE) Led to #1  3. TIA (transient ischemic attack) Off asa due to goals of care No new events  4. Essential hypertension No longer on meds due to goals of care.   5. Slow transit constipation Continue prn senokot and miralax.   6. Oropharyngeal dysphagia Continue D 1 diet Asp prec No antibiotics per most form  7. Hyperparathyroidism Ca 9.6 PTH 57.82 03/12/21 No longer monitoring due to goals of care No new issues  8. Rigidity Increase rigidity noted, does not appear to be  in pain.  Could be due to immoblity, also zyprexa. Will continue to monitor.

## 2022-10-17 DEATH — deceased
